# Patient Record
Sex: Female | Born: 1940 | Race: Black or African American | Hispanic: No | Smoking: Never smoker
Health system: Southern US, Community
[De-identification: ages and names within clinical notes are randomized; demographics above are authoritative.]

## PROBLEM LIST (undated history)

## (undated) DIAGNOSIS — E039 Hypothyroidism, unspecified: Secondary | ICD-10-CM

## (undated) DIAGNOSIS — N189 Chronic kidney disease, unspecified: Secondary | ICD-10-CM

## (undated) DIAGNOSIS — F32A Depression, unspecified: Secondary | ICD-10-CM

## (undated) DIAGNOSIS — I10 Essential (primary) hypertension: Secondary | ICD-10-CM

## (undated) DIAGNOSIS — G8929 Other chronic pain: Secondary | ICD-10-CM

## (undated) DIAGNOSIS — K219 Gastro-esophageal reflux disease without esophagitis: Secondary | ICD-10-CM

## (undated) DIAGNOSIS — R011 Cardiac murmur, unspecified: Secondary | ICD-10-CM

## (undated) DIAGNOSIS — E119 Type 2 diabetes mellitus without complications: Secondary | ICD-10-CM

## (undated) DIAGNOSIS — E785 Hyperlipidemia, unspecified: Secondary | ICD-10-CM

## (undated) DIAGNOSIS — M549 Dorsalgia, unspecified: Secondary | ICD-10-CM

## (undated) DIAGNOSIS — H269 Unspecified cataract: Secondary | ICD-10-CM

## (undated) DIAGNOSIS — D649 Anemia, unspecified: Secondary | ICD-10-CM

## (undated) DIAGNOSIS — M1990 Unspecified osteoarthritis, unspecified site: Secondary | ICD-10-CM

## (undated) DIAGNOSIS — F329 Major depressive disorder, single episode, unspecified: Secondary | ICD-10-CM

## (undated) DIAGNOSIS — R7303 Prediabetes: Secondary | ICD-10-CM

## (undated) HISTORY — DX: Type 2 diabetes mellitus without complications: E11.9

## (undated) HISTORY — PX: TOTAL ABDOMINAL HYSTERECTOMY: SHX209

## (undated) HISTORY — DX: Unspecified cataract: H26.9

## (undated) HISTORY — DX: Essential (primary) hypertension: I10

## (undated) HISTORY — DX: Hyperlipidemia, unspecified: E78.5

## (undated) HISTORY — DX: Dorsalgia, unspecified: M54.9

## (undated) HISTORY — DX: Major depressive disorder, single episode, unspecified: F32.9

## (undated) HISTORY — DX: Depression, unspecified: F32.A

## (undated) HISTORY — DX: Anemia, unspecified: D64.9

## (undated) HISTORY — PX: SPINE SURGERY: SHX786

## (undated) HISTORY — DX: Hypothyroidism, unspecified: E03.9

## (undated) HISTORY — DX: Gastro-esophageal reflux disease without esophagitis: K21.9

## (undated) HISTORY — DX: Other chronic pain: G89.29

---

## 1979-02-27 HISTORY — PX: LAMINECTOMY: SHX219

## 1998-10-24 ENCOUNTER — Emergency Department (HOSPITAL_COMMUNITY): Admission: EM | Admit: 1998-10-24 | Discharge: 1998-10-24 | Payer: Self-pay | Admitting: Emergency Medicine

## 1998-10-24 ENCOUNTER — Encounter: Payer: Self-pay | Admitting: Emergency Medicine

## 1998-11-29 ENCOUNTER — Ambulatory Visit (HOSPITAL_COMMUNITY): Admission: RE | Admit: 1998-11-29 | Discharge: 1998-11-29 | Payer: Self-pay

## 1998-11-29 ENCOUNTER — Encounter: Payer: Self-pay | Admitting: Neurosurgery

## 2000-12-23 ENCOUNTER — Ambulatory Visit (HOSPITAL_COMMUNITY): Admission: RE | Admit: 2000-12-23 | Discharge: 2000-12-23 | Payer: Self-pay | Admitting: Family Medicine

## 2000-12-23 ENCOUNTER — Encounter: Payer: Self-pay | Admitting: Family Medicine

## 2001-05-03 ENCOUNTER — Other Ambulatory Visit: Admission: RE | Admit: 2001-05-03 | Discharge: 2001-05-03 | Payer: Self-pay | Admitting: Family Medicine

## 2001-12-25 ENCOUNTER — Encounter: Payer: Self-pay | Admitting: Family Medicine

## 2001-12-25 ENCOUNTER — Ambulatory Visit (HOSPITAL_COMMUNITY): Admission: RE | Admit: 2001-12-25 | Discharge: 2001-12-25 | Payer: Self-pay | Admitting: Family Medicine

## 2002-02-13 ENCOUNTER — Ambulatory Visit (HOSPITAL_COMMUNITY): Admission: RE | Admit: 2002-02-13 | Discharge: 2002-02-13 | Payer: Self-pay | Admitting: Endocrinology

## 2002-02-21 ENCOUNTER — Ambulatory Visit (HOSPITAL_COMMUNITY): Admission: RE | Admit: 2002-02-21 | Discharge: 2002-02-21 | Payer: Self-pay | Admitting: General Surgery

## 2002-03-12 ENCOUNTER — Encounter (HOSPITAL_COMMUNITY): Admission: RE | Admit: 2002-03-12 | Discharge: 2002-04-11 | Payer: Self-pay | Admitting: Endocrinology

## 2002-05-28 ENCOUNTER — Ambulatory Visit (HOSPITAL_COMMUNITY): Admission: RE | Admit: 2002-05-28 | Discharge: 2002-05-28 | Payer: Self-pay | Admitting: Cardiology

## 2002-05-28 ENCOUNTER — Encounter (HOSPITAL_COMMUNITY): Admission: RE | Admit: 2002-05-28 | Discharge: 2002-06-27 | Payer: Self-pay | Admitting: Cardiology

## 2002-10-03 ENCOUNTER — Ambulatory Visit (HOSPITAL_COMMUNITY): Admission: RE | Admit: 2002-10-03 | Discharge: 2002-10-03 | Payer: Self-pay | Admitting: Orthopaedic Surgery

## 2002-10-03 ENCOUNTER — Encounter: Payer: Self-pay | Admitting: Orthopaedic Surgery

## 2002-12-26 ENCOUNTER — Ambulatory Visit (HOSPITAL_COMMUNITY): Admission: RE | Admit: 2002-12-26 | Discharge: 2002-12-26 | Payer: Self-pay | Admitting: Family Medicine

## 2002-12-26 ENCOUNTER — Encounter: Payer: Self-pay | Admitting: Family Medicine

## 2003-12-27 ENCOUNTER — Ambulatory Visit (HOSPITAL_COMMUNITY): Admission: RE | Admit: 2003-12-27 | Discharge: 2003-12-27 | Payer: Self-pay | Admitting: Family Medicine

## 2004-06-10 ENCOUNTER — Ambulatory Visit: Payer: Self-pay | Admitting: Family Medicine

## 2004-10-08 ENCOUNTER — Ambulatory Visit: Payer: Self-pay | Admitting: Family Medicine

## 2005-01-18 ENCOUNTER — Ambulatory Visit: Payer: Self-pay | Admitting: Family Medicine

## 2005-01-21 ENCOUNTER — Ambulatory Visit (HOSPITAL_COMMUNITY): Admission: RE | Admit: 2005-01-21 | Discharge: 2005-01-21 | Payer: Self-pay | Admitting: Family Medicine

## 2005-05-25 ENCOUNTER — Ambulatory Visit: Payer: Self-pay | Admitting: Family Medicine

## 2005-10-12 ENCOUNTER — Ambulatory Visit: Payer: Self-pay | Admitting: Family Medicine

## 2005-10-12 ENCOUNTER — Other Ambulatory Visit: Admission: RE | Admit: 2005-10-12 | Discharge: 2005-10-12 | Payer: Self-pay | Admitting: Family Medicine

## 2005-12-20 ENCOUNTER — Ambulatory Visit: Payer: Self-pay | Admitting: Family Medicine

## 2006-01-18 ENCOUNTER — Ambulatory Visit: Payer: Self-pay | Admitting: Internal Medicine

## 2006-01-24 ENCOUNTER — Ambulatory Visit (HOSPITAL_COMMUNITY): Admission: RE | Admit: 2006-01-24 | Discharge: 2006-01-24 | Payer: Self-pay | Admitting: Family Medicine

## 2006-02-04 ENCOUNTER — Ambulatory Visit: Payer: Self-pay | Admitting: Internal Medicine

## 2006-02-04 ENCOUNTER — Ambulatory Visit (HOSPITAL_COMMUNITY): Admission: RE | Admit: 2006-02-04 | Discharge: 2006-02-04 | Payer: Self-pay | Admitting: Internal Medicine

## 2006-02-07 ENCOUNTER — Ambulatory Visit (HOSPITAL_COMMUNITY): Admission: RE | Admit: 2006-02-07 | Discharge: 2006-02-07 | Payer: Self-pay | Admitting: Internal Medicine

## 2006-04-25 ENCOUNTER — Ambulatory Visit: Payer: Self-pay | Admitting: Family Medicine

## 2006-09-19 ENCOUNTER — Ambulatory Visit: Payer: Self-pay | Admitting: Family Medicine

## 2006-09-28 ENCOUNTER — Ambulatory Visit (HOSPITAL_COMMUNITY): Admission: RE | Admit: 2006-09-28 | Discharge: 2006-09-28 | Payer: Self-pay | Admitting: Family Medicine

## 2007-01-30 ENCOUNTER — Ambulatory Visit (HOSPITAL_COMMUNITY): Admission: RE | Admit: 2007-01-30 | Discharge: 2007-01-30 | Payer: Self-pay | Admitting: Family Medicine

## 2007-03-16 ENCOUNTER — Ambulatory Visit: Payer: Self-pay | Admitting: Family Medicine

## 2007-03-16 ENCOUNTER — Encounter: Payer: Self-pay | Admitting: Family Medicine

## 2007-03-16 ENCOUNTER — Other Ambulatory Visit: Admission: RE | Admit: 2007-03-16 | Discharge: 2007-03-16 | Payer: Self-pay | Admitting: Family Medicine

## 2007-06-28 ENCOUNTER — Ambulatory Visit: Payer: Self-pay | Admitting: Family Medicine

## 2007-06-29 ENCOUNTER — Encounter: Payer: Self-pay | Admitting: Family Medicine

## 2007-11-16 ENCOUNTER — Encounter: Payer: Self-pay | Admitting: Family Medicine

## 2007-11-16 LAB — CONVERTED CEMR LAB
CO2: 21 meq/L (ref 19–32)
Calcium: 9.6 mg/dL (ref 8.4–10.5)
Creatinine, Ser: 1.4 mg/dL — ABNORMAL HIGH (ref 0.40–1.20)
Eosinophils Relative: 5 % (ref 0–5)
Glucose, Bld: 85 mg/dL (ref 70–99)
HCT: 36 % (ref 36.0–46.0)
Hemoglobin: 11.4 g/dL — ABNORMAL LOW (ref 12.0–15.0)
Lymphocytes Relative: 42 % (ref 12–46)
Lymphs Abs: 1.8 10*3/uL (ref 0.7–4.0)
MCV: 76.9 fL — ABNORMAL LOW (ref 78.0–100.0)
Monocytes Absolute: 0.3 10*3/uL (ref 0.1–1.0)
RDW: 16 % — ABNORMAL HIGH (ref 11.5–15.5)
TSH: 5.374 microintl units/mL (ref 0.350–5.50)

## 2007-11-28 ENCOUNTER — Ambulatory Visit: Payer: Self-pay | Admitting: Family Medicine

## 2008-01-31 ENCOUNTER — Encounter: Payer: Self-pay | Admitting: Family Medicine

## 2008-02-20 ENCOUNTER — Ambulatory Visit (HOSPITAL_COMMUNITY): Admission: RE | Admit: 2008-02-20 | Discharge: 2008-02-20 | Payer: Self-pay | Admitting: Family Medicine

## 2008-03-25 ENCOUNTER — Encounter: Payer: Self-pay | Admitting: Family Medicine

## 2008-06-06 ENCOUNTER — Encounter: Payer: Self-pay | Admitting: Family Medicine

## 2008-06-06 ENCOUNTER — Other Ambulatory Visit: Admission: RE | Admit: 2008-06-06 | Discharge: 2008-06-06 | Payer: Self-pay | Admitting: Family Medicine

## 2008-06-06 ENCOUNTER — Ambulatory Visit: Payer: Self-pay | Admitting: Family Medicine

## 2008-06-06 DIAGNOSIS — F32A Depression, unspecified: Secondary | ICD-10-CM | POA: Insufficient documentation

## 2008-06-06 DIAGNOSIS — F329 Major depressive disorder, single episode, unspecified: Secondary | ICD-10-CM

## 2008-06-06 LAB — CONVERTED CEMR LAB: OCCULT 1: NEGATIVE

## 2008-06-14 DIAGNOSIS — I10 Essential (primary) hypertension: Secondary | ICD-10-CM | POA: Insufficient documentation

## 2008-06-14 DIAGNOSIS — E669 Obesity, unspecified: Secondary | ICD-10-CM | POA: Insufficient documentation

## 2008-06-24 ENCOUNTER — Encounter: Payer: Self-pay | Admitting: Family Medicine

## 2008-09-18 ENCOUNTER — Encounter: Payer: Self-pay | Admitting: Family Medicine

## 2008-11-05 ENCOUNTER — Encounter: Payer: Self-pay | Admitting: Family Medicine

## 2008-11-05 DIAGNOSIS — R5381 Other malaise: Secondary | ICD-10-CM | POA: Insufficient documentation

## 2008-11-05 DIAGNOSIS — R5383 Other fatigue: Secondary | ICD-10-CM

## 2008-11-06 ENCOUNTER — Encounter: Payer: Self-pay | Admitting: Family Medicine

## 2008-11-06 LAB — CONVERTED CEMR LAB
Basophils Absolute: 0 10*3/uL (ref 0.0–0.1)
CO2: 20 meq/L (ref 19–32)
Cholesterol: 194 mg/dL (ref 0–200)
Glucose, Bld: 82 mg/dL (ref 70–99)
Hemoglobin: 10.9 g/dL — ABNORMAL LOW (ref 12.0–15.0)
Lymphocytes Relative: 37 % (ref 12–46)
Monocytes Absolute: 0.4 10*3/uL (ref 0.1–1.0)
Monocytes Relative: 8 % (ref 3–12)
Neutro Abs: 2.5 10*3/uL (ref 1.7–7.7)
Potassium: 3.9 meq/L (ref 3.5–5.3)
RBC: 4.5 M/uL (ref 3.87–5.11)
Sodium: 137 meq/L (ref 135–145)
Total CHOL/HDL Ratio: 2.9
VLDL: 10 mg/dL (ref 0–40)
WBC: 5 10*3/uL (ref 4.0–10.5)

## 2008-11-08 LAB — CONVERTED CEMR LAB: Retic Ct Pct: 0.6 % (ref 0.4–3.1)

## 2008-11-11 ENCOUNTER — Ambulatory Visit: Payer: Self-pay | Admitting: Family Medicine

## 2008-11-11 DIAGNOSIS — N189 Chronic kidney disease, unspecified: Secondary | ICD-10-CM

## 2008-11-11 DIAGNOSIS — E041 Nontoxic single thyroid nodule: Secondary | ICD-10-CM | POA: Insufficient documentation

## 2008-11-11 DIAGNOSIS — E039 Hypothyroidism, unspecified: Secondary | ICD-10-CM | POA: Insufficient documentation

## 2008-11-11 DIAGNOSIS — E785 Hyperlipidemia, unspecified: Secondary | ICD-10-CM | POA: Insufficient documentation

## 2008-11-11 DIAGNOSIS — D631 Anemia in chronic kidney disease: Secondary | ICD-10-CM | POA: Insufficient documentation

## 2008-11-11 DIAGNOSIS — R198 Other specified symptoms and signs involving the digestive system and abdomen: Secondary | ICD-10-CM | POA: Insufficient documentation

## 2008-11-13 ENCOUNTER — Ambulatory Visit (HOSPITAL_COMMUNITY): Admission: RE | Admit: 2008-11-13 | Discharge: 2008-11-13 | Payer: Self-pay | Admitting: Family Medicine

## 2008-12-10 ENCOUNTER — Encounter: Payer: Self-pay | Admitting: Family Medicine

## 2008-12-12 ENCOUNTER — Encounter (HOSPITAL_COMMUNITY): Admission: RE | Admit: 2008-12-12 | Discharge: 2009-01-11 | Payer: Self-pay | Admitting: Endocrinology

## 2008-12-26 ENCOUNTER — Encounter: Payer: Self-pay | Admitting: Family Medicine

## 2009-02-06 ENCOUNTER — Encounter: Payer: Self-pay | Admitting: Family Medicine

## 2009-03-17 LAB — CONVERTED CEMR LAB
LDL Cholesterol: 93 mg/dL (ref 0–99)
Total CHOL/HDL Ratio: 2.6

## 2009-03-19 ENCOUNTER — Ambulatory Visit: Payer: Self-pay | Admitting: Family Medicine

## 2009-03-24 ENCOUNTER — Ambulatory Visit (HOSPITAL_COMMUNITY): Admission: RE | Admit: 2009-03-24 | Discharge: 2009-03-24 | Payer: Self-pay | Admitting: Internal Medicine

## 2009-04-09 ENCOUNTER — Encounter: Payer: Self-pay | Admitting: Orthopedic Surgery

## 2009-04-09 ENCOUNTER — Ambulatory Visit (HOSPITAL_COMMUNITY): Admission: RE | Admit: 2009-04-09 | Discharge: 2009-04-09 | Payer: Self-pay | Admitting: Family Medicine

## 2009-04-09 ENCOUNTER — Ambulatory Visit: Payer: Self-pay | Admitting: Family Medicine

## 2009-04-09 DIAGNOSIS — M7989 Other specified soft tissue disorders: Secondary | ICD-10-CM | POA: Insufficient documentation

## 2009-05-01 ENCOUNTER — Ambulatory Visit: Payer: Self-pay | Admitting: Orthopedic Surgery

## 2009-05-01 DIAGNOSIS — M1710 Unilateral primary osteoarthritis, unspecified knee: Secondary | ICD-10-CM

## 2009-05-01 DIAGNOSIS — IMO0002 Reserved for concepts with insufficient information to code with codable children: Secondary | ICD-10-CM | POA: Insufficient documentation

## 2009-05-01 DIAGNOSIS — M7120 Synovial cyst of popliteal space [Baker], unspecified knee: Secondary | ICD-10-CM | POA: Insufficient documentation

## 2009-05-27 ENCOUNTER — Encounter: Payer: Self-pay | Admitting: Family Medicine

## 2009-08-14 ENCOUNTER — Telehealth: Payer: Self-pay | Admitting: Family Medicine

## 2009-08-18 ENCOUNTER — Telehealth: Payer: Self-pay | Admitting: Family Medicine

## 2009-08-18 ENCOUNTER — Ambulatory Visit: Payer: Self-pay | Admitting: Family Medicine

## 2009-08-20 LAB — CONVERTED CEMR LAB
CO2: 22 meq/L (ref 19–32)
Chloride: 103 meq/L (ref 96–112)
HDL: 77 mg/dL (ref >39)
LDL Cholesterol: 94 mg/dL (ref 0–99)
Lymphocytes Relative: 38 % (ref 12–46)
Lymphs Abs: 1.7 10*3/uL (ref 0.7–4.0)
MCV: 83.4 fL (ref 78.0–100.0)
Monocytes Relative: 8 % (ref 3–12)
Neutro Abs: 2.1 10*3/uL (ref 1.7–7.7)
Neutrophils Relative %: 49 % (ref 43–77)
Potassium: 3.8 meq/L (ref 3.5–5.3)
RBC: 4.64 M/uL (ref 3.87–5.11)
Sodium: 137 meq/L (ref 135–145)
TSH: 3.012 microintl units/mL (ref 0.350–4.500)
Total CHOL/HDL Ratio: 2.4
WBC: 4.4 10*3/uL (ref 4.0–10.5)

## 2009-09-04 LAB — CONVERTED CEMR LAB
BUN: 26 mg/dL — ABNORMAL HIGH (ref 6–23)
Calcium: 9.2 mg/dL (ref 8.4–10.5)
Creatinine, Ser: 1.34 mg/dL — ABNORMAL HIGH (ref 0.40–1.20)

## 2009-10-14 ENCOUNTER — Ambulatory Visit: Payer: Self-pay | Admitting: Family Medicine

## 2009-10-14 DIAGNOSIS — J209 Acute bronchitis, unspecified: Secondary | ICD-10-CM | POA: Insufficient documentation

## 2009-10-14 DIAGNOSIS — J019 Acute sinusitis, unspecified: Secondary | ICD-10-CM | POA: Insufficient documentation

## 2010-01-05 ENCOUNTER — Other Ambulatory Visit: Admission: RE | Admit: 2010-01-05 | Discharge: 2010-01-05 | Payer: Self-pay | Admitting: Family Medicine

## 2010-01-05 ENCOUNTER — Ambulatory Visit: Payer: Self-pay | Admitting: Family Medicine

## 2010-01-05 DIAGNOSIS — M949 Disorder of cartilage, unspecified: Secondary | ICD-10-CM

## 2010-01-05 DIAGNOSIS — M899 Disorder of bone, unspecified: Secondary | ICD-10-CM | POA: Insufficient documentation

## 2010-01-05 DIAGNOSIS — E559 Vitamin D deficiency, unspecified: Secondary | ICD-10-CM | POA: Insufficient documentation

## 2010-01-05 DIAGNOSIS — E041 Nontoxic single thyroid nodule: Secondary | ICD-10-CM | POA: Insufficient documentation

## 2010-01-07 LAB — CONVERTED CEMR LAB
Free T4: 1.19 ng/dL (ref 0.80–1.80)
TSH: 1.755 microintl units/mL (ref 0.350–4.500)

## 2010-01-08 ENCOUNTER — Ambulatory Visit (HOSPITAL_COMMUNITY): Admission: RE | Admit: 2010-01-08 | Discharge: 2010-01-08 | Payer: Self-pay | Admitting: Family Medicine

## 2010-01-13 ENCOUNTER — Encounter: Payer: Self-pay | Admitting: Family Medicine

## 2010-03-31 ENCOUNTER — Ambulatory Visit (HOSPITAL_COMMUNITY): Admission: RE | Admit: 2010-03-31 | Discharge: 2010-03-31 | Payer: Self-pay | Admitting: Family Medicine

## 2010-05-12 ENCOUNTER — Ambulatory Visit: Payer: Self-pay | Admitting: Family Medicine

## 2010-05-14 LAB — CONVERTED CEMR LAB
CO2: 25 meq/L (ref 19–32)
Calcium: 9.8 mg/dL (ref 8.4–10.5)
Chloride: 103 meq/L (ref 96–112)
Creatinine, Ser: 1.37 mg/dL — ABNORMAL HIGH (ref 0.40–1.20)
Glucose, Bld: 96 mg/dL (ref 70–99)
Sodium: 136 meq/L (ref 135–145)

## 2010-05-28 ENCOUNTER — Ambulatory Visit: Payer: Self-pay | Admitting: Otolaryngology

## 2010-06-02 ENCOUNTER — Encounter: Payer: Self-pay | Admitting: Family Medicine

## 2010-07-30 NOTE — Assessment & Plan Note (Signed)
Summary: PHY   Vital Signs:  Patient profile:   70 year old female Menstrual status:  hysterectomy Height:      65 inches Weight:      194.75 pounds BMI:     32.53 O2 Sat:      96 % Pulse rate:   87 / minute Pulse rhythm:   regular Resp:     16 per minute BP sitting:   140 / 80  (left arm) Cuff size:   large  Vitals Entered By: Kate Sable LPN (July 11, 624THL 624THL AM)  Nutrition Counseling: Patient's BMI is greater than 25 and therefore counseled on weight management options. CC: CPE  Vision Screening:Left eye with correction: 41 / 25 Right eye with correction: 46 / 25 Both eyes with correction: 20 / 20  Color vision testing: normal      Vision Entered By: Kate Sable LPN (July 11, 624THL [id number] AM)   CC:  CPE.  History of Present Illness: Pt reports that the last 2 to 3 months in particular have been extremely rough on her. One of her sons was arrested for DUI and is battling alcoholism. She does have family support in dealing with this, and overall things are improving for her. It clearly has token a toll on her emotionally, mentally and physically however. She still tears up talking about it, and states she has been unable to focus on her health , eating and exercise due to stress, she has gained weight . Reports  that otherwise she has no new concerns . Denies recent fever or chills. Denies sinus pressure, nasal congestion , ear pain or sore throat. Denies chest congestion, or cough productive of sputum. Denies chest pain, palpitations, PND, orthopnea or leg swelling. Denies abdominal pain, nausea, vomitting, diarrhea or constipation. Denies change in bowel movements or bloody stool. Denies dysuria , frequency, incontinence or hesitancy. Denies  joint pain, swelling, or reduced mobility. Denies headaches, vertigo, seizures.  Denies  rash, lesions, or itch.      Current Medications (verified): 1)  Amlodipine Besy-Benazepril Hcl 10-20 Mg  Caps (Amlodipine  Besy-Benazepril Hcl) .Marland Kitchen.. 1 By Mouth Once Daily 2)  Budeprion Sr 150 Mg Xr12h-Tab (Bupropion Hcl) .... Take One Tablet By Mouth Twice A Day 3)  Tramadol Hcl 50 Mg Tabs (Tramadol Hcl) .... One To Two Tabs By Mouth Every 4 Hours Prn 4)  Calcium Carbonate 600 Mg Tabs (Calcium Carbonate) .... Take 1 Tablet By Mouth Two Times A Day 5)  Adult Aspirin Ec Low Strength 81 Mg Tbec (Aspirin) .... Take 1 Tablet By Mouth Once A Day 6)  Hydrochlorothiazide 25 Mg Tabs (Hydrochlorothiazide) .... Take 1 Tablet By Mouth Once A Day 7)  Omeprazole 20 Mg Cpdr (Omeprazole) .... Take 1 Tablet By Mouth Two Times A Day 8)  Synthroid 50 Mcg Tabs (Levothyroxine Sodium) .... One Tab By Mouth Qd 9)  One-A-Day Extras Antioxidant  Caps (Multiple Vitamins-Minerals) .... Take 1 Tablet By Mouth Once A Day 10)  Ferrous Sulfate 325 (65 Fe) Mg Tabs (Ferrous Sulfate) .... Take 1 Tablet By Mouth Once A Day 11)  Vitamin D (Ergocalciferol) 50000 Unit Caps (Ergocalciferol) .... One Cap By Mouth Q Week  Allergies (verified): No Known Drug Allergies  Review of Systems      See HPI General:  Complains of fatigue, malaise, and sleep disorder. Eyes:  Denies discharge, eye pain, and red eye. Psych:  Complains of anxiety, depression, and easily tearful; denies sense of great danger, suicidal thoughts/plans, and  thoughts of violence. Endo:  Denies cold intolerance, excessive hunger, excessive thirst, excessive urination, heat intolerance, polyuria, and weight change. Heme:  Denies abnormal bruising and bleeding. Allergy:  Complains of seasonal allergies.  Physical Exam  General:  Well-developed,well-nourished,in no acute distress; alert,appropriate and cooperative throughout examination Head:  Normocephalic and atraumatic without obvious abnormalities. No apparent alopecia or balding. Eyes:  No corneal or conjunctival inflammation noted. EOMI. Perrla. Funduscopic exam benign, . Vision grossly normal. Ears:  External ear exam shows no  significant lesions or deformities.  Otoscopic examination reveals clear canals, tympanic membranes are intact bilaterally without bulging, retraction, inflammation or discharge. Hearing is grossly normal bilaterally. Nose:  External nasal examination shows no deformity or inflammation. Nasal mucosa are pink and moist without lesions or exudates. Mouth:  pharynx pink and moist and fair dentition.   Neck:  No deformities, masses, or tenderness noted. Chest Wall:  No deformities, masses, or tenderness noted. Breasts:  No mass, nodules, thickening, tenderness, bulging, retraction, inflamation, nipple discharge or skin changes noted.   Lungs:  Normal respiratory effort, chest expands symmetrically. Lungs are clear to auscultation, no crackles or wheezes. Heart:  Normal rate and regular rhythm. S1 and S2 normal without gallop, murmur, click, rub or other extra sounds. Abdomen:  Bowel sounds positive,abdomen soft and non-tender without masses, organomegaly or hernias noted. Rectal:  No external abnormalities noted. Normal sphincter tone. hemmorhoid external, Guaic neg stool Genitalia:  normal introitus and no adnexal masses or tenderness.  Vaginal atrophy Msk:  No deformity or scoliosis noted of thoracic or lumbar spine.   Pulses:  R and L carotid,radial,femoral,dorsalis pedis and posterior tibial pulses are full and equal bilaterally Extremities:  No clubbing, cyanosis, edema, or deformity noted with normal full range of motion of all joints.   Neurologic:  No cranial nerve deficits noted. Station and gait are normal. Plantar reflexes are down-going bilaterally. DTRs are symmetrical throughout. Sensory, motor and coordinative functions appear intact. Skin:  Intact without suspicious lesions or rashes Cervical Nodes:  No lymphadenopathy noted Axillary Nodes:  No palpable lymphadenopathy Inguinal Nodes:  No significant adenopathy Psych:  Cognition and judgment appear intact. Alert and cooperative with  normal attention span and concentration. 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 One tab by mouth qd  Orders: T-TSH [id number]) T-Thyroid Binding Globulin [phone]) T-T3, Free [id number]) T-T4, Free [id number])  Labs Reviewed: TSH: 3.012 (08/19/2009)    Chol: 183 (08/19/2009)   HDL: 77 (08/19/2009)   LDL: 94 (08/19/2009)   TG: 58 (08/19/2009)  Problem # 5:  DYSLIPIDEMIA (ICD-272.4) Assessment: Comment Only    HDL:77 (08/19/2009), 65 (03/17/2009)  LDL:94 (08/19/2009), 93 (03/17/2009)  Chol:183 (08/19/2009), 166 (03/17/2009)  Trig:58 (08/19/2009), 42 (03/17/2009)  Problem # 6:  HYPERTENSION (ICD-401.9) Assessment: Unchanged  Her updated medication list for this problem includes:    Amlodipine Besy-benazepril Hcl 10-20 Mg Caps (Amlodipine besy-benazepril hcl) .Marland Kitchen... 1 by mouth once daily    Hydrochlorothiazide 25 Mg Tabs (Hydrochlorothiazide) .Marland Kitchen... Take 1 tablet by mouth  once a day  BP today: 140/80 Prior BP: 140/70 (10/14/2009), low sodium diet , regular exercise and weight loss encouraged, no med change at this time  Labs Reviewed: K+: 3.8 (09/03/2009) Creat: : 1.34 (09/03/2009)   Chol: 183 (08/19/2009)   HDL: 77 (08/19/2009)   LDL: 94 (08/19/2009)   TG: 58 (08/19/2009)  Complete Medication List: 1)  Amlodipine Besy-benazepril Hcl 10-20 Mg Caps (Amlodipine  besy-benazepril hcl) .Marland Kitchen.. 1 by mouth once daily 2)  Budeprion Sr 150 Mg Xr12h-tab (Bupropion hcl) .... Take one tablet by mouth twice a day 3)  Tramadol Hcl 50 Mg Tabs (Tramadol hcl) .... One to two tabs by mouth every 4 hours prn 4)  Calcium Carbonate 600 Mg Tabs (Calcium carbonate) .... Take 1 tablet by mouth two times a day 5)  Adult Aspirin Ec Low Strength 81 Mg Tbec (Aspirin) .... Take 1 tablet by mouth once a day 6)  Hydrochlorothiazide 25 Mg Tabs (Hydrochlorothiazide) .... Take 1 tablet by mouth once a day 7)  Omeprazole 20 Mg Cpdr (Omeprazole) .... Take 1 tablet by mouth two times a day 8)  Synthroid 50 Mcg Tabs (Levothyroxine sodium) .... One tab by mouth qd 9)  One-a-day Extras Antioxidant Caps (Multiple vitamins-minerals) .... Take 1 tablet by mouth once a day 10)  Ferrous Sulfate 325 (65 Fe) Mg Tabs (Ferrous sulfate) .... Take 1 tablet by mouth once a day 11)  Vitamin D (ergocalciferol) 50000 Unit Caps (Ergocalciferol) .... One cap by mouth q week  Other Orders: T-Vitamin D (25-Hydroxy) [id number])  Patient Instructions: 1)  Please schedule a follow-up appointment in 4 months. 2)  It is important that you exercise regularly at least 20 minutes 5 times a week. If you develop chest pain, have severe difficulty breathing, or feel very tired , stop exercising immediately and seek medical attention. 3)  You need to lose weight. Consider a lower calorie diet and regular exercise.  4)  Labs today as discussed. 5)  Pls sched your mamo , it is due. 6)  pls sched your eye exam 7)  You will be referrd fro an [place] of your thyroid gland   Laboratory Results    Stool - Occult Blood Hemmoccult #1: negative Date: 01/05/2010 Comments: [id number] 8/11 118 [id number]

## 2010-07-30 NOTE — Progress Notes (Signed)
Summary: [facility]  Phone Note Call from Patient   Summary of Call: LAB WORK CAN NOT BE FOUND WILL GO IN THE MORNING TO [facility] JUST WANTED TO LET [place] Initial call taken by: Dierdre Harness,  August 18, 2009 2:33 PM  Follow-up for Phone Call        noted  Follow-up by: Baldomero Lamy LPN,  February 21, 624THL 2:40 PM

## 2010-07-30 NOTE — Medication Information (Signed)
Summary: RX Folder  RX Folder   Imported By: Ihor Austin 10/18/2009 12:49:10  _____________________________________________________________________  External Attachment:    Type:   Image     Comment:   External Document

## 2010-07-30 NOTE — Letter (Signed)
Summary: external others  external others   Imported By: Eliezer Mccoy 02/17/2010 08:30:35  _____________________________________________________________________  External Attachment:    Type:   Image     Comment:   External Document

## 2010-07-30 NOTE — Assessment & Plan Note (Signed)
Summary: sick- room 1   Vital Signs:  Patient profile:   70 year old female Menstrual status:  hysterectomy Height:      65 inches Weight:      190 pounds BMI:     31.73 O2 Sat:      98 % on Room air Pulse rate:   93 / minute Resp:     16 per minute BP sitting:   140 / 70  (left arm)  Vitals Entered By: Baldomero Lamy LPN (April 19, 624THL [id number] AM) CC: chills, head congestion, x 5 days Is Patient Diabetic? No Pain Assessment Patient in pain? no        Referring Provider:  Dr Moshe Cipro   CC:  chills, head congestion, and x 5 days.  History of Present Illness: This 71 Years Old Black Female comes in today with complaints of cough, and runny nose x 6 days.  She states that her congestion seemed to improve some but is now worsening. She has tried an otc cold medicine which does help.  She is sleeping well, the cough is not disrupting her sleep.  She has yellow nasal mucus.  No sore throat or ear pain.  She did miss 3 days of work last wk due to this , but feels that she is able to work now & can't afford to miss anymore time.  Hx of htn. Taking medications as prescribed. No headache, chest pain or palpitations.     Allergies (verified): No Known Drug Allergies  Past History:  Past medical history reviewed for relevance to current acute and chronic problems.  Past Medical History: Reviewed history from 05/01/2009 and no changes required. HTn  x 13 years Depression Chronic back pain Anemia Hypothyroidism  Review of Systems General:  Denies chills and fever. ENT:  Complains of nasal congestion, postnasal drainage, and sinus pressure; denies earache and sore throat. CV:  Denies chest pain or discomfort and palpitations. Resp:  Complains of cough; denies shortness of breath and sputum productive.  Physical Exam  General:  Well-developed,well-nourished,in no acute distress; alert,appropriate and cooperative throughout examination Head:  Normocephalic and atraumatic  without obvious abnormalities. No apparent alopecia or balding. Ears:  External ear exam shows no significant lesions or deformities.  Otoscopic examination reveals clear canals, tympanic membranes are intact bilaterally without bulging, retraction, inflammation or discharge. Hearing is grossly normal bilaterally. Nose:  External nasal examination shows no deformity or inflammation. Nasal mucosa are pink and moist without lesions or exudates.no sinus percussion tenderness.   Mouth:  Oral mucosa and oropharynx without lesions or exudates.   Neck:  No deformities, masses, or tenderness noted. Lungs:  Normal respiratory effort, chest expands symmetrically. Lungs are clear to auscultation, no crackles or wheezes.  Harsh deep cough noted Heart:  Normal rate and regular rhythm. S1 and S2 normal without gallop, murmur, click, rub or other extra sounds. Cervical Nodes:  No lymphadenopathy noted Psych:  Cognition and judgment appear intact. Alert and cooperative with normal attention span and concentration. No apparent delusions, illusions, hallucinations   Impression & Recommendations:  Problem # 1:  SINUSITIS, ACUTE (ICD-461.9) Assessment New  Her updated medication list for this problem includes:    Zithromax Z-pak 250 Mg Tabs (Azithromycin) .Marland Kitchen... Take once daily as directed  Problem # 2:  ACUTE BRONCHITIS (ICD-466.0) Assessment: New Pt declined prescription cough med today.  Will continue using her otc cold med as needed.  Her updated medication list for this problem includes:    Zithromax Z-pak  250 Mg Tabs (Azithromycin) .Marland Kitchen... Take once daily as directed  Problem # 3:  HYPERTENSION (ICD-401.9) Assessment: Comment Only  Her updated medication list for this problem includes:    Amlodipine Besy-benazepril Hcl 10-20 Mg Caps (Amlodipine besy-benazepril hcl) .Marland Kitchen... 1 by mouth once daily    Hydrochlorothiazide 25 Mg Tabs (Hydrochlorothiazide) .Marland Kitchen... Take 1 tablet by mouth once a day  BP today:  140/70 Prior BP: 130/82 (08/18/2009)  Labs Reviewed: K+: 3.8 (09/03/2009) Creat: : 1.34 (09/03/2009)   Chol: 183 (08/19/2009)   HDL: 77 (08/19/2009)   LDL: 94 (08/19/2009)   TG: 58 (08/19/2009)  Complete Medication List: 1)  Amlodipine Besy-benazepril Hcl 10-20 Mg Caps (Amlodipine besy-benazepril hcl) .Marland Kitchen.. 1 by mouth once daily 2)  Budeprion Sr 150 Mg Xr12h-tab (Bupropion hcl) .... Take one tablet by mouth twice a day 3)  Tramadol Hcl 50 Mg Tabs (Tramadol hcl) .... One to two tabs by mouth every 4 hours prn 4)  Calcium Carbonate 600 Mg Tabs (Calcium carbonate) .... Take 1 tablet by mouth two times a day 5)  Adult Aspirin Ec Low Strength 81 Mg Tbec (Aspirin) .... Take 1 tablet by mouth once a day 6)  Hydrochlorothiazide 25 Mg Tabs (Hydrochlorothiazide) .... Take 1 tablet by mouth once a day 7)  Omeprazole 20 Mg Cpdr (Omeprazole) .... Take 1 tablet by mouth two times a day 8)  Synthroid 50 Mcg Tabs (Levothyroxine sodium) .... One tab by mouth qd 9)  One-a-day Extras Antioxidant Caps (Multiple vitamins-minerals) .... Take 1 tablet by mouth once a day 10)  Ferrous Sulfate 325 (65 Fe) Mg Tabs (Ferrous sulfate) .... Take 1 tablet by mouth once a day 11)  Vitamin D (ergocalciferol) 50000 Unit Caps (Ergocalciferol) .... One cap by mouth q week 12)  Zithromax Z-pak 250 Mg Tabs (Azithromycin) .... Take once daily as directed  Patient Instructions: 1)  Please schedule a follow-up appointment as needed. 2)  Get plenty of rest, drink lots of clear liquids, and use Tylenol or Ibuprofen for fever and comfort. Return in 7-10 days if you're not better:sooner if you're feeling worse. 3)  Continue the current cold medication that you are taking. 4)  I have prescribed an antibiotic for you. Prescriptions: ZITHROMAX Z-PAK 250 MG TABS (AZITHROMYCIN) take once daily as directed  #1 pack x 0   Entered and Authorized by:   Kennith Gain PA   Signed by:   Kennith Gain PA on 10/14/2009   Method used:    Electronically to        [facility] (retail)       [facility]       [place], [place]  [address]       Ph: [phone]       Fax: [phone]   RxID:   [id number]

## 2010-07-30 NOTE — Letter (Signed)
Summary: ent  ent   Imported By: Dierdre Harness 06/03/2010 11:09:22  _____________________________________________________________________  External Attachment:    Type:   Image     Comment:   External Document

## 2010-07-30 NOTE — Progress Notes (Signed)
Summary: LAB ORDER  Phone Note Call from Patient   Summary of Call: WANTS  YOU TO FAX OVER LABS SO SHE CAN GO DO THEM Initial call taken by: Dierdre Harness,  August 14, 2009 11:13 AM  Follow-up for Phone Call        what labs would you like this patient to have? Follow-up by: Baldomero Lamy LPN,  February 17, 624THL 1:25 PM  Additional Follow-up for Phone Call Additional follow up Details #1::        Please order: CBC, BMP, TSH, Lipids, Vit D Additional Follow-up by: Kennith Gain PA,  August 14, 2009 4:00 PM    Additional Follow-up for Phone Call Additional follow up Details #2::    order sent to lab Follow-up by: Baldomero Lamy LPN,  February 18, 624THL 12:03 PM

## 2010-07-30 NOTE — Assessment & Plan Note (Signed)
Summary: office visit   Vital Signs:  Patient profile:   70 year old female Menstrual status:  hysterectomy Height:      65 inches Weight:      183.50 pounds BMI:     30.65 O2 Sat:      98 % Pulse rate:   96 / minute Pulse rhythm:   regular Resp:     16 per minute BP sitting:   130 / 82  (left arm) Cuff size:   large  Vitals Entered By: Kate Sable LPN (February 21, 624THL 10:44 AM)  Nutrition Counseling: Patient's BMI is greater than 25 and therefore counseled on weight management options. CC: Follow up chronic problems Is Patient Diabetic? No   CC:  Follow up chronic problems.  History of Present Illness: Reports  that tshe is  doing well. Denies recent fever or chills. Denies sinus pressure, nasal congestion , ear pain or sore throat. Denies chest congestion, or cough productive of sputum. Denies chest pain, palpitations, PND, orthopnea or leg swelling. Denies abdominal pain, nausea, vomitting, diarrhea or constipation. Denies change in bowel movements or bloody stool. Denies dysuria , frequency, incontinence or hesitancy. Denies  joint pain, swelling, or reduced mobility. Denies headaches, vertigo, seizures. Denies depression, anxiety or insomnia. Denies  rash, lesions, or itch. Renee Collins is committed to regular exercise and healthy diet, and is losing weight very nicely.     Current Medications (verified): 1)  Amlodipine Besy-Benazepril Hcl 10-20 Mg  Caps (Amlodipine Besy-Benazepril Hcl) .Marland Kitchen.. 1 By Mouth Once Daily 2)  Budeprion Sr 150 Mg Xr12h-Tab (Bupropion Hcl) .... Take One Tablet By Mouth Twice A Day 3)  Tramadol Hcl 50 Mg Tabs (Tramadol Hcl) .... One To Two Tabs By Mouth Every 4 Hours Prn 4)  Calcium Carbonate 600 Mg Tabs (Calcium Carbonate) .... Take 1 Tablet By Mouth Two Times A Day 5)  Adult Aspirin Ec Low Strength 81 Mg Tbec (Aspirin) .... Take 1 Tablet By Mouth Once A Day 6)  Hydrochlorothiazide 25 Mg Tabs (Hydrochlorothiazide) .... Take 1 Tablet By Mouth  Once A Day 7)  Omeprazole 20 Mg Cpdr (Omeprazole) .... Take 1 Tablet By Mouth Two Times A Day 8)  Synthroid 50 Mcg Tabs (Levothyroxine Sodium) .... One Tab By Mouth Qd 9)  One-A-Day Extras Antioxidant  Caps (Multiple Vitamins-Minerals) .... Take 1 Tablet By Mouth Once A Day 10)  Ferrous Sulfate 325 (65 Fe) Mg Tabs (Ferrous Sulfate) .... Take 1 Tablet By Mouth Once A Day 11)  Vitamin D (Ergocalciferol) 50000 Unit Caps (Ergocalciferol) .... One Cap By Mouth Q Week  Allergies (verified): No Known Drug Allergies  Review of Systems      See HPI Eyes:  Denies blurring, discharge, and red eye. Endo:  Denies cold intolerance, excessive hunger, excessive thirst, excessive urination, heat intolerance, polyuria, and weight change. Heme:  Denies abnormal bruising. Allergy:  Complains of seasonal allergies; denies hives or rash and itching eyes.  Physical Exam  General:  alert, well-hydrated, and overweight-appearing.  HEENT: No facial asymmetry,  EOMI, No sinus tenderness, TM's Clear, oropharynx  pink and moist.   Chest: Clear to auscultation bilaterally.  CVS: S1, S2, No murmurs, No S3.   Abd: Soft, Nontender.  MS: Adequate ROM spine, hips, shoulders and knees. left calf tender and swollen,15 inches on the right and 15.5 inches left Ext: No edema.   CNS: CN 2-12 intact, power tone and sensation normal throughout.   Skin: Intact, no visible lesions or rashes.  Psych: Good eye  contact, normal affect.  Memory intact, not anxious or depressed appearing.      Impression & Recommendations:  Problem # 1:  UNSPECIFIED HYPOTHYROIDISM (ICD-244.9) Assessment Comment Only  Her updated medication list for this problem includes:    Synthroid 50 Mcg Tabs (Levothyroxine sodium) ..... One tab by mouth qd followed by endo  Problem # 2:  OBESITY, UNSPECIFIED (ICD-278.00) Assessment: Improved  Ht: 65 (08/18/2009)   Wt: 183.50 (08/18/2009)   BMI: 30.65 (08/18/2009)  Problem # 3:  DEPRESSION, CHRONIC  (ICD-311) Assessment: Improved  Her updated medication list for this problem includes:    Budeprion Sr 150 Mg Xr12h-tab (Bupropion hcl) .Marland Kitchen... Take one tablet by mouth twice a day  Problem # 4:  HYPERTENSION (ICD-401.9) Assessment: Unchanged  Her updated medication list for this problem includes:    Amlodipine Besy-benazepril Hcl 10-20 Mg Caps (Amlodipine besy-benazepril hcl) .Marland Kitchen... 1 by mouth once daily    Hydrochlorothiazide 25 Mg Tabs (Hydrochlorothiazide) .Marland Kitchen... Take 1 tablet by mouth once a day  BP today: 130/82 Prior BP: 120/80 (04/09/2009)  Labs Reviewed: K+: 3.9 (11/05/2008) Creat: : 1.21 (11/05/2008)   Chol: 166 (03/17/2009)   HDL: 65 (03/17/2009)   LDL: 93 (03/17/2009)   TG: 42 (03/17/2009)  Complete Medication List: 1)  Amlodipine Besy-benazepril Hcl 10-20 Mg Caps (Amlodipine besy-benazepril hcl) .Marland Kitchen.. 1 by mouth once daily 2)  Budeprion Sr 150 Mg Xr12h-tab (Bupropion hcl) .... Take one tablet by mouth twice a day 3)  Tramadol Hcl 50 Mg Tabs (Tramadol hcl) .... One to two tabs by mouth every 4 hours prn 4)  Calcium Carbonate 600 Mg Tabs (Calcium carbonate) .... Take 1 tablet by mouth two times a day 5)  Adult Aspirin Ec Low Strength 81 Mg Tbec (Aspirin) .... Take 1 tablet by mouth once a day 6)  Hydrochlorothiazide 25 Mg Tabs (Hydrochlorothiazide) .... Take 1 tablet by mouth once a day 7)  Omeprazole 20 Mg Cpdr (Omeprazole) .... Take 1 tablet by mouth two times a day 8)  Synthroid 50 Mcg Tabs (Levothyroxine sodium) .... One tab by mouth qd 9)  One-a-day Extras Antioxidant Caps (Multiple vitamins-minerals) .... Take 1 tablet by mouth once a day 10)  Ferrous Sulfate 325 (65 Fe) Mg Tabs (Ferrous sulfate) .... Take 1 tablet by mouth once a day 11)  Vitamin D (ergocalciferol) 50000 Unit Caps (Ergocalciferol) .... One cap by mouth q week  Patient Instructions: 1)  Please schedule a follow-up appointment in 4.5 months. 2)  It is important that you exercise regularly at least 20  minutes 5 times a week. If you develop chest pain, have severe difficulty breathing, or feel very tired , stop exercising immediately and seek medical attention. 3)  You need to lose weight. Consider a lower calorie diet and regular exercise. Congrats , keep it up. 4)  BP is excellent, no med changes Prescriptions: SYNTHROID 50 MCG TABS (LEVOTHYROXINE SODIUM) one tab by mouth qd  #90 x 3   Entered by:   Kate Sable LPN   Authorized by:   [name] MD   Signed by:   Kate Sable LPN on D34-534   Method used:   Electronically to        [facility] (retail)       [place] [address] [place], [place]  [address]       Ph: [phone]       Fax: [phone]   RxID:  [id number] OMEPRAZOLE 20 MG CPDR (OMEPRAZOLE) Take 1 tablet by mouth two times a day  #180 x 3   Entered by:   Kate Sable LPN   Authorized by:   [name] MD   Signed by:   Kate Sable LPN on D34-534   Method used:   Electronically to        [facility] (retail)       [place] [address] [place], [place]  [address]       Ph: [phone]       Fax: [phone]   [id number] HYDROCHLOROTHIAZIDE 25 MG TABS (HYDROCHLOROTHIAZIDE) Take 1 tablet by mouth once a day  #90 Each x 3   Entered by:   Kate Sable LPN   Authorized by:   [name] MD   Signed by:   Kate Sable LPN on D34-534   Method used:   Electronically to        [facility] (retail)       [place] [address]       [place], [place]  [address]       Ph: [phone]       Fax: [phone]   [id number] BUDEPRION SR 150 MG XR12H-TAB (BUPROPION HCL) Take one tablet by mouth twice a day  #180 x 3   Entered by:   Kate Sable LPN   Authorized by:   [name] MD   Signed by:   Kate Sable LPN on D34-534   Method used:   Electronically to        [facility] (retail)       [place]  [address]       [place], [place]  [address]       Ph: [phone]       Fax: [phone]   [id number] AMLODIPINE BESY-BENAZEPRIL HCL 10-20 MG  CAPS (AMLODIPINE BESY-BENAZEPRIL HCL) 1 by mouth once daily  #90 x 3   Entered by:   Kate Sable LPN   Authorized by:   [name] MD   Signed by:   Kate Sable LPN on D34-534   Method used:   Electronically to        [facility] (retail)       [place] [address] [place], [place]  [address]       Ph: [phone]       Fax: [phone]   RxID:   [id number]

## 2010-07-30 NOTE — Assessment & Plan Note (Signed)
Summary: office visit   Vital Signs:  Patient profile:   70 year old female Menstrual status:  hysterectomy Height:      65 inches Weight:      189.75 pounds BMI:     31.69 O2 Sat:      98 % on Room air Pulse rate:   98 / minute Pulse rhythm:   regular Resp:     16 per minute BP sitting:   130 / 80  (left arm)  Vitals Entered By: Louie Casa CMA (May 12, 2010 8:53 AM)  Nutrition Counseling: Patient's BMI is greater than 25 and therefore counseled on weight management options.  O2 Flow:  Room air CC: Follow up   Primary Care Provider:  [name] MD  CC:  Follow up.  History of Present Illness: Reports  that she ahs been  doing well. Denies recent fever or chills. Denies sinus pressure, nasal congestion , ear pain or sore throat. Denies chest congestion, or cough productive of sputum. Denies chest pain, palpitations, PND, orthopnea or leg swelling. Denies abdominal pain, nausea, vomitting, diarrhea or constipation. Denies change in bowel movements or bloody stool. Denies dysuria , frequency, incontinence or hesitancy. Denies  joint pain, swelling, or reduced mobility. Denies headaches, vertigo, seizures. Denies depression, anxiety or insomnia. Denies  rash, lesions, or itch.     Current Medications (verified): 1)  Amlodipine Besy-Benazepril Hcl 10-20 Mg  Caps (Amlodipine Besy-Benazepril Hcl) .Marland Kitchen.. 1 By Mouth Once Daily 2)  Budeprion Sr 150 Mg Xr12h-Tab (Bupropion Hcl) .... Take One Tablet By Mouth Once A Day 3)  Tramadol Hcl 50 Mg Tabs (Tramadol Hcl) .... One To Two Tabs By Mouth Every 4 Hours Prn 4)  Adult Aspirin Ec Low Strength 81 Mg Tbec (Aspirin) .... Take 1 Tablet By Mouth Once A Day 5)  Hydrochlorothiazide 25 Mg Tabs (Hydrochlorothiazide) .... Take 1 Tablet By Mouth Once A Day 6)  Omeprazole 20 Mg Cpdr (Omeprazole) .... Take 1 Tablet By Mouth Two Times A Day 7)  Synthroid 50 Mcg Tabs (Levothyroxine Sodium) .... One Tab By Mouth Once Daily. 8)   One-A-Day Extras Antioxidant  Caps (Multiple Vitamins-Minerals) .... Take 1 Tablet By Mouth Once A Day 9)  Ferrous Sulfate 325 (65 Fe) Mg Tabs (Ferrous Sulfate) .... Take 1 Tablet By Mouth Once A Day 10)  Vitamin D (Ergocalciferol) 50000 Unit Caps (Ergocalciferol) .... One Cap By Mouth Q Week 11)  Calcium 1200 Mg + Vitamin D 1000 Iu .Marland Kitchen.. 1 Cap Daily  Allergies (verified): No Known Drug Allergies  Review of Systems      See HPI Eyes:  Complains of vision loss-both eyes; denies discharge, eye irritation, eye pain, and red eye; wears corrective lenses. Endo:  Denies cold intolerance, excessive hunger, excessive thirst, and excessive urination. Heme:  Denies abnormal bruising and bleeding. Allergy:  Denies hives or rash and itching eyes.  Physical Exam  General:  Well-developed,well-nourished,in no acute distress; alert,appropriate and cooperative throughout examination HEENT: No facial asymmetry,  EOMI, No sinus tenderness, TM's Clear, oropharynx  pink and moist.   Chest: Clear to auscultation bilaterally.  CVS: S1, S2, No murmurs, No S3.   Abd: Soft, Nontender.  MS: Adequate ROM spine, hips, shoulders and knees.  Ext: No edema.   CNS: CN 2-12 intact, power tone and sensation normal throughout.   Skin: Intact, no visible lesions or rashes.  Psych: Good eye contact, normal affect.  Memory intact, not anxious or depressed appearing.    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 One tab by mouth once daily.  Labs Reviewed: TSH: 1.755 (01/05/2010)    Chol: 183 (08/19/2009)   HDL: 77 (08/19/2009)   LDL: 94 (08/19/2009)   TG: 58 (08/19/2009)  Problem # 3:  HYPERTENSION  (ICD-401.9) Assessment: Improved  Her updated medication list for this problem includes:    Amlodipine Besy-benazepril Hcl 10-20 Mg Caps (Amlodipine besy-benazepril hcl) .Marland Kitchen... 1 by mouth once daily    Hydrochlorothiazide 25 Mg Tabs (Hydrochlorothiazide) .Marland Kitchen... Take 1 tablet by mouth once a day  Orders: T-Basic Metabolic Panel ([id number]) T-Basic Metabolic Panel (99991111)  BP today: 130/80 Prior BP: 140/80 (01/05/2010)  Labs Reviewed: K+: 3.8 (09/03/2009) Creat: : 1.34 (09/03/2009)   Chol: 183 (08/19/2009)   HDL: 77 (08/19/2009)   LDL: 94 (08/19/2009)   TG: 58 (08/19/2009)  Problem # 4:  DEPRESSION, CHRONIC (ICD-311) Assessment: Improved  Her updated medication list for this problem includes:    Budeprion Sr 150 Mg Xr12h-tab (Bupropion hcl) .Marland Kitchen... Take one tablet by mouth once a day  Complete Medication List: 1)  Amlodipine Besy-benazepril Hcl 10-20 Mg Caps (Amlodipine besy-benazepril hcl) .Marland Kitchen.. 1 by mouth once daily 2)  Budeprion Sr 150 Mg Xr12h-tab (Bupropion hcl) .... Take one tablet by mouth once a day 3)  Tramadol Hcl 50 Mg Tabs (Tramadol hcl) .... One to two tabs by mouth every 4 hours prn 4)  Adult Aspirin Ec Low Strength 81 Mg Tbec (Aspirin) .... Take 1 tablet by mouth once a day 5)  Hydrochlorothiazide 25 Mg Tabs (Hydrochlorothiazide) .... Take 1 tablet by mouth once a day 6)  Omeprazole 20 Mg Cpdr (Omeprazole) .... Take 1 tablet by mouth two times a day 7)  Synthroid 50 Mcg Tabs (Levothyroxine sodium) .... One tab by mouth once daily. 8)  One-a-day Extras Antioxidant Caps (Multiple vitamins-minerals) .... Take 1 tablet by mouth once a day 9)  Ferrous Sulfate 325 (65 Fe) Mg Tabs (Ferrous sulfate) .... Take 1 tablet by mouth once a day 10)  Vitamin D (ergocalciferol) 50000 Unit Caps (Ergocalciferol) .... One cap by mouth q week 11)  Calcium 1200 Mg + Vitamin D 1000 Iu  .Marland Kitchen.. 1 cap daily 12)  Flonase 50 Mcg/act Susp (Fluticasone propionate) .... One puff per nostril once  daily  Other Orders: T-Vitamin D (25-Hydroxy) [phone]) T-Lipid Profile [phone]) T-CBC w/Diff [id number])  Patient Instructions: 1)  Please schedule a follow-up appointment in 4.21months. 2)  It is important that you exercise regularly at least 20 minutes 5 times a week. If you develop chest pain, have severe difficulty breathing, or feel very tired , stop exercising immediately and seek medical attention. 3)  You need to lose weight. Consider a lower calorie diet and regular exercise.  4)  BMP prior to visit, ICD-9:, Vitamin D 5)  TSH prior to visit, ICD-9:  labs today 6)  pls get flu vac a t work 7)  You will be referred to eNT 8)  BMP prior to visit, ICD-9: 9)  Lipid Panel prior to visit, ICD-9:  fasting in 4.8months 10)  TSH prior to visit, ICD-9: 11)  CBC w/ Diff prior to visit, ICD-9: Prescriptions: HYDROCHLOROTHIAZIDE 25 MG TABS (HYDROCHLOROTHIAZIDE) Take 1 tablet by mouth once a day  #90 Each x 0  Entered by:   Baldomero Lamy LPN   Authorized by:   [name] MD   Signed by:   Baldomero Lamy LPN on [id number]   Method used:   Electronically to        [facility] (retail)       [place] [address] [place], [place]  [address]       Ph: [phone]       Fax: [phone]   RxID:   [id number] FLONASE 50 MCG/ACT SUSP (FLUTICASONE PROPIONATE) one puff per nostril once daily  #1 x 3   Entered and Authorized by:   [name] MD   Signed by:   [name] MD on 05/12/2010   Method used:   Electronically to        [facility] (retail)       [place] [address]       [place], [place]  [address]       Ph: [phone]       Fax: [phone]   RxID:   [id number]    Orders Added: 1)  Est. Patient Level IV [id number] 2)  T-Basic Metabolic Panel 0000000 3)  T-TSH [id number] 4)  T-Vitamin D (25-Hydroxy) [phone] 5)  T-Basic Metabolic Panel 0000000 6)  T-Lipid  Profile [[id number]] 7)  T-CBC w/Diff [id number] 8)  T-TSH [id number])  ENT Referral [ENT]

## 2010-07-30 NOTE — Letter (Signed)
Summary: Letter  Letter   Imported By: Dierdre Harness 01/14/2010 08:50:24  _____________________________________________________________________  External Attachment:    Type:   Image     Comment:   External Document

## 2010-09-24 ENCOUNTER — Encounter: Payer: Self-pay | Admitting: Family Medicine

## 2010-09-24 ENCOUNTER — Other Ambulatory Visit: Payer: Self-pay | Admitting: Family Medicine

## 2010-09-28 ENCOUNTER — Other Ambulatory Visit: Payer: Self-pay | Admitting: Family Medicine

## 2010-09-28 ENCOUNTER — Encounter: Payer: Self-pay | Admitting: Family Medicine

## 2010-09-28 LAB — LIPID PANEL
HDL: 52 mg/dL (ref >39)
LDL Cholesterol: 101 mg/dL — ABNORMAL HIGH (ref 0–99)
Total CHOL/HDL Ratio: 3.1 Ratio

## 2010-09-28 LAB — CBC WITH DIFFERENTIAL/PLATELET
Eosinophils Absolute: 0.2 10*3/uL (ref 0.0–0.7)
Eosinophils Relative: 6 % — ABNORMAL HIGH (ref 0–5)
HCT: 36.9 % (ref 36.0–46.0)
Hemoglobin: 12 g/dL (ref 12.0–15.0)
Lymphocytes Relative: 39 % (ref 12–46)
Lymphs Abs: 1.7 10*3/uL (ref 0.7–4.0)
MCH: 27 pg (ref 26.0–34.0)
MCV: 82.9 fL (ref 78.0–100.0)
Monocytes Relative: 9 % (ref 3–12)
RBC: 4.45 MIL/uL (ref 3.87–5.11)
WBC: 4.2 10*3/uL (ref 4.0–10.5)

## 2010-09-28 LAB — BASIC METABOLIC PANEL
CO2: 22 mEq/L (ref 19–32)
Calcium: 9 mg/dL (ref 8.4–10.5)
Chloride: 106 mEq/L (ref 96–112)
Creat: 1.35 mg/dL — ABNORMAL HIGH (ref 0.40–1.20)
Glucose, Bld: 94 mg/dL (ref 70–99)

## 2010-09-28 LAB — TSH: TSH: 1.714 u[IU]/mL (ref 0.350–4.500)

## 2010-09-29 ENCOUNTER — Ambulatory Visit (INDEPENDENT_AMBULATORY_CARE_PROVIDER_SITE_OTHER): Payer: Managed Care, Other (non HMO) | Admitting: Family Medicine

## 2010-09-29 ENCOUNTER — Encounter: Payer: Self-pay | Admitting: Family Medicine

## 2010-09-29 VITALS — BP 126/80 | HR 90 | Resp 16 | Ht 65.0 in | Wt 193.0 lb

## 2010-09-29 DIAGNOSIS — F329 Major depressive disorder, single episode, unspecified: Secondary | ICD-10-CM

## 2010-09-29 DIAGNOSIS — E559 Vitamin D deficiency, unspecified: Secondary | ICD-10-CM

## 2010-09-29 DIAGNOSIS — R7301 Impaired fasting glucose: Secondary | ICD-10-CM

## 2010-09-29 DIAGNOSIS — E669 Obesity, unspecified: Secondary | ICD-10-CM

## 2010-09-29 DIAGNOSIS — E039 Hypothyroidism, unspecified: Secondary | ICD-10-CM

## 2010-09-29 DIAGNOSIS — I10 Essential (primary) hypertension: Secondary | ICD-10-CM

## 2010-09-29 DIAGNOSIS — F3289 Other specified depressive episodes: Secondary | ICD-10-CM

## 2010-09-29 MED ORDER — AMLODIPINE BESY-BENAZEPRIL HCL 10-20 MG PO CAPS: 1.0000 | ORAL_CAPSULE | Freq: Every day | ORAL | Status: DC

## 2010-09-29 MED ORDER — LEVOTHYROXINE SODIUM 50 MCG PO TABS: 50.0000 ug | ORAL_TABLET | Freq: Every day | ORAL | Status: DC

## 2010-09-29 MED ORDER — VITAMIN D (ERGOCALCIFEROL) 1.25 MG (50000 UNIT) PO CAPS: 50000.0000 [IU] | ORAL_CAPSULE | ORAL | Status: DC

## 2010-09-29 MED ORDER — HYDROCHLOROTHIAZIDE 25 MG PO TABS: 25.0000 mg | ORAL_TABLET | Freq: Every day | ORAL | Status: DC

## 2010-09-29 NOTE — Patient Instructions (Signed)
F/u in 4 months.  It is important that you exercise regularly at least 30 minutes 5 times a week. If you develop chest pain, have severe difficulty breathing, or feel very tired, stop exercising immediately and seek medical attention    You need to lose weight. Please consider a lower calorie diet and regular exercise    Labs are excellent, pls continue healthy diet , your cholesterol is nearly perfect off of meds.   No med changes, continue the vit D weekly   Vitamin D and TSH and HBA1C  Non fasting, chem7 Pls try and get sufficient rest and keep your stress level down

## 2010-09-30 ENCOUNTER — Encounter: Payer: Self-pay | Admitting: Family Medicine

## 2010-09-30 NOTE — Progress Notes (Signed)
  Subjective:    Patient ID: Renee Collins, female    DOB: [birth year], 70 y.o.   MRN: [id number]  HPI  The PT is here for follow up and re-evaluation of chronic medical conditions, medication management and review of recent lab and radiology data.  Preventive health is updated, specifically  Cancer screening, Osteoporosis screening and Immunization.   Questions or concerns regarding consultations or procedures which the PT has had in the interim are  addressed. The PT denies any adverse reactions to current medications since the last visit.  There are no new concerns.  There are no specific complaints      Review of Systems  Constitutional: Negative for fever, chills, activity change, appetite change, fatigue and unexpected weight change.  HENT: Negative for hearing loss, ear pain, congestion, sore throat, rhinorrhea, sneezing, trouble swallowing, neck pain, neck stiffness, postnasal drip and sinus pressure.   Eyes: Negative for photophobia, pain, discharge, redness, itching and visual disturbance.  Respiratory: Negative for cough, chest tightness, shortness of breath and wheezing.   Cardiovascular: Negative for chest pain, palpitations and leg swelling.  Gastrointestinal: Negative for nausea, vomiting, abdominal pain, diarrhea, constipation and blood in stool.  Genitourinary: Negative for dysuria, frequency, hematuria and flank pain.  Musculoskeletal: Negative for myalgias, back pain, joint swelling, arthralgias and gait problem.  Skin: Negative for rash and wound.  Neurological: Negative for dizziness, tremors, seizures, speech difficulty, weakness, numbness and headaches.  Hematological: Negative for adenopathy. Does not bruise/bleed easily.  Psychiatric/Behavioral: Positive for suicidal ideas ( increased stress on the job, which she is coping with at this time). Negative for hallucinations, behavioral problems, confusion, sleep disturbance and decreased concentration. The patient is not  nervous/anxious and is not hyperactive.        Objective:   Physical Exam  Constitutional: She is oriented to person, place, and time. She appears well-developed and well-nourished.  HENT:  Head: Normocephalic.  Right Ear: External ear normal.  Left Ear: External ear normal.  Mouth/Throat: No oropharyngeal exudate.  Eyes: Conjunctivae and EOM are normal. Right eye exhibits no discharge. Left eye exhibits no discharge. No scleral icterus.  Neck: Normal range of motion. Neck supple. No JVD present. No tracheal deviation present. No thyromegaly present.  Cardiovascular: Normal rate, regular rhythm, normal heart sounds and intact distal pulses.   No murmur heard. Pulmonary/Chest: Effort normal and breath sounds normal. No stridor. No respiratory distress. She has no wheezes. She has no rales. She exhibits no tenderness.  Abdominal: Soft. Bowel sounds are normal. There is no tenderness. There is no rebound and no guarding.  Musculoskeletal: Normal range of motion. She exhibits no edema.  Lymphadenopathy:    She has no cervical adenopathy.  Neurological: She is alert and oriented to person, place, and time. No cranial nerve deficit. Coordination normal.  Skin: Skin is warm and dry. No rash noted. No erythema.  Psychiatric: She has a normal mood and affect. Her behavior is normal. Judgment and thought content normal.          Assessment & Plan:  Hypothyroid; adequately corrected, continue current med dose. 2, Hypertension:Controlled, no changes in medication.  3. Obesity;unchanged, commitment to regular exercise and reduction in caloric intake encoraged and discussed 4. Depression: controlled, stress relief strategies discussed to handle inc work stress.

## 2010-11-13 NOTE — Procedures (Signed)
   NAMEDerrill Collins                           ACCOUNT NO.:  [id number]   MEDICAL RECORD NO.:  [id number]                   PATIENT TYPE:  OUT   LOCATION:  RAD                                  FACILITY:  [facility]   PHYSICIAN:  Jacqulyn Ducking, M.D. [facility]           DATE OF BIRTH:  10-Mar-1941   DATE OF PROCEDURE:  05/28/2002  DATE OF DISCHARGE:                                  ECHOCARDIOGRAM   REFERRING PHYSICIANS:  Dr. Moshe Cipro and Marijo Conception. Wall, M.D.   CLINICAL DATA:  A 70 year old woman with dyspnea and hypertension.   FINDINGS/IMPRESSION:  1. A technically adequate echocardiographic study.  2. Mild left atrial enlargement; normal right atrium and right ventricle.  3. Normal mitral valve with mild-to-moderate annular calcification.  4. Normal tricuspid valve.  5. Minimal aortic valvular sclerosis.  6. Normal left ventricular size with borderline LVH, no areas of akinesis or     dyskinesis noted.  Overall LV systolic function is normal to     hyperdynamic.  Left ventricular function was evaluated with the     assistance of injection of intravenous echocardiographic contrast.                                               Jacqulyn Ducking, M.D. [facility]    RR/MEDQ  D:  05/29/2002  T:  05/29/2002  Job:  [id number]

## 2010-11-13 NOTE — H&P (Signed)
   NAMEDerrill Collins                           ACCOUNT NO.:  [id number]   MEDICAL RECORD NO.:  [id number]                   PATIENT TYPE:  AMB   LOCATION:  DAY                                  FACILITY:  [facility]   PHYSICIAN:  Leane Para C. Tamala Julian, M.D.                DATE OF BIRTH:  Dec 17, 1940   DATE OF ADMISSION:  02/21/2002  DATE OF DISCHARGE:                                HISTORY & PHYSICAL   HISTORY OF PRESENT ILLNESS:  A 70 year old female with a history of anemia,  proctitis, no rectal bleeding, no abdominal pain.  The patient has had iron-  deficiency anemia since early July.  She was referred for upper and lower  endoscopy for evaluation of her iron-deficiency anemia.   PAST MEDICAL HISTORY:  She has hypertension and anemia.   MEDICATIONS:  Hydrochlorothiazide, Accupril, __________.   PAST SURGICAL HISTORY:  Total abdominal hysterectomy, bilateral salpingo-  oophorectomy, and appendectomy.   PHYSICAL EXAMINATION:  VITAL SIGNS:  Blood pressure 158/90, pulse 80,  respirations 18.  Weight 198 pounds.  HEENT:  Unremarkable.  NECK:  Supple without JVD or bruit.  CHEST:  Clear to auscultation.  CARDIAC:  Regular rate and rhythm without murmur, gallop, or rub.  ABDOMEN:  Soft, nontender, no masses.  EXTREMITIES:  No cyanosis, clubbing, or edema.  RECTAL:  Normal, stool guaiac-negative.  NEUROLOGIC:  No focal motor, sensory, or cerebellar deficit.   IMPRESSION:  1. Chronic anemia.  2. History of proctitis.  3. Metabolic syndrome.  4. Hypertension.   PLAN:  Upper and lower endoscopy.                                               Vernon Prey. Tamala Julian, M.D.    LCS/MEDQ  D:  02/21/2002  T:  02/21/2002  Job:  [id number]

## 2010-11-13 NOTE — H&P (Signed)
NAMEWallie Collins                 ACCOUNT NO.:  [id number]   MEDICAL RECORD NO.:  [id number]            PATIENT TYPE:   LOCATION:                                 FACILITY:   PHYSICIAN:  R. Garfield Cornea, M.D. DATE OF BIRTH:  04/20/1941   DATE OF ADMISSION:  DATE OF DISCHARGE:  LH                                HISTORY & PHYSICAL   REASON FOR CONSULTATION:  Reflux.   HISTORY OF PRESENT ILLNESS:  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 [name] to further evaluate a 5 year history of  reflux symptoms. Ms. Jerl Santos describes typical heartburn and regurgitation  almost daily which has seriously worsened over the past 5 years. She  recently started on omeprazole 20 mg OTC with significant improvement in her  symptoms. She has not really had any alarm features such as odynophagia or  dysphagia, no melena or rectal bleeding, change in bowel habits, no early  satiety. She has lost from 205 to 187 pounds over the past 5 years trying to  obtain a more healthful weight. She has never had her upper GI tract  evaluated. There is no history of tobacco or alcohol use. Dr. Tamala Julian  performed a colonoscopy on her 2 years ago for screening purposes. She was  said to have had petechia. It was recommended she come back in 10 years  for another colonoscopy.   Ms. Jerl Santos tells me she works shift work and has a habit of eating a good meal  often times within an hour of lying down and she also sometimes drinks a  carbonated beverage or coffee just before lying down and in fact keeps a  carbonated beverage at her bedside while she sleeps.   PAST MEDICAL HISTORY:  Significant for osteoarthritis, hypercholesterolemia,  hypertension, GERD, anxiety, neurosis, depression.   PAST SURGICAL HISTORY:  Hysterectomy, laminectomy, status post colonoscopy  as outlined above.   CURRENT MEDICATIONS:  1.  Omeprazole 20 mg  daily b.i.d.  2.  ASA 81 mg daily.  3.  Calcium 1500 mg daily.  4.  Naproxen 220 mg p.r.n.  5.  HCTZ 25 mg daily.  6.  Bupropion SR 150 mg b.i.d.  7.  CholestOff b.i.d.  8.  Lotrel 10/20 daily.  9.  Move Free once daily.  10. Multivitamin daily.   ALLERGIES:  No known drug allergies.   FAMILY HISTORY:  Father died with some kidney related complication problems.  Mother died age 72 with a MI. No history of chronic GI or liver illness.   SOCIAL HISTORY:  The patient is divorced, she has had 2 children. 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 No tobacco, no alcohol.   REVIEW OF SYSTEMS:  No chest pain or dyspnea on exertion. No fever or  chills. Weight loss as outlined above.   PHYSICAL EXAMINATION:  GENERAL:  Reveals a pleasant, 70 year old lady  resting comfortably.  VITAL SIGNS:  Weight 185, height 5 foot 6, temperature 97.7, BP  122/70,  pulse 74.  SKIN:  Warm and dry.  HEENT:  No scleral icterus. Conjunctiva pink. Oral cavity no lesions. JVD is  not prominent.  CHEST:  Lungs are clear to auscultation.  CARDIAC:  Regular rate and rhythm without murmur, gallop or rub.  ABDOMEN:  Nondistended, positive bowel sounds, soft, nontender without  appreciable mass or organomegaly.  EXTREMITIES:  No edema.   IMPRESSION:  [name] is a pleasant 70 year old lady with a 5 year  history of consistently worsening gastroesophageal reflux disease symptoms,  really does not have any alarm features. She has had a good response to  recent course of omeprazole. She does have some suboptimal eating habits  including taking in a large amount and then lying down and drinking  carbonated beverages around the time of being in the supine position.   We talked about the __________ approach to gastroesophageal reflux disease  and the importance of going to bed with an empty stomach. I provided her  literature on antireflux lifestyle/diet. Since she has had symptoms ongoing  now for a good 5  years and given her age, I feel it would be a good idea to  go ahead and survey her upper GI tract. To this end, I have offered Ms. Obie  an esophagogastroduodenoscopy.  The potential risks, benefits, and  alternatives have been reviewed, questions answered. She is agreeable. I  will plan to perform an EGD in the very near future.   Further recommendations to follow.   I would like to thank Dr. [name] for allowing me to see this  delightful lady today.      Bridgette Habermann, M.D.  Electronically Signed     RMR/MEDQ  D:  01/18/2006  T:  01/18/2006  Job:  [id number]   cc:   Norwood Levo. Moshe Cipro, M.D.  Fax: [phone]

## 2010-11-13 NOTE — Op Note (Signed)
NAMESullivan Collins                 ACCOUNT NO.:  [id number]   MEDICAL RECORD NO.:  [id number]          PATIENT TYPE:  AMB   LOCATION:  DAY                           FACILITY:  [facility]   PHYSICIAN:  R. Garfield Cornea, M.D. DATE OF BIRTH:  Sep 14, 1940   DATE OF PROCEDURE:  02/04/2006  DATE OF DISCHARGE:                                 OPERATIVE REPORT   PROCEDURE:  Diagnostic esophagogastroduodenoscopy.   ENDOSCOPIST:  Bridgette Habermann, M.D.   INDICATIONS FOR PROCEDURE:  The patient is a 70 year old lady with  refractory reflux symptoms, some amelioration with Omeprazole but she  continues to have almost daily symptoms.  No odynophagia, dysphagia or GI  bleeding.  The EGD is now being done.  I discussed with the patient at  length the potential risks, benefits and alternatives.  These have been  reviewed and any questions answered.  She is agreeable.  Placement of  documentation in the medical record.   DESCRIPTION OF PROCEDURE:  O2 saturation, blood pressure, pulse and  respirations were monitored throughout the entire procedure.  Conscious  sedation with Versed 2 mg IV and Demerol 50 mg IV with topical pharyngeal  anesthesia.   FINDINGS:  The findings of the tubular esophageal revealed no mucosal  abnormalities.  The esophagus was somewhat cork-screwed.  The EG junction  was patulous and easily traversed.   STOMACH:  The gastric cavity was insufflated with air and a thorough  examination of the gastric mucosa including retroflexion in the proximal  stomach and esophagogastric junction was undertaken.  The mucosa appeared  normal.  The patient had a moderate-sized hiatal hernia but also had a  segment of the stomach going up the side of the EG junction, concerning for  a paraesophageal component.  The pylorus was patent and easily traversed.  Examination of the bulb and second portion revealed no abnormalities.   THERAPEUTIC/DIAGNOSTIC MANEUVERS PERFORMED:  None.   The patient  tolerated the procedure well and was reactive in endoscopy.   IMPRESSION:  Somewhat cork-screwed esophagus, otherwise normal mucosa.  Patulous EG junction.  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  Patent pylorus.  Normal D-I and D-II.   RECOMMENDATIONS:  1. Stop Omeprazole.  2. Begin Aciphex 20 mg orally daily.  3. Proceed with an upper GI series to confirm endoscopic findings.  4. Gastroesophageal reflux disease literature provided to [name].  5. Further recommendations to follow.      Bridgette Habermann, M.D.  Electronically Signed     RMR/MEDQ  D:  02/04/2006  T:  02/04/2006  Job:  [id number]   cc:   Norwood Levo. Moshe Cipro, M.D.  Fax: [phone]

## 2011-01-16 LAB — BASIC METABOLIC PANEL
CO2: 23 mEq/L (ref 19–32)
Calcium: 9.8 mg/dL (ref 8.4–10.5)
Chloride: 104 mEq/L (ref 96–112)
Creat: 1.28 mg/dL — ABNORMAL HIGH (ref 0.50–1.10)
Sodium: 140 mEq/L (ref 135–145)

## 2011-01-16 LAB — HEMOGLOBIN A1C: Mean Plasma Glucose: 146 mg/dL — ABNORMAL HIGH (ref <117)

## 2011-01-16 LAB — TSH: TSH: 3.172 u[IU]/mL (ref 0.350–4.500)

## 2011-01-16 LAB — VITAMIN D 25 HYDROXY (VIT D DEFICIENCY, FRACTURES): Vit D, 25-Hydroxy: 49 ng/mL (ref 30–89)

## 2011-01-18 ENCOUNTER — Telehealth: Payer: Self-pay | Admitting: Family Medicine

## 2011-01-18 NOTE — Telephone Encounter (Signed)
Spoke with patient regarding lab results. 

## 2011-01-27 ENCOUNTER — Encounter: Payer: Self-pay | Admitting: Family Medicine

## 2011-01-28 ENCOUNTER — Encounter: Payer: Self-pay | Admitting: Family Medicine

## 2011-01-28 ENCOUNTER — Ambulatory Visit (INDEPENDENT_AMBULATORY_CARE_PROVIDER_SITE_OTHER): Payer: Medicare Other | Admitting: Family Medicine

## 2011-01-28 VITALS — BP 120/72 | HR 82 | Resp 16 | Ht 66.0 in | Wt 194.8 lb

## 2011-01-28 DIAGNOSIS — I10 Essential (primary) hypertension: Secondary | ICD-10-CM

## 2011-01-28 DIAGNOSIS — J302 Other seasonal allergic rhinitis: Secondary | ICD-10-CM

## 2011-01-28 DIAGNOSIS — F3289 Other specified depressive episodes: Secondary | ICD-10-CM

## 2011-01-28 DIAGNOSIS — E039 Hypothyroidism, unspecified: Secondary | ICD-10-CM

## 2011-01-28 DIAGNOSIS — E1169 Type 2 diabetes mellitus with other specified complication: Secondary | ICD-10-CM | POA: Insufficient documentation

## 2011-01-28 DIAGNOSIS — E785 Hyperlipidemia, unspecified: Secondary | ICD-10-CM

## 2011-01-28 DIAGNOSIS — E119 Type 2 diabetes mellitus without complications: Secondary | ICD-10-CM

## 2011-01-28 DIAGNOSIS — J309 Allergic rhinitis, unspecified: Secondary | ICD-10-CM

## 2011-01-28 DIAGNOSIS — E559 Vitamin D deficiency, unspecified: Secondary | ICD-10-CM

## 2011-01-28 DIAGNOSIS — R5381 Other malaise: Secondary | ICD-10-CM

## 2011-01-28 DIAGNOSIS — F329 Major depressive disorder, single episode, unspecified: Secondary | ICD-10-CM

## 2011-01-28 DIAGNOSIS — R5383 Other fatigue: Secondary | ICD-10-CM

## 2011-01-28 MED ORDER — OMEPRAZOLE 20 MG PO CPDR: DELAYED_RELEASE_CAPSULE | ORAL | Status: DC

## 2011-01-28 MED ORDER — ACCU-CHEK MULTICLIX LANCETS MISC: Status: DC

## 2011-01-28 MED ORDER — GLUCOSE BLOOD VI STRP: ORAL_STRIP | Status: DC

## 2011-01-28 MED ORDER — AMLODIPINE BESY-BENAZEPRIL HCL 10-20 MG PO CAPS: 1.0000 | ORAL_CAPSULE | Freq: Every day | ORAL | Status: DC

## 2011-01-28 MED ORDER — METFORMIN HCL 500 MG PO TABS: 500.0000 mg | ORAL_TABLET | Freq: Every day | ORAL | Status: DC

## 2011-01-28 NOTE — Patient Instructions (Signed)
Welcome to medicare f/u in 12 weeks.  You are now diabetic, pls plan to attend a class at the hospital next week.  You need to test once daily and take metformin one daily.  It is important that you exercise regularly at least 30 minutes 5 times a week. If you develop chest pain, have severe difficulty breathing, or feel very tired, stop exercising immediately and seek medical attention    A healthy diet is rich in fruit, vegetables and whole grains. Poultry fish, nuts and beans are a healthy choice for protein rather then red meat. A low sodium diet and drinking 64 ounces of water daily is generally recommended. Oils and sweet should be limited. Carbohydrates especially for those who are diabetic or overweight, should be limited to 30-45 gram per meal. It is important to eat on a regular schedule, at least 3 times daily. Snacks should be primarily fruits, vegetables or nuts.   Fasting labs in 3 months LABWORK  NEEDS TO BE DONE BETWEEN 3 TO 7 DAYS BEFORE YOUR NEXT SCEDULED  VISIT.  THIS WILL IMPROVE THE QUALITY OF YOUR CARE. Pls stop sweet tea and reduce fatty foods

## 2011-01-29 LAB — MICROALBUMIN / CREATININE URINE RATIO
Creatinine, Urine: 222.4 mg/dL
Microalb, Ur: 3 mg/dL — ABNORMAL HIGH (ref 0.00–1.89)

## 2011-02-03 ENCOUNTER — Other Ambulatory Visit: Payer: Self-pay | Admitting: Family Medicine

## 2011-02-11 ENCOUNTER — Other Ambulatory Visit: Payer: Self-pay | Admitting: Family Medicine

## 2011-02-16 ENCOUNTER — Encounter: Payer: Self-pay | Admitting: Family Medicine

## 2011-02-16 DIAGNOSIS — J302 Other seasonal allergic rhinitis: Secondary | ICD-10-CM | POA: Insufficient documentation

## 2011-02-16 NOTE — Assessment & Plan Note (Signed)
Controlled, no change in medication  

## 2011-02-16 NOTE — Assessment & Plan Note (Signed)
Pt to use flonase as needed

## 2011-02-16 NOTE — Assessment & Plan Note (Signed)
Pt to start metformin and attend diabetic ed class

## 2011-02-16 NOTE — Progress Notes (Signed)
  Subjective:    Patient ID: Renee Collins, female    DOB: [birth year], 70 y.o.   MRN: [id number]  HPI The PT is here for follow up and re-evaluation of chronic medical conditions, medication management and review of any available recent lab and radiology data.  Preventive health is updated, specifically  Cancer screening and Immunization.   Questions or concerns regarding consultations or procedures which the PT has had in the interim are  addressed. The PT denies any adverse reactions to current medications since the last visit.  There are no new concerns.  There are no specific complaints       Review of Systems    Denies recent fever or chills. Denies sinus pressure, nasal congestion, ear pain or sore throat. Denies chest congestion, productive cough or wheezing. Denies chest pains, palpitations and leg swelling Denies abdominal pain, nausea, vomiting,diarrhea or constipation.   Denies dysuria, frequency, hesitancy or incontinence. Denies joint pain, swelling and limitation in mobility. Denies headaches, seizures, numbness, or tingling. Denies depression, anxiety or insomnia. Denies skin break down or rash.     Objective:   Physical Exam Patient alert and oriented and in no cardiopulmonary distress.  HEENT: No facial asymmetry, EOMI, no sinus tenderness,  oropharynx pink and moist.  Neck supple no adenopathy.  Chest: Clear to auscultation bilaterally.  CVS: S1, S2 no murmurs, no S3.  ABD: Soft non tender. Bowel sounds normal.  Ext: No edema  MS: Adequate ROM spine, shoulders, hips and knees.  Skin: Intact, no ulcerations or rash noted.  Psych: Good eye contact, normal affect. Memory intact not anxious or depressed appearing.  CNS: CN 2-12 intact, power, tone and sensation normal throughout.        Assessment & Plan:

## 2011-03-09 ENCOUNTER — Other Ambulatory Visit: Payer: Self-pay | Admitting: Family Medicine

## 2011-04-13 ENCOUNTER — Other Ambulatory Visit: Payer: Self-pay | Admitting: Family Medicine

## 2011-04-13 DIAGNOSIS — Z139 Encounter for screening, unspecified: Secondary | ICD-10-CM

## 2011-04-20 ENCOUNTER — Ambulatory Visit (HOSPITAL_COMMUNITY)
Admission: RE | Admit: 2011-04-20 | Discharge: 2011-04-20 | Disposition: A | Discharge status: Home / Self Care | Payer: Medicare Other | Source: Ambulatory Visit | Attending: Family Medicine | Admitting: Family Medicine

## 2011-04-20 ENCOUNTER — Ambulatory Visit (HOSPITAL_COMMUNITY): Payer: Medicare Other

## 2011-04-20 DIAGNOSIS — Z139 Encounter for screening, unspecified: Secondary | ICD-10-CM

## 2011-04-20 DIAGNOSIS — Z1231 Encounter for screening mammogram for malignant neoplasm of breast: Secondary | ICD-10-CM | POA: Insufficient documentation

## 2011-04-21 LAB — COMPLETE METABOLIC PANEL WITH GFR
ALT: 20 U/L (ref 0–35)
AST: 22 U/L (ref 0–37)
Albumin: 4.1 g/dL (ref 3.5–5.2)
Alkaline Phosphatase: 70 U/L (ref 39–117)
BUN: 23 mg/dL (ref 6–23)
CO2: 22 mEq/L (ref 19–32)
Calcium: 9.4 mg/dL (ref 8.4–10.5)
Chloride: 107 mEq/L (ref 96–112)
Creat: 1.21 mg/dL — ABNORMAL HIGH (ref 0.50–1.10)
GFR, Est African American: 52 mL/min — ABNORMAL LOW (ref >90)
GFR, Est Non African American: 45 mL/min — ABNORMAL LOW (ref >90)
Glucose, Bld: 95 mg/dL (ref 70–99)
Potassium: 4.2 mEq/L (ref 3.5–5.3)
Sodium: 139 mEq/L (ref 135–145)
Total Bilirubin: 0.3 mg/dL (ref 0.3–1.2)
Total Protein: 8.7 g/dL — ABNORMAL HIGH (ref 6.0–8.3)

## 2011-04-21 LAB — LIPID PANEL
LDL Cholesterol: 117 mg/dL — ABNORMAL HIGH (ref 0–99)
Total CHOL/HDL Ratio: 3.2 Ratio
VLDL: 13 mg/dL (ref 0–40)

## 2011-04-23 ENCOUNTER — Encounter: Payer: Self-pay | Admitting: Family Medicine

## 2011-04-28 ENCOUNTER — Encounter: Payer: Self-pay | Admitting: Family Medicine

## 2011-04-28 ENCOUNTER — Ambulatory Visit (INDEPENDENT_AMBULATORY_CARE_PROVIDER_SITE_OTHER): Payer: Medicare Other | Admitting: Family Medicine

## 2011-04-28 VITALS — BP 130/70 | HR 84 | Resp 16 | Ht 65.0 in | Wt 191.8 lb

## 2011-04-28 DIAGNOSIS — E119 Type 2 diabetes mellitus without complications: Secondary | ICD-10-CM

## 2011-04-28 DIAGNOSIS — Z13820 Encounter for screening for osteoporosis: Secondary | ICD-10-CM

## 2011-04-28 DIAGNOSIS — F3289 Other specified depressive episodes: Secondary | ICD-10-CM

## 2011-04-28 DIAGNOSIS — R5383 Other fatigue: Secondary | ICD-10-CM

## 2011-04-28 DIAGNOSIS — E785 Hyperlipidemia, unspecified: Secondary | ICD-10-CM

## 2011-04-28 DIAGNOSIS — Z23 Encounter for immunization: Secondary | ICD-10-CM

## 2011-04-28 DIAGNOSIS — R5381 Other malaise: Secondary | ICD-10-CM

## 2011-04-28 DIAGNOSIS — I10 Essential (primary) hypertension: Secondary | ICD-10-CM

## 2011-04-28 DIAGNOSIS — F329 Major depressive disorder, single episode, unspecified: Secondary | ICD-10-CM

## 2011-04-28 DIAGNOSIS — E039 Hypothyroidism, unspecified: Secondary | ICD-10-CM

## 2011-04-28 MED ORDER — PRAVASTATIN SODIUM 20 MG PO TABS: 20.0000 mg | ORAL_TABLET | Freq: Every evening | ORAL | Status: DC

## 2011-04-28 NOTE — Patient Instructions (Addendum)
F/u in 4 months.  TdaP , pneumonia and flu vaccines today.  Call your insurance to see if shingles vaccine is covered we will give it to you   You will be referred for an eye exam and a bone density test  Colonoscopy due 01/2012     HBA1c, fasting cmp, lipids, egfr, hba1c and TSH in 4.5 month   It is important that you exercise regularly at least 30 minutes 5 times a week. If you develop chest pain, have severe difficulty breathing, or feel very tired, stop exercising immediately and seek medical attention  A healthy diet is rich in fruit, vegetables and whole grains. Poultry fish, nuts and beans are a healthy choice for protein rather then red meat. A low sodium diet and drinking 64 ounces of water daily is generally recommended. Oils and sweet should be limited. Carbohydrates especially for those who are diabetic or overweight, should be limited to 30-45 gram per meal. It is important to eat on a regular schedule, at least 3 times daily. Snacks should be primarily fruits, vegetables or nuts.

## 2011-05-02 NOTE — Assessment & Plan Note (Signed)
Controlled, no change in medication  

## 2011-05-02 NOTE — Assessment & Plan Note (Signed)
Deteriorated, low fat diet discussed and encouraged

## 2011-05-02 NOTE — Progress Notes (Signed)
  Subjective:    Patient ID: Renee Collins, female    DOB: 14-Jul-1940, 70 y.o.   MRN: [id number]  HPI Pt in for her welcome to medicare visit. She has completed a history, which shows no recent hospitalizations or Ed visit. Routine cancer screening is up to date, and her bone density and eye exams are due. She needs immunization updated and these , with the exception of the shingles vaccine are adminisetred. She has no specifc health concerns The completed questionaire will be scanned into her permanent  Record. Pt does execise on a fairly regular basis but plans to improve, des on avg 3 days per week    Review of Systems Denies recent fever or chills. Denies sinus pressure, nasal congestion, ear pain or sore throat. Denies chest congestion, productive cough or wheezing. Denies chest pains, palpitations and leg swelling Denies abdominal pain, nausea, vomiting,diarrhea or constipation.   Denies dysuria, frequency, hesitancy or incontinence. Denies joint pain, swelling and limitation in mobility. Denies headaches, seizures, numbness, or tingling. Denies depression, anxiety or insomnia. Denies skin break down or rash.        Objective:   Physical Exam Patient alert and oriented and in no cardiopulmonary distress.  HEENT: No facial asymmetry, EOMI, no sinus tenderness,  oropharynx pink and moist.  Neck supple no adenopathy.  Chest: Clear to auscultation bilaterally.  CVS: S1, S2 no murmurs, no S3.  ABD: Soft non tender. Bowel sounds normal.  Ext: No edema  MS: Adequate ROM spine, shoulders, hips and knees.  Skin: Intact, no ulcerations or rash noted.  Psych: Good eye contact, normal affect. Memory intact not anxious or depressed appearing.  CNS: CN 2-12 intact, power, tone and sensation normal throughout. Questionaire re safety in her home environment, ability to carry out ADL's and to be responsible for her finances  Is done and demonstarte competence in all areas. Her  depression screen is negative     Assessment & Plan:

## 2011-05-02 NOTE — Assessment & Plan Note (Signed)
Controlled and stable on medication , no change

## 2011-05-05 ENCOUNTER — Encounter: Payer: Self-pay | Admitting: Family Medicine

## 2011-05-12 ENCOUNTER — Other Ambulatory Visit: Payer: Self-pay | Admitting: Family Medicine

## 2011-06-04 ENCOUNTER — Other Ambulatory Visit: Payer: Self-pay | Admitting: Family Medicine

## 2011-06-30 ENCOUNTER — Other Ambulatory Visit: Payer: Self-pay | Admitting: Family Medicine

## 2011-08-11 ENCOUNTER — Other Ambulatory Visit: Payer: Self-pay | Admitting: Family Medicine

## 2011-08-23 ENCOUNTER — Other Ambulatory Visit: Payer: Self-pay | Admitting: Family Medicine

## 2011-08-24 LAB — COMPLETE METABOLIC PANEL WITH GFR
ALT: 17 U/L (ref 0–35)
Alkaline Phosphatase: 64 U/L (ref 39–117)
CO2: 23 mEq/L (ref 19–32)
Creat: 1.2 mg/dL — ABNORMAL HIGH (ref 0.50–1.10)
GFR, Est African American: 53 mL/min — ABNORMAL LOW (ref >90)
Sodium: 137 mEq/L (ref 135–145)
Total Bilirubin: 0.4 mg/dL (ref 0.3–1.2)

## 2011-08-24 LAB — LIPID PANEL
HDL: 54 mg/dL (ref >39)
LDL Cholesterol: 82 mg/dL (ref 0–99)
Total CHOL/HDL Ratio: 2.7 Ratio
Triglycerides: 60 mg/dL (ref <150)

## 2011-08-24 LAB — HEMOGLOBIN A1C
Hgb A1c MFr Bld: 6.5 % — ABNORMAL HIGH (ref <5.7)
Mean Plasma Glucose: 140 mg/dL — ABNORMAL HIGH (ref <117)

## 2011-08-26 ENCOUNTER — Other Ambulatory Visit: Payer: Self-pay

## 2011-08-26 ENCOUNTER — Ambulatory Visit (INDEPENDENT_AMBULATORY_CARE_PROVIDER_SITE_OTHER): Payer: Medicare Other | Admitting: Family Medicine

## 2011-08-26 ENCOUNTER — Encounter: Payer: Self-pay | Admitting: Family Medicine

## 2011-08-26 VITALS — BP 130/78 | HR 100 | Resp 15 | Ht 65.0 in | Wt 195.0 lb

## 2011-08-26 DIAGNOSIS — Z23 Encounter for immunization: Secondary | ICD-10-CM

## 2011-08-26 DIAGNOSIS — F329 Major depressive disorder, single episode, unspecified: Secondary | ICD-10-CM

## 2011-08-26 DIAGNOSIS — R5383 Other fatigue: Secondary | ICD-10-CM

## 2011-08-26 DIAGNOSIS — E119 Type 2 diabetes mellitus without complications: Secondary | ICD-10-CM

## 2011-08-26 DIAGNOSIS — I10 Essential (primary) hypertension: Secondary | ICD-10-CM

## 2011-08-26 DIAGNOSIS — Z2911 Encounter for prophylactic immunotherapy for respiratory syncytial virus (RSV): Secondary | ICD-10-CM

## 2011-08-26 DIAGNOSIS — Z13820 Encounter for screening for osteoporosis: Secondary | ICD-10-CM

## 2011-08-26 DIAGNOSIS — E039 Hypothyroidism, unspecified: Secondary | ICD-10-CM

## 2011-08-26 DIAGNOSIS — R5381 Other malaise: Secondary | ICD-10-CM

## 2011-08-26 DIAGNOSIS — E785 Hyperlipidemia, unspecified: Secondary | ICD-10-CM

## 2011-08-26 DIAGNOSIS — F3289 Other specified depressive episodes: Secondary | ICD-10-CM

## 2011-08-26 MED ORDER — BUPROPION HCL ER (SR) 150 MG PO TB12: 150.0000 mg | ORAL_TABLET | Freq: Every day | ORAL | Status: DC

## 2011-08-26 MED ORDER — AMLODIPINE BESYLATE 10 MG PO TABS: 10.0000 mg | ORAL_TABLET | Freq: Every day | ORAL | Status: DC

## 2011-08-26 MED ORDER — LEVOTHYROXINE SODIUM 50 MCG PO TABS: ORAL_TABLET | ORAL | Status: DC

## 2011-08-26 MED ORDER — BENAZEPRIL HCL 20 MG PO TABS: 20.0000 mg | ORAL_TABLET | Freq: Every day | ORAL | Status: DC

## 2011-08-26 MED ORDER — OMEPRAZOLE 20 MG PO CPDR: DELAYED_RELEASE_CAPSULE | ORAL | Status: DC

## 2011-08-26 NOTE — Progress Notes (Signed)
  Subjective:    Patient ID: Renee Collins, female    DOB: 29-Oct-1940, 71 y.o.   MRN: [id number]  HPI The PT is here for follow up and re-evaluation of chronic medical conditions, medication management and review of any available recent lab and radiology data.  Preventive health is updated, specifically  Cancer screening and Immunization.   Questions or concerns regarding consultations or procedures which the PT has had in the interim are  addressed. The PT denies any adverse reactions to current medications since the last visit.  There are no new concerns.  There are no specific complaints  Tests fasting sugars every day, generally gets 105, has been up to 118. Denies polyuria polydipsia or blurred vision. Last eye exam 04/2012 , no diabetic retinopathy Exercises up to 1 hr 5 days per week, has overindulged in sweets and has gained weight Sleeps on average 10 hrs per day     Review of Systems     Objective:   Physical Exam  Patient alert and oriented and in no cardiopulmonary distress.  HEENT: No facial asymmetry, EOMI, no sinus tenderness,  oropharynx pink and moist.  Neck supple no adenopathy.  Chest: Clear to auscultation bilaterally.  CVS: S1, S2 no murmurs, no S3.  ABD: Soft non tender. Bowel sounds normal.  Ext: No edema  MS: Adequate ROM spine, shoulders, hips and knees.  Skin: Intact, no ulcerations or rash noted.  Psych: Good eye contact, normal affect. Memory intact not anxious or depressed appearing.  CNS: CN 2-12 intact, power, tone and sensation normal throughout. Diabetic Foot Check:  Appearance -  calluses Skin - no unusual pallor or redness Sensation - grossly intact to light touch Monofilament testing -  Right - Great toe, medial, central, lateral ball and posterior foot intact Left - Great toe, medial, central, lateral ball and posterior foot intact Pulses Left - Dorsalis Pedis and Posterior Tibia normal Right - Dorsalis Pedis and Posterior Tibia  normal       Assessment & Plan:

## 2011-08-26 NOTE — Patient Instructions (Signed)
F/u in 5 month  It is important that you exercise regularly at least 30 minutes 5 times a week. If you develop chest pain, have severe difficulty breathing, or feel very tired, stop exercising immediately and seek medical attention    A healthy diet is rich in fruit, vegetables and whole grains. Poultry fish, nuts and beans are a healthy choice for protein rather then red meat. A low sodium diet and drinking 64 ounces of water daily is generally recommended. Oils and sweet should be limited. Carbohydrates especially for those who are diabetic or overweight, should be limited to 34-45 gram per meal. It is important to eat on a regular schedule, at least 3 times daily. Snacks should be primarily fruits, vegetables or nuts.   Weight loss goal of 7 pounds  HBA1C, CBC , chem 7 and TSH in 5 month  Stop lotrel when you complete this script, new medication is the 2 generic drugs, amlodipine and benazepril which are in lotrel  Zostavax today  You will be referred for a bone density scan

## 2011-08-27 ENCOUNTER — Other Ambulatory Visit: Payer: Self-pay | Admitting: Family Medicine

## 2011-08-27 ENCOUNTER — Other Ambulatory Visit: Payer: Self-pay

## 2011-08-27 MED ORDER — OMEPRAZOLE 20 MG PO CPDR: DELAYED_RELEASE_CAPSULE | ORAL | Status: DC

## 2011-08-27 NOTE — Assessment & Plan Note (Signed)
Hyperlipidemia:Low fat diet discussed and encouraged.  Will update labs at next visit

## 2011-08-27 NOTE — Assessment & Plan Note (Signed)
Controlled, no change in medication  

## 2011-08-27 NOTE — Assessment & Plan Note (Signed)
Controlled, no change in medication Will break up lotrel to generic constituents for cost saving

## 2011-09-01 ENCOUNTER — Ambulatory Visit (HOSPITAL_COMMUNITY)
Admission: RE | Admit: 2011-09-01 | Discharge: 2011-09-01 | Disposition: A | Discharge status: Home / Self Care | Payer: Medicare Other | Source: Ambulatory Visit | Attending: Family Medicine | Admitting: Family Medicine

## 2011-09-01 DIAGNOSIS — M810 Age-related osteoporosis without current pathological fracture: Secondary | ICD-10-CM | POA: Insufficient documentation

## 2011-09-01 DIAGNOSIS — Z13820 Encounter for screening for osteoporosis: Secondary | ICD-10-CM

## 2011-11-09 ENCOUNTER — Other Ambulatory Visit: Payer: Self-pay | Admitting: Family Medicine

## 2012-01-14 LAB — CBC WITH DIFFERENTIAL/PLATELET
Eosinophils Relative: 5 % (ref 0–5)
HCT: 35.3 % — ABNORMAL LOW (ref 36.0–46.0)
Lymphocytes Relative: 39 % (ref 12–46)
Lymphs Abs: 1.6 10*3/uL (ref 0.7–4.0)
MCH: 27.9 pg (ref 26.0–34.0)
MCV: 82.9 fL (ref 78.0–100.0)
Monocytes Absolute: 0.4 10*3/uL (ref 0.1–1.0)
RBC: 4.26 MIL/uL (ref 3.87–5.11)
RDW: 13.7 % (ref 11.5–15.5)
WBC: 4 10*3/uL (ref 4.0–10.5)

## 2012-01-14 LAB — BASIC METABOLIC PANEL
BUN: 21 mg/dL (ref 6–23)
CO2: 24 mEq/L (ref 19–32)
Chloride: 106 mEq/L (ref 96–112)
Creat: 1.29 mg/dL — ABNORMAL HIGH (ref 0.50–1.10)

## 2012-01-17 ENCOUNTER — Ambulatory Visit (INDEPENDENT_AMBULATORY_CARE_PROVIDER_SITE_OTHER): Payer: Medicare Other | Admitting: Family Medicine

## 2012-01-17 ENCOUNTER — Encounter: Payer: Self-pay | Admitting: Family Medicine

## 2012-01-17 VITALS — BP 128/72 | HR 88 | Resp 18 | Ht 65.0 in | Wt 186.0 lb

## 2012-01-17 DIAGNOSIS — I10 Essential (primary) hypertension: Secondary | ICD-10-CM

## 2012-01-17 DIAGNOSIS — E039 Hypothyroidism, unspecified: Secondary | ICD-10-CM

## 2012-01-17 DIAGNOSIS — E785 Hyperlipidemia, unspecified: Secondary | ICD-10-CM

## 2012-01-17 DIAGNOSIS — Z1211 Encounter for screening for malignant neoplasm of colon: Secondary | ICD-10-CM

## 2012-01-17 DIAGNOSIS — Z0000 Encounter for general adult medical examination without abnormal findings: Secondary | ICD-10-CM

## 2012-01-17 DIAGNOSIS — E119 Type 2 diabetes mellitus without complications: Secondary | ICD-10-CM

## 2012-01-17 NOTE — Assessment & Plan Note (Signed)
Controlled on current medication pt to continue same

## 2012-01-17 NOTE — Progress Notes (Signed)
Subjective:    Patient ID: Renee Collins, female    DOB: 08-20-1940, 71 y.o.   MRN: [id number]  HPI Preventive Screening-Counseling & Management   Patient present here today for a Medicare annual wellness visit.   Current Problems (verified)   Medications Prior to Visit Allergies (verified)   PAST HISTORY  Family History  Social History    Risk Factors  Current exercise habits:  6 days per week, 30 minutes each time Dietary issues discussed:low carb , low fat diet   Cardiac risk factors:   Depression Screen  (Note: if answer to either of the following is "Yes", a more complete depression screening is indicated)   Over the past two weeks, have you felt down, depressed or hopeless? No  Over the past two weeks, have you felt little interest or pleasure in doing things? No  Have you lost interest or pleasure in daily life? No  Do you often feel hopeless? No  Do you cry easily over simple problems? No   Activities of Daily Living  In your present state of health, do you have any difficulty performing the following activities?  Driving?: No Managing money?: No Feeding yourself?:No Getting from bed to chair?:No Climbing a flight of stairs?:No Preparing food and eating?:No Bathing or showering?:No Getting dressed?:No Getting to the toilet?:No Using the toilet?:No Moving around from place to place?: No  Fall Risk Assessment In the past year have you fallen or had a near fall?:No Are you currently taking any medications that make you dizziness?:No   Hearing Difficulties: No Do you often ask people to speak up or repeat themselves?:No Do you experience ringing or noises in your ears?:No Do you have difficulty understanding soft or whispered voices?:No  Cognitive Testing  Alert? Yes Normal Appearance?Yes  Oriented to person? Yes Place? Yes  Time? Yes  Displays appropriate judgment?Yes  Can read the correct time from a watch face? yes Are you having problems  remembering things?No  Advanced Directives have been discussed with the patient?Yes    List the Names of Other Physician/Practitioners you currently use: dr Radford Pax, optometrist   Indicate any recent Medical Services you may have received from other than [facility] providers in the past year (date may be approximate). Fall 2012  Assessment:    Annual Wellness Exam   Plan:    During the course of the visit the patient was educated and counseled about appropriate screening and preventive services including:  A healthy diet is rich in fruit, vegetables and whole grains. Poultry fish, nuts and beans are a healthy choice for protein rather then red meat. A low sodium diet and drinking 64 ounces of water daily is generally recommended. Oils and sweet should be limited. Carbohydrates especially for those who are diabetic or overweight, should be limited to 30-45 gram per meal. It is important to eat on a regular schedule, at least 3 times daily. Snacks should be primarily fruits, vegetables or nuts. It is important that you exercise regularly at least 30 minutes 5 times a week. If you develop chest pain, have severe difficulty breathing, or feel very tired, stop exercising immediately and seek medical attention  Immunization reviewed and updated. Cancer screening reviewed and updated    Patient Instructions (the written plan) was given to the patient.  Medicare Attestation  I have personally reviewed:  The patient's medical and social history  Their use of alcohol, tobacco or illicit drugs  Their current medications and supplements  The patient's functional ability including  ADLs,fall risks, home safety risks, cognitive, and hearing and visual impairment  Diet and physical activities  Evidence for depression or mood disorders  The patient's weight, height, BMI, and visual acuity have been recorded in the chart. I have made referrals, counseling, and provided education to the patient based on  review of the above and I have provided the patient with a written personalized care plan for preventive services.   Chronic health conditions and recent labs were also reviewed. Pt is referred for colonoscopy , which is due     Review of Systems     Objective:   Physical Exam        Assessment & Plan:

## 2012-01-17 NOTE — Assessment & Plan Note (Signed)
Pt counseled re need for daily exercise, she is already committed to 6 days per week, and is changing food choices with improved health and excellent weight loss Immunization is up to date, and she is referred for colonoscopy

## 2012-01-17 NOTE — Assessment & Plan Note (Signed)
Improved no change in medication at this time, pt applauded on weight loss also

## 2012-01-17 NOTE — Assessment & Plan Note (Signed)
Hyperlipidemia:Low fat diet discussed and encouraged.  Fasting lipid pane and hepatic will be added to recent labs

## 2012-01-17 NOTE — Patient Instructions (Addendum)
F/u in December Fasting lipid, cmp and EGFr and TSH and HBa1C in December before visit  Call for flu vaccine in October .  No med changes at this time.  You are referred for average risk screening colonoscopy   Call if you need me before.  Congrats on improved labs and weight loss.  Please think about advance directives, and make some decisions , as we discussed.

## 2012-01-17 NOTE — Assessment & Plan Note (Signed)
Controlled, no change in medication  

## 2012-01-18 ENCOUNTER — Encounter (INDEPENDENT_AMBULATORY_CARE_PROVIDER_SITE_OTHER): Payer: Self-pay | Admitting: *Deleted

## 2012-02-03 ENCOUNTER — Other Ambulatory Visit (INDEPENDENT_AMBULATORY_CARE_PROVIDER_SITE_OTHER): Payer: Self-pay | Admitting: *Deleted

## 2012-02-03 ENCOUNTER — Encounter (INDEPENDENT_AMBULATORY_CARE_PROVIDER_SITE_OTHER): Payer: Self-pay | Admitting: *Deleted

## 2012-02-03 ENCOUNTER — Telehealth (INDEPENDENT_AMBULATORY_CARE_PROVIDER_SITE_OTHER): Payer: Self-pay | Admitting: *Deleted

## 2012-02-03 DIAGNOSIS — Z1211 Encounter for screening for malignant neoplasm of colon: Secondary | ICD-10-CM

## 2012-02-03 NOTE — Telephone Encounter (Signed)
Patient needs movi prep 

## 2012-02-04 MED ORDER — PEG-KCL-NACL-NASULF-NA ASC-C 100 G PO SOLR: 1.0000 | Freq: Once | ORAL | Status: DC

## 2012-02-15 ENCOUNTER — Telehealth (INDEPENDENT_AMBULATORY_CARE_PROVIDER_SITE_OTHER): Payer: Self-pay | Admitting: *Deleted

## 2012-02-15 DIAGNOSIS — Z1211 Encounter for screening for malignant neoplasm of colon: Secondary | ICD-10-CM

## 2012-02-15 NOTE — Telephone Encounter (Signed)
Patient needs trilyte, movi prep too expensive

## 2012-02-18 MED ORDER — PEG 3350-KCL-NA BICARB-NACL 420 G PO SOLR: 4000.0000 mL | Freq: Once | ORAL | Status: AC

## 2012-03-04 ENCOUNTER — Other Ambulatory Visit: Payer: Self-pay | Admitting: Family Medicine

## 2012-03-15 ENCOUNTER — Telehealth (INDEPENDENT_AMBULATORY_CARE_PROVIDER_SITE_OTHER): Payer: Self-pay | Admitting: *Deleted

## 2012-03-15 NOTE — Telephone Encounter (Signed)
PCP/Requesting MD: simpson  Name & DOB: Renee Collins 10/18/1940     Procedure: tcs  Reason/Indication:  screening  Has patient had this procedure before?  yes  If so, when, by whom and where?  10 yrs  Is there a family history of colon cancer?  no  Who?  What age when diagnosed?    Is patient diabetic?   yes      Does patient have prosthetic heart valve?  no  Do you have a pacemaker?  no  Has patient had joint replacement within last 12 months?  no  Is patient on Coumadin, Plavix and/or Aspirin? yes  Medications: asa 81 mg daily, synthroid 50 mg daily, omeprazole 20 mg bid, Wellbutrin 150 mg bid ron, hctz 25 mg daily, benazepril 20 mg daily, metformin 500 mg daily, amlodipine 10 mg daily, calcium daily, pravastatin 20 mg daily  Allergies: nkda  Medication Adjustment: asa 2 days, 1/2 metformin day before  Procedure date & time: 03/30/12 at 730

## 2012-03-16 NOTE — Telephone Encounter (Signed)
agree

## 2012-03-22 ENCOUNTER — Encounter (HOSPITAL_COMMUNITY): Payer: Self-pay | Admitting: Pharmacy Technician

## 2012-03-30 ENCOUNTER — Ambulatory Visit (HOSPITAL_COMMUNITY)
Admission: RE | Admit: 2012-03-30 | Discharge: 2012-03-30 | Disposition: A | Discharge status: Home / Self Care | Payer: Medicare Other | Source: Ambulatory Visit | Attending: Internal Medicine | Admitting: Internal Medicine

## 2012-03-30 ENCOUNTER — Encounter (HOSPITAL_COMMUNITY)
Admission: RE | Disposition: A | Discharge status: Home / Self Care | Payer: Self-pay | Source: Ambulatory Visit | Attending: Internal Medicine

## 2012-03-30 DIAGNOSIS — K644 Residual hemorrhoidal skin tags: Secondary | ICD-10-CM | POA: Insufficient documentation

## 2012-03-30 DIAGNOSIS — E785 Hyperlipidemia, unspecified: Secondary | ICD-10-CM | POA: Insufficient documentation

## 2012-03-30 DIAGNOSIS — D126 Benign neoplasm of colon, unspecified: Secondary | ICD-10-CM | POA: Insufficient documentation

## 2012-03-30 DIAGNOSIS — K648 Other hemorrhoids: Secondary | ICD-10-CM

## 2012-03-30 DIAGNOSIS — E119 Type 2 diabetes mellitus without complications: Secondary | ICD-10-CM | POA: Insufficient documentation

## 2012-03-30 DIAGNOSIS — K5730 Diverticulosis of large intestine without perforation or abscess without bleeding: Secondary | ICD-10-CM | POA: Insufficient documentation

## 2012-03-30 DIAGNOSIS — I10 Essential (primary) hypertension: Secondary | ICD-10-CM | POA: Insufficient documentation

## 2012-03-30 DIAGNOSIS — Z1211 Encounter for screening for malignant neoplasm of colon: Secondary | ICD-10-CM

## 2012-03-30 HISTORY — PX: COLONOSCOPY: SHX5424

## 2012-03-30 SURGERY — COLONOSCOPY
Anesthesia: Moderate Sedation

## 2012-03-30 MED ORDER — MIDAZOLAM HCL 5 MG/5ML IJ SOLN
INTRAMUSCULAR | Status: AC
  Filled 2012-03-30: qty 10

## 2012-03-30 MED ORDER — SODIUM CHLORIDE 0.45 % IV SOLN
INTRAVENOUS | Status: DC
  Administered 2012-03-30: 20 mL/h via INTRAVENOUS

## 2012-03-30 MED ORDER — MEPERIDINE HCL 50 MG/ML IJ SOLN
INTRAMUSCULAR | Status: AC
  Filled 2012-03-30: qty 1

## 2012-03-30 MED ORDER — STERILE WATER FOR IRRIGATION IR SOLN
Status: DC | PRN
  Administered 2012-03-30: 12:00:00

## 2012-03-30 MED ORDER — MIDAZOLAM HCL 5 MG/5ML IJ SOLN
INTRAMUSCULAR | Status: DC | PRN
  Administered 2012-03-30: 2 mg via INTRAVENOUS
  Administered 2012-03-30: 1 mg via INTRAVENOUS
  Administered 2012-03-30: 2 mg via INTRAVENOUS

## 2012-03-30 MED ORDER — HYDROCORTISONE ACETATE 25 MG RE SUPP: 25.0000 mg | Freq: Every day | RECTAL | Status: DC

## 2012-03-30 MED ORDER — MEPERIDINE HCL 50 MG/ML IJ SOLN
INTRAMUSCULAR | Status: DC | PRN
  Administered 2012-03-30 (×2): 25 mg via INTRAVENOUS

## 2012-03-30 NOTE — H&P (Signed)
Renee Collins is an 71 y.o. female.   Chief Complaint: Patient is here for colonoscopy. HPI: Patient is 71 year old African female who is here for screening colonoscopy. She denies abdominal pain change in her bowel habits or rectal bleeding. Family history is negative for colorectal carcinoma. Her last exam was 10 years ago.  Past Medical History  Diagnosis Date  . Hypertension   . Depression   . Chronic back pain   . Anemia   . Hypothyroidism   . Diabetes mellitus   . Hyperlipidemia   . GERD (gastroesophageal reflux disease)     Past Surgical History  Procedure Date  . Laminectomy 1980's  . Total abdominal hysterectomy approx 1993    fibroids   . Spine surgery approx 1983    dr Joya Salm    Family History  Problem Relation Age of Onset  . Heart attack Mother   . Heart disease Mother   . Cancer Mother     type unknown  . Diabetes Mother   . Kidney failure Father   . Kidney disease Father   . Myasthenia gravis Brother   . Cancer Sister     breast  . Diabetes Sister   . Heart disease Brother    Social History:  reports that she has never smoked. She does not have any smokeless tobacco history on file. She reports that she does not drink alcohol or use illicit drugs.  Allergies: No Known Allergies  Medications Prior to Admission  Medication Sig Dispense Refill  . amLODipine (NORVASC) 10 MG tablet Take 1 tablet (10 mg total) by mouth daily.  30 tablet  11  . aspirin (ASPIRIN ADULT LOW STRENGTH) 81 MG EC tablet Take 81 mg by mouth daily.        . benazepril (LOTENSIN) 20 MG tablet Take 1 tablet (20 mg total) by mouth daily.  30 tablet  11  . buPROPion (WELLBUTRIN SR) 150 MG 12 hr tablet Take 150-300 mg by mouth daily.      . Calcium Carbonate-Vit D-Min (CALCIUM 1200) 1200-1000 MG-UNIT CHEW Chew 1 tablet by mouth 2 (two) times daily.        . Ferrous Sulfate (IRON) 325 (65 FE) MG TABS Take 1 tablet by mouth daily.       . fluticasone (FLONASE) 50 MCG/ACT nasal spray Place  2 sprays into the nose daily.       Marland Kitchen GLUCOPHAGE 500 MG tablet TAKE (1) TABLET BY MOUTH EACH MORNING.  30 each  5  . hydrochlorothiazide (HYDRODIURIL) 25 MG tablet TAKE ONE TABLET BY MOUTH ONCE DAILY.  90 tablet  3  . ibuprofen (ADVIL,MOTRIN) 800 MG tablet Take 800 mg by mouth every 8 (eight) hours as needed. Pain.      Marland Kitchen levothyroxine (SYNTHROID) 50 MCG tablet       . Multiple Vitamins-Minerals (ONE-A-DAY EXTRAS ANTIOXIDANT) CAPS Take 1 capsule by mouth daily.       . Nutritional Supplements (ESTROVEN PO) Take 1 tablet by mouth 2 (two) times daily.      Marland Kitchen omeprazole (PRILOSEC) 20 MG capsule TAKE 1 CAPSULE BY MOUTH  TWICE DAILY FOR ACIDREFLUX.  60 capsule  5  . pravastatin (PRAVACHOL) 20 MG tablet Take 1 tablet (20 mg total) by mouth every evening.  30 tablet  11  . traMADol (ULTRAM) 50 MG tablet Take 50 mg by mouth daily as needed. Hand pain.      Marland Kitchen ACCU-CHEK AVIVA PLUS test strip USE ONCE DAILY AS DIRECTED.  50 each  10  . Lancets (ACCU-CHEK MULTICLIX) lancets USE ONCE DAILY AS DIRECTED.  102 each  2  . polyethylene glycol-electrolytes (NULYTELY/GOLYTELY) 420 G solution Take 4,000 mLs by mouth once.        No results found for this or any previous visit (from the past 48 hour(s)). No results found.  ROS  Blood pressure 150/80, pulse 96, temperature 97.7 F (36.5 C), temperature source Oral, resp. rate 17, SpO2 100.00%. Physical Exam  Constitutional: She appears well-developed and well-nourished.  HENT:  Mouth/Throat: Oropharynx is clear and moist.  Eyes: Conjunctivae normal are normal. No scleral icterus.  Neck: No thyromegaly present.  Cardiovascular: Normal rate, regular rhythm and normal heart sounds.   No murmur heard. Respiratory: Effort normal and breath sounds normal.  GI: Soft. She exhibits no distension and no mass. There is no tenderness.  Musculoskeletal: She exhibits no edema.  Lymphadenopathy:    She has no cervical adenopathy.  Neurological: She is alert.  Skin:  Skin is warm and dry.     Assessment/Plan Average risk screening colonoscopy.  REHMAN,NAJEEB U 03/30/2012, 12:04 PM

## 2012-03-30 NOTE — Op Note (Signed)
COLONOSCOPY PROCEDURE REPORT  PATIENT:  Renee Collins  MR#:  [id number] Birthdate:  1941-03-27, 71 y.o., female Endoscopist:  Dr. Rogene Houston, MD Referred By:  Dr. [name], MD Procedure Date: 03/30/2012  Procedure:   Colonoscopy  Indications: Patient is 71 year old African female was undergoing average risk screening colonoscopy. Patient's last exam was 10 years ago.  Informed Consent:  The procedure and risks were reviewed with the patient and informed consent was obtained.  Medications:  Demerol 50 mg IV Versed 5 mg IV  Description of procedure:  After a digital rectal exam was performed, that colonoscope was advanced from the anus through the rectum and colon to the area of the cecum, ileocecal valve and appendiceal orifice. The cecum was deeply intubated. These structures were well-seen and photographed for the record. From the level of the cecum and ileocecal valve, the scope was slowly and cautiously withdrawn. The mucosal surfaces were carefully surveyed utilizing scope tip to flexion to facilitate fold flattening as needed. The scope was pulled down into the rectum where a thorough exam including retroflexion was performed.  Findings:   Prep satisfactory. Few scattered diverticula at sigmoid colon. Diffuse mucosal pigmentation more prominent in the proximal colon. Small polyp ablated via cold biopsy from sigmoid colon. Normal rectal mucosa. Dominant hemorrhoids above and below the dentate line.  Therapeutic/Diagnostic Maneuvers Performed:  See above  Complications:  None  Cecal Withdrawal Time:  12 minutes  Impression:  Examination performed to cecum. Mild sigmoid colon diverticulosis. Small polyp ablated via cold biopsy from sigmoid colon. Melanosis coli. External and internal hemorrhoids.  Recommendations:  Standard instructions given. High fiber diet. Anusol-HC suppository 1 per rectum daily at bedtime for 2 weeks. I will contact patient with results  of biopsy. Given today's findings she could wait 10 years before her next exam.  REHMAN,NAJEEB U  03/30/2012 12:36 PM  CC: Dr. [name], MD & Dr. Rayne Du ref. provider found

## 2012-04-05 ENCOUNTER — Encounter (HOSPITAL_COMMUNITY): Payer: Self-pay | Admitting: Internal Medicine

## 2012-04-12 ENCOUNTER — Ambulatory Visit (INDEPENDENT_AMBULATORY_CARE_PROVIDER_SITE_OTHER): Payer: Medicare Other

## 2012-04-12 DIAGNOSIS — Z23 Encounter for immunization: Secondary | ICD-10-CM

## 2012-04-20 ENCOUNTER — Other Ambulatory Visit: Payer: Self-pay | Admitting: Family Medicine

## 2012-04-20 DIAGNOSIS — IMO0001 Reserved for inherently not codable concepts without codable children: Secondary | ICD-10-CM

## 2012-04-25 ENCOUNTER — Ambulatory Visit (HOSPITAL_COMMUNITY)
Admission: RE | Admit: 2012-04-25 | Discharge: 2012-04-25 | Disposition: A | Discharge status: Home / Self Care | Payer: Medicare Other | Source: Ambulatory Visit | Attending: Family Medicine | Admitting: Family Medicine

## 2012-04-25 DIAGNOSIS — IMO0001 Reserved for inherently not codable concepts without codable children: Secondary | ICD-10-CM

## 2012-04-25 DIAGNOSIS — Z1231 Encounter for screening mammogram for malignant neoplasm of breast: Secondary | ICD-10-CM | POA: Insufficient documentation

## 2012-05-10 ENCOUNTER — Ambulatory Visit: Payer: Medicare Other

## 2012-05-31 ENCOUNTER — Other Ambulatory Visit: Payer: Self-pay

## 2012-05-31 DIAGNOSIS — E785 Hyperlipidemia, unspecified: Secondary | ICD-10-CM

## 2012-05-31 MED ORDER — PRAVASTATIN SODIUM 20 MG PO TABS: 20.0000 mg | ORAL_TABLET | Freq: Every evening | ORAL | Status: DC

## 2012-05-31 MED ORDER — METFORMIN HCL 500 MG PO TABS: 500.0000 mg | ORAL_TABLET | Freq: Every day | ORAL | Status: DC

## 2012-06-12 ENCOUNTER — Other Ambulatory Visit: Payer: Self-pay | Admitting: Family Medicine

## 2012-06-13 LAB — LIPID PANEL
Cholesterol: 149 mg/dL (ref 0–200)
HDL: 53 mg/dL (ref >39)
Total CHOL/HDL Ratio: 2.8 Ratio
VLDL: 13 mg/dL (ref 0–40)

## 2012-06-13 LAB — HEMOGLOBIN A1C: Hgb A1c MFr Bld: 6.4 % — ABNORMAL HIGH (ref <5.7)

## 2012-06-13 LAB — COMPLETE METABOLIC PANEL WITH GFR
AST: 19 U/L (ref 0–37)
Albumin: 4.1 g/dL (ref 3.5–5.2)
Alkaline Phosphatase: 76 U/L (ref 39–117)
GFR, Est Non African American: 48 mL/min — ABNORMAL LOW
Potassium: 3.8 mEq/L (ref 3.5–5.3)
Sodium: 138 mEq/L (ref 135–145)
Total Bilirubin: 0.3 mg/dL (ref 0.3–1.2)
Total Protein: 8.1 g/dL (ref 6.0–8.3)

## 2012-06-13 LAB — TSH: TSH: 5.096 u[IU]/mL — ABNORMAL HIGH (ref 0.350–4.500)

## 2012-06-15 ENCOUNTER — Ambulatory Visit (INDEPENDENT_AMBULATORY_CARE_PROVIDER_SITE_OTHER): Payer: Medicare Other | Admitting: Family Medicine

## 2012-06-15 ENCOUNTER — Encounter: Payer: Self-pay | Admitting: Family Medicine

## 2012-06-15 VITALS — BP 130/80 | HR 83 | Resp 16 | Ht 65.0 in | Wt 189.0 lb

## 2012-06-15 DIAGNOSIS — E039 Hypothyroidism, unspecified: Secondary | ICD-10-CM

## 2012-06-15 DIAGNOSIS — F3289 Other specified depressive episodes: Secondary | ICD-10-CM

## 2012-06-15 DIAGNOSIS — I10 Essential (primary) hypertension: Secondary | ICD-10-CM

## 2012-06-15 DIAGNOSIS — E119 Type 2 diabetes mellitus without complications: Secondary | ICD-10-CM

## 2012-06-15 DIAGNOSIS — E669 Obesity, unspecified: Secondary | ICD-10-CM

## 2012-06-15 DIAGNOSIS — E785 Hyperlipidemia, unspecified: Secondary | ICD-10-CM

## 2012-06-15 DIAGNOSIS — F329 Major depressive disorder, single episode, unspecified: Secondary | ICD-10-CM

## 2012-06-15 NOTE — Progress Notes (Signed)
  Subjective:    Patient ID: Renee Collins, female    DOB: 12-Feb-1941, 71 y.o.   MRN: [id number]  HPI The PT is here for follow up and re-evaluation of chronic medical conditions, medication management and review of any available recent lab and radiology data.  Preventive health is updated, specifically  Cancer screening and Immunization.   Questions or concerns regarding consultations or procedures which the PT has had in the interim are  addressed. The PT denies any adverse reactions to current medications since the last visit.  There are no new concerns.  There are no specific complaints       Review of Systems See HPI Denies recent fever or chills. Denies sinus pressure, nasal congestion, ear pain or sore throat. Denies chest congestion, productive cough or wheezing. Denies chest pains, palpitations and leg swelling Denies abdominal pain, nausea, vomiting,diarrhea or constipation.   Denies dysuria, frequency, hesitancy or incontinence. Denies joint pain, swelling and limitation in mobility. Denies headaches, seizures, numbness, or tingling. Denies depression, anxiety or insomnia. Denies skin break down or rash.        Objective:   Physical Exam  Patient alert and oriented and in no cardiopulmonary distress.  HEENT: No facial asymmetry, EOMI, no sinus tenderness,  oropharynx pink and moist.  Neck supple no adenopathy.  Chest: Clear to auscultation bilaterally.  CVS: S1, S2 no murmurs, no S3.  ABD: Soft non tender. Bowel sounds normal.  Ext: No edema  MS: Adequate ROM spine, shoulders, hips and knees.  Skin: Intact, no ulcerations or rash noted.  Psych: Good eye contact, normal affect. Memory intact not anxious or depressed appearing.  CNS: CN 2-12 intact, power, tone and sensation normal throughout.       Assessment & Plan:

## 2012-06-15 NOTE — Patient Instructions (Addendum)
Annual wellness in 4 month, pls call if you need me before  It is important that you exercise regularly at least 30 minutes 5 times a week. If you develop chest pain, have severe difficulty breathing, or feel very tired, stop exercising immediately and seek medical attention   A healthy diet is rich in fruit, vegetables and whole grains. Poultry fish, nuts and beans are a healthy choice for protein rather then red meat. A low sodium diet and drinking 64 ounces of water daily is generally recommended. Oils and sweet should be limited. Carbohydrates especially for those who are diabetic or overweight, should be limited to 3-45 gram per meal. It is important to eat on a regular schedule, at least 3 times daily. Snacks should be primarily fruits, vegetables or nuts.  Weight loss goal of 6 pounds   Bloodwork and blood pressure are excellent, no med changes   HBA1C, chem 7 and TSH in 4. Month  Dose increase in levothyroxine 36mcg one and a half tablets on Tuesday, Thursday, and Saturday, continue one tablet Monday, Wednesday, Friday and Sunday

## 2012-06-30 ENCOUNTER — Telehealth: Payer: Self-pay | Admitting: Family Medicine

## 2012-06-30 ENCOUNTER — Other Ambulatory Visit: Payer: Self-pay

## 2012-06-30 DIAGNOSIS — E039 Hypothyroidism, unspecified: Secondary | ICD-10-CM

## 2012-06-30 DIAGNOSIS — I10 Essential (primary) hypertension: Secondary | ICD-10-CM

## 2012-06-30 DIAGNOSIS — E785 Hyperlipidemia, unspecified: Secondary | ICD-10-CM

## 2012-06-30 MED ORDER — AMLODIPINE BESYLATE 10 MG PO TABS: 10.0000 mg | ORAL_TABLET | Freq: Every day | ORAL | Status: DC

## 2012-06-30 MED ORDER — BUPROPION HCL ER (SR) 150 MG PO TB12: 150.0000 mg | ORAL_TABLET | Freq: Every day | ORAL | Status: DC

## 2012-06-30 MED ORDER — LEVOTHYROXINE SODIUM 50 MCG PO TABS: ORAL_TABLET | ORAL | Status: DC

## 2012-06-30 MED ORDER — OMEPRAZOLE 20 MG PO CPDR: DELAYED_RELEASE_CAPSULE | ORAL | Status: DC

## 2012-06-30 MED ORDER — HYDROCHLOROTHIAZIDE 25 MG PO TABS: ORAL_TABLET | ORAL | Status: DC

## 2012-06-30 MED ORDER — BENAZEPRIL HCL 20 MG PO TABS: 20.0000 mg | ORAL_TABLET | Freq: Every day | ORAL | Status: DC

## 2012-06-30 MED ORDER — METFORMIN HCL 500 MG PO TABS: 500.0000 mg | ORAL_TABLET | Freq: Every day | ORAL | Status: DC

## 2012-06-30 MED ORDER — PRAVASTATIN SODIUM 20 MG PO TABS: 20.0000 mg | ORAL_TABLET | Freq: Every evening | ORAL | Status: DC

## 2012-06-30 NOTE — Telephone Encounter (Signed)
Faxed

## 2012-07-03 ENCOUNTER — Other Ambulatory Visit: Payer: Self-pay

## 2012-07-03 ENCOUNTER — Telehealth: Payer: Self-pay | Admitting: Family Medicine

## 2012-07-04 ENCOUNTER — Encounter: Payer: Self-pay | Admitting: Family Medicine

## 2012-07-04 ENCOUNTER — Other Ambulatory Visit: Payer: Self-pay

## 2012-07-04 DIAGNOSIS — E785 Hyperlipidemia, unspecified: Secondary | ICD-10-CM

## 2012-07-04 DIAGNOSIS — I10 Essential (primary) hypertension: Secondary | ICD-10-CM

## 2012-07-04 DIAGNOSIS — E039 Hypothyroidism, unspecified: Secondary | ICD-10-CM

## 2012-07-04 MED ORDER — BENAZEPRIL HCL 20 MG PO TABS: 20.0000 mg | ORAL_TABLET | Freq: Every day | ORAL | Status: DC

## 2012-07-04 MED ORDER — BUPROPION HCL ER (SR) 150 MG PO TB12: 150.0000 mg | ORAL_TABLET | Freq: Every day | ORAL | Status: DC

## 2012-07-04 MED ORDER — PRAVASTATIN SODIUM 20 MG PO TABS: 20.0000 mg | ORAL_TABLET | Freq: Every evening | ORAL | Status: DC

## 2012-07-04 MED ORDER — HYDROCHLOROTHIAZIDE 25 MG PO TABS: ORAL_TABLET | ORAL | Status: DC

## 2012-07-04 MED ORDER — METFORMIN HCL 500 MG PO TABS: 500.0000 mg | ORAL_TABLET | Freq: Every day | ORAL | Status: DC

## 2012-07-04 MED ORDER — LEVOTHYROXINE SODIUM 50 MCG PO TABS: ORAL_TABLET | ORAL | Status: DC

## 2012-07-04 MED ORDER — OMEPRAZOLE 20 MG PO CPDR: DELAYED_RELEASE_CAPSULE | ORAL | Status: DC

## 2012-07-04 MED ORDER — AMLODIPINE BESYLATE 10 MG PO TABS: 10.0000 mg | ORAL_TABLET | Freq: Every day | ORAL | Status: DC

## 2012-07-04 NOTE — Telephone Encounter (Signed)
i received a previous message stating to send "all" her meds to pharmacy and this was done

## 2012-07-05 NOTE — Telephone Encounter (Signed)
Rx sent to appropriate pharmacy 

## 2012-07-08 ENCOUNTER — Encounter: Payer: Self-pay | Admitting: Family Medicine

## 2012-07-09 NOTE — Assessment & Plan Note (Signed)
Hyperlipidemia:Low fat diet discussed and encouraged.  Controlled, no change in medication   

## 2012-07-09 NOTE — Assessment & Plan Note (Signed)
Deteriorated. Patient re-educated about  the importance of commitment to a  minimum of 150 minutes of exercise per week. The importance of healthy food choices with portion control discussed. Encouraged to start a food diary, count calories and to consider  joining a support group. Sample diet sheets offered. Goals set by the patient for the next several months.    

## 2012-07-09 NOTE — Assessment & Plan Note (Signed)
Under corrected, dose increase in supplement

## 2012-07-09 NOTE — Assessment & Plan Note (Signed)
Controlled, no change in medication  

## 2012-07-09 NOTE — Assessment & Plan Note (Signed)
Controlled, no change in medication DASH diet and commitment to daily physical activity for a minimum of 30 minutes discussed and encouraged, as a part of hypertension management. The importance of attaining a healthy weight is also discussed.  

## 2012-07-10 MED ORDER — BUPROPION HCL ER (SR) 150 MG PO TB12: 150.0000 mg | ORAL_TABLET | Freq: Every day | ORAL | Status: DC

## 2012-07-10 MED ORDER — OMEPRAZOLE 20 MG PO CPDR: DELAYED_RELEASE_CAPSULE | ORAL | Status: DC

## 2012-08-02 ENCOUNTER — Other Ambulatory Visit: Payer: Self-pay | Admitting: Family Medicine

## 2012-09-26 ENCOUNTER — Other Ambulatory Visit: Payer: Self-pay

## 2012-09-28 LAB — BASIC METABOLIC PANEL
CO2: 27 mEq/L (ref 19–32)
Chloride: 106 mEq/L (ref 96–112)
Creat: 1.25 mg/dL — ABNORMAL HIGH (ref 0.50–1.10)
Potassium: 3.8 mEq/L (ref 3.5–5.3)

## 2012-09-28 LAB — TSH: TSH: 3.665 u[IU]/mL (ref 0.350–4.500)

## 2012-09-28 LAB — HEMOGLOBIN A1C: Mean Plasma Glucose: 137 mg/dL — ABNORMAL HIGH (ref <117)

## 2012-10-02 ENCOUNTER — Encounter: Payer: Self-pay | Admitting: Family Medicine

## 2012-10-02 ENCOUNTER — Ambulatory Visit (INDEPENDENT_AMBULATORY_CARE_PROVIDER_SITE_OTHER): Payer: Medicare Other | Admitting: Family Medicine

## 2012-10-02 VITALS — BP 130/80 | HR 72 | Resp 16 | Ht 65.0 in | Wt 188.0 lb

## 2012-10-02 DIAGNOSIS — F329 Major depressive disorder, single episode, unspecified: Secondary | ICD-10-CM

## 2012-10-02 DIAGNOSIS — R5381 Other malaise: Secondary | ICD-10-CM

## 2012-10-02 DIAGNOSIS — E785 Hyperlipidemia, unspecified: Secondary | ICD-10-CM

## 2012-10-02 DIAGNOSIS — D649 Anemia, unspecified: Secondary | ICD-10-CM

## 2012-10-02 DIAGNOSIS — E559 Vitamin D deficiency, unspecified: Secondary | ICD-10-CM

## 2012-10-02 DIAGNOSIS — I10 Essential (primary) hypertension: Secondary | ICD-10-CM

## 2012-10-02 DIAGNOSIS — E119 Type 2 diabetes mellitus without complications: Secondary | ICD-10-CM

## 2012-10-02 DIAGNOSIS — E039 Hypothyroidism, unspecified: Secondary | ICD-10-CM

## 2012-10-02 DIAGNOSIS — E669 Obesity, unspecified: Secondary | ICD-10-CM

## 2012-10-02 DIAGNOSIS — F3289 Other specified depressive episodes: Secondary | ICD-10-CM

## 2012-10-02 NOTE — Patient Instructions (Addendum)
Annual wellness in mid October, with rectal, pls call if you need me before  It is important that you exercise regularly at least 30 minutes 6 times a week. If you develop chest pain, have severe difficulty breathing, or feel very tired, stop exercising immediately and seek medical attention   A healthy diet is rich in fruit, vegetables and whole grains. Poultry fish, nuts and beans are a healthy choice for protein rather then red meat. A low sodium diet and drinking 64 ounces of water daily is generally recommended. Oils and sweet should be limited. Carbohydrates especially for those who are diabetic or overweight, should be limited to 30-45 gram per meal. It is important to eat on a regular schedule, at least 3 times daily. Snacks should be primarily fruits, vegetables or nuts.  Weight loss goal of 6 to 10 pounds in the next 6 month  No med changes  Fasting lipid, cmp and eGFR, HBA1C tsh and CBc mid October    Microalb today.  Please schedule your eye exam , this is past due

## 2012-10-02 NOTE — Progress Notes (Signed)
  Subjective:    Patient ID: Renee Collins, female    DOB: [birth year], 72 y.o.   MRN: [id number]  HPI The PT is here for follow up and re-evaluation of chronic medical conditions, medication management and review of any available recent lab and radiology data.  Preventive health is updated, specifically  Cancer screening and Immunization.   Questions or concerns regarding consultations or procedures which the PT has had in the interim are  addressed. The PT denies any adverse reactions to current medications since the last visit.  There are no new concerns.  There are no specific complaints       Review of Systems    See HPI Denies recent fever or chills. Denies sinus pressure, nasal congestion, ear pain or sore throat. Denies chest congestion, productive cough or wheezing. Denies chest pains, palpitations and leg swelling Denies abdominal pain, nausea, vomiting,diarrhea or constipation.   Denies dysuria, frequency, hesitancy or incontinence. Denies joint pain, swelling and limitation in mobility. Denies headaches, seizures, numbness, or tingling. Denies depression, anxiety or insomnia. Denies skin break down or rash.     Objective:   Physical Exam  Patient alert and oriented and in no cardiopulmonary distress.  HEENT: No facial asymmetry, EOMI, no sinus tenderness,  oropharynx pink and moist.  Neck supple no adenopathy.  Chest: Clear to auscultation bilaterally.  CVS: S1, S2 no murmurs, no S3.  ABD: Soft non tender. Bowel sounds normal.  Ext: No edema  MS: Adequate ROM spine, shoulders, hips and knees.  Skin: Intact, no ulcerations or rash noted.  Psych: Good eye contact, normal affect. Memory intact not anxious or depressed appearing.  CNS: CN 2-12 intact, power, tone and sensation normal throughout.       Assessment & Plan:

## 2012-10-03 LAB — MICROALBUMIN / CREATININE URINE RATIO
Creatinine, Urine: 201.6 mg/dL
Microalb Creat Ratio: 88.6 mg/g — ABNORMAL HIGH (ref 0.0–30.0)
Microalb, Ur: 17.86 mg/dL — ABNORMAL HIGH (ref 0.00–1.89)

## 2012-10-08 NOTE — Assessment & Plan Note (Signed)
Controlled, no change in medication DASH diet and commitment to daily physical activity for a minimum of 30 minutes discussed and encouraged, as a part of hypertension management. The importance of attaining a healthy weight is also discussed.  

## 2012-10-08 NOTE — Assessment & Plan Note (Signed)
Controlled, no change in medication  

## 2012-10-08 NOTE — Assessment & Plan Note (Signed)
Hyperlipidemia:Low fat diet discussed and encouraged.  Controlled, no change in medication   

## 2012-10-08 NOTE — Assessment & Plan Note (Addendum)
Unchanged. Patient re-educated about  the importance of commitment to a  minimum of 150 minutes of exercise per week. The importance of healthy food choices with portion control discussed. Encouraged to start a food diary, count calories and to consider  joining a support group. Sample diet sheets offered. Goals set by the patient for the next several months.    

## 2012-10-08 NOTE — Assessment & Plan Note (Signed)
Controlled, no change in medication Patient advised to reduce carb and sweets, commit to regular physical activity, take meds as prescribed, test blood as directed, and attempt to lose weight, to improve blood sugar control.  

## 2012-11-21 ENCOUNTER — Encounter: Payer: Self-pay | Admitting: Family Medicine

## 2013-01-03 ENCOUNTER — Encounter: Payer: Self-pay | Admitting: Family Medicine

## 2013-01-03 DIAGNOSIS — I10 Essential (primary) hypertension: Secondary | ICD-10-CM

## 2013-01-03 MED ORDER — BENAZEPRIL HCL 20 MG PO TABS: 20.0000 mg | ORAL_TABLET | Freq: Every day | ORAL | Status: DC

## 2013-01-03 MED ORDER — OMEPRAZOLE 20 MG PO CPDR: DELAYED_RELEASE_CAPSULE | ORAL | Status: DC

## 2013-01-03 MED ORDER — HYDROCHLOROTHIAZIDE 25 MG PO TABS: ORAL_TABLET | ORAL | Status: DC

## 2013-01-03 MED ORDER — AMLODIPINE BESYLATE 10 MG PO TABS: 10.0000 mg | ORAL_TABLET | Freq: Every day | ORAL | Status: DC

## 2013-01-03 MED ORDER — METFORMIN HCL 500 MG PO TABS: 500.0000 mg | ORAL_TABLET | Freq: Every day | ORAL | Status: DC

## 2013-01-03 NOTE — Telephone Encounter (Signed)
meds sent

## 2013-01-04 ENCOUNTER — Other Ambulatory Visit: Payer: Self-pay

## 2013-02-22 ENCOUNTER — Other Ambulatory Visit: Payer: Self-pay

## 2013-02-22 ENCOUNTER — Encounter: Payer: Self-pay | Admitting: Family Medicine

## 2013-02-22 DIAGNOSIS — E039 Hypothyroidism, unspecified: Secondary | ICD-10-CM

## 2013-02-22 MED ORDER — LEVOTHYROXINE SODIUM 50 MCG PO TABS: ORAL_TABLET | ORAL | Status: DC

## 2013-03-26 ENCOUNTER — Other Ambulatory Visit: Payer: Self-pay | Admitting: Family Medicine

## 2013-03-26 LAB — CBC WITH DIFFERENTIAL/PLATELET
Basophils Absolute: 0 10*3/uL (ref 0.0–0.1)
Basophils Relative: 1 % (ref 0–1)
Eosinophils Absolute: 0.2 10*3/uL (ref 0.0–0.7)
Eosinophils Relative: 6 % — ABNORMAL HIGH (ref 0–5)
Lymphocytes Relative: 35 % (ref 12–46)
MCV: 81.5 fL (ref 78.0–100.0)
Platelets: 221 10*3/uL (ref 150–400)
RDW: 14.1 % (ref 11.5–15.5)
WBC: 3.4 10*3/uL — ABNORMAL LOW (ref 4.0–10.5)

## 2013-03-26 LAB — COMPLETE METABOLIC PANEL WITH GFR
AST: 23 U/L (ref 0–37)
Albumin: 4.1 g/dL (ref 3.5–5.2)
BUN: 23 mg/dL (ref 6–23)
Calcium: 9.6 mg/dL (ref 8.4–10.5)
Chloride: 103 mEq/L (ref 96–112)
Glucose, Bld: 100 mg/dL — ABNORMAL HIGH (ref 70–99)
Potassium: 4 mEq/L (ref 3.5–5.3)

## 2013-03-26 LAB — HEMOGLOBIN A1C
Hgb A1c MFr Bld: 6.4 % — ABNORMAL HIGH (ref <5.7)
Mean Plasma Glucose: 137 mg/dL — ABNORMAL HIGH (ref <117)

## 2013-03-26 LAB — LIPID PANEL
HDL: 62 mg/dL (ref >39)
LDL Cholesterol: 76 mg/dL (ref 0–99)
Triglycerides: 45 mg/dL (ref <150)
VLDL: 9 mg/dL (ref 0–40)

## 2013-03-26 LAB — TSH: TSH: 2.179 u[IU]/mL (ref 0.350–4.500)

## 2013-03-28 ENCOUNTER — Other Ambulatory Visit: Payer: Self-pay | Admitting: Family Medicine

## 2013-03-28 DIAGNOSIS — Z139 Encounter for screening, unspecified: Secondary | ICD-10-CM

## 2013-04-09 ENCOUNTER — Ambulatory Visit (INDEPENDENT_AMBULATORY_CARE_PROVIDER_SITE_OTHER): Payer: Medicare Other | Admitting: Family Medicine

## 2013-04-09 ENCOUNTER — Encounter: Payer: Self-pay | Admitting: Family Medicine

## 2013-04-09 VITALS — BP 130/82 | HR 78 | Resp 16 | Ht 65.0 in | Wt 177.0 lb

## 2013-04-09 DIAGNOSIS — Z23 Encounter for immunization: Secondary | ICD-10-CM

## 2013-04-09 DIAGNOSIS — E039 Hypothyroidism, unspecified: Secondary | ICD-10-CM

## 2013-04-09 DIAGNOSIS — H269 Unspecified cataract: Secondary | ICD-10-CM | POA: Insufficient documentation

## 2013-04-09 DIAGNOSIS — F329 Major depressive disorder, single episode, unspecified: Secondary | ICD-10-CM

## 2013-04-09 DIAGNOSIS — I10 Essential (primary) hypertension: Secondary | ICD-10-CM

## 2013-04-09 DIAGNOSIS — F3289 Other specified depressive episodes: Secondary | ICD-10-CM

## 2013-04-09 DIAGNOSIS — E119 Type 2 diabetes mellitus without complications: Secondary | ICD-10-CM

## 2013-04-09 DIAGNOSIS — N183 Chronic kidney disease, stage 3 unspecified: Secondary | ICD-10-CM

## 2013-04-09 DIAGNOSIS — E785 Hyperlipidemia, unspecified: Secondary | ICD-10-CM

## 2013-04-09 MED ORDER — BENAZEPRIL HCL 20 MG PO TABS: 20.0000 mg | ORAL_TABLET | Freq: Every day | ORAL | Status: DC

## 2013-04-09 MED ORDER — HYDROCHLOROTHIAZIDE 25 MG PO TABS: ORAL_TABLET | ORAL | Status: DC

## 2013-04-09 MED ORDER — PRAVASTATIN SODIUM 20 MG PO TABS: 20.0000 mg | ORAL_TABLET | Freq: Every evening | ORAL | Status: DC

## 2013-04-09 MED ORDER — BUPROPION HCL ER (SR) 150 MG PO TB12: ORAL_TABLET | ORAL | Status: DC

## 2013-04-09 MED ORDER — AMLODIPINE BESYLATE 10 MG PO TABS: 10.0000 mg | ORAL_TABLET | Freq: Every day | ORAL | Status: DC

## 2013-04-09 NOTE — Patient Instructions (Addendum)
Pelvic and breast exam in 4 month, call if you need me before  Flu vaccine today  STOP metformin, manage diabetes with regular exercise and healthy food choices  Wellbutrin is reduced to one daily  Test supplies will no longer be ordered    You are referred to nephrologist for further evaluation of your kidneys    Recent labs show excellent blood sugar, lipid, liver and thyroid function.Blood pressure is excellent  hBA1C and chem 7 in 4 month, before visit  All the best with cataract surgery

## 2013-04-15 NOTE — Assessment & Plan Note (Signed)
Refer for nephrology eval. Pt cautioned against use of NSAIDS

## 2013-04-15 NOTE — Progress Notes (Signed)
  Subjective:    Patient ID: Renee Collins, female    DOB: [birth year], 72 y.o.   MRN: [id number]  HPI The PT is here for follow up and re-evaluation of chronic medical conditions, medication management and review of any available recent lab and radiology data.  Preventive health is updated, specifically  Cancer screening and Immunization.   Questions or concerns regarding consultations or procedures which the PT has had in the interim are  Addressed.Has upcoming cataract surgery The PT denies any adverse reactions to current medications since the last visit.  There are no new concerns.  There are no specific complaints       Review of Systems See HPI Denies recent fever or chills. Denies sinus pressure, nasal congestion, ear pain or sore throat. Denies chest congestion, productive cough or wheezing. Denies chest pains, palpitations and leg swelling Denies abdominal pain, nausea, vomiting,diarrhea or constipation.   Denies dysuria, frequency, hesitancy or incontinence. Denies joint pain, swelling and limitation in mobility. Denies headaches, seizures, numbness, or tingling. Denies depression, anxiety or insomnia. Denies skin break down or rash.        Objective:   Physical Exam  Patient alert and oriented and in no cardiopulmonary distress.  HEENT: No facial asymmetry, EOMI, no sinus tenderness,  oropharynx pink and moist.  Neck supple no adenopathy.  Chest: Clear to auscultation bilaterally.  CVS: S1, S2 no murmurs, no S3.  ABD: Soft non tender. Bowel sounds normal.  Ext: No edema  MS: Adequate ROM spine, shoulders, hips and knees.  Skin: Intact, no ulcerations or rash noted.  Psych: Good eye contact, normal affect. Memory intact not anxious or depressed appearing.  CNS: CN 2-12 intact, power, tone and sensation normal throughout.       Assessment & Plan:

## 2013-04-15 NOTE — Assessment & Plan Note (Signed)
Marked improvement , reduce welbutrin dose will likely be able to d/c over next 6 month period.

## 2013-04-15 NOTE — Assessment & Plan Note (Signed)
Controlled, will d/cetformn due to CKD, lifestyle management only at this time

## 2013-04-15 NOTE — Assessment & Plan Note (Signed)
Controlled, no change in medication DASH diet and commitment to daily physical activity for a minimum of 30 minutes discussed and encouraged, as a part of hypertension management. The importance of attaining a healthy weight is also discussed.  

## 2013-04-15 NOTE — Assessment & Plan Note (Signed)
Controlled, no change in medication Hyperlipidemia:Low fat diet discussed and encouraged.  \ 

## 2013-04-15 NOTE — Assessment & Plan Note (Signed)
Controlled, no change in medication  

## 2013-04-26 ENCOUNTER — Ambulatory Visit (HOSPITAL_COMMUNITY)
Admission: RE | Admit: 2013-04-26 | Discharge: 2013-04-26 | Disposition: A | Discharge status: Home / Self Care | Payer: Medicare Other | Source: Ambulatory Visit | Attending: Family Medicine | Admitting: Family Medicine

## 2013-04-26 DIAGNOSIS — Z1231 Encounter for screening mammogram for malignant neoplasm of breast: Secondary | ICD-10-CM | POA: Insufficient documentation

## 2013-04-26 DIAGNOSIS — Z139 Encounter for screening, unspecified: Secondary | ICD-10-CM

## 2013-05-30 ENCOUNTER — Other Ambulatory Visit (HOSPITAL_COMMUNITY): Payer: Self-pay | Admitting: Nephrology

## 2013-05-30 DIAGNOSIS — N289 Disorder of kidney and ureter, unspecified: Secondary | ICD-10-CM

## 2013-06-14 ENCOUNTER — Ambulatory Visit (HOSPITAL_COMMUNITY)
Admission: RE | Admit: 2013-06-14 | Discharge: 2013-06-14 | Disposition: A | Discharge status: Home / Self Care | Payer: Medicare Other | Source: Ambulatory Visit | Attending: Nephrology | Admitting: Nephrology

## 2013-06-14 DIAGNOSIS — N289 Disorder of kidney and ureter, unspecified: Secondary | ICD-10-CM | POA: Insufficient documentation

## 2013-08-08 LAB — BASIC METABOLIC PANEL
BUN: 23 mg/dL (ref 6–23)
CO2: 26 mEq/L (ref 19–32)
Calcium: 9.3 mg/dL (ref 8.4–10.5)
Chloride: 107 mEq/L (ref 96–112)
Creat: 1.22 mg/dL — ABNORMAL HIGH (ref 0.50–1.10)
Glucose, Bld: 91 mg/dL (ref 70–99)
Potassium: 3.9 mEq/L (ref 3.5–5.3)
Sodium: 138 mEq/L (ref 135–145)

## 2013-08-08 LAB — HEMOGLOBIN A1C
Hgb A1c MFr Bld: 6.2 % — ABNORMAL HIGH (ref <5.7)
Mean Plasma Glucose: 131 mg/dL — ABNORMAL HIGH (ref <117)

## 2013-08-13 ENCOUNTER — Other Ambulatory Visit (HOSPITAL_COMMUNITY)
Admission: RE | Admit: 2013-08-13 | Discharge: 2013-08-13 | Disposition: A | Discharge status: Home / Self Care | Payer: Medicare HMO | Source: Ambulatory Visit | Attending: Family Medicine | Admitting: Family Medicine

## 2013-08-13 ENCOUNTER — Encounter: Payer: Self-pay | Admitting: Family Medicine

## 2013-08-13 ENCOUNTER — Encounter (HOSPITAL_COMMUNITY): Payer: Medicare HMO | Attending: Hematology and Oncology

## 2013-08-13 ENCOUNTER — Encounter (HOSPITAL_COMMUNITY): Payer: Medicare HMO

## 2013-08-13 ENCOUNTER — Ambulatory Visit (HOSPITAL_COMMUNITY)
Admission: RE | Admit: 2013-08-13 | Discharge: 2013-08-13 | Disposition: A | Discharge status: Home / Self Care | Payer: Medicare HMO | Source: Ambulatory Visit | Attending: Hematology and Oncology | Admitting: Hematology and Oncology

## 2013-08-13 ENCOUNTER — Encounter (HOSPITAL_COMMUNITY): Payer: Self-pay

## 2013-08-13 ENCOUNTER — Ambulatory Visit (INDEPENDENT_AMBULATORY_CARE_PROVIDER_SITE_OTHER): Payer: Medicare HMO | Admitting: Family Medicine

## 2013-08-13 ENCOUNTER — Ambulatory Visit (HOSPITAL_COMMUNITY): Payer: Self-pay

## 2013-08-13 VITALS — BP 120/74 | HR 96 | Resp 16 | Ht 65.0 in | Wt 182.8 lb

## 2013-08-13 VITALS — BP 138/98 | HR 82 | Temp 97.3°F | Resp 20 | Ht 66.0 in | Wt 182.0 lb

## 2013-08-13 DIAGNOSIS — D509 Iron deficiency anemia, unspecified: Secondary | ICD-10-CM

## 2013-08-13 DIAGNOSIS — N2581 Secondary hyperparathyroidism of renal origin: Secondary | ICD-10-CM

## 2013-08-13 DIAGNOSIS — D500 Iron deficiency anemia secondary to blood loss (chronic): Secondary | ICD-10-CM | POA: Insufficient documentation

## 2013-08-13 DIAGNOSIS — N183 Chronic kidney disease, stage 3 unspecified: Secondary | ICD-10-CM

## 2013-08-13 DIAGNOSIS — N189 Chronic kidney disease, unspecified: Secondary | ICD-10-CM | POA: Insufficient documentation

## 2013-08-13 DIAGNOSIS — Z13820 Encounter for screening for osteoporosis: Secondary | ICD-10-CM

## 2013-08-13 DIAGNOSIS — F329 Major depressive disorder, single episode, unspecified: Secondary | ICD-10-CM

## 2013-08-13 DIAGNOSIS — E119 Type 2 diabetes mellitus without complications: Secondary | ICD-10-CM | POA: Insufficient documentation

## 2013-08-13 DIAGNOSIS — N039 Chronic nephritic syndrome with unspecified morphologic changes: Secondary | ICD-10-CM

## 2013-08-13 DIAGNOSIS — Z1211 Encounter for screening for malignant neoplasm of colon: Secondary | ICD-10-CM

## 2013-08-13 DIAGNOSIS — C8800 Waldenstrom macroglobulinemia not having achieved remission: Secondary | ICD-10-CM | POA: Insufficient documentation

## 2013-08-13 DIAGNOSIS — F3289 Other specified depressive episodes: Secondary | ICD-10-CM | POA: Insufficient documentation

## 2013-08-13 DIAGNOSIS — M7120 Synovial cyst of popliteal space [Baker], unspecified knee: Secondary | ICD-10-CM

## 2013-08-13 DIAGNOSIS — H269 Unspecified cataract: Secondary | ICD-10-CM

## 2013-08-13 DIAGNOSIS — E041 Nontoxic single thyroid nodule: Secondary | ICD-10-CM | POA: Insufficient documentation

## 2013-08-13 DIAGNOSIS — I129 Hypertensive chronic kidney disease with stage 1 through stage 4 chronic kidney disease, or unspecified chronic kidney disease: Secondary | ICD-10-CM | POA: Insufficient documentation

## 2013-08-13 DIAGNOSIS — D472 Monoclonal gammopathy: Secondary | ICD-10-CM | POA: Insufficient documentation

## 2013-08-13 DIAGNOSIS — K219 Gastro-esophageal reflux disease without esophagitis: Secondary | ICD-10-CM | POA: Insufficient documentation

## 2013-08-13 DIAGNOSIS — E785 Hyperlipidemia, unspecified: Secondary | ICD-10-CM

## 2013-08-13 DIAGNOSIS — G8929 Other chronic pain: Secondary | ICD-10-CM | POA: Insufficient documentation

## 2013-08-13 DIAGNOSIS — I10 Essential (primary) hypertension: Secondary | ICD-10-CM

## 2013-08-13 DIAGNOSIS — E039 Hypothyroidism, unspecified: Secondary | ICD-10-CM | POA: Insufficient documentation

## 2013-08-13 DIAGNOSIS — Z124 Encounter for screening for malignant neoplasm of cervix: Secondary | ICD-10-CM | POA: Insufficient documentation

## 2013-08-13 DIAGNOSIS — E611 Iron deficiency: Secondary | ICD-10-CM

## 2013-08-13 DIAGNOSIS — D631 Anemia in chronic kidney disease: Secondary | ICD-10-CM | POA: Insufficient documentation

## 2013-08-13 DIAGNOSIS — B351 Tinea unguium: Secondary | ICD-10-CM | POA: Insufficient documentation

## 2013-08-13 DIAGNOSIS — Z1272 Encounter for screening for malignant neoplasm of vagina: Secondary | ICD-10-CM

## 2013-08-13 DIAGNOSIS — Z0000 Encounter for general adult medical examination without abnormal findings: Secondary | ICD-10-CM

## 2013-08-13 LAB — COMPREHENSIVE METABOLIC PANEL
ALBUMIN: 4 g/dL (ref 3.5–5.2)
ALK PHOS: 81 U/L (ref 39–117)
ALT: 20 U/L (ref 0–35)
AST: 29 U/L (ref 0–37)
BUN: 25 mg/dL — ABNORMAL HIGH (ref 6–23)
CHLORIDE: 102 meq/L (ref 96–112)
CO2: 26 mEq/L (ref 19–32)
Calcium: 10.2 mg/dL (ref 8.4–10.5)
Creatinine, Ser: 1.31 mg/dL — ABNORMAL HIGH (ref 0.50–1.10)
GFR calc Af Amer: 45 mL/min — ABNORMAL LOW (ref >90)
GFR, EST NON AFRICAN AMERICAN: 39 mL/min — AB (ref >90)
Glucose, Bld: 58 mg/dL — ABNORMAL LOW (ref 70–99)
POTASSIUM: 3.6 meq/L — AB (ref 3.7–5.3)
Sodium: 141 mEq/L (ref 137–147)
Total Bilirubin: 0.4 mg/dL (ref 0.3–1.2)
Total Protein: 9.9 g/dL — ABNORMAL HIGH (ref 6.0–8.3)

## 2013-08-13 LAB — CBC WITH DIFFERENTIAL/PLATELET
Basophils Absolute: 0 10*3/uL (ref 0.0–0.1)
Basophils Relative: 0 % (ref 0–1)
EOS ABS: 0.1 10*3/uL (ref 0.0–0.7)
EOS PCT: 3 % (ref 0–5)
HEMATOCRIT: 37 % (ref 36.0–46.0)
HEMOGLOBIN: 12 g/dL (ref 12.0–15.0)
LYMPHS ABS: 1.5 10*3/uL (ref 0.7–4.0)
Lymphocytes Relative: 37 % (ref 12–46)
MCH: 27.3 pg (ref 26.0–34.0)
MCHC: 32.4 g/dL (ref 30.0–36.0)
MCV: 84.1 fL (ref 78.0–100.0)
MONO ABS: 0.3 10*3/uL (ref 0.1–1.0)
MONOS PCT: 7 % (ref 3–12)
NEUTROS PCT: 53 % (ref 43–77)
Neutro Abs: 2.2 10*3/uL (ref 1.7–7.7)
Platelets: 207 10*3/uL (ref 150–400)
RBC: 4.4 MIL/uL (ref 3.87–5.11)
RDW: 13.3 % (ref 11.5–15.5)
WBC: 4.2 10*3/uL (ref 4.0–10.5)

## 2013-08-13 LAB — RETICULOCYTES
RBC.: 4.4 MIL/uL (ref 3.87–5.11)
Retic Count, Absolute: 39.6 10*3/uL (ref 19.0–186.0)
Retic Ct Pct: 0.9 % (ref 0.4–3.1)

## 2013-08-13 LAB — POC HEMOCCULT BLD/STL (OFFICE/1-CARD/DIAGNOSTIC): FECAL OCCULT BLD: NEGATIVE

## 2013-08-13 LAB — LACTATE DEHYDROGENASE: LDH: 245 U/L (ref 94–250)

## 2013-08-13 MED ORDER — TERBINAFINE HCL 250 MG PO TABS: 250.0000 mg | ORAL_TABLET | Freq: Every day | ORAL | Status: DC

## 2013-08-13 NOTE — Patient Instructions (Addendum)
F/U in 3.5 months, call if you need me before  It is important that you exercise regularly at least 30 minutes 5 times a week. If you develop chest pain, have severe difficulty breathing, or feel very tired, stop exercising immediately and seek medical attention   Weight loss goal of 5 pounds A healthy diet is rich in fruit, vegetables and whole grains. Poultry fish, nuts and beans are a healthy choice for protein rather then red meat. A low sodium diet and drinking 64 ounces of water daily is generally recommended. Oils and sweet should be limited. Carbohydrates especially for those who are diabetic or overweight, should be limited to 45 to 60 gram per meal. It is important to eat on a regular schedule, at least 3 times daily. Snacks should be primarily fruits, vegetables or nuts.  Adequate rest, generally 6 to 8 hours per night is important for good health.Good sleep hygiene involves setting a regular bedtime, and turning off all sound and light in your sleep environment.Limiting caffeine intake will also help with the ability to rest well  Lab work needs to be done 3 to 5 days before your follow up visit please.Fasting lipid, cmp and EGFR, hBA1C, cBC, TSH  All medications need to be brought to every visit  Taper off wellbutrin,  Take one every other day fopor 2 weeks, then one twice weekly then stop Stop prilosec and change to decaf drinks, avoid chocolate, no eating 3 hours before lying down. Medication sent in for fungal toenail infection  Diet for Gastroesophageal Reflux Disease, Adult Reflux (acid reflux) is when acid from your stomach flows up into the esophagus. When acid comes in contact with the esophagus, the acid causes irritation and soreness (inflammation) in the esophagus. When reflux happens often or so severely that it causes damage to the esophagus, it is called gastroesophageal reflux disease (GERD). Nutrition therapy can help ease the discomfort of GERD. FOODS OR DRINKS TO  AVOID OR LIMIT  Smoking or chewing tobacco. Nicotine is one of the most potent stimulants to acid production in the gastrointestinal tract.  Caffeinated and decaffeinated coffee and black tea.  Regular or low-calorie carbonated beverages or energy drinks (caffeine-free carbonated beverages are allowed).   Strong spices, such as black pepper, white pepper, red pepper, cayenne, curry powder, and chili powder.  Peppermint or spearmint.  Chocolate.  High-fat foods, including meats and fried foods. Extra added fats including oils, butter, salad dressings, and nuts. Limit these to less than 8 tsp per day.  Fruits and vegetables if they are not tolerated, such as citrus fruits or tomatoes.  Alcohol.  Any food that seems to aggravate your condition. If you have questions regarding your diet, call your caregiver or a registered dietitian. OTHER THINGS THAT MAY HELP GERD INCLUDE:   Eating your meals slowly, in a relaxed setting.  Eating 5 to 6 small meals per day instead of 3 large meals.  Eliminating food for a period of time if it causes distress.  Not lying down until 3 hours after eating a meal.  Keeping the head of your bed raised 6 to 9 inches (15 to 23 cm) by using a foam wedge or blocks under the legs of the bed. Lying flat may make symptoms worse.  Being physically active. Weight loss may be helpful in reducing reflux in overweight or obese adults.  Wear loose fitting clothing EXAMPLE MEAL PLAN This meal plan is approximately 2,000 calories based on [email or web address] meal planning guidelines. Breakfast  cup cooked oatmeal.  1 cup strawberries.  1 cup low-fat milk.  1 oz almonds. Snack  1 cup cucumber slices.  6 oz yogurt (made from low-fat or fat-free milk). Lunch  2 slice whole-wheat bread.  2 oz sliced turkey.  2 tsp mayonnaise.  1 cup blueberries.  1 cup snap peas. Snack  6 whole-wheat crackers.  1 oz string cheese. Dinner   cup brown  rice.  1 cup mixed veggies.  1 tsp olive oil.  3 oz grilled fish. Document Released: 06/14/2005 Document Revised: 09/06/2011 Document Reviewed: 04/30/2011 [employer] Patient Information 2014 [employer].     

## 2013-08-13 NOTE — Progress Notes (Signed)
[place] A. Barnet Glasgow, M.D.  NEW PATIENT EVALUATION   Name: Renee Collins Date: 08/13/2013 MRN: [id number] DOB: 19-Oct-1940  PCP: [name], MD   REFERRING PHYSICIAN: [name], MD  REASON FOR REFERRAL: [facility]     HISTORY OF PRESENT ILLNESS:Renee GIRDIE FISCH is a 73 y.o. female who is referred by her family physician and nephrologist because of abnormal SPEP with a IgG kappa M spike. 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 Appetite is good with normal bowel movements and no fever, night sweats, easy satiety, bone pain, cough, shortness of breath, PND, orthopnea, palpitations, skin rash, lower extremity swelling or redness, headache, or seizures.   PAST MEDICAL HISTORY:  has a past medical history of Hypertension; Depression; Chronic back pain; Anemia; Hypothyroidism; Diabetes mellitus; Hyperlipidemia; and GERD (gastroesophageal reflux disease).     PAST SURGICAL HISTORY: Past Surgical History  Procedure Laterality Date  . Laminectomy  1980's  . Total abdominal hysterectomy  approx 1993    fibroids   . Spine surgery  approx 1983    dr Joya Salm  . Colonoscopy  03/30/2012    Procedure: COLONOSCOPY;  Surgeon: Rogene Houston, MD;  Location: [facility];  Service: Endoscopy;  Laterality: N/A;  730     CURRENT MEDICATIONS: has a current medication list which includes the following prescription(s): amlodipine, aspirin, benazepril, calcium 1200, vitamin d-3, hydrochlorothiazide, levothyroxine, one-a-day extras antioxidant, pravastatin, terbinafine, and acetaminophen.   ALLERGIES: Review of patient's allergies indicates no known allergies.   SOCIAL HISTORY:  reports that she has never smoked. She does not have any smokeless tobacco history on file. She reports that she does not drink alcohol or use illicit drugs.   FAMILY HISTORY: family history includes Cancer in her mother and sister; Diabetes in her  mother and sister; Heart attack in her mother; Heart disease in her brother and mother; Kidney disease in her father; Kidney failure in her father; Myasthenia gravis in her brother.    REVIEW OF SYSTEMS:  Other than that discussed above is noncontributory.    PHYSICAL EXAM:  height is 5\' 6"  (1.676 m) and weight is 182 lb (82.555 kg). Her oral temperature is 97.3 F (36.3 C). Her blood pressure is 138/98 and her pulse is 82. Her respiration is 20.    GENERAL:alert, no distress and comfortable SKIN: skin color, texture, turgor are normal, no rashes or significant lesions EYES: normal, Conjunctiva are pink and non-injected, sclera clear OROPHARYNX:no exudate, no erythema and lips, buccal mucosa, and tongue normal  NECK: supple, thyroid normal size, non-tender, without nodularity CHEST: Normal AP diameter with no breast masses. LYMPH:  no palpable lymphadenopathy in the cervical, axillary or inguinal LUNGS: clear to auscultation and percussion with normal breathing effort HEART: regular rate & rhythm and no murmurs ABDOMEN:abdomen soft, non-tender and normal bowel sounds MUSCULOSKELETALl:no cyanosis of digits, no clubbing or edema  NEURO: alert & oriented x 3 with fluent speech, no focal motor/sensory deficits    LABORATORY DATA:  Office Visit on 08/13/2013  Component Date Value Ref Range Status  . Card #1 Date 08/13/2013 08/13/2013   Final  . Fecal Occult Blood, POC 08/13/2013 Negative   Final   50521 1R 7/15    Urinalysis No results found for this basename: colorurine,  appearanceur,  labspec,  phurine,  glucoseu,  hgbur,  bilirubinur,  ketonesur,  proteinur,  urobilinogen,  nitrite,  leukocytesur      @  RADIOGRAPHY: No results found.  PATHOLOGY: Peripheral smear failed to reveal evidence of rouleaux formation.   IMPRESSION:  #1. 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 #2. Iron deficiency with ferritin of 24. #3. Chronic kidney disease. #4. 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 #5. Thyroid nodule, stable. #6. Hypertension, controlled.   PLAN:  #1. In the presence of her son who accompanied her today, monoclonal gammopathy was discussed as was its significance. #2. Skeletal survey will be ordered along with additional lab tests. #3. Arrangements will be made for iron infusion at the time of her next visit in 2 weeks and if lytic lesions are found on skeletal survey, bone marrow aspiration biopsy will be performed. #4. Followup in 2 weeks.  I appreciate the opportunity of sharing in her care.   Doroteo Bradford, MD 08/13/2013 3:08 PM

## 2013-08-13 NOTE — Patient Instructions (Addendum)
[facility] Discharge Instructions  RECOMMENDATIONS MADE BY THE CONSULTANT AND ANY TEST RESULTS WILL BE SENT TO YOUR REFERRING PHYSICIAN.  EXAM FINDINGS BY THE PHYSICIAN TODAY AND SIGNS OR SYMPTOMS TO REPORT TO CLINIC OR PRIMARY PHYSICIAN:   We are doing a lot of lab work on you today.   We also want you to have a bone survey. You can do this today before you leave the hospital. Go to the Radiology Department.   We need you to collect a 24 hour urine.   Return to see Dr. Barnet Glasgow in 2 weeks   March 3 @ 9:30     Thank you for choosing [facility] to provide your oncology and hematology care.  To afford each patient quality time with our providers, please arrive at least 15 minutes before your scheduled appointment time.  With your help, our goal is to use those 15 minutes to complete the necessary work-up to ensure our physicians have the information they need to help with your evaluation and healthcare recommendations.    Effective January 1st, 2014, we ask that you re-schedule your appointment with our physicians should you arrive 10 or more minutes late for your appointment.  We strive to give you quality time with our providers, and arriving late affects you and other patients whose appointments are after yours.    Again, thank you for choosing [facility].  Our hope is that these requests will decrease the amount of time that you wait before being seen by our physicians.       _____________________________________________________________  Should you have questions after your visit to [facility], please contact our office at [phone] between the hours of 8:30 a.m. and 5:00 p.m.  Voicemails left after 4:30 p.m. will not be returned until the following business day.  For prescription refill requests, have your pharmacy contact our office with your prescription refill request.    24-Hour Urine Collection HOME CARE  When  you get up in the morning on the day you do this test, pee (urinate) in the toilet and flush. Make a note of the time. This will be your start time on the day of collection and the end time on the next morning.  From then on, save all your pee (urine) in the plastic jug that was given to you.  You should stop collecting your pee 24 hours after you started.  If the plastic jug that is given to you already has liquid in it, that is okay. Do not throw out the liquid or rinse out the jug. Some tests need the liquid to be added to your pee.  Keep your plastic jug cool (in an ice chest or the refrigerator) during the test.  When the 24 hours is over, bring your plastic jug to the clinic lab. Keep the jug cool (in an ice chest) while you are bringing it to the lab. Document Released: 09/10/2008 Document Revised: 09/06/2011 Document Reviewed: 09/10/2008 [facility] Patient Information 2014 [place], [place].

## 2013-08-13 NOTE — Assessment & Plan Note (Signed)
Annual exam as documented. Counseling done  re healthy lifestyle involving commitment to 150 minutes exercise per week, heart healthy diet, and attaining healthy weight.The importance of adequate sleep also discussed. Regular seat belt use and safe storage  of firearms if patient has them, is also discussed. Changes in health habits are decided on by the patient with goals and time frames  set for achieving them. Immunization and cancer screening needs are specifically addressed at this visit.  

## 2013-08-13 NOTE — Progress Notes (Signed)
Renee Collins presented for labwork. Labs per MD order drawn via Peripheral Line 23 gauge needle inserted in LT AC  Good blood return present. Procedure without incident.  Needle removed intact. Patient tolerated procedure well.  24h urine collection bottle given to patient. Instructions attached. Patient very familiar with how to do this test.

## 2013-08-14 LAB — IRON AND TIBC
Iron: 101 ug/dL (ref 42–135)
Saturation Ratios: 30 % (ref 20–55)
TIBC: 341 ug/dL (ref 250–470)
UIBC: 240 ug/dL (ref 125–400)

## 2013-08-14 LAB — KAPPA/LAMBDA LIGHT CHAINS
KAPPA FREE LGHT CHN: 13.7 mg/dL — AB (ref 0.33–1.94)
Kappa, lambda light chain ratio: 7.78 — ABNORMAL HIGH (ref 0.26–1.65)
Lambda free light chains: 1.76 mg/dL (ref 0.57–2.63)

## 2013-08-14 LAB — FERRITIN: Ferritin: 32 ng/mL (ref 10–291)

## 2013-08-14 LAB — ERYTHROPOIETIN: Erythropoietin: 8.8 m[IU]/mL (ref 2.6–18.5)

## 2013-08-14 NOTE — Assessment & Plan Note (Addendum)
Onychomycosis affecting all toenails, 3 month of oral med prescribed

## 2013-08-14 NOTE — Assessment & Plan Note (Signed)
Resolved, pt to taper off of medication in next 2 weeks

## 2013-08-14 NOTE — Assessment & Plan Note (Signed)
Has had re evl by opthalmology, no need for intervention at this time

## 2013-08-14 NOTE — Progress Notes (Signed)
This encounter was created in error - please disregard.

## 2013-08-14 NOTE — Progress Notes (Signed)
   Subjective:    Patient ID: Renee Collins, female    DOB: 1940-07-23, 73 y.o.   MRN: [id number]  HPI The patient is for an annual exam. Health maintenance is reviewed and updated, specifically  recommended,  screening tests and immunizations. Recent lab and radiologic data is reviewed also.    Review of Systems See HPI Denies recent fever or chills. Denies sinus pressure, nasal congestion, ear pain or sore throat. Denies chest congestion, productive cough or wheezing. Denies chest pains, palpitations and leg swelling Denies abdominal pain, nausea, vomiting,diarrhea or constipation.   Denies dysuria, frequency, hesitancy or incontinence. Denies joint pain, swelling and limitation in mobility. Denies headaches, seizures, numbness, or tingling. Denies depression, anxiety or insomnia. Denies skin break down or rash.        Objective:   Physical Exam  Pleasant well nourished female, alert and oriented x 3, in no cardio-pulmonary distress. Afebrile. HEENT No facial trauma or asymetry. Sinuses non tender.  EOMI, PERTL, fundoscopic  no hemorhage or exudate.  External ears normal, tympanic membranes clear. Oropharynx moist, no exudate,fairly  good dentition. Neck: supple, no adenopathy,JVD or thyromegaly.No bruits.  Chest: Clear to ascultation bilaterally.No crackles or wheezes. Non tender to palpation  Breast: No asymetry,no masses. No nipple discharge or inversion. No axillary or supraclavicular adenopathy  Cardiovascular system; Heart sounds normal,  S1 and  S2 ,no S3.  No murmur, or thrill. Apical beat not displaced Peripheral pulses normal.  Abdomen: Soft, non tender, no organomegaly or masses. No bruits. Bowel sounds normal. No guarding, tenderness or rebound.  Rectal:  No mass. Guaiac negative stool.  GU: External genitalia normal. No lesions. Vaginal canal normal.Physiologic  discharge. Uterus absent, no adnexal masses, no adnexal  tenderness.  Musculoskeletal exam: Full ROM of spine, hips , shoulders and knees. No deformity ,swelling or crepitus noted. No muscle wasting or atrophy.   Neurologic: Cranial nerves 2 to 12 intact. Power, tone ,sensation and reflexes normal throughout. No disturbance in gait. No tremor.  Skin: Intact, no ulceration, erythema , scaling or rash noted. Pigmentation normal throughout Bilateral toe onychomycosis , severe  Psych; Normal mood and affect. Judgement and concentration normal       Assessment & Plan:  Routine general medical examination at a health care facility Annual exam as documented. Counseling done  re healthy lifestyle involving commitment to 150 minutes exercise per week, heart healthy diet, and attaining healthy weight.The importance of adequate sleep also discussed. Regular seat belt use and safe storage  of firearms if patient has them, is also discussed. Changes in health habits are decided on by the patient with goals and time frames  set for achieving them. Immunization and cancer screening needs are specifically addressed at this visit.   Onychomycosis Onychomycosis affecting all toenails, 3 month of oral med prescribed  Cataracts, bilateral Has had re evl by opthalmology, no need for intervention at this time  DEPRESSION, CHRONIC Resolved, pt to taper off of medication in next 2 weeks

## 2013-08-15 ENCOUNTER — Encounter: Payer: Self-pay | Admitting: Family Medicine

## 2013-08-15 LAB — MULTIPLE MYELOMA PANEL, SERUM
ALBUMIN ELP: 48.1 % — AB (ref 55.8–66.1)
ALPHA-2-GLOBULIN: 11.7 % (ref 7.1–11.8)
Alpha-1-Globulin: 5.8 % — ABNORMAL HIGH (ref 2.9–4.9)
BETA 2: 3 % — AB (ref 3.2–6.5)
BETA GLOBULIN: 5.4 % (ref 4.7–7.2)
Gamma Globulin: 26 % — ABNORMAL HIGH (ref 11.1–18.8)
IGA: 84 mg/dL (ref 69–380)
IGM, SERUM: 68 mg/dL (ref 52–322)
IgG (Immunoglobin G), Serum: 2580 mg/dL — ABNORMAL HIGH (ref 690–1700)
M-Spike, %: 2.15 g/dL
TOTAL PROTEIN: 9.6 g/dL — AB (ref 6.0–8.3)

## 2013-08-17 ENCOUNTER — Encounter: Payer: Self-pay | Admitting: Family Medicine

## 2013-08-17 DIAGNOSIS — E039 Hypothyroidism, unspecified: Secondary | ICD-10-CM

## 2013-08-17 DIAGNOSIS — I10 Essential (primary) hypertension: Secondary | ICD-10-CM

## 2013-08-17 DIAGNOSIS — E785 Hyperlipidemia, unspecified: Secondary | ICD-10-CM

## 2013-08-17 MED ORDER — BENAZEPRIL HCL 20 MG PO TABS: 20.0000 mg | ORAL_TABLET | Freq: Every day | ORAL | Status: DC

## 2013-08-17 MED ORDER — AMLODIPINE BESYLATE 10 MG PO TABS: 10.0000 mg | ORAL_TABLET | Freq: Every day | ORAL | Status: DC

## 2013-08-17 MED ORDER — PRAVASTATIN SODIUM 20 MG PO TABS: 20.0000 mg | ORAL_TABLET | Freq: Every evening | ORAL | Status: DC

## 2013-08-17 MED ORDER — HYDROCHLOROTHIAZIDE 25 MG PO TABS: ORAL_TABLET | ORAL | Status: DC

## 2013-08-17 MED ORDER — LEVOTHYROXINE SODIUM 50 MCG PO TABS: ORAL_TABLET | ORAL | Status: DC

## 2013-08-21 ENCOUNTER — Other Ambulatory Visit (HOSPITAL_COMMUNITY): Payer: Self-pay | Admitting: Oncology

## 2013-08-21 DIAGNOSIS — D472 Monoclonal gammopathy: Secondary | ICD-10-CM

## 2013-08-23 LAB — IMMUNOFIXATION, URINE: Immunofixation, Urine: 0

## 2013-08-28 ENCOUNTER — Encounter (HOSPITAL_COMMUNITY): Payer: Self-pay

## 2013-08-28 ENCOUNTER — Encounter (HOSPITAL_COMMUNITY): Payer: Medicare HMO | Attending: Hematology and Oncology

## 2013-08-28 ENCOUNTER — Encounter: Payer: Self-pay | Admitting: Family Medicine

## 2013-08-28 VITALS — BP 131/78 | HR 76 | Temp 97.7°F | Resp 16 | Wt 185.4 lb

## 2013-08-28 DIAGNOSIS — N189 Chronic kidney disease, unspecified: Secondary | ICD-10-CM

## 2013-08-28 DIAGNOSIS — D472 Monoclonal gammopathy: Secondary | ICD-10-CM

## 2013-08-28 DIAGNOSIS — D649 Anemia, unspecified: Secondary | ICD-10-CM | POA: Insufficient documentation

## 2013-08-28 DIAGNOSIS — E611 Iron deficiency: Secondary | ICD-10-CM

## 2013-08-28 DIAGNOSIS — D509 Iron deficiency anemia, unspecified: Secondary | ICD-10-CM | POA: Insufficient documentation

## 2013-08-28 DIAGNOSIS — I10 Essential (primary) hypertension: Secondary | ICD-10-CM

## 2013-08-28 DIAGNOSIS — E041 Nontoxic single thyroid nodule: Secondary | ICD-10-CM

## 2013-08-28 DIAGNOSIS — E568 Deficiency of other vitamins: Secondary | ICD-10-CM

## 2013-08-28 DIAGNOSIS — D631 Anemia in chronic kidney disease: Secondary | ICD-10-CM

## 2013-08-28 NOTE — Patient Instructions (Signed)
[facility] Discharge Instructions  RECOMMENDATIONS MADE BY THE CONSULTANT AND ANY TEST RESULTS WILL BE SENT TO YOUR REFERRING PHYSICIAN.  EXAM FINDINGS BY THE PHYSICIAN TODAY AND SIGNS OR SYMPTOMS TO REPORT TO CLINIC OR PRIMARY PHYSICIAN: Exam and findings as discussed by Dr. Barnet Glasgow.  Your iron stores are a little low and we will give you an iron infusion on Thursday.  Report bone pain, night sweats, etc.  MEDICATIONS PRESCRIBED:  None   INSTRUCTIONS/FOLLOW-UP: Follow-up in 6 months with blood work and MD visit.  Thank you for choosing [facility] to provide your oncology and hematology care.  To afford each patient quality time with our providers, please arrive at least 15 minutes before your scheduled appointment time.  With your help, our goal is to use those 15 minutes to complete the necessary work-up to ensure our physicians have the information they need to help with your evaluation and healthcare recommendations.    Effective January 1st, 2014, we ask that you re-schedule your appointment with our physicians should you arrive 10 or more minutes late for your appointment.  We strive to give you quality time with our providers, and arriving late affects you and other patients whose appointments are after yours.    Again, thank you for choosing [facility].  Our hope is that these requests will decrease the amount of time that you wait before being seen by our physicians.       _____________________________________________________________  Should you have questions after your visit to [facility], please contact our office at [phone] between the hours of 8:30 a.m. and 5:00 p.m.  Voicemails left after 4:30 p.m. will not be returned until the following business day.  For prescription refill requests, have your pharmacy contact our office with your prescription refill request.

## 2013-08-28 NOTE — Progress Notes (Signed)
[facility]  OFFICE PROGRESS NOTE  [name], MD [address], [address] [place] [address]  DIAGNOSIS: Iron deficiency - Plan: Ferritin  Anemia in chronic kidney disease (CKD)  MGUS (monoclonal gammopathy of unknown significance) - Plan: CBC with Differential, Reticulocytes, Lactate dehydrogenase, Beta 2 microglobuline, serum, Multiple myeloma panel, serum, Kappa/lambda light chains  Chief Complaint  Patient presents with  . Iron deficiency    CURRENT THERAPY: Workup completed for MGUS, also found to be iron deficient.  INTERVAL HISTORY: Renee Collins 73 y.o. female returns for followup of MGUS after additional workup which also revealed iron deficiency.  She was not aware that Feraheme infusion have been ordered for her for today. She offers no new complaints. Exercise tolerance is excellent with no fever, night sweats, easy satiety, lower extremity swelling or redness, PND, orthopnea, palpitations, skin rash, headache, or seizures. She also denies any peripheral paresthesias.  MEDICAL HISTORY: Past Medical History  Diagnosis Date  . Hypertension   . Depression   . Chronic back pain   . Anemia   . Hypothyroidism   . Diabetes mellitus   . Hyperlipidemia   . GERD (gastroesophageal reflux disease)     INTERIM HISTORY: has THYROID NODULE; UNSPECIFIED HYPOTHYROIDISM; DYSLIPIDEMIA; Unspecified anemia; DEPRESSION, CHRONIC; HYPERTENSION; KNEE, ARTHRITIS, DEGEN./OSTEO; BAKER'S CYST, LEFT KNEE; Type 2 diabetes mellitus, controlled; Seasonal allergies; Routine general medical examination at a health care facility; CKD (chronic kidney disease) stage 3, GFR 30-59 ml/min; Cataracts, bilateral; MGUS (monoclonal gammopathy of unknown significance); Anemia in chronic kidney disease (CKD); and Onychomycosis on her problem list.    ALLERGIES:  has No Known Allergies.  MEDICATIONS: has a current medication list which includes the  following prescription(s): acetaminophen, amlodipine, aspirin, benazepril, calcium 1200, vitamin d-3, diphenhydramine, hydrochlorothiazide, levothyroxine, one-a-day extras antioxidant, pravastatin, and terbinafine.  SURGICAL HISTORY:  Past Surgical History  Procedure Laterality Date  . Laminectomy  1980's  . Total abdominal hysterectomy  approx 1993    fibroids   . Spine surgery  approx 1983    dr Joya Salm  . Colonoscopy  03/30/2012    Procedure: COLONOSCOPY;  Surgeon: Rogene Houston, MD;  Location: [facility];  Service: Endoscopy;  Laterality: N/A;  730    FAMILY HISTORY: family history includes Cancer in her mother and sister; Diabetes in her mother and sister; Heart attack in her mother; Heart disease in her brother and mother; Kidney disease in her father; Kidney failure in her father; Myasthenia gravis in her brother.  SOCIAL HISTORY:  reports that she has never smoked. She has never used smokeless tobacco. She reports that she does not drink alcohol or use illicit drugs.  REVIEW OF SYSTEMS:  Other than that discussed above is noncontributory.  PHYSICAL EXAMINATION: ECOG PERFORMANCE STATUS: 0 - Asymptomatic  Blood pressure 131/78, pulse 76, temperature 97.7 F (36.5 C), temperature source Oral, resp. rate 16, weight 185 lb 6.4 oz (84.097 kg).  GENERAL:alert, no distress and comfortable SKIN: skin color, texture, turgor are normal, no rashes or significant lesions EYES: PERLA; Conjunctiva are pink and non-injected, sclera clear OROPHARYNX:no exudate, no erythema on lips, buccal mucosa, or tongue. NECK: supple, thyroid normal size, non-tender, without nodularity. No masses CHEST: Normal AP diameter with no breast masses. LYMPH:  no palpable lymphadenopathy in the cervical, axillary or inguinal LUNGS: clear to auscultation and percussion with normal breathing effort HEART: regular rate & rhythm and no murmurs. ABDOMEN:abdomen soft, non-tender and normal  bowel  sounds MUSCULOSKELETAL:no cyanosis of digits and no clubbing. Range of motion normal.  NEURO: alert & oriented x 3 with fluent speech, no focal motor/sensory deficits   LABORATORY DATA: Orders Only on 08/21/2013  Component Date Value Ref Range Status  . Immunofixation, Urine 08/21/2013 .   Final   Comment: (NOTE)                          Urine IFE shows a monoclonal IgG heavy chain with associated Kappa                          light chain.                          Reviewed by Odis Hollingshead, MD, PhD, FCAP (Electronic Signature on                          File)                          Performed at [facility] Visit on 08/13/2013  Component Date Value Ref Range Status  . LDH 08/13/2013 245  94 - 250 U/L Final  . Sodium 08/13/2013 141  137 - 147 mEq/L Final  . Potassium 08/13/2013 3.6* 3.7 - 5.3 mEq/L Final  . Chloride 08/13/2013 102  96 - 112 mEq/L Final  . CO2 08/13/2013 26  19 - 32 mEq/L Final  . Glucose, Bld 08/13/2013 58* 70 - 99 mg/dL Final  . BUN 08/13/2013 25* 6 - 23 mg/dL Final  . Creatinine, Ser 08/13/2013 1.31* 0.50 - 1.10 mg/dL Final  . Calcium 08/13/2013 10.2  8.4 - 10.5 mg/dL Final  . Total Protein 08/13/2013 9.9* 6.0 - 8.3 g/dL Final  . Albumin 08/13/2013 4.0  3.5 - 5.2 g/dL Final  . AST 08/13/2013 29  0 - 37 U/L Final  . ALT 08/13/2013 20  0 - 35 U/L Final  . Alkaline Phosphatase 08/13/2013 81  39 - 117 U/L Final  . Total Bilirubin 08/13/2013 0.4  0.3 - 1.2 mg/dL Final  . GFR calc non Af Amer 08/13/2013 39* >90 mL/min Final  . GFR calc Af Amer 08/13/2013 45* >90 mL/min Final   Comment: (NOTE)                          The eGFR has been calculated using the CKD EPI equation.                          This calculation has not been validated in all clinical situations.                          eGFR's persistently <90 mL/min signify possible Chronic Kidney                          Disease.  Marland Kitchen Retic Ct Pct 08/13/2013 0.9  0.4 - 3.1 % Final  . RBC.  08/13/2013 4.40  3.87 - 5.11 MIL/uL Final  . Retic Count, Manual 08/13/2013 39.6  19.0 - 186.0 K/uL Final  . WBC 08/13/2013 4.2  4.0 - 10.5 K/uL Final  . RBC 08/13/2013 4.40  3.87 -  5.11 MIL/uL Final  . Hemoglobin 08/13/2013 12.0  12.0 - 15.0 g/dL Final  . HCT 08/13/2013 37.0  36.0 - 46.0 % Final  . MCV 08/13/2013 84.1  78.0 - 100.0 fL Final  . MCH 08/13/2013 27.3  26.0 - 34.0 pg Final  . MCHC 08/13/2013 32.4  30.0 - 36.0 g/dL Final  . RDW 08/13/2013 13.3  11.5 - 15.5 % Final  . Platelets 08/13/2013 207  150 - 400 K/uL Final  . Neutrophils Relative % 08/13/2013 53  43 - 77 % Final  . Neutro Abs 08/13/2013 2.2  1.7 - 7.7 K/uL Final  . Lymphocytes Relative 08/13/2013 37  12 - 46 % Final  . Lymphs Abs 08/13/2013 1.5  0.7 - 4.0 K/uL Final  . Monocytes Relative 08/13/2013 7  3 - 12 % Final  . Monocytes Absolute 08/13/2013 0.3  0.1 - 1.0 K/uL Final  . Eosinophils Relative 08/13/2013 3  0 - 5 % Final  . Eosinophils Absolute 08/13/2013 0.1  0.0 - 0.7 K/uL Final  . Basophils Relative 08/13/2013 0  0 - 1 % Final  . Basophils Absolute 08/13/2013 0.0  0.0 - 0.1 K/uL Final  . Ferritin 08/13/2013 32  10 - 291 ng/mL Final   Performed at [facility]  . Total Protein 08/13/2013 9.6* 6.0 - 8.3 g/dL Final  . Albumin ELP 08/13/2013 48.1* 55.8 - 66.1 % Final  . Alpha-1-Globulin 08/13/2013 5.8* 2.9 - 4.9 % Final  . Alpha-2-Globulin 08/13/2013 11.7  7.1 - 11.8 % Final  . Beta Globulin 08/13/2013 5.4  4.7 - 7.2 % Final  . Beta 2 08/13/2013 3.0* 3.2 - 6.5 % Final  . Gamma Globulin 08/13/2013 26.0* 11.1 - 18.8 % Final  . M-Spike, % 08/13/2013 2.15   Final  . SPE Interp. 08/13/2013 (NOTE)   Final   Comment: A restricted band consistent with monoclonal protein is present.                          The monoclonal protein peak accounts for 2.15 g/dL of the total                          2.50 g/dL of protein in the gamma region.                          Results are consistent with SPE performed on  06/11/2013  Reviewed by                          Odis Hollingshead, MD, PhD, FCAP (Electronic Signature on File)  . Comment 08/13/2013 (NOTE)   Final   Comment: ---------------                          Serum protein electrophoresis is a useful screening procedure in the                          detection of various pathophysiologic states such as inflammation,                          gammopathies, protein loss and other dysproteinemias.  Immunofixation  electrophoresis (IFE) is a more sensitive technique for the                          identification of M-proteins found in patients with monoclonal                          gammopathy of unknown significance (MGUS), amyloidosis, early or                          treated myeloma or macroglobulinemia, solitary plasmacytoma or                          extramedullary plasmacytoma.  . IgG (Immunoglobin G), Serum 08/13/2013 2580* 690 - 1700 mg/dL Final  . IgA 08/13/2013 84  69 - 380 mg/dL Final  . IgM, Serum 08/13/2013 68  52 - 322 mg/dL Final  . Immunofix Electr Int 08/13/2013 (NOTE)   Final   Comment: Monoclonal IgG kappa protein is present.                          Reviewed by Odis Hollingshead, MD, PhD, FCAP (Electronic Signature on                          File)                          Performed at [facility]  . Kappa free light chain 08/13/2013 13.70* 0.33 - 1.94 mg/dL Final  . Lamda free light chains 08/13/2013 1.76  0.57 - 2.63 mg/dL Final  . Kappa, lamda light chain ratio 08/13/2013 7.78* 0.26 - 1.65 Final   Performed at [facility]  . Erythropoietin 08/13/2013 8.8  2.6 - 18.5 mIU/mL Final   Comment: (NOTE)                          Because of diurnal variations in Erythropoietin levels, it is                          important to collect samples at a consistent time of day.  Morning                          samples collected between 7:30 AM and 12:00 noon are recommended.                           Performed at [facility]  . Iron 08/13/2013 101  42 - 135 ug/dL Final  . TIBC 08/13/2013 341  250 - 470 ug/dL Final  . Saturation Ratios 08/13/2013 30  20 - 55 % Final  . UIBC 08/13/2013 240  125 - 400 ug/dL Final   Performed at [facility] Visit on 08/13/2013  Component Date Value Ref Range Status  . Card #1 Date 08/13/2013 08/13/2013   Final  . Fecal Occult Blood, POC 08/13/2013 Negative   Final   [id number] 1R 7/15    PATHOLOGY: Peripheral smear without rouleaux formation.  Urinalysis No results found for this basename: colorurine,  appearanceur,  labspec,  phurine,  glucoseu,  hgbur,  bilirubinur,  ketonesur,  proteinur,  urobilinogen,  nitrite,  leukocytesur    RADIOGRAPHIC STUDIES: Dg Bone Survey Met  08/13/2013   CLINICAL DATA:  Monoclonal gammopathy of unknown significance.  EXAM: METASTATIC BONE SURVEY  COMPARISON:  None.  FINDINGS: There are no lytic or blastic lesions in the visualized portion of the skeleton. There is a thoracolumbar scoliosis and there diffuse degenerative changes throughout the spine. Heart size is normal. There is a large hiatal hernia.  IMPRESSION: No findings suggestive of myeloma or other are acute abnormality of the visualized portion of the skeleton.   Electronically Signed   By: Rozetta Nunnery M.D.   On: 08/13/2013 15:53    ASSESSMENT:  #1. Benign monoclonal gammopathy. #2. Iron deficiency without anemia. #3. Chronic kidney disease. #4. Mixed anemia with contributions from chronic kidney disease as well as iron deficiency. #5. Thyroid nodule, stable. #6. Hypertension, controlled.   PLAN:  #1. Intravenous Feraheme on 08/30/2013. #2. Office visit in 6 months with CBC, chem profile, myeloma workup.   All questions were answered. The patient knows to call the clinic with any problems, questions or concerns. We can certainly see the patient much sooner if necessary.   I spent 25 minutes counseling the patient face to  face. The total time spent in the appointment was 30 minutes.    Doroteo Bradford, MD 08/28/2013 11:20 AM

## 2013-08-29 ENCOUNTER — Other Ambulatory Visit: Payer: Self-pay

## 2013-08-29 DIAGNOSIS — B351 Tinea unguium: Secondary | ICD-10-CM

## 2013-08-29 MED ORDER — TERBINAFINE HCL 250 MG PO TABS: 250.0000 mg | ORAL_TABLET | Freq: Every day | ORAL | Status: DC

## 2013-08-30 ENCOUNTER — Encounter (HOSPITAL_BASED_OUTPATIENT_CLINIC_OR_DEPARTMENT_OTHER): Payer: Medicare HMO

## 2013-08-30 VITALS — BP 129/72 | HR 69 | Temp 98.1°F | Resp 18

## 2013-08-30 DIAGNOSIS — N189 Chronic kidney disease, unspecified: Secondary | ICD-10-CM

## 2013-08-30 DIAGNOSIS — D509 Iron deficiency anemia, unspecified: Secondary | ICD-10-CM

## 2013-08-30 DIAGNOSIS — D649 Anemia, unspecified: Secondary | ICD-10-CM

## 2013-08-30 MED ORDER — SODIUM CHLORIDE 0.9 % IV SOLN
1020.0000 mg | Freq: Once | INTRAVENOUS | Status: AC
  Administered 2013-08-30: 1020 mg via INTRAVENOUS
  Filled 2013-08-30: qty 34

## 2013-08-30 MED ORDER — SODIUM CHLORIDE 0.9 % IV SOLN
Freq: Once | INTRAVENOUS | Status: AC
  Administered 2013-08-30: 10:00:00 via INTRAVENOUS

## 2013-08-30 NOTE — Progress Notes (Signed)
.  Renee Collins arrives today for iron infusion. Tolerated well.

## 2013-09-04 ENCOUNTER — Ambulatory Visit (HOSPITAL_COMMUNITY)
Admission: RE | Admit: 2013-09-04 | Discharge: 2013-09-04 | Disposition: A | Discharge status: Home / Self Care | Payer: Medicare HMO | Source: Ambulatory Visit | Attending: Family Medicine | Admitting: Family Medicine

## 2013-09-04 DIAGNOSIS — M949 Disorder of cartilage, unspecified: Principal | ICD-10-CM

## 2013-09-04 DIAGNOSIS — Z13820 Encounter for screening for osteoporosis: Secondary | ICD-10-CM

## 2013-09-04 DIAGNOSIS — M899 Disorder of bone, unspecified: Secondary | ICD-10-CM | POA: Insufficient documentation

## 2013-09-04 DIAGNOSIS — Z780 Asymptomatic menopausal state: Secondary | ICD-10-CM | POA: Insufficient documentation

## 2013-09-04 DIAGNOSIS — R29890 Loss of height: Secondary | ICD-10-CM | POA: Insufficient documentation

## 2013-09-05 ENCOUNTER — Encounter: Payer: Self-pay | Admitting: Family Medicine

## 2013-09-05 DIAGNOSIS — M8580 Other specified disorders of bone density and structure, unspecified site: Secondary | ICD-10-CM | POA: Insufficient documentation

## 2013-11-12 ENCOUNTER — Encounter: Payer: Self-pay | Admitting: Family Medicine

## 2013-11-12 ENCOUNTER — Other Ambulatory Visit: Payer: Self-pay | Admitting: Family Medicine

## 2013-11-12 DIAGNOSIS — M25562 Pain in left knee: Secondary | ICD-10-CM

## 2013-11-12 LAB — HM DIABETES EYE EXAM

## 2013-11-13 ENCOUNTER — Ambulatory Visit (HOSPITAL_COMMUNITY)
Admission: RE | Admit: 2013-11-13 | Discharge: 2013-11-13 | Disposition: A | Discharge status: Home / Self Care | Payer: Medicare HMO | Source: Ambulatory Visit | Attending: Family Medicine | Admitting: Family Medicine

## 2013-11-13 ENCOUNTER — Ambulatory Visit (INDEPENDENT_AMBULATORY_CARE_PROVIDER_SITE_OTHER): Payer: Medicare HMO

## 2013-11-13 DIAGNOSIS — M1710 Unilateral primary osteoarthritis, unspecified knee: Secondary | ICD-10-CM

## 2013-11-13 DIAGNOSIS — M25562 Pain in left knee: Secondary | ICD-10-CM

## 2013-11-13 DIAGNOSIS — M25569 Pain in unspecified knee: Secondary | ICD-10-CM | POA: Insufficient documentation

## 2013-11-13 DIAGNOSIS — M25469 Effusion, unspecified knee: Secondary | ICD-10-CM | POA: Insufficient documentation

## 2013-11-13 DIAGNOSIS — IMO0002 Reserved for concepts with insufficient information to code with codable children: Secondary | ICD-10-CM

## 2013-11-13 MED ORDER — PREDNISONE 5 MG PO KIT: PACK | ORAL | Status: DC

## 2013-11-14 DIAGNOSIS — M1710 Unilateral primary osteoarthritis, unspecified knee: Secondary | ICD-10-CM

## 2013-11-14 DIAGNOSIS — IMO0002 Reserved for concepts with insufficient information to code with codable children: Secondary | ICD-10-CM

## 2013-11-14 MED ORDER — KETOROLAC TROMETHAMINE 60 MG/2ML IM SOLN
60.0000 mg | Freq: Once | INTRAMUSCULAR | Status: AC
Start: 2013-11-14 — End: 2013-11-14
  Administered 2013-11-14: 60 mg via INTRAMUSCULAR

## 2013-11-14 MED ORDER — METHYLPREDNISOLONE ACETATE 80 MG/ML IJ SUSP
80.0000 mg | Freq: Once | INTRAMUSCULAR | Status: AC
  Administered 2013-11-14: 80 mg via INTRAMUSCULAR

## 2013-11-20 ENCOUNTER — Ambulatory Visit (INDEPENDENT_AMBULATORY_CARE_PROVIDER_SITE_OTHER): Payer: Medicare HMO | Admitting: Orthopedic Surgery

## 2013-11-20 ENCOUNTER — Encounter: Payer: Self-pay | Admitting: Orthopedic Surgery

## 2013-11-20 VITALS — BP 140/78 | Ht 66.0 in | Wt 193.0 lb

## 2013-11-20 DIAGNOSIS — M25462 Effusion, left knee: Secondary | ICD-10-CM | POA: Insufficient documentation

## 2013-11-20 DIAGNOSIS — M25469 Effusion, unspecified knee: Secondary | ICD-10-CM

## 2013-11-20 DIAGNOSIS — M179 Osteoarthritis of knee, unspecified: Secondary | ICD-10-CM

## 2013-11-20 DIAGNOSIS — IMO0002 Reserved for concepts with insufficient information to code with codable children: Secondary | ICD-10-CM

## 2013-11-20 DIAGNOSIS — M1710 Unilateral primary osteoarthritis, unspecified knee: Secondary | ICD-10-CM

## 2013-11-20 MED ORDER — DICLOFENAC POTASSIUM 50 MG PO TABS: 50.0000 mg | ORAL_TABLET | Freq: Two times a day (BID) | ORAL | Status: DC

## 2013-11-20 NOTE — Progress Notes (Signed)
Patient ID: Renee Collins, female   DOB: [birth year], 73 y.o.   MRN: [id number]  Chief Complaint  Patient presents with  . Knee Pain    Left knee pain, no injury.   HISTORY: The patient presents with left knee pain no injury with complaints of left knee locking popping giving out. She has pain and swelling pain as 5/10 constant. Treated with injection and prednisone 5 mg pack with some improvement pain is worse with walking.  Medical history of reflux hypertension thyroid disease  History of laminectomy and total abdominal hysterectomy  Medications recorded in the medical record. No medical allergies  Family history diabetes hypertension kidney disease and cancer  Mother and father and 2 siblings are deceased  Review of systems is negative except for dentures heartburn joint pain muscle weakness swelling of the knee cataracts glasses thyroid disease and disorder with no current symptoms  General appearance is normal, the patient is alert and oriented x3 with normal mood and affect. BP 140/78  Ht 5\' 6"  (1.676 m)  Wt 193 lb (87.544 kg)  BMI 31.17 kg/m2  Gait pattern is normal  Upper extremity exam  The right and left upper extremity:   Inspection and palpation revealed no abnormalities in the upper extremities.   Range of motion is full without contracture.  Motor exam is normal with grade 5 strength.  The joints are fully reduced without subluxation.  There is no atrophy or tremor and muscle tone is normal.  All joints are stable.   Right knee alignment normal, no contracture. Muscle tone normal. Knee is stable. No atrophy.  Left knee joint effusion diffuse tenderness, she retains her range of motion her motor exam is normal her knee is stable without atrophy McMurray sign is negative  X-ray shows medial joint line space narrowing mild to moderate  Impression Encounter Diagnoses  Name Primary?  . OA (osteoarthritis) of knee Yes  . Effusion of knee joint, left      Recommend knee exercises on the following medication  Meds ordered this encounter  Medications  . diclofenac (CATAFLAM) 50 MG tablet    Sig: Take 1 tablet (50 mg total) by mouth 2 (two) times daily.    Dispense:  60 tablet    Refill:  5

## 2013-11-20 NOTE — Patient Instructions (Addendum)
Encounter Diagnoses  Name Primary?  . OA (osteoarthritis) of knee Yes  . Effusion of knee joint, left    we recommend arthritis medications and knee exercises   Please read the following and do the exercises as directed  Osteoarthritis Osteoarthritis is a disease that causes soreness and swelling (inflammation) of a joint. It occurs when the cartilage at the affected joint wears down. Cartilage acts as a cushion, covering the ends of bones where they meet to form a joint. Osteoarthritis is the most common form of arthritis. It often occurs in older people. The joints affected most often by this condition include those in the:  Ends of the fingers.  Thumbs.  Neck.  Lower back.  Knees.  Hips. CAUSES  Over time, the cartilage that covers the ends of bones begins to wear away. This causes bone to rub on bone, producing pain and stiffness in the affected joints.  RISK FACTORS Certain factors can increase your chances of having osteoarthritis, including:  Older age.  Excessive body weight.  Overuse of joints. SIGNS AND SYMPTOMS   Pain, swelling, and stiffness in the joint.  Over time, the joint may lose its normal shape.  Small deposits of bone (osteophytes) may grow on the edges of the joint.  Bits of bone or cartilage can break off and float inside the joint space. This may cause more pain and damage. DIAGNOSIS  Your health care provider will do a physical exam and ask about your symptoms. Various tests may be ordered, such as:  X-rays of the affected joint.  An MRI scan.  Blood tests to rule out other types of arthritis.  Joint fluid tests. This involves using a needle to draw fluid from the joint and examining the fluid under a microscope. TREATMENT  Goals of treatment are to control pain and improve joint function. Treatment plans may include:  A prescribed exercise program that allows for rest and joint relief.  A weight control plan.  Pain relief techniques,  such as:  Properly applied heat and cold.  Electric pulses delivered to nerve endings under the skin (transcutaneous electrical nerve stimulation, TENS).  Massage.  Certain nutritional supplements.  Medicines to control pain, such as:  Acetaminophen.  Nonsteroidal anti-inflammatory drugs (NSAIDs), such as naproxen.  Narcotic or central-acting agents, such as tramadol.  Corticosteroids. These can be given orally or as an injection.  Surgery to reposition the bones and relieve pain (osteotomy) or to remove loose pieces of bone and cartilage. Joint replacement may be needed in advanced states of osteoarthritis. HOME CARE INSTRUCTIONS   Only take over-the-counter or prescription medicines as directed by your health care provider. Take all medicines exactly as instructed.  Maintain a healthy weight. Follow your health care provider's instructions for weight control. This may include dietary instructions.  Exercise as directed. Your health care provider can recommend specific types of exercise. These may include:  Strengthening exercises These are done to strengthen the muscles that support joints affected by arthritis. They can be performed with weights or with exercise bands to add resistance.  Aerobic activities These are exercises, such as brisk walking or low-impact aerobics, that get your heart pumping.  Range-of-motion activities These keep your joints limber.  Balance and agility exercises These help you maintain daily living skills.  Rest your affected joints as directed by your health care provider.  Follow up with your health care provider as directed. SEEK MEDICAL CARE IF:   Your skin turns red.  You develop a  rash in addition to your joint pain.  You have worsening joint pain. SEEK IMMEDIATE MEDICAL CARE IF:  You have a significant loss of weight or appetite.  You have a fever along with joint or muscle aches.  You have night sweats. [place] of Arthritis and Musculoskeletal and Skin Diseases: www.niams.SouthExposed.es [employer] on Aging: http://kim-miller.com/ American College of Rheumatology: www.rheumatology.org Document Released: 06/14/2005 Document Revised: 04/04/2013 Document Reviewed: 02/19/2013 [facility] Patient Information 2014 [place], [place].  Knee Exercises EXERCISES RANGE OF MOTION(ROM) AND STRETCHING EXERCISES These exercises may help you when beginning to rehabilitate your injury. Your symptoms may resolve with or without further involvement from your physician, physical therapist or athletic trainer. While completing these exercises, remember:    Restoring tissue flexibility helps normal motion to return to the joints. This allows healthier, less painful movement and activity.   An effective stretch should be held for at least 30 seconds.   A stretch should never be painful. You should only feel a gentle lengthening or release in the stretched tissue.  STRETCH - Knee Extension, Prone  Lie on your stomach on a firm surface, such as a bed or countertop. Place your right / left knee and leg just beyond the edge of the surface. You may wish to place a towel under the far end of your right / left thigh for comfort.   Relax your leg muscles and allow gravity to straighten your knee. Your clinician may advise you to add an ankle weight if more resistance is helpful for you.  You should feel a stretch in the back of your right / left knee.  RANGE OF MOTION - Knee Flexion, Active  Lie on your back with both knees straight. (If this causes back discomfort, bend your opposite knee, placing your foot flat on the floor.)   Slowly slide your heel back toward your buttocks until you feel a gentle stretch in the front of your knee or thigh.     STRETCH - Quadriceps, Prone   Lie on your stomach on a firm surface, such as a bed or padded floor.   Bend your right / left knee and grasp your ankle. If you are  unable to reach, your ankle or pant leg, use a belt around your foot to lengthen your reach.   Gently pull your heel toward your buttocks. Your knee should not slide out to the side. You should feel a stretch in the front of your thigh and/or knee.   STRETCH  Hamstrings, Supine   Lie on your back. Loop a belt or towel over the ball of your right / left foot.   Straighten your right / left knee and slowly pull on the belt to raise your leg. Do not allow the right / left knee to bend. Keep your opposite leg flat on the floor.  Raise the leg until you feel a gentle stretch behind your right / left knee or thigh.  STRENGTHENING EXERCISES These exercises may help you when beginning to rehabilitate your injury. They may resolve your symptoms with or without further involvement from your physician, physical therapist or athletic trainer. While completing these exercises, remember:    Muscles can gain both the endurance and the strength needed for everyday activities through controlled exercises.   Complete these exercises as instructed by your physician, physical therapist or athletic trainer. Progress the resistance and repetitions only as guided.   You may experience muscle soreness or fatigue, but  the pain or discomfort you are trying to eliminate should never worsen during these exercises. If this pain does worsen, stop and make certain you are following the directions exactly. If the pain is still present after adjustments, discontinue the exercise until you can discuss the trouble with your clinician.  STRENGTH - Quadriceps, Isometrics  Lie on your back with your right / left leg extended and your opposite knee bent.   Gradually tense the muscles in the front of your right / left thigh. You should see either your knee cap slide up toward your hip or increased dimpling just above the knee. This motion will push the back of the knee down toward the floor/mat/bed on which you are lying.    STRENGTH - Quadriceps, Short Arcs   Lie on your back. Place a rolled  towel roll under your knee so that the knee slightly bends.   Raise only your lower leg by tightening the muscles in the front of your thigh. Do not allow your thigh to rise.     STRENGTH - Quadriceps, Straight Leg Raises  Quality counts! Watch for signs that the quadriceps muscle is working to insure you are strengthening the correct muscles and not "cheating" by substituting with healthier muscles.  Lay on your back with your right / left leg extended and your opposite knee bent.   Tense the muscles in the front of your right / left thigh. You should see either your knee cap slide up or increased dimpling just above the knee. Your thigh may even quiver.  Tighten these muscles even more and raise your leg 4 to 6 inches off the floor.   STRENGTH - Hamstring, Curls  Lay on your stomach with your legs extended. (If you lay on a bed, your feet may hang over the edge.)   Tighten the muscles in the back of your thigh to bend your right / left knee up to 90 degrees. Keep your hips flat on the bed/floor.    STRENGTH  Quadriceps, Squats  Stand in a door frame so that your feet and knees are in line with the frame.   Use your hands for balance, not support, on the frame.   Slowly lower your weight, bending at the hips and knees. Keep your lower legs upright so that they are parallel with the door frame. Squat only within the range that does not increase your knee pain. Never let your hips drop below your knees.   Slowly return upright, pushing with your legs, not pulling with your hands.   STRENGTH - Quadriceps, Wall Slides  Follow guidelines for form closely. Increased knee pain often results from poorly placed feet or knees.  Lean against a smooth wall or door and walk your feet out 18-24 inches. Place your feet hip-width apart.   Slowly slide down the wall or door until your knees bend _30________ degrees.* Keep  your knees over your heels, not your toes, and in line with your hips, not falling to either side.   * Your physician, physical therapist or athletic trainer will alter this angle based on your symptoms and progress. Document Released: 04/28/2005 Document Revised: 09/06/2011 Document Reviewed: 09/26/2008 [facility] Patient Information 2013 [place].

## 2013-11-21 NOTE — Progress Notes (Signed)
Patient in to receive injections for left knee pain.  Injections given as ordered.  No sign or symptom of adverse reaction.  No voiced complaints.  Patient has completed xrays and will see Dr. Aline Brochure.

## 2013-12-08 LAB — COMPLETE METABOLIC PANEL WITH GFR
ALK PHOS: 60 U/L (ref 39–117)
ALT: 23 U/L (ref 0–35)
AST: 21 U/L (ref 0–37)
Albumin: 4 g/dL (ref 3.5–5.2)
BILIRUBIN TOTAL: 0.5 mg/dL (ref 0.2–1.2)
BUN: 30 mg/dL — AB (ref 6–23)
CO2: 25 meq/L (ref 19–32)
Calcium: 9.3 mg/dL (ref 8.4–10.5)
Chloride: 106 mEq/L (ref 96–112)
Creat: 1.32 mg/dL — ABNORMAL HIGH (ref 0.50–1.10)
GFR, EST AFRICAN AMERICAN: 46 mL/min — AB
GFR, EST NON AFRICAN AMERICAN: 40 mL/min — AB
GLUCOSE: 85 mg/dL (ref 70–99)
Potassium: 4 mEq/L (ref 3.5–5.3)
Sodium: 139 mEq/L (ref 135–145)
TOTAL PROTEIN: 7.9 g/dL (ref 6.0–8.3)

## 2013-12-08 LAB — LIPID PANEL
Cholesterol: 175 mg/dL (ref 0–200)
HDL: 84 mg/dL (ref >39)
LDL Cholesterol: 81 mg/dL (ref 0–99)
Total CHOL/HDL Ratio: 2.1 Ratio
Triglycerides: 51 mg/dL (ref <150)
VLDL: 10 mg/dL (ref 0–40)

## 2013-12-08 LAB — HEMOGLOBIN A1C
HEMOGLOBIN A1C: 6.5 % — AB (ref <5.7)
Mean Plasma Glucose: 140 mg/dL — ABNORMAL HIGH (ref <117)

## 2013-12-08 LAB — CBC
HEMATOCRIT: 36.6 % (ref 36.0–46.0)
Hemoglobin: 12.1 g/dL (ref 12.0–15.0)
MCH: 28.1 pg (ref 26.0–34.0)
MCHC: 33.1 g/dL (ref 30.0–36.0)
MCV: 85.1 fL (ref 78.0–100.0)
Platelets: 190 10*3/uL (ref 150–400)
RBC: 4.3 MIL/uL (ref 3.87–5.11)
RDW: 14 % (ref 11.5–15.5)
WBC: 4 10*3/uL (ref 4.0–10.5)

## 2013-12-08 LAB — TSH: TSH: 11.326 u[IU]/mL — ABNORMAL HIGH (ref 0.350–4.500)

## 2013-12-09 ENCOUNTER — Other Ambulatory Visit: Payer: Self-pay | Admitting: Family Medicine

## 2013-12-09 DIAGNOSIS — R7989 Other specified abnormal findings of blood chemistry: Secondary | ICD-10-CM

## 2013-12-09 DIAGNOSIS — E041 Nontoxic single thyroid nodule: Secondary | ICD-10-CM

## 2013-12-11 ENCOUNTER — Ambulatory Visit (INDEPENDENT_AMBULATORY_CARE_PROVIDER_SITE_OTHER): Payer: Medicare HMO | Admitting: Family Medicine

## 2013-12-11 ENCOUNTER — Ambulatory Visit (HOSPITAL_COMMUNITY)
Admission: RE | Admit: 2013-12-11 | Discharge: 2013-12-11 | Disposition: A | Discharge status: Home / Self Care | Payer: Medicare HMO | Source: Ambulatory Visit | Attending: Family Medicine | Admitting: Family Medicine

## 2013-12-11 ENCOUNTER — Encounter: Payer: Self-pay | Admitting: Family Medicine

## 2013-12-11 VITALS — BP 130/80 | HR 66 | Resp 18 | Ht 65.0 in | Wt 195.0 lb

## 2013-12-11 DIAGNOSIS — M1712 Unilateral primary osteoarthritis, left knee: Secondary | ICD-10-CM

## 2013-12-11 DIAGNOSIS — E041 Nontoxic single thyroid nodule: Secondary | ICD-10-CM | POA: Insufficient documentation

## 2013-12-11 DIAGNOSIS — F3289 Other specified depressive episodes: Secondary | ICD-10-CM

## 2013-12-11 DIAGNOSIS — E039 Hypothyroidism, unspecified: Secondary | ICD-10-CM

## 2013-12-11 DIAGNOSIS — IMO0002 Reserved for concepts with insufficient information to code with codable children: Secondary | ICD-10-CM

## 2013-12-11 DIAGNOSIS — E785 Hyperlipidemia, unspecified: Secondary | ICD-10-CM

## 2013-12-11 DIAGNOSIS — F329 Major depressive disorder, single episode, unspecified: Secondary | ICD-10-CM

## 2013-12-11 DIAGNOSIS — E119 Type 2 diabetes mellitus without complications: Secondary | ICD-10-CM

## 2013-12-11 DIAGNOSIS — I10 Essential (primary) hypertension: Secondary | ICD-10-CM

## 2013-12-11 DIAGNOSIS — M1710 Unilateral primary osteoarthritis, unspecified knee: Secondary | ICD-10-CM

## 2013-12-11 DIAGNOSIS — R7989 Other specified abnormal findings of blood chemistry: Secondary | ICD-10-CM

## 2013-12-11 NOTE — Patient Instructions (Addendum)
Annual wellness in 2.5 month, call if you need me befoere  Foot exam today is normal, microalb is being sent.  INCREASE synthroid to ONE and a HALF  TABLETS EVERY DAY, repeat TSH on July 31    Resume wellbutrin as before  I believe that you will soon start feeling better

## 2013-12-11 NOTE — Assessment & Plan Note (Addendum)
Deteriorated, resume wellbutrin  As before

## 2013-12-12 LAB — MICROALBUMIN / CREATININE URINE RATIO
Creatinine, Urine: 273.3 mg/dL
Microalb Creat Ratio: 52 mg/g — ABNORMAL HIGH (ref 0.0–30.0)
Microalb, Ur: 14.2 mg/dL — ABNORMAL HIGH (ref 0.00–1.89)

## 2013-12-20 ENCOUNTER — Encounter: Payer: Self-pay | Admitting: Family Medicine

## 2013-12-20 ENCOUNTER — Other Ambulatory Visit: Payer: Self-pay

## 2013-12-20 DIAGNOSIS — E039 Hypothyroidism, unspecified: Secondary | ICD-10-CM

## 2013-12-20 MED ORDER — BUPROPION HCL ER (SMOKING DET) 150 MG PO TB12: 150.0000 mg | ORAL_TABLET | Freq: Two times a day (BID) | ORAL | Status: DC

## 2013-12-20 MED ORDER — LEVOTHYROXINE SODIUM 50 MCG PO TABS: ORAL_TABLET | ORAL | Status: DC

## 2014-01-14 NOTE — Assessment & Plan Note (Signed)
Controlled, no change in medication DASH diet and commitment to daily physical activity for a minimum of 30 minutes discussed and encouraged, as a part of hypertension management. The importance of attaining a healthy weight is also discussed.  

## 2014-01-14 NOTE — Assessment & Plan Note (Signed)
Controlled, no change in management Patient  Re educated about the importance of limiting  Carbohydrate intake , the need to commit to daily physical activity for a minimum of 30 minutes , and to commit weight loss.She continues to do very well with this.

## 2014-01-14 NOTE — Assessment & Plan Note (Signed)
Needs f/u ultrasound, same ordered

## 2014-01-14 NOTE — Assessment & Plan Note (Signed)
Increased pain and debility with instability, refer to ortho for re eval

## 2014-01-14 NOTE — Assessment & Plan Note (Signed)
Controlled, no change in medication Hyperlipidemia:Low fat diet discussed and encouraged.  \ 

## 2014-01-14 NOTE — Progress Notes (Signed)
   Subjective:    Patient ID: Renee Collins, female    DOB: [birth year], 73 y.o.   MRN: [id number]  HPI The PT is here for follow up and re-evaluation of chronic medical conditions, medication management and review of any available recent lab and radiology data.  Preventive health is updated, specifically  Cancer screening and Immunization.   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 The PT denies any adverse reactions to current medications since the last visit.  Has beeen doing poorly in last several weeks, increased depression, had tapered off of wellbutrin, but will need to resume, not suicidal or homicidal Has had increased left knee pain, with locking, no falls    Review of Systems See HPI Denies recent fever or chills. Denies sinus pressure, nasal congestion, ear pain or sore throat. Denies chest congestion, productive cough or wheezing. Denies chest pains, palpitations and leg swelling Denies abdominal pain, nausea, vomiting,diarrhea or constipation.   Denies dysuria, frequency, hesitancy or incontinence. Denies headaches, seizures, numbness, or tingling.  Denies skin break down or rash.        Objective:   Physical Exam BP 130/80  Pulse 66  Resp 18  Ht 5\' 5"  (1.651 m)  Wt 195 lb (88.451 kg)  BMI 32.45 kg/m2  SpO2 98% Patient alert and oriented and in no cardiopulmonary distress.  HEENT: No facial asymmetry, EOMI,   oropharynx pink and moist.  Neck supple no JVD, no mass.  Chest: Clear to auscultation bilaterally.  CVS: S1, S2 no murmurs, no S3.Regular rate.  ABD: Soft non tender.   Ext: No edema  MS: Adequate ROM spine, shoulders, hips and reduced in left  Knee which is swollen and tender in posterior asoect.  Skin: Intact, no ulcerations or rash noted.  Psych: Good eye contact, flat affect. Memory intact mildly anxious and  depressed appearing.  CNS: CN 2-12 intact, power,  normal throughout.no focal deficits  noted.        Assessment & Plan:  DEPRESSION, CHRONIC Deteriorated, resume wellbutrin  As before  HYPERTENSION Controlled, no change in medication DASH diet and commitment to daily physical activity for a minimum of 30 minutes discussed and encouraged, as a part of hypertension management. The importance of attaining a healthy weight is also discussed.   Type 2 diabetes mellitus, controlled Controlled, no change in management Patient  Re educated about the importance of limiting  Carbohydrate intake , the need to commit to daily physical activity for a minimum of 30 minutes , and to commit weight loss.She continues to do very well with this.   DYSLIPIDEMIA Controlled, no change in medication Hyperlipidemia:Low fat diet discussed and encouraged.    UNSPECIFIED HYPOTHYROIDISM Under corrected, change in dose of synthroid and rept test in 8 weeks  KNEE, ARTHRITIS, DEGEN./OSTEO Increased pain and debility with instability, refer to ortho for re eval  THYROID NODULE Needs f/u ultrasound, same ordered

## 2014-01-14 NOTE — Assessment & Plan Note (Signed)
Under corrected, change in dose of synthroid and rept test in 8 weeks

## 2014-01-21 ENCOUNTER — Ambulatory Visit: Payer: Medicare HMO | Admitting: Orthopedic Surgery

## 2014-01-26 LAB — TSH: TSH: 2.634 u[IU]/mL (ref 0.350–4.500)

## 2014-02-21 ENCOUNTER — Other Ambulatory Visit (HOSPITAL_COMMUNITY): Payer: Medicare HMO

## 2014-02-28 ENCOUNTER — Other Ambulatory Visit (HOSPITAL_COMMUNITY): Payer: Medicare HMO

## 2014-02-28 ENCOUNTER — Ambulatory Visit (HOSPITAL_COMMUNITY): Payer: Medicare HMO

## 2014-03-06 ENCOUNTER — Other Ambulatory Visit: Payer: Self-pay | Admitting: Family Medicine

## 2014-03-11 ENCOUNTER — Encounter: Payer: Self-pay | Admitting: Family Medicine

## 2014-03-22 ENCOUNTER — Other Ambulatory Visit (HOSPITAL_COMMUNITY): Payer: Self-pay | Admitting: Hematology and Oncology

## 2014-03-22 ENCOUNTER — Encounter (HOSPITAL_COMMUNITY): Payer: Self-pay | Admitting: Hematology and Oncology

## 2014-04-11 ENCOUNTER — Encounter: Payer: Self-pay | Admitting: Family Medicine

## 2014-04-12 ENCOUNTER — Other Ambulatory Visit: Payer: Self-pay

## 2014-04-12 MED ORDER — HYDROCHLOROTHIAZIDE 25 MG PO TABS: ORAL_TABLET | ORAL | Status: DC

## 2014-04-26 ENCOUNTER — Other Ambulatory Visit: Payer: Self-pay | Admitting: Family Medicine

## 2014-04-26 DIAGNOSIS — Z1231 Encounter for screening mammogram for malignant neoplasm of breast: Secondary | ICD-10-CM

## 2014-05-02 ENCOUNTER — Ambulatory Visit (INDEPENDENT_AMBULATORY_CARE_PROVIDER_SITE_OTHER): Payer: Medicare HMO

## 2014-05-02 ENCOUNTER — Ambulatory Visit (HOSPITAL_COMMUNITY)
Admission: RE | Admit: 2014-05-02 | Discharge: 2014-05-02 | Disposition: A | Discharge status: Home / Self Care | Payer: Medicare HMO | Source: Ambulatory Visit | Attending: Family Medicine | Admitting: Family Medicine

## 2014-05-02 ENCOUNTER — Encounter: Payer: Self-pay | Admitting: Family Medicine

## 2014-05-02 ENCOUNTER — Ambulatory Visit (INDEPENDENT_AMBULATORY_CARE_PROVIDER_SITE_OTHER): Payer: Medicare HMO | Admitting: Family Medicine

## 2014-05-02 VITALS — BP 130/80 | HR 64 | Resp 16 | Ht 65.0 in | Wt 204.4 lb

## 2014-05-02 DIAGNOSIS — Z1231 Encounter for screening mammogram for malignant neoplasm of breast: Secondary | ICD-10-CM | POA: Diagnosis not present

## 2014-05-02 DIAGNOSIS — E785 Hyperlipidemia, unspecified: Secondary | ICD-10-CM

## 2014-05-02 DIAGNOSIS — I10 Essential (primary) hypertension: Secondary | ICD-10-CM

## 2014-05-02 DIAGNOSIS — E041 Nontoxic single thyroid nodule: Secondary | ICD-10-CM

## 2014-05-02 DIAGNOSIS — N183 Chronic kidney disease, stage 3 unspecified: Secondary | ICD-10-CM

## 2014-05-02 DIAGNOSIS — Z23 Encounter for immunization: Secondary | ICD-10-CM | POA: Insufficient documentation

## 2014-05-02 DIAGNOSIS — E119 Type 2 diabetes mellitus without complications: Secondary | ICD-10-CM

## 2014-05-02 DIAGNOSIS — E038 Other specified hypothyroidism: Secondary | ICD-10-CM

## 2014-05-02 DIAGNOSIS — Z0000 Encounter for general adult medical examination without abnormal findings: Secondary | ICD-10-CM

## 2014-05-02 NOTE — Assessment & Plan Note (Signed)
Vaccine administered at visit.  

## 2014-05-02 NOTE — Patient Instructions (Signed)
F/u in 4 month, call if you need me before  PLEASE stop  Diclofenac, due to chronic kidney disease  Flu vaccine today  You need to reschedule with oncology  CMP and eGFr, hBA1C , TSH today, fungal med will be sent in if liver is good  Fasting lipid , cmp and eGFR, HBA1c and TSH in 4 month  It is important that you exercise regularly at least 30 minutes 5 times a week. If you develop chest pain, have severe difficulty breathing, or feel very tired, stop exercising immediately and seek medical attention   A healthy diet is rich in fruit, vegetables and whole grains. Poultry fish, nuts and beans are a healthy choice for protein rather then red meat. A low sodium diet and drinking 64 ounces of water daily is generally recommended. Oils and sweet should be limited. Carbohydrates especially for those who are diabetic or overweight, should be limited to 34-45 gram per meal. It is important to eat on a regular schedule, at least 3 times daily. Snacks should be primarily fruits, vegetables or nuts. Weight loss goal of 6 pounds  Fall Prevention and Home Safety Falls cause injuries and can affect all age groups. It is possible to prevent falls.  HOW TO PREVENT FALLS  Wear shoes with rubber soles that do not have an opening for your toes.  Keep the inside and outside of your house well lit.  Use night lights throughout your home.  Remove clutter from floors.  Clean up floor spills.  Remove throw rugs or fasten them to the floor with carpet tape.  Do not place electrical cords across pathways.  Put grab bars by your tub, shower, and toilet. Do not use towel bars as grab bars.  Put handrails on both sides of the stairway. Fix loose handrails.  Do not climb on stools or stepladders, if possible.  Do not wax your floors.  Repair uneven or unsafe sidewalks, walkways, or stairs.  Keep items you use a lot within reach.  Be aware of pets.  Keep emergency numbers next to the  telephone.  Put smoke detectors in your home and near bedrooms. Ask your doctor what other things you can do to prevent falls. Document Released: 04/10/2009 Document Revised: 12/14/2011 Document Reviewed: 09/14/2011 [facility] Patient Information 2015 [place], [place]. This information is not intended to replace advice given to you by your health care provider. Make sure you discuss any questions you have with your health care provider.

## 2014-05-02 NOTE — Progress Notes (Signed)
Preventive Screening-Counseling & Management   Patient present here today for a Medicare annual wellness visit. Flu vaccine which is due is also administered   Current Problems (verified)   Medications Prior to Visit Allergies (verified)   PAST HISTORY  Family History (verified)   Social History Divorced with 2 children, retired. Son lives with her    Risk Factors  Current exercise habits:  Tries to walk a couple days a week   Dietary issues discussed: Heart healthy diet discussed- fruits and vegetables, encouraged to eat more baked foods and less fried foods    Cardiac risk factors: Mother had heart attack at age 32 , pt is diabetic  Depression Screen  (Note: if answer to either of the following is "Yes", a more complete depression screening is indicated)   Over the past two weeks, have you felt down, depressed or hopeless? No  Over the past two weeks, have you felt little interest or pleasure in doing things? No  Have you lost interest or pleasure in daily life? No  Do you often feel hopeless? No  Do you cry easily over simple problems? No   Activities of Daily Living  In your present state of health, do you have any difficulty performing the following activities?  Driving?: No Managing money?: No Feeding yourself?:No Getting from bed to chair?:No Climbing a flight of stairs?:No Preparing food and eating?:No Bathing or showering?:No Getting dressed?:No Getting to the toilet?:No Using the toilet?:No Moving around from place to place?: No  Fall Risk Assessment In the past year have you fallen or had a near fall?:No Are you currently taking any medications that make you dizzy?:No   Hearing Difficulties: No Do you often ask people to speak up or repeat themselves?:No Do you experience ringing or noises in your ears?:No Do you have difficulty understanding soft or whispered voices?:No  Cognitive Testing  Alert? Yes Normal Appearance?Yes  Oriented to person?  Yes Place? Yes  Time? Yes  Displays appropriate judgment?Yes  Can read the correct time from a watch face? yes Are you having problems remembering things?No  Advanced Directives have been discussed with the patient?Yes, patient has a living will    List the Names of Other Physician/Practitioners you currently use:  Dr Aline Brochure (ortho)   Indicate any recent Medical Services you may have received from other than [facility] providers in the past year (date may be approximate).   Assessment:    Annual Wellness Exam   Plan:      Patient Instructions (the written plan) was given to the patient.  Medicare Attestation  I have personally reviewed:  The patient's medical and social history  Their use of alcohol, tobacco or illicit drugs  Their current medications and supplements  The patient's functional ability including ADLs,fall risks, home safety risks, cognitive, and hearing and visual impairment  Diet and physical activities  Evidence for depression or mood disorders  The patient's weight, height, BMI, and visual acuity have been recorded in the chart. I have made referrals, counseling, and provided education to the patient based on review of the above and I have provided the patient with a written personalized care plan for preventive services.

## 2014-05-02 NOTE — Assessment & Plan Note (Signed)

## 2014-05-03 LAB — COMPLETE METABOLIC PANEL WITH GFR
ALT: 25 U/L (ref 0–35)
AST: 24 U/L (ref 0–37)
Albumin: 4.3 g/dL (ref 3.5–5.2)
Alkaline Phosphatase: 66 U/L (ref 39–117)
BILIRUBIN TOTAL: 0.5 mg/dL (ref 0.2–1.2)
BUN: 26 mg/dL — ABNORMAL HIGH (ref 6–23)
CO2: 24 mEq/L (ref 19–32)
Calcium: 9.5 mg/dL (ref 8.4–10.5)
Chloride: 103 mEq/L (ref 96–112)
Creat: 1.32 mg/dL — ABNORMAL HIGH (ref 0.50–1.10)
GFR, Est African American: 46 mL/min — ABNORMAL LOW
GFR, Est Non African American: 40 mL/min — ABNORMAL LOW
Glucose, Bld: 94 mg/dL (ref 70–99)
Potassium: 3.9 mEq/L (ref 3.5–5.3)
Sodium: 137 mEq/L (ref 135–145)
Total Protein: 8.2 g/dL (ref 6.0–8.3)

## 2014-05-03 LAB — HEMOGLOBIN A1C
HEMOGLOBIN A1C: 6.5 % — AB (ref <5.7)
MEAN PLASMA GLUCOSE: 140 mg/dL — AB (ref <117)

## 2014-05-03 LAB — TSH: TSH: 1.977 u[IU]/mL (ref 0.350–4.500)

## 2014-07-01 ENCOUNTER — Telehealth: Payer: Self-pay | Admitting: Family Medicine

## 2014-07-01 DIAGNOSIS — E785 Hyperlipidemia, unspecified: Secondary | ICD-10-CM

## 2014-07-01 DIAGNOSIS — I10 Essential (primary) hypertension: Secondary | ICD-10-CM

## 2014-07-01 MED ORDER — AMLODIPINE BESYLATE 10 MG PO TABS: 10.0000 mg | ORAL_TABLET | Freq: Every day | ORAL | Status: DC

## 2014-07-01 MED ORDER — BENAZEPRIL HCL 20 MG PO TABS: 20.0000 mg | ORAL_TABLET | Freq: Every day | ORAL | Status: DC

## 2014-07-01 MED ORDER — HYDROCHLOROTHIAZIDE 25 MG PO TABS: ORAL_TABLET | ORAL | Status: DC

## 2014-07-01 MED ORDER — PRAVASTATIN SODIUM 20 MG PO TABS: 20.0000 mg | ORAL_TABLET | Freq: Every evening | ORAL | Status: DC

## 2014-07-01 MED ORDER — LEVOTHYROXINE SODIUM 50 MCG PO TABS: ORAL_TABLET | ORAL | Status: DC

## 2014-07-01 NOTE — Telephone Encounter (Signed)
Med sent to [facility]

## 2014-07-04 ENCOUNTER — Other Ambulatory Visit: Payer: Self-pay

## 2014-07-04 MED ORDER — HYDROCHLOROTHIAZIDE 25 MG PO TABS: ORAL_TABLET | ORAL | Status: DC

## 2014-07-04 MED ORDER — AMLODIPINE BESYLATE 10 MG PO TABS: 10.0000 mg | ORAL_TABLET | Freq: Every day | ORAL | Status: DC

## 2014-07-04 MED ORDER — BENAZEPRIL HCL 20 MG PO TABS: 20.0000 mg | ORAL_TABLET | Freq: Every day | ORAL | Status: DC

## 2014-07-18 ENCOUNTER — Other Ambulatory Visit: Payer: Self-pay

## 2014-07-18 DIAGNOSIS — I10 Essential (primary) hypertension: Secondary | ICD-10-CM

## 2014-07-18 DIAGNOSIS — E785 Hyperlipidemia, unspecified: Secondary | ICD-10-CM

## 2014-07-18 MED ORDER — PRAVASTATIN SODIUM 20 MG PO TABS: 20.0000 mg | ORAL_TABLET | Freq: Every evening | ORAL | Status: DC

## 2014-07-18 MED ORDER — LEVOTHYROXINE SODIUM 50 MCG PO TABS: ORAL_TABLET | ORAL | Status: DC

## 2014-07-18 MED ORDER — HYDROCHLOROTHIAZIDE 25 MG PO TABS: ORAL_TABLET | ORAL | Status: DC

## 2014-07-18 MED ORDER — AMLODIPINE BESYLATE 10 MG PO TABS
10.0000 mg | ORAL_TABLET | Freq: Every day | ORAL | Status: DC
Start: 2014-07-18 — End: 2015-03-28

## 2014-07-18 MED ORDER — BENAZEPRIL HCL 20 MG PO TABS: 20.0000 mg | ORAL_TABLET | Freq: Every day | ORAL | Status: DC

## 2014-09-03 ENCOUNTER — Ambulatory Visit: Payer: Medicare HMO | Admitting: Family Medicine

## 2014-09-19 ENCOUNTER — Encounter: Payer: Self-pay | Admitting: Family Medicine

## 2014-09-19 ENCOUNTER — Ambulatory Visit (INDEPENDENT_AMBULATORY_CARE_PROVIDER_SITE_OTHER): Payer: Commercial Managed Care - HMO | Admitting: Family Medicine

## 2014-09-19 VITALS — BP 120/80 | HR 98 | Resp 16 | Ht 65.0 in | Wt 197.0 lb

## 2014-09-19 DIAGNOSIS — E119 Type 2 diabetes mellitus without complications: Secondary | ICD-10-CM

## 2014-09-19 DIAGNOSIS — Z23 Encounter for immunization: Secondary | ICD-10-CM

## 2014-09-19 DIAGNOSIS — I10 Essential (primary) hypertension: Secondary | ICD-10-CM | POA: Diagnosis not present

## 2014-09-19 DIAGNOSIS — E785 Hyperlipidemia, unspecified: Secondary | ICD-10-CM

## 2014-09-19 DIAGNOSIS — F329 Major depressive disorder, single episode, unspecified: Secondary | ICD-10-CM

## 2014-09-19 DIAGNOSIS — E038 Other specified hypothyroidism: Secondary | ICD-10-CM

## 2014-09-19 DIAGNOSIS — F32A Depression, unspecified: Secondary | ICD-10-CM

## 2014-09-19 LAB — COMPLETE METABOLIC PANEL WITH GFR
ALT: 24 U/L (ref 0–35)
AST: 32 U/L (ref 0–37)
Albumin: 4 g/dL (ref 3.5–5.2)
Alkaline Phosphatase: 59 U/L (ref 39–117)
BILIRUBIN TOTAL: 0.3 mg/dL (ref 0.2–1.2)
BUN: 22 mg/dL (ref 6–23)
CALCIUM: 9.2 mg/dL (ref 8.4–10.5)
CHLORIDE: 105 meq/L (ref 96–112)
CO2: 24 mEq/L (ref 19–32)
CREATININE: 1.44 mg/dL — AB (ref 0.50–1.10)
GFR, Est African American: 41 mL/min — ABNORMAL LOW
GFR, Est Non African American: 36 mL/min — ABNORMAL LOW
Glucose, Bld: 96 mg/dL (ref 70–99)
Potassium: 4 mEq/L (ref 3.5–5.3)
Sodium: 138 mEq/L (ref 135–145)
Total Protein: 8.6 g/dL — ABNORMAL HIGH (ref 6.0–8.3)

## 2014-09-19 LAB — LIPID PANEL
CHOLESTEROL: 147 mg/dL (ref 0–200)
HDL: 44 mg/dL — ABNORMAL LOW (ref >=46)
LDL Cholesterol: 86 mg/dL (ref 0–99)
Total CHOL/HDL Ratio: 3.3 Ratio
Triglycerides: 85 mg/dL (ref <150)
VLDL: 17 mg/dL (ref 0–40)

## 2014-09-19 MED ORDER — FLUOXETINE HCL 10 MG PO TABS: 10.0000 mg | ORAL_TABLET | Freq: Every day | ORAL | Status: DC

## 2014-09-19 NOTE — Progress Notes (Signed)
Subjective:    Patient ID: Renee Collins, female    DOB: [birth year], 74 y.o.   MRN: [id number]  HPI   Renee Collins     MRN: [id number]      DOB: [birth year]   HPI [name] is here for follow up and re-evaluation of chronic medical conditions, medication management and review of any available recent lab and radiology data.  Preventive health is updated, specifically  Cancer screening and Immunization.   Questions or concerns regarding consultations or procedures which the PT has had in the interim are  addressed. The PT denies any adverse reactions to current medications since the last visit.  There are no new concerns.  There are no specific complaints   ROS Denies recent fever or chills. Denies sinus pressure, nasal congestion, ear pain or sore throat. Denies chest congestion, productive cough or wheezing. Denies chest pains, palpitations and leg swelling Denies abdominal pain, nausea, vomiting,diarrhea or constipation.   Denies dysuria, frequency, hesitancy or incontinence. Denies joint pain, swelling and limitation in mobility. Denies headaches, seizures, numbness, or tingling. Denies depression, anxiety or insomnia. Denies skin break down or rash.   PE  BP 120/80 mmHg  Pulse 98  Resp 16  Ht 5\' 5"  (1.651 m)  Wt 197 lb (89.359 kg)  BMI 32.78 kg/m2  SpO2 96%  Patient alert and oriented and in no cardiopulmonary distress.  HEENT: No facial asymmetry, EOMI,   oropharynx pink and moist.  Neck supple no JVD, no mass.  Chest: Clear to auscultation bilaterally.  CVS: S1, S2 no murmurs, no S3.Regular rate.  ABD: Soft non tender.   Ext: No edema  MS: Adequate ROM spine, shoulders, hips and knees.  Skin: Intact, no ulcerations or rash noted.  Psych: Good eye contact, normal affect. Memory intact not anxious or depressed appearing.  CNS: CN 2-12 intact, power,  normal throughout.no focal deficits noted.   Assessment & Plan   Essential hypertension Controlled,  no change in medication DASH diet and commitment to daily physical activity for a minimum of 30 minutes discussed and encouraged, as a part of hypertension management. The importance of attaining a healthy weight is also discussed.  BP/Weight 09/24/2014 09/19/2014 05/02/2014 12/11/2013 11/20/2013 [id number] [id number]  Systolic BP A999333 [id number] [id number] A999333  Diastolic BP 76 80 80 80 78 72 78  Wt. (Lbs) 196.9 197 204.4 195 193 - 185.4  BMI 32.77 32.78 34.01 32.45 31.17 - 29.94    CMP Latest Ref Rng 09/24/2014 09/19/2014 05/02/2014  Glucose 70 - 99 mg/dL 89 96 94  BUN 6 - 23 mg/dL 28(H) 22 26(H)  Creatinine 0.50 - 1.10 mg/dL 1.47(H) 1.44(H) 1.32(H)  Sodium 135 - 145 mmol/L 135 138 137  Potassium 3.5 - 5.1 mmol/L 3.8 4.0 3.9  Chloride 96 - 112 mmol/L 108 105 103  CO2 19 - 32 mmol/L 20 24 24   Calcium 8.4 - 10.5 mg/dL 9.1 9.2 9.5  Total Protein 6.0 - 8.3 g/dL 9.1(H) 8.6(H) 8.2  Total Bilirubin 0.3 - 1.2 mg/dL 0.4 0.3 0.5  Alkaline Phos 39 - 117 U/L 58 59 66  AST 0 - 37 U/L 29 32 24  ALT 0 - 35 U/L 27 24 25         Type 2 diabetes mellitus, controlled deteriorated Patient educated about the importance of limiting  Carbohydrate intake , the need to commit to daily physical activity for a minimum of 30 minutes , and to commit weight loss. The fact that  changes in all these areas will reduce or eliminate all together the development of diabetes is stressed.   Diabetic Labs Latest Ref Rng 09/24/2014 09/19/2014 05/02/2014 12/11/2013 12/07/2013  HbA1c <5.7 % - 6.9(H) 6.5(H) - 6.5(H)  Microalbumin 0.00 - 1.89 mg/dL - - - 14.20(H) -  Micro/Creat Ratio 0.0 - 30.0 mg/g - - - 52.0(H) -  Chol 0 - 200 mg/dL - 147 - - 175  HDL >=46 mg/dL - 44(L) - - 84  Calc LDL 0 - 99 mg/dL - 86 - - 81  Triglycerides <150 mg/dL - 85 - - 51  Creatinine 0.50 - 1.10 mg/dL 1.47(H) 1.44(H) 1.32(H) - 1.32(H)   BP/Weight 09/24/2014 09/19/2014 05/02/2014 12/11/2013 11/20/2013 123456 0000000  Systolic BP A999333 123456 [id number] [id number] 140 Q000111Q A999333    Diastolic BP 76 80 80 80 78 72 78  Wt. (Lbs) 196.9 197 204.4 195 193 - 185.4  BMI 32.77 32.78 34.01 32.45 31.17 - 29.94   Foot/eye exam completion dates Latest Ref Rng 12/11/2013 11/12/2013  Eye Exam No Retinopathy - No Retinopathy  Foot Form Completion - Done -        Dyslipidemia Controlled, no change in medication Hyperlipidemia:Low fat diet discussed and encouraged.  CMP Latest Ref Rng 09/24/2014 09/19/2014 05/02/2014  Glucose 70 - 99 mg/dL 89 96 94  BUN 6 - 23 mg/dL 28(H) 22 26(H)  Creatinine 0.50 - 1.10 mg/dL 1.47(H) 1.44(H) 1.32(H)  Sodium 135 - 145 mmol/L 135 138 137  Potassium 3.5 - 5.1 mmol/L 3.8 4.0 3.9  Chloride 96 - 112 mmol/L 108 105 103  CO2 19 - 32 mmol/L 20 24 24   Calcium 8.4 - 10.5 mg/dL 9.1 9.2 9.5  Total Protein 6.0 - 8.3 g/dL 9.1(H) 8.6(H) 8.2  Total Bilirubin 0.3 - 1.2 mg/dL 0.4 0.3 0.5  Alkaline Phos 39 - 117 U/L 58 59 66  AST 0 - 37 U/L 29 32 24  ALT 0 - 35 U/L 27 24 25     Lipid Panel     Component Value Date/Time   CHOL 147 09/19/2014 1053   TRIG 85 09/19/2014 1053   HDL 44* 09/19/2014 1053   CHOLHDL 3.3 09/19/2014 1053   VLDL 17 09/19/2014 1053   LDLCALC 86 09/19/2014 1053         Depression Has been out of medication due to formulary change with increase in cost Start fluoxetine, for GAD and mild depression Not suicidal or homicidal   Hypothyroidism Controlled, no change in medication         Review of Systems     Objective:   Physical Exam        Assessment & Plan:

## 2014-09-19 NOTE — Patient Instructions (Addendum)
Annual physical  in 4 month, call if you need me before  Prevnar today  You are referred for eye exam end May as discussed  It is important that you exercise regularly at least 30 minutes 5 times a week. If you develop chest pain, have severe difficulty breathing, or feel very tired, stop exercising immediately and seek medical attention    A healthy diet is rich in fruit, vegetables and whole grains. Poultry fish, nuts and beans are a healthy choice for protein rather then red meat. A low sodium diet and drinking 64 ounces of water daily is generally recommended. Oils and sweet should be limited. Carbohydrates especially for those who are diabetic or overweight, should be limited to 60-45 gram per meal. It is important to eat on a regular schedule, at least 3 times daily. Snacks should be primarily fruits, vegetables or nuts.   Labs today, lipid, cmp and EGFr, hBA1C , TSH   Labs in 4 months, micralb, HBA1C, cBC, TSH, chem 7 and EGFR  New for depression is fluoxetine, call if a problem

## 2014-09-20 LAB — HEMOGLOBIN A1C
HEMOGLOBIN A1C: 6.9 % — AB (ref <5.7)
Mean Plasma Glucose: 151 mg/dL — ABNORMAL HIGH (ref <117)

## 2014-09-20 LAB — TSH: TSH: 1.519 u[IU]/mL (ref 0.350–4.500)

## 2014-09-23 ENCOUNTER — Encounter: Payer: Self-pay | Admitting: Family Medicine

## 2014-09-24 ENCOUNTER — Encounter (HOSPITAL_COMMUNITY): Payer: Self-pay | Admitting: Hematology & Oncology

## 2014-09-24 ENCOUNTER — Encounter (HOSPITAL_BASED_OUTPATIENT_CLINIC_OR_DEPARTMENT_OTHER): Payer: Commercial Managed Care - HMO

## 2014-09-24 ENCOUNTER — Encounter (HOSPITAL_COMMUNITY): Payer: Commercial Managed Care - HMO | Attending: Hematology & Oncology | Admitting: Hematology & Oncology

## 2014-09-24 VITALS — BP 124/76 | HR 84 | Temp 97.8°F | Resp 16 | Wt 196.9 lb

## 2014-09-24 DIAGNOSIS — D500 Iron deficiency anemia secondary to blood loss (chronic): Secondary | ICD-10-CM

## 2014-09-24 DIAGNOSIS — N183 Chronic kidney disease, stage 3 (moderate): Secondary | ICD-10-CM | POA: Insufficient documentation

## 2014-09-24 DIAGNOSIS — E611 Iron deficiency: Secondary | ICD-10-CM

## 2014-09-24 DIAGNOSIS — D472 Monoclonal gammopathy: Secondary | ICD-10-CM

## 2014-09-24 DIAGNOSIS — D649 Anemia, unspecified: Secondary | ICD-10-CM | POA: Diagnosis not present

## 2014-09-24 LAB — COMPREHENSIVE METABOLIC PANEL
ALT: 27 U/L (ref 0–35)
AST: 29 U/L (ref 0–37)
Albumin: 3.8 g/dL (ref 3.5–5.2)
Alkaline Phosphatase: 58 U/L (ref 39–117)
Anion gap: 7 (ref 5–15)
BUN: 28 mg/dL — ABNORMAL HIGH (ref 6–23)
CHLORIDE: 108 mmol/L (ref 96–112)
CO2: 20 mmol/L (ref 19–32)
Calcium: 9.1 mg/dL (ref 8.4–10.5)
Creatinine, Ser: 1.47 mg/dL — ABNORMAL HIGH (ref 0.50–1.10)
GFR calc Af Amer: 39 mL/min — ABNORMAL LOW (ref >90)
GFR, EST NON AFRICAN AMERICAN: 34 mL/min — AB (ref >90)
GLUCOSE: 89 mg/dL (ref 70–99)
POTASSIUM: 3.8 mmol/L (ref 3.5–5.1)
Sodium: 135 mmol/L (ref 135–145)
Total Bilirubin: 0.4 mg/dL (ref 0.3–1.2)
Total Protein: 9.1 g/dL — ABNORMAL HIGH (ref 6.0–8.3)

## 2014-09-24 LAB — CBC WITH DIFFERENTIAL/PLATELET
BASOS ABS: 0 10*3/uL (ref 0.0–0.1)
Basophils Relative: 1 % (ref 0–1)
EOS ABS: 0.3 10*3/uL (ref 0.0–0.7)
Eosinophils Relative: 6 % — ABNORMAL HIGH (ref 0–5)
HEMATOCRIT: 33.8 % — AB (ref 36.0–46.0)
Hemoglobin: 11.1 g/dL — ABNORMAL LOW (ref 12.0–15.0)
Lymphocytes Relative: 34 % (ref 12–46)
Lymphs Abs: 1.9 10*3/uL (ref 0.7–4.0)
MCH: 27.1 pg (ref 26.0–34.0)
MCHC: 32.8 g/dL (ref 30.0–36.0)
MCV: 82.6 fL (ref 78.0–100.0)
Monocytes Absolute: 0.4 10*3/uL (ref 0.1–1.0)
Monocytes Relative: 7 % (ref 3–12)
Neutro Abs: 2.9 10*3/uL (ref 1.7–7.7)
Neutrophils Relative %: 53 % (ref 43–77)
Platelets: 262 10*3/uL (ref 150–400)
RBC: 4.09 MIL/uL (ref 3.87–5.11)
RDW: 13.6 % (ref 11.5–15.5)
WBC: 5.5 10*3/uL (ref 4.0–10.5)

## 2014-09-24 NOTE — Progress Notes (Signed)
Renee Nakayama, MD [address], [address] [place] [address]    DIAGNOSIS:  Anemia   CKD   Iron deficiency   MGUS, monoclonal IgG kappa   Colonoscopy 03/30/2012 with external/internal hemorrhoids, small polyps and scattered   Diverticula   Bone survey 08/13/2013 with no findings suggestive of myeloma   CURRENT THERAPY: Observation  INTERVAL HISTORY: Renee Collins 73 y.o. female returns for follow-up of anemia and MGUS. She has no major complaints today. She reports she is doing very well. She was having some problems with anxiety that was affecting her sleep but Dr. Moshe Cipro put her on Prozac. She notes she is doing much better.  MEDICAL HISTORY: Past Medical History  Diagnosis Date  . Hypertension   . Depression   . Chronic back pain   . Anemia   . Hypothyroidism   . Diabetes mellitus   . Hyperlipidemia   . GERD (gastroesophageal reflux disease)     has THYROID NODULE; Hypothyroidism; Dyslipidemia; Anemia in chronic kidney disease(285.21); Depression; Essential hypertension; KNEE, ARTHRITIS, DEGEN./OSTEO; BAKER'S CYST, LEFT KNEE; Type 2 diabetes mellitus, controlled; Seasonal allergies; CKD (chronic kidney disease) stage 3, GFR 30-59 ml/min; Cataracts, bilateral; MGUS (monoclonal gammopathy of unknown significance); Iron deficiency anemia due to chronic blood loss; Onychomycosis; Osteopenia; OA (osteoarthritis) of knee; Effusion of knee joint, left; Medicare annual wellness visit, subsequent; and Need for prophylactic vaccination and inoculation against influenza on her problem list.     has No Known Allergies.  Renee Collins does not currently have medications on file.  SURGICAL HISTORY: Past Surgical History  Procedure Laterality Date  . Laminectomy  1980's  . Total abdominal hysterectomy  approx 1993    fibroids   . Spine surgery  approx 1983    dr Joya Salm  . Colonoscopy  03/30/2012    Procedure: COLONOSCOPY;  Surgeon: Rogene Houston, MD;  Location: [facility];  Service: Endoscopy;  Laterality: N/A;  730    SOCIAL HISTORY: History   Social History  . Marital Status: Divorced    Spouse Name: N/A  . Number of Children: 3  . Years of Education: N/A   Occupational History  . LPN    Social History Main Topics  . Smoking status: Never Smoker   . Smokeless tobacco: Never Used  . Alcohol Use: No  . Drug Use: No  . Sexual Activity: Not Currently   Other Topics Concern  . Not on file   Social History Narrative   Pt has 2 living adult children, 1 desease at age 76 secondary congential heart disease     FAMILY HISTORY: Family History  Problem Relation Age of Onset  . Heart attack Mother 70  . Heart disease Mother   . Cancer Mother     type unknown  . Diabetes Mother   . Kidney failure Father   . Kidney disease Father   . Myasthenia gravis Brother   . Cancer Sister 41    breast  . Diabetes Sister   . Heart disease Brother     Review of Systems  Constitutional: Negative for fever, chills, weight loss and malaise/fatigue.  HENT: Negative for congestion, hearing loss, nosebleeds, sore throat and tinnitus.   Eyes: Negative for blurred vision, double vision, pain and discharge.  Respiratory: Negative for cough, hemoptysis, sputum production, shortness of breath and wheezing.   Cardiovascular: Negative for chest pain, palpitations, claudication, leg swelling and PND.  Gastrointestinal: Negative for heartburn, nausea, vomiting, abdominal pain,  diarrhea, constipation, blood in stool and melena.  Genitourinary: Negative for dysuria, urgency, frequency and hematuria.  Musculoskeletal: Negative for myalgias, joint pain and falls.  Skin: Negative for itching and rash.  Neurological: Negative for dizziness, tingling, tremors, sensory change, speech change, focal weakness, seizures, loss of consciousness, weakness and headaches.  Endo/Heme/Allergies: Does not bruise/bleed easily.  Psychiatric/Behavioral: Negative for depression,  suicidal ideas, memory loss and substance abuse. The patient is not nervous/anxious and does not have insomnia.     PHYSICAL EXAMINATION  ECOG PERFORMANCE STATUS: 0 - Asymptomatic  There were no vitals filed for this visit.  Physical Exam  Constitutional: She is oriented to person, place, and time and well-developed, well-nourished, and in no distress.  HENT:  Head: Normocephalic and atraumatic.  Nose: Nose normal.  Mouth/Throat: Oropharynx is clear and moist. No oropharyngeal exudate.  Eyes: Conjunctivae and EOM are normal. Pupils are equal, round, and reactive to light. Right eye exhibits no discharge. Left eye exhibits no discharge. No scleral icterus.  Neck: Normal range of motion. Neck supple. No tracheal deviation present. No thyromegaly present.  Cardiovascular: Normal rate, regular rhythm and normal heart sounds.  Exam reveals no gallop and no friction rub.   No murmur heard. Pulmonary/Chest: Effort normal and breath sounds normal. She has no wheezes. She has no rales.  Abdominal: Soft. Bowel sounds are normal. She exhibits no distension and no mass. There is no tenderness. There is no rebound and no guarding.  Musculoskeletal: Normal range of motion. She exhibits no edema.  Lymphadenopathy:    She has no cervical adenopathy.  Neurological: She is alert and oriented to person, place, and time. She has normal reflexes. No cranial nerve deficit. Gait normal. Coordination normal.  Skin: Skin is warm and dry. No rash noted.  Psychiatric: Mood, memory, affect and judgment normal.  Nursing note and vitals reviewed.   LABORATORY DATA:  CBC    Component Value Date/Time   WBC 4.0 12/07/2013 0922   RBC 4.30 12/07/2013 0922   RBC 4.40 08/13/2013 1410   HGB 12.1 12/07/2013 0922   HCT 36.6 12/07/2013 0922   PLT 190 12/07/2013 0922   MCV 85.1 12/07/2013 0922   MCH 28.1 12/07/2013 0922   MCHC 33.1 12/07/2013 0922   RDW 14.0 12/07/2013 0922   LYMPHSABS 1.5 08/13/2013 1410    MONOABS 0.3 08/13/2013 1410   EOSABS 0.1 08/13/2013 1410   BASOSABS 0.0 08/13/2013 1410   CMP     Component Value Date/Time   NA 138 09/19/2014 1053   K 4.0 09/19/2014 1053   CL 105 09/19/2014 1053   CO2 24 09/19/2014 1053   GLUCOSE 96 09/19/2014 1053   BUN 22 09/19/2014 1053   CREATININE 1.44* 09/19/2014 1053   CREATININE 1.31* 08/13/2013 1410   CALCIUM 9.2 09/19/2014 1053   PROT 8.6* 09/19/2014 1053   ALBUMIN 4.0 09/19/2014 1053   AST 32 09/19/2014 1053   ALT 24 09/19/2014 1053   ALKPHOS 59 09/19/2014 1053   BILITOT 0.3 09/19/2014 1053   GFRNONAA 36* 09/19/2014 1053   GFRNONAA 39* 08/13/2013 1410   GFRAA 41* 09/19/2014 1053   GFRAA 45* 08/13/2013 1410     ASSESSMENT and THERAPY PLAN:   Anemia and iron deficiency  She has a mild anemia today and ferritin is currently pending.  If she needs additional iron replacement we will notify her.  Her last IV iron was in March 2015. Her last colonoscopy was in 2013. She is currently on yearly follow-ups and we  will schedule her at this point to return in a year.  MGUS  Labs are currently pending. I advised her we would notify her if there is any significant change. Otherwise if they remain stable we will continue with yearly observation. Last bone survey was in February [id number] and we should certainly consider repeating it next year.  All questions were answered. The patient knows to call the clinic with any problems, questions or concerns. We can certainly see the patient much sooner if necessary. This note was electronically signed. Molli Hazard 09/24/2014

## 2014-09-24 NOTE — Patient Instructions (Addendum)
..  [place] at [facility] Discharge Instructions  RECOMMENDATIONS MADE BY THE CONSULTANT AND ANY TEST RESULTS WILL BE SENT TO YOUR REFERRING PHYSICIAN. Exam per Dr. Whitney Muse Labs today Return in 6 months  Thank you for choosing [place] at [facility] to provide your oncology and hematology care.  To afford each patient quality time with our provider, please arrive at least 15 minutes before your scheduled appointment time.    You need to re-schedule your appointment should you arrive 10 or more minutes late.  We strive to give you quality time with our providers, and arriving late affects you and other patients whose appointments are after yours.  Also, if you no show three or more times for appointments you may be dismissed from the clinic at the providers discretion.     Again, thank you for choosing [facility].  Our hope is that these requests will decrease the amount of time that you wait before being seen by our physicians.       _____________________________________________________________  Should you have questions after your visit to [facility], please contact our office at [phone] between the hours of 8:30 a.m. and 4:30 p.m.  Voicemails left after 4:30 p.m. will not be returned until the following business day.  For prescription refill requests, have your pharmacy contact our office.

## 2014-09-24 NOTE — Progress Notes (Signed)
LABS DRAWN

## 2014-09-25 LAB — PROTEIN ELECTROPHORESIS, SERUM
A/G Ratio: 0.8 (ref 0.7–2.0)
ALBUMIN ELP: 3.7 g/dL (ref 3.2–5.6)
Alpha-1-Globulin: 0.3 g/dL (ref 0.1–0.4)
Alpha-2-Globulin: 1 g/dL (ref 0.4–1.2)
BETA GLOBULIN: 1 g/dL (ref 0.6–1.3)
GAMMA GLOBULIN: 2.6 g/dL — AB (ref 0.5–1.6)
Globulin, Total: 4.9 g/dL — ABNORMAL HIGH (ref 2.0–4.5)
M-Spike, %: 2.2 g/dL — ABNORMAL HIGH
Total Protein ELP: 8.6 g/dL — ABNORMAL HIGH (ref 6.0–8.5)

## 2014-09-25 LAB — IMMUNOFIXATION ELECTROPHORESIS
IGA: 74 mg/dL (ref 64–422)
IGG (IMMUNOGLOBIN G), SERUM: 3042 mg/dL — AB (ref 700–1600)
IGM, SERUM: 65 mg/dL (ref 26–217)
TOTAL PROTEIN ELP: 8.6 g/dL — AB (ref 6.0–8.5)

## 2014-09-25 LAB — IRON AND TIBC
Iron: 102 ug/dL (ref 42–145)
Saturation Ratios: 37 % (ref 20–55)
TIBC: 275 ug/dL (ref 250–470)
UIBC: 173 ug/dL (ref 125–400)

## 2014-09-25 LAB — IGG, IGA, IGM
IgA: 78 mg/dL (ref 64–422)
IgG (Immunoglobin G), Serum: 3148 mg/dL — ABNORMAL HIGH (ref 700–1600)
IgM, Serum: 69 mg/dL (ref 26–217)

## 2014-09-25 LAB — KAPPA/LAMBDA LIGHT CHAINS
Kappa free light chain: 191.56 mg/L — ABNORMAL HIGH (ref 3.30–19.40)
Kappa, lambda light chain ratio: 9.5 — ABNORMAL HIGH (ref 0.26–1.65)
Lambda free light chains: 20.17 mg/L (ref 5.71–26.30)

## 2014-09-25 LAB — FERRITIN: FERRITIN: 234 ng/mL (ref 10–291)

## 2014-09-29 NOTE — Assessment & Plan Note (Signed)
Controlled, no change in medication  

## 2014-09-29 NOTE — Assessment & Plan Note (Signed)
Controlled, no change in medication DASH diet and commitment to daily physical activity for a minimum of 30 minutes discussed and encouraged, as a part of hypertension management. The importance of attaining a healthy weight is also discussed.  BP/Weight 09/24/2014 09/19/2014 05/02/2014 12/11/2013 11/20/2013 [id number]  Systolic BP A999333 [id number] A999333  Diastolic BP 76 80 80 80 78 72 78  Wt. (Lbs) 196.9 197 204.4 195 193 - 185.4  BMI 32.77 32.78 34.01 32.45 31.17 - 29.94    CMP Latest Ref Rng 09/24/2014 09/19/2014 05/02/2014  Glucose 70 - 99 mg/dL 89 96 94  BUN 6 - 23 mg/dL 28(H) 22 26(H)  Creatinine 0.50 - 1.10 mg/dL 1.47(H) 1.44(H) 1.32(H)  Sodium 135 - 145 mmol/L 135 138 137  Potassium 3.5 - 5.1 mmol/L 3.8 4.0 3.9  Chloride 96 - 112 mmol/L 108 105 103  CO2 19 - 32 mmol/L 20 24 24   Calcium 8.4 - 10.5 mg/dL 9.1 9.2 9.5  Total Protein 6.0 - 8.3 g/dL 9.1(H) 8.6(H) 8.2  Total Bilirubin 0.3 - 1.2 mg/dL 0.4 0.3 0.5  Alkaline Phos 39 - 117 U/L 58 59 66  AST 0 - 37 U/L 29 32 24  ALT 0 - 35 U/L 27 24 25

## 2014-09-29 NOTE — Assessment & Plan Note (Signed)
deteriorated Patient educated about the importance of limiting  Carbohydrate intake , the need to commit to daily physical activity for a minimum of 30 minutes , and to commit weight loss. The fact that changes in all these areas will reduce or eliminate all together the development of diabetes is stressed.   Diabetic Labs Latest Ref Rng 09/24/2014 09/19/2014 05/02/2014 12/11/2013 12/07/2013  HbA1c <5.7 % - 6.9(H) 6.5(H) - 6.5(H)  Microalbumin 0.00 - 1.89 mg/dL - - - 14.20(H) -  Micro/Creat Ratio 0.0 - 30.0 mg/g - - - 52.0(H) -  Chol 0 - 200 mg/dL - 147 - - 175  HDL >=46 mg/dL - 44(L) - - 84  Calc LDL 0 - 99 mg/dL - 86 - - 81  Triglycerides <150 mg/dL - 85 - - 51  Creatinine 0.50 - 1.10 mg/dL 1.47(H) 1.44(H) 1.32(H) - 1.32(H)   BP/Weight 09/24/2014 09/19/2014 05/02/2014 12/11/2013 11/20/2013 123456 0000000  Systolic BP A999333 123456 [id number] [id number] A999333  Diastolic BP 76 80 80 80 78 72 78  Wt. (Lbs) 196.9 197 204.4 195 193 - 185.4  BMI 32.77 32.78 34.01 32.45 31.17 - 29.94   Foot/eye exam completion dates Latest Ref Rng 12/11/2013 11/12/2013  Eye Exam No Retinopathy - No Retinopathy  Foot Form Completion - Done -

## 2014-09-29 NOTE — Assessment & Plan Note (Signed)
Controlled, no change in medication Hyperlipidemia:Low fat diet discussed and encouraged.  CMP Latest Ref Rng 09/24/2014 09/19/2014 05/02/2014  Glucose 70 - 99 mg/dL 89 96 94  BUN 6 - 23 mg/dL 28(H) 22 26(H)  Creatinine 0.50 - 1.10 mg/dL 1.47(H) 1.44(H) 1.32(H)  Sodium 135 - 145 mmol/L 135 138 137  Potassium 3.5 - 5.1 mmol/L 3.8 4.0 3.9  Chloride 96 - 112 mmol/L 108 105 103  CO2 19 - 32 mmol/L 20 24 24   Calcium 8.4 - 10.5 mg/dL 9.1 9.2 9.5  Total Protein 6.0 - 8.3 g/dL 9.1(H) 8.6(H) 8.2  Total Bilirubin 0.3 - 1.2 mg/dL 0.4 0.3 0.5  Alkaline Phos 39 - 117 U/L 58 59 66  AST 0 - 37 U/L 29 32 24  ALT 0 - 35 U/L 27 24 25     Lipid Panel     Component Value Date/Time   CHOL 147 09/19/2014 1053   TRIG 85 09/19/2014 1053   HDL 44* 09/19/2014 1053   CHOLHDL 3.3 09/19/2014 1053   VLDL 17 09/19/2014 1053   LDLCALC 86 09/19/2014 1053

## 2014-09-29 NOTE — Assessment & Plan Note (Signed)
Has been out of medication due to formulary change with increase in cost Start fluoxetine, for GAD and mild depression Not suicidal or homicidal

## 2014-09-30 DIAGNOSIS — E038 Other specified hypothyroidism: Secondary | ICD-10-CM | POA: Diagnosis not present

## 2014-09-30 DIAGNOSIS — F329 Major depressive disorder, single episode, unspecified: Secondary | ICD-10-CM | POA: Diagnosis not present

## 2014-09-30 DIAGNOSIS — I10 Essential (primary) hypertension: Secondary | ICD-10-CM | POA: Diagnosis not present

## 2014-09-30 DIAGNOSIS — E785 Hyperlipidemia, unspecified: Secondary | ICD-10-CM | POA: Diagnosis not present

## 2014-09-30 DIAGNOSIS — Z23 Encounter for immunization: Secondary | ICD-10-CM | POA: Diagnosis not present

## 2014-10-02 ENCOUNTER — Encounter: Payer: Self-pay | Admitting: Family Medicine

## 2014-11-13 ENCOUNTER — Telehealth: Payer: Self-pay | Admitting: Family Medicine

## 2014-11-13 DIAGNOSIS — E119 Type 2 diabetes mellitus without complications: Secondary | ICD-10-CM

## 2014-11-13 NOTE — Telephone Encounter (Signed)
Referral entered  

## 2014-12-04 DIAGNOSIS — H5210 Myopia, unspecified eye: Secondary | ICD-10-CM | POA: Diagnosis not present

## 2014-12-04 DIAGNOSIS — H524 Presbyopia: Secondary | ICD-10-CM | POA: Diagnosis not present

## 2014-12-04 LAB — HM DIABETES EYE EXAM

## 2014-12-16 ENCOUNTER — Other Ambulatory Visit (HOSPITAL_COMMUNITY): Payer: Self-pay

## 2014-12-16 DIAGNOSIS — D472 Monoclonal gammopathy: Secondary | ICD-10-CM

## 2014-12-31 ENCOUNTER — Encounter (HOSPITAL_COMMUNITY): Payer: Commercial Managed Care - HMO | Attending: Hematology & Oncology

## 2014-12-31 DIAGNOSIS — D472 Monoclonal gammopathy: Secondary | ICD-10-CM | POA: Diagnosis not present

## 2014-12-31 LAB — CBC WITH DIFFERENTIAL/PLATELET
BASOS ABS: 0 10*3/uL (ref 0.0–0.1)
BASOS PCT: 1 % (ref 0–1)
Eosinophils Absolute: 0.3 10*3/uL (ref 0.0–0.7)
Eosinophils Relative: 7 % — ABNORMAL HIGH (ref 0–5)
HCT: 34.4 % — ABNORMAL LOW (ref 36.0–46.0)
HEMOGLOBIN: 11.2 g/dL — AB (ref 12.0–15.0)
Lymphocytes Relative: 38 % (ref 12–46)
Lymphs Abs: 1.5 10*3/uL (ref 0.7–4.0)
MCH: 27.4 pg (ref 26.0–34.0)
MCHC: 32.6 g/dL (ref 30.0–36.0)
MCV: 84.1 fL (ref 78.0–100.0)
Monocytes Absolute: 0.2 10*3/uL (ref 0.1–1.0)
Monocytes Relative: 6 % (ref 3–12)
NEUTROS ABS: 1.9 10*3/uL (ref 1.7–7.7)
NEUTROS PCT: 48 % (ref 43–77)
Platelets: 195 10*3/uL (ref 150–400)
RBC: 4.09 MIL/uL (ref 3.87–5.11)
RDW: 13.2 % (ref 11.5–15.5)
WBC: 4 10*3/uL (ref 4.0–10.5)

## 2014-12-31 LAB — COMPREHENSIVE METABOLIC PANEL
ALK PHOS: 63 U/L (ref 38–126)
ALT: 21 U/L (ref 14–54)
ANION GAP: 7 (ref 5–15)
AST: 26 U/L (ref 15–41)
Albumin: 4.1 g/dL (ref 3.5–5.0)
BUN: 30 mg/dL — AB (ref 6–20)
CO2: 22 mmol/L (ref 22–32)
CREATININE: 1.38 mg/dL — AB (ref 0.44–1.00)
Calcium: 9.2 mg/dL (ref 8.9–10.3)
Chloride: 109 mmol/L (ref 101–111)
GFR calc non Af Amer: 36 mL/min — ABNORMAL LOW (ref >60)
GFR, EST AFRICAN AMERICAN: 42 mL/min — AB (ref >60)
GLUCOSE: 115 mg/dL — AB (ref 65–99)
Potassium: 3.6 mmol/L (ref 3.5–5.1)
Sodium: 138 mmol/L (ref 135–145)
TOTAL PROTEIN: 9.2 g/dL — AB (ref 6.5–8.1)
Total Bilirubin: 0.7 mg/dL (ref 0.3–1.2)

## 2014-12-31 NOTE — Progress Notes (Signed)
Labs drawn

## 2015-01-01 LAB — MULTIPLE MYELOMA PANEL, SERUM
ALBUMIN SERPL ELPH-MCNC: 3.6 g/dL (ref 2.9–4.4)
ALPHA 1: 0.3 g/dL (ref 0.0–0.4)
Albumin/Glob SerPl: 0.8 (ref 0.7–1.7)
Alpha2 Glob SerPl Elph-Mcnc: 0.9 g/dL (ref 0.4–1.0)
B-Globulin SerPl Elph-Mcnc: 1.1 g/dL (ref 0.7–1.3)
Gamma Glob SerPl Elph-Mcnc: 2.5 g/dL — ABNORMAL HIGH (ref 0.4–1.8)
Globulin, Total: 4.8 g/dL — ABNORMAL HIGH (ref 2.2–3.9)
IGM, SERUM: 68 mg/dL (ref 26–217)
IgA: 60 mg/dL — ABNORMAL LOW (ref 64–422)
IgG (Immunoglobin G), Serum: 3099 mg/dL — ABNORMAL HIGH (ref 700–1600)
M Protein SerPl Elph-Mcnc: 2.3 g/dL — ABNORMAL HIGH
TOTAL PROTEIN ELP: 8.4 g/dL (ref 6.0–8.5)

## 2015-01-01 LAB — KAPPA/LAMBDA LIGHT CHAINS
KAPPA FREE LGHT CHN: 197.23 mg/L — AB (ref 3.30–19.40)
Kappa, lambda light chain ratio: 12.89 — ABNORMAL HIGH (ref 0.26–1.65)
Lambda free light chains: 15.3 mg/L (ref 5.71–26.30)

## 2015-01-10 ENCOUNTER — Other Ambulatory Visit (HOSPITAL_COMMUNITY): Payer: Self-pay

## 2015-01-10 DIAGNOSIS — D472 Monoclonal gammopathy: Secondary | ICD-10-CM

## 2015-02-07 DIAGNOSIS — E038 Other specified hypothyroidism: Secondary | ICD-10-CM | POA: Diagnosis not present

## 2015-02-07 DIAGNOSIS — E119 Type 2 diabetes mellitus without complications: Secondary | ICD-10-CM | POA: Diagnosis not present

## 2015-02-07 DIAGNOSIS — I10 Essential (primary) hypertension: Secondary | ICD-10-CM | POA: Diagnosis not present

## 2015-02-08 LAB — COMPLETE METABOLIC PANEL WITH GFR
ALT: 17 U/L (ref 6–29)
AST: 22 U/L (ref 10–35)
Albumin: 3.9 g/dL (ref 3.6–5.1)
Alkaline Phosphatase: 59 U/L (ref 33–130)
BILIRUBIN TOTAL: 0.4 mg/dL (ref 0.2–1.2)
BUN: 19 mg/dL (ref 7–25)
CALCIUM: 9.3 mg/dL (ref 8.6–10.4)
CO2: 22 mmol/L (ref 20–31)
Chloride: 105 mmol/L (ref 98–110)
Creat: 1.14 mg/dL — ABNORMAL HIGH (ref 0.60–0.93)
GFR, Est African American: 54 mL/min — ABNORMAL LOW (ref >=60)
GFR, Est Non African American: 47 mL/min — ABNORMAL LOW (ref >=60)
GLUCOSE: 101 mg/dL — AB (ref 65–99)
POTASSIUM: 3.8 mmol/L (ref 3.5–5.3)
Sodium: 140 mmol/L (ref 135–146)
Total Protein: 8.4 g/dL — ABNORMAL HIGH (ref 6.1–8.1)

## 2015-02-08 LAB — MICROALBUMIN / CREATININE URINE RATIO
Creatinine, Urine: 269.8 mg/dL
Microalb Creat Ratio: 54.1 mg/g — ABNORMAL HIGH (ref 0.0–30.0)
Microalb, Ur: 14.6 mg/dL — ABNORMAL HIGH (ref <2.0)

## 2015-02-08 LAB — TSH: TSH: 1.164 u[IU]/mL (ref 0.350–4.500)

## 2015-02-08 LAB — HEMOGLOBIN A1C
HEMOGLOBIN A1C: 6.6 % — AB (ref <5.7)
Mean Plasma Glucose: 143 mg/dL — ABNORMAL HIGH (ref <117)

## 2015-02-08 LAB — CBC
HCT: 35.3 % — ABNORMAL LOW (ref 36.0–46.0)
Hemoglobin: 11.6 g/dL — ABNORMAL LOW (ref 12.0–15.0)
MCH: 27.1 pg (ref 26.0–34.0)
MCHC: 32.9 g/dL (ref 30.0–36.0)
MCV: 82.5 fL (ref 78.0–100.0)
MPV: 9.8 fL (ref 8.6–12.4)
Platelets: 209 10*3/uL (ref 150–400)
RBC: 4.28 MIL/uL (ref 3.87–5.11)
RDW: 14.2 % (ref 11.5–15.5)
WBC: 4.8 10*3/uL (ref 4.0–10.5)

## 2015-02-12 ENCOUNTER — Ambulatory Visit (INDEPENDENT_AMBULATORY_CARE_PROVIDER_SITE_OTHER): Payer: Commercial Managed Care - HMO | Admitting: Family Medicine

## 2015-02-12 ENCOUNTER — Encounter: Payer: Self-pay | Admitting: Family Medicine

## 2015-02-12 VITALS — BP 126/74 | HR 88 | Resp 18 | Ht 65.0 in | Wt 192.0 lb

## 2015-02-12 DIAGNOSIS — Z0000 Encounter for general adult medical examination without abnormal findings: Secondary | ICD-10-CM | POA: Diagnosis not present

## 2015-02-12 DIAGNOSIS — E785 Hyperlipidemia, unspecified: Secondary | ICD-10-CM

## 2015-02-12 DIAGNOSIS — E119 Type 2 diabetes mellitus without complications: Secondary | ICD-10-CM | POA: Diagnosis not present

## 2015-02-12 DIAGNOSIS — I10 Essential (primary) hypertension: Secondary | ICD-10-CM | POA: Diagnosis not present

## 2015-02-12 NOTE — Assessment & Plan Note (Signed)

## 2015-02-12 NOTE — Patient Instructions (Signed)
F/u in  6 months, call if you need me before  Flu vaccine September    Congrats o n improved health  Fasting lipid, cmp and EGFr, HBa1C 2nd week in December  You need to return 3 stool cards in next 1 week, no stool in rectum  Thanks for choosing [facility], we consider it a privelige to serve you.  

## 2015-03-03 ENCOUNTER — Telehealth: Payer: Self-pay | Admitting: Family Medicine

## 2015-03-03 NOTE — Progress Notes (Signed)
   Subjective:    Patient ID: Renee Collins, female    DOB: 02-15-1941, 74 y.o.   MRN: [id number]  HPI Patient is in for annual physical exam. No other health concerns are expressed or addressed at the visit. Recent labs, if available are reviewed. Immunization is reviewed , and  updated if needed.    Review of Systems    See HPI  Objective:   Physical Exam  BP 126/74 mmHg  Pulse 88  Resp 18  Ht 5\' 5"  (1.651 m)  Wt 192 lb 0.6 oz (87.109 kg)  BMI 31.96 kg/m2  SpO2 97%  Pleasant well nourished female, alert and oriented x 3, in no cardio-pulmonary distress. Afebrile. HEENT No facial trauma or asymetry. Sinuses non tender.  Extra occullar muscles intact, pupils equally reactive to light. External ears normal, tympanic membranes clear. Oropharynx moist, no exudate, fair dentition.dentures Neck: supple, no adenopathy,JVD or thyromegaly.No bruits.  Chest: Clear to ascultation bilaterally.No crackles or wheezes. Non tender to palpation  Breast: No asymetry,no masses or lumps. No tenderness. No nipple discharge or inversion. No axillary or supraclavicular adenopathy  Cardiovascular system; Heart sounds normal,  S1 and  S2 ,no S3.  No murmur, or thrill. Apical beat not displaced Peripheral pulses normal.  Abdomen: Soft, non tender, no organomegaly or masses. No bruits. Bowel sounds normal. No guarding, tenderness or rebound.  Rectal:  Normal sphincter tone. No rectal masses. No stool in rectum  GU: External genitalia normal female genitalia , female distribution of hair. No lesions. Urethral meatus normal in size, no  Prolapse, no lesions visibly  Present. Bladder non tender. Vagina pink and moist , with no visible lesions , discharge present . Adequate pelvic support no  cystocele or rectocele noted Uterus absent no adnexal masses, no  adnexal tenderness.   Musculoskeletal exam: Full ROM of spine, hips , shoulders and knees. No deformity ,swelling or  crepitus noted. No muscle wasting or atrophy.   Neurologic: Cranial nerves 2 to 12 intact. Power, tone ,sensation and reflexes normal throughout. No disturbance in gait. No tremor.  Skin: Intact, no ulceration, erythema , scaling or rash noted. Pigmentation normal throughout  Psych; Normal mood and affect. Judgement and concentration normal       Assessment & Plan:  Annual physical exam Annual exam as documented. Counseling done  re healthy lifestyle involving commitment to 150 minutes exercise per week, heart healthy diet, and attaining healthy weight.The importance of adequate sleep also discussed. Regular seat belt use and home safety, is also discussed. Changes in health habits are decided on by the patient with goals and time frames  set for achieving them. Immunization and cancer screening needs are specifically addressed at this visit.

## 2015-03-03 NOTE — Telephone Encounter (Signed)
pls call pt  Still needs to bring in 3 stool cards, rectum empty at physical in August, also offer flu vaccine

## 2015-03-04 NOTE — Telephone Encounter (Signed)
Reminder sent

## 2015-03-07 DIAGNOSIS — Z1211 Encounter for screening for malignant neoplasm of colon: Secondary | ICD-10-CM

## 2015-03-07 LAB — HEMOCCULT GUIAC POC 1CARD (OFFICE)
FECAL OCCULT BLD: NEGATIVE
FECAL OCCULT BLD: NEGATIVE
Fecal Occult Blood, POC: NEGATIVE

## 2015-03-11 ENCOUNTER — Ambulatory Visit (INDEPENDENT_AMBULATORY_CARE_PROVIDER_SITE_OTHER): Payer: Commercial Managed Care - HMO

## 2015-03-11 DIAGNOSIS — Z23 Encounter for immunization: Secondary | ICD-10-CM

## 2015-03-28 ENCOUNTER — Other Ambulatory Visit: Payer: Self-pay

## 2015-03-28 ENCOUNTER — Encounter: Payer: Self-pay | Admitting: Family Medicine

## 2015-03-28 DIAGNOSIS — E785 Hyperlipidemia, unspecified: Secondary | ICD-10-CM

## 2015-03-28 DIAGNOSIS — F329 Major depressive disorder, single episode, unspecified: Secondary | ICD-10-CM

## 2015-03-28 DIAGNOSIS — F32A Depression, unspecified: Secondary | ICD-10-CM

## 2015-03-28 DIAGNOSIS — I10 Essential (primary) hypertension: Secondary | ICD-10-CM

## 2015-03-28 MED ORDER — HYDROCHLOROTHIAZIDE 25 MG PO TABS: ORAL_TABLET | ORAL | Status: DC

## 2015-03-28 MED ORDER — LEVOTHYROXINE SODIUM 50 MCG PO TABS: ORAL_TABLET | ORAL | Status: DC

## 2015-03-28 MED ORDER — PRAVASTATIN SODIUM 20 MG PO TABS: 20.0000 mg | ORAL_TABLET | Freq: Every evening | ORAL | Status: DC

## 2015-03-28 MED ORDER — AMLODIPINE BESYLATE 10 MG PO TABS: 10.0000 mg | ORAL_TABLET | Freq: Every day | ORAL | Status: DC

## 2015-03-28 MED ORDER — BENAZEPRIL HCL 20 MG PO TABS: 20.0000 mg | ORAL_TABLET | Freq: Every day | ORAL | Status: DC

## 2015-03-28 MED ORDER — FLUOXETINE HCL 10 MG PO TABS: 10.0000 mg | ORAL_TABLET | Freq: Every day | ORAL | Status: DC

## 2015-04-11 ENCOUNTER — Other Ambulatory Visit: Payer: Self-pay | Admitting: Family Medicine

## 2015-04-11 ENCOUNTER — Encounter (HOSPITAL_COMMUNITY): Payer: Commercial Managed Care - HMO | Attending: Hematology & Oncology

## 2015-04-11 DIAGNOSIS — D472 Monoclonal gammopathy: Secondary | ICD-10-CM

## 2015-04-11 DIAGNOSIS — Z1231 Encounter for screening mammogram for malignant neoplasm of breast: Secondary | ICD-10-CM

## 2015-04-11 LAB — CBC WITH DIFFERENTIAL/PLATELET
Basophils Absolute: 0 10*3/uL (ref 0.0–0.1)
Basophils Relative: 0 %
EOS ABS: 0.2 10*3/uL (ref 0.0–0.7)
Eosinophils Relative: 4 %
HCT: 35.6 % — ABNORMAL LOW (ref 36.0–46.0)
HEMOGLOBIN: 11.7 g/dL — AB (ref 12.0–15.0)
LYMPHS ABS: 1.9 10*3/uL (ref 0.7–4.0)
LYMPHS PCT: 40 %
MCH: 27.5 pg (ref 26.0–34.0)
MCHC: 32.9 g/dL (ref 30.0–36.0)
MCV: 83.8 fL (ref 78.0–100.0)
Monocytes Absolute: 0.3 10*3/uL (ref 0.1–1.0)
Monocytes Relative: 7 %
NEUTROS ABS: 2.3 10*3/uL (ref 1.7–7.7)
NEUTROS PCT: 49 %
Platelets: 207 10*3/uL (ref 150–400)
RBC: 4.25 MIL/uL (ref 3.87–5.11)
RDW: 13.2 % (ref 11.5–15.5)
WBC: 4.7 10*3/uL (ref 4.0–10.5)

## 2015-04-11 LAB — COMPREHENSIVE METABOLIC PANEL
ALBUMIN: 4.1 g/dL (ref 3.5–5.0)
ALK PHOS: 63 U/L (ref 38–126)
ALT: 22 U/L (ref 14–54)
ANION GAP: 8 (ref 5–15)
AST: 27 U/L (ref 15–41)
BILIRUBIN TOTAL: 0.6 mg/dL (ref 0.3–1.2)
BUN: 35 mg/dL — ABNORMAL HIGH (ref 6–20)
CALCIUM: 9.5 mg/dL (ref 8.9–10.3)
CO2: 21 mmol/L — ABNORMAL LOW (ref 22–32)
Chloride: 108 mmol/L (ref 101–111)
Creatinine, Ser: 1.59 mg/dL — ABNORMAL HIGH (ref 0.44–1.00)
GFR, EST AFRICAN AMERICAN: 36 mL/min — AB (ref >60)
GFR, EST NON AFRICAN AMERICAN: 31 mL/min — AB (ref >60)
GLUCOSE: 107 mg/dL — AB (ref 65–99)
Potassium: 4.1 mmol/L (ref 3.5–5.1)
Sodium: 137 mmol/L (ref 135–145)
TOTAL PROTEIN: 9.5 g/dL — AB (ref 6.5–8.1)

## 2015-04-11 NOTE — Progress Notes (Signed)
Labs drawn

## 2015-04-13 LAB — KAPPA/LAMBDA LIGHT CHAINS
Kappa free light chain: 237.56 mg/L — ABNORMAL HIGH (ref 3.30–19.40)
Kappa, lambda light chain ratio: 14.15 — ABNORMAL HIGH (ref 0.26–1.65)
Lambda free light chains: 16.79 mg/L (ref 5.71–26.30)

## 2015-04-14 LAB — MULTIPLE MYELOMA PANEL, SERUM
ALBUMIN/GLOB SERPL: 0.8 (ref 0.7–1.7)
ALPHA 1: 0.3 g/dL (ref 0.0–0.4)
ALPHA2 GLOB SERPL ELPH-MCNC: 1.1 g/dL — AB (ref 0.4–1.0)
Albumin SerPl Elph-Mcnc: 3.8 g/dL (ref 2.9–4.4)
B-Globulin SerPl Elph-Mcnc: 1.2 g/dL (ref 0.7–1.3)
GAMMA GLOB SERPL ELPH-MCNC: 2.6 g/dL — AB (ref 0.4–1.8)
GLOBULIN, TOTAL: 5.2 g/dL — AB (ref 2.2–3.9)
IgA: 83 mg/dL (ref 64–422)
IgG (Immunoglobin G), Serum: 3194 mg/dL — ABNORMAL HIGH (ref 700–1600)
IgM, Serum: 61 mg/dL (ref 26–217)
M Protein SerPl Elph-Mcnc: 2.4 g/dL — ABNORMAL HIGH
Total Protein ELP: 9 g/dL — ABNORMAL HIGH (ref 6.0–8.5)

## 2015-05-05 ENCOUNTER — Ambulatory Visit (HOSPITAL_COMMUNITY)
Admission: RE | Admit: 2015-05-05 | Discharge: 2015-05-05 | Disposition: A | Discharge status: Home / Self Care | Payer: Commercial Managed Care - HMO | Source: Ambulatory Visit | Attending: Family Medicine | Admitting: Family Medicine

## 2015-05-05 DIAGNOSIS — Z1231 Encounter for screening mammogram for malignant neoplasm of breast: Secondary | ICD-10-CM | POA: Diagnosis not present

## 2015-05-08 ENCOUNTER — Other Ambulatory Visit (HOSPITAL_COMMUNITY): Payer: Self-pay

## 2015-05-08 DIAGNOSIS — N189 Chronic kidney disease, unspecified: Principal | ICD-10-CM

## 2015-05-08 DIAGNOSIS — D631 Anemia in chronic kidney disease: Secondary | ICD-10-CM

## 2015-05-16 ENCOUNTER — Encounter (HOSPITAL_COMMUNITY): Payer: Commercial Managed Care - HMO | Attending: Hematology & Oncology | Admitting: Hematology & Oncology

## 2015-05-16 ENCOUNTER — Encounter (HOSPITAL_BASED_OUTPATIENT_CLINIC_OR_DEPARTMENT_OTHER): Payer: Commercial Managed Care - HMO

## 2015-05-16 ENCOUNTER — Encounter (HOSPITAL_COMMUNITY): Payer: Self-pay | Admitting: Hematology & Oncology

## 2015-05-16 VITALS — BP 127/76 | HR 69 | Temp 98.0°F | Resp 16 | Wt 189.6 lb

## 2015-05-16 DIAGNOSIS — D500 Iron deficiency anemia secondary to blood loss (chronic): Secondary | ICD-10-CM | POA: Diagnosis not present

## 2015-05-16 DIAGNOSIS — N189 Chronic kidney disease, unspecified: Secondary | ICD-10-CM

## 2015-05-16 DIAGNOSIS — D472 Monoclonal gammopathy: Secondary | ICD-10-CM | POA: Diagnosis not present

## 2015-05-16 DIAGNOSIS — D631 Anemia in chronic kidney disease: Secondary | ICD-10-CM

## 2015-05-16 DIAGNOSIS — D649 Anemia, unspecified: Secondary | ICD-10-CM

## 2015-05-16 DIAGNOSIS — E611 Iron deficiency: Secondary | ICD-10-CM

## 2015-05-16 DIAGNOSIS — N183 Chronic kidney disease, stage 3 (moderate): Secondary | ICD-10-CM

## 2015-05-16 HISTORY — PX: BONE MARROW ASPIRATION: SHX1252

## 2015-05-16 HISTORY — PX: BONE MARROW BIOPSY: SHX199

## 2015-05-16 LAB — CBC WITH DIFFERENTIAL/PLATELET
BASOS ABS: 0 10*3/uL (ref 0.0–0.1)
BASOS PCT: 0 %
Eosinophils Absolute: 0.2 10*3/uL (ref 0.0–0.7)
Eosinophils Relative: 5 %
HEMATOCRIT: 35.2 % — AB (ref 36.0–46.0)
HEMOGLOBIN: 11.4 g/dL — AB (ref 12.0–15.0)
Lymphocytes Relative: 28 %
Lymphs Abs: 1.1 10*3/uL (ref 0.7–4.0)
MCH: 27.6 pg (ref 26.0–34.0)
MCHC: 32.4 g/dL (ref 30.0–36.0)
MCV: 85.2 fL (ref 78.0–100.0)
MONO ABS: 0.4 10*3/uL (ref 0.1–1.0)
Monocytes Relative: 9 %
NEUTROS ABS: 2.4 10*3/uL (ref 1.7–7.7)
NEUTROS PCT: 58 %
Platelets: 196 10*3/uL (ref 150–400)
RBC: 4.13 MIL/uL (ref 3.87–5.11)
RDW: 12.8 % (ref 11.5–15.5)
WBC: 4 10*3/uL (ref 4.0–10.5)

## 2015-05-16 LAB — BONE MARROW EXAM

## 2015-05-16 MED ORDER — LIDOCAINE HCL (PF) 1 % IJ SOLN
INTRAMUSCULAR | Status: AC
  Filled 2015-05-16: qty 5

## 2015-05-16 MED ORDER — HYDROCODONE-ACETAMINOPHEN 5-325 MG PO TABS: ORAL_TABLET | ORAL | Status: DC

## 2015-05-16 NOTE — Patient Instructions (Signed)
Bone Marrow Aspiration and Bone Marrow Biopsy, Care After Refer to this sheet in the next few weeks. These instructions provide you with information about caring for yourself after your procedure. Your health care provider may also give you more specific instructions. Your treatment has been planned according to current medical practices, but problems sometimes occur. Call your health care provider if you have any problems or questions after your procedure. WHAT TO EXPECT AFTER THE PROCEDURE After your procedure, it is common to have:  Soreness or tenderness around the puncture site.  Bruising. HOME CARE INSTRUCTIONS  Take medicines only as directed by your health care provider.  You can take Hydrocodone 5-325 mg 1/2 to 1 tablet every 4 hours as needed for pain.  Follow your health care provider's instructions about:  Puncture site care.  Bandage (dressing) changes and removal. - keep dressing clean and dry for 12 - 24 hours.  Bathe and shower as directed by your health care provider.  Check your puncture site every day for signs of infection. Watch for:  Redness, swelling, or pain.  Fluid, blood, or pus.  Return to your normal activities as directed by your health care provider.  Keep all follow-up visits as directed by your health care provider. This is important. SEEK MEDICAL CARE IF:  You have a fever.  You have uncontrollable bleeding.  You have redness, swelling, or pain at the site of your puncture.  You have fluid, blood, or pus coming from your puncture site.  Follow-up: Office visit in 2 weeks to discuss test results.   This information is not intended to replace advice given to you by your health care provider. Make sure you discuss any questions you have with your health care provider.   Document Released: 01/01/2005 Document Revised: 10/29/2014 Document Reviewed: 06/05/2014 Elsevier Interactive Patient Education [employer].

## 2015-05-16 NOTE — Progress Notes (Signed)
[place] PROGRESS NOTE  Renee Collins presents for Bone Marrow biopsy per MD orders. Renee Collins verbalized understanding of procedure. Consent reviewed and signed.  Renee Collins positioned supine for procedure. Time-out performed and Bone Marrow Checklist. Procedure began at 0900. Xylocaine 1% 15 cc used for local and administered to patient by Dr. Whitney Muse. Procedure completed at [phone]. Patient tolerated well. Pressure dressing applied to the left hip with instructions to leave in place for 24 hours. Patient instructed to report any bleeding that saturates dressing and to take pain medication Hydrocodone5-325 mg 1/2 to 1 tablet every 4 hours as needed for pain. Dressing dry and intact to the left hip on discharge.    Renee Collins presented for labwork. Labs per MD order drawn via Peripheral Line 23 gauge needle inserted in left AC  Good blood return present. Procedure without incident.  Needle removed intact. Patient tolerated procedure well.

## 2015-05-16 NOTE — Progress Notes (Signed)
Renee Nakayama, MD [address] [place] [address]    DIAGNOSIS:   Anemia CKD Iron deficiency MGUS, monoclonal IgG kappa Colonoscopy 03/30/2012 with external/internal hemorrhoids, small polyps and scattered Diverticula Bone survey 08/13/2013 with no findings suggestive of myeloma  CURRENT THERAPY: Observation  INTERVAL HISTORY: Renee Collins 74 y.o. female returns for follow-up of anemia and MGUS. She has no major complaints today. She reports she is doing very well. She was having some problems with anxiety that was affecting her sleep but Dr. Moshe Cipro put her on Prozac. She notes she is doing much better.   Labs have showed some progression in her M spike, slight worsening of her anemia and renal function.  She presents today for BMBX and aspirate for further evaluation.  MEDICAL HISTORY: Past Medical History  Diagnosis Date  . Hypertension   . Depression   . Chronic back pain   . Anemia   . Hypothyroidism   . Diabetes mellitus   . Hyperlipidemia   . GERD (gastroesophageal reflux disease)     has THYROID NODULE; Hypothyroidism; Dyslipidemia; Anemia in chronic renal disease; Depression; Essential hypertension; KNEE, ARTHRITIS, DEGEN./OSTEO; BAKER'S CYST, LEFT KNEE; Type 2 diabetes mellitus, controlled ([place]); Seasonal allergies; CKD (chronic kidney disease) stage 3, GFR 30-59 ml/min; Cataracts, bilateral; MGUS (monoclonal gammopathy of unknown significance); Iron deficiency anemia due to chronic blood loss; Onychomycosis; Osteopenia; OA (osteoarthritis) of knee; Effusion of knee joint, left; and Annual physical exam on her problem list.     has No Known Allergies.  Ms. Tamez does not currently have medications on file.  SURGICAL HISTORY: Past Surgical History  Procedure Laterality Date  . Laminectomy  1980's  . Total abdominal hysterectomy  approx 1993    fibroids   . Spine surgery  approx 1983    dr Joya Salm  . Colonoscopy  03/30/2012    Procedure:  COLONOSCOPY;  Surgeon: Rogene Houston, MD;  Location: [facility];  Service: Endoscopy;  Laterality: N/A;  730  . Bone marrow aspiration Left 05/16/15  . Bone marrow biopsy Left 05/16/15    SOCIAL HISTORY: Social History   Social History  . Marital Status: Divorced    Spouse Name: N/A  . Number of Children: 3  . Years of Education: N/A   Occupational History  . LPN    Social History Main Topics  . Smoking status: Never Smoker   . Smokeless tobacco: Never Used  . Alcohol Use: No  . Drug Use: No  . Sexual Activity: Not Currently   Other Topics Concern  . Not on file   Social History Narrative   Pt has 2 living adult children, 1 desease at age 10 secondary congential heart disease     FAMILY HISTORY: Family History  Problem Relation Age of Onset  . Heart attack Mother 39  . Heart disease Mother   . Cancer Mother     type unknown  . Diabetes Mother   . Kidney failure Father   . Kidney disease Father   . Myasthenia gravis Brother   . Cancer Sister 44    breast  . Diabetes Sister   . Heart disease Brother     Review of Systems  Constitutional: Negative for fever, chills, weight loss and malaise/fatigue.  HENT: Negative for congestion, hearing loss, nosebleeds, sore throat and tinnitus.   Eyes: Negative for blurred vision, double vision, pain and discharge.  Respiratory: Negative for cough, hemoptysis, sputum production, shortness of breath  and wheezing.   Cardiovascular: Negative for chest pain, palpitations, claudication, leg swelling and PND.  Gastrointestinal: Negative for heartburn, nausea, vomiting, abdominal pain, diarrhea, constipation, blood in stool and melena.  Genitourinary: Negative for dysuria, urgency, frequency and hematuria.  Musculoskeletal: Negative for myalgias, joint pain and falls.  Skin: Negative for itching and rash.  Neurological: Negative for dizziness, tingling, tremors, sensory change, speech change, focal weakness, seizures, loss of  consciousness, weakness and headaches.  Endo/Heme/Allergies: Does not bruise/bleed easily.  Psychiatric/Behavioral: Negative for depression, suicidal ideas, memory loss and substance abuse. The patient is not nervous/anxious and does not have insomnia.    14 point review of systems was performed and is negative except as detailed under history of present illness and above   PHYSICAL EXAMINATION  ECOG PERFORMANCE STATUS: 0 - Asymptomatic  Filed Vitals:   05/16/15 0928 05/16/15 0945  BP: 141/78 127/76  Pulse: 76 69  Temp:    Resp: 18 16    Physical Exam  Constitutional: She is oriented to person, place, and time and well-developed, well-nourished, and in no distress.  HENT:  Head: Normocephalic and atraumatic.  Nose: Nose normal.  Mouth/Throat: Oropharynx is clear and moist. No oropharyngeal exudate.  Eyes: Conjunctivae and EOM are normal. Pupils are equal, round, and reactive to light. Right eye exhibits no discharge. Left eye exhibits no discharge. No scleral icterus.  Neck: Normal range of motion. Neck supple. No tracheal deviation present. No thyromegaly present.  Cardiovascular: Normal rate, regular rhythm and normal heart sounds.  Exam reveals no gallop and no friction rub.   No murmur heard. Pulmonary/Chest: Effort normal and breath sounds normal. She has no wheezes. She has no rales.  Abdominal: Soft. Bowel sounds are normal. She exhibits no distension and no mass. There is no tenderness. There is no rebound and no guarding.  Musculoskeletal: Normal range of motion. She exhibits no edema.  Lymphadenopathy:    She has no cervical adenopathy.  Neurological: She is alert and oriented to person, place, and time. She has normal reflexes. No cranial nerve deficit. Gait normal. Coordination normal.  Skin: Skin is warm and dry. No rash noted.  Psychiatric: Mood, memory, affect and judgment normal.  Nursing note and vitals reviewed.   Bone Marrow Biopsy and Aspiration Procedure  Note   Informed consent was obtained and potential risks including bleeding, infection and pain were reviewed with the patient. I verified that the patient has been fasting since midnight.  The patient's name, date of birth, identification, consent and allergies were verified prior to the start of procedure and time out was performed.  The left posterior iliac crest was chosen as the site of biopsy.  The skin was prepped with Betadine solution.   8 cc of 1% lidocaine was used to provide local anaesthesia.   10 cc of bone marrow aspirate was obtained followed by 1/2 inch biopsy. Note a 5.5 inch jam sheedy needle was utilized and while obtaining core was at the needle handle therefore only a small core biopsy was obtained. Pressure was applied to the biopsy site and bandage was placed over the biopsy site. Patient was made to lie on back for 30 mins prior to discharge.  The procedure was tolerated well. COMPLICATIONS: None BLOOD LOSS: none The patient was discharged home in stable condition with a 1 week follow up to review results. She was provided with post bone marrow biopsy instructions and instructed to call if there was any bleeding or worsening pain.  Specimens sent  for flow cytometry, cytogenetics and additional studies.    LABORATORY DATA: I have reviewed the data as listed  CBC    Component Value Date/Time   WBC 4.0 05/16/2015 0815   RBC 4.13 05/16/2015 0815   RBC 4.40 08/13/2013 1410   HGB 11.4* 05/16/2015 0815   HCT 35.2* 05/16/2015 0815   PLT 196 05/16/2015 0815   MCV 85.2 05/16/2015 0815   MCH 27.6 05/16/2015 0815   MCHC 32.4 05/16/2015 0815   RDW 12.8 05/16/2015 0815   LYMPHSABS 1.1 05/16/2015 0815   MONOABS 0.4 05/16/2015 0815   EOSABS 0.2 05/16/2015 0815   BASOSABS 0.0 05/16/2015 0815   CMP     Component Value Date/Time   NA 137 04/11/2015 1107   K 4.1 04/11/2015 1107   CL 108 04/11/2015 1107   CO2 21* 04/11/2015 1107   GLUCOSE 107* 04/11/2015 1107     BUN 35* 04/11/2015 1107   CREATININE 1.59* 04/11/2015 1107   CREATININE 1.14* 02/07/2015 0911   CALCIUM 9.5 04/11/2015 1107   PROT 9.5* 04/11/2015 1107   ALBUMIN 4.1 04/11/2015 1107   AST 27 04/11/2015 1107   ALT 22 04/11/2015 1107   ALKPHOS 63 04/11/2015 1107   BILITOT 0.6 04/11/2015 1107   GFRNONAA 31* 04/11/2015 1107   GFRNONAA 47* 02/07/2015 0911   GFRAA 36* 04/11/2015 1107   GFRAA 54* 02/07/2015 0911    ASSESSMENT and THERAPY PLAN:   Anemia and iron deficiency MGUS CKD, Stage III  Her labs have slightly worsened and she presented today for BMBX for further evaluation.  I am concerned however about the quality of the core biopsy.  I addressed this with her and advised her that in the future she will have to go to [facility] for IR to perform the procedure.  We will regroup in 2 weeks to see if we obtained the necessary specimen for further evaluation of her marrow.  She will need a repeat myeloma survey in February.  She knows to notify us if she has any problems at the bone marrow site. She did well with the procedure.  A total of 40 minutes was spent in direct patient contact today.   All questions were answered. The patient knows to call the clinic with any problems, questions or concerns. We can certainly see the patient much sooner if necessary.  This document serves as a record of services personally performed by Ancil Linsey, MD. It was created on her behalf by Toni Amend, a trained medical scribe. The creation of this record is based on the scribe's personal observations and the provider's statements to them. This document has been checked and approved by the attending provider.  I have reviewed the above documentation for accuracy and completeness, and I agree with the above.  Taijon Vink [employer]

## 2015-05-19 NOTE — Progress Notes (Signed)
Labs drawn

## 2015-05-28 NOTE — Progress Notes (Signed)
Renee Nakayama, MD [address] [place] [address]    DIAGNOSIS:   Anemia CKD Iron deficiency MGUS, monoclonal IgG kappa Colonoscopy 03/30/2012 with external/internal hemorrhoids, small polyps and scattered Diverticula Bone survey 08/13/2013 with no findings suggestive of myeloma Bone Marrow 04/2015 poor sampling   CURRENT THERAPY: Observation  INTERVAL HISTORY: Renee Collins 74 y.o. female returns for follow-up of anemia and MGUS. She has no major complaints today. She reports she is doing very well.  She is here today to review BMBX results.  She states she had some mild leg stiffness after the procedure but it is gone. She has no complaints today. She had a good Thanksgiving with family. She denies fever, chills, nausea or vomiting.  MEDICAL HISTORY: Past Medical History  Diagnosis Date  . Hypertension   . Depression   . Chronic back pain   . Anemia   . Hypothyroidism   . Diabetes mellitus   . Hyperlipidemia   . GERD (gastroesophageal reflux disease)     has THYROID NODULE; Hypothyroidism; Dyslipidemia; Anemia in chronic renal disease; Depression; Essential hypertension; KNEE, ARTHRITIS, DEGEN./OSTEO; BAKER'S CYST, LEFT KNEE; Type 2 diabetes mellitus, controlled ([place]); Seasonal allergies; CKD (chronic kidney disease) stage 3, GFR 30-59 ml/min; Cataracts, bilateral; MGUS (monoclonal gammopathy of unknown significance); Iron deficiency anemia due to chronic blood loss; Onychomycosis; Osteopenia; OA (osteoarthritis) of knee; Effusion of knee joint, left; and Annual physical exam on her problem list.     has No Known Allergies.  Renee Collins had no medications administered during this visit.  SURGICAL HISTORY: Past Surgical History  Procedure Laterality Date  . Laminectomy  1980's  . Total abdominal hysterectomy  approx 1993    fibroids   . Spine surgery  approx 1983    dr Joya Salm  . Colonoscopy  03/30/2012    Procedure: COLONOSCOPY;  Surgeon:  Rogene Houston, MD;  Location: [facility];  Service: Endoscopy;  Laterality: N/A;  730  . Bone marrow aspiration Left 05/16/15  . Bone marrow biopsy Left 05/16/15    SOCIAL HISTORY: Social History   Social History  . Marital Status: Divorced    Spouse Name: N/A  . Number of Children: 3  . Years of Education: N/A   Occupational History  . LPN    Social History Main Topics  . Smoking status: Never Smoker   . Smokeless tobacco: Never Used  . Alcohol Use: No  . Drug Use: No  . Sexual Activity: Not Currently   Other Topics Concern  . Not on file   Social History Narrative   Pt has 2 living adult children, 1 desease at age 29 secondary congential heart disease     FAMILY HISTORY: Family History  Problem Relation Age of Onset  . Heart attack Mother 14  . Heart disease Mother   . Cancer Mother     type unknown  . Diabetes Mother   . Kidney failure Father   . Kidney disease Father   . Myasthenia gravis Brother   . Cancer Sister 16    breast  . Diabetes Sister   . Heart disease Brother     Review of Systems  Constitutional: Negative for fever, chills, weight loss and malaise/fatigue.  HENT: Negative for congestion, hearing loss, nosebleeds, sore throat and tinnitus.   Eyes: Negative for blurred vision, double vision, pain and discharge.  Respiratory: Negative for cough, hemoptysis, sputum production, shortness of breath and wheezing.   Cardiovascular: Negative  for chest pain, palpitations, claudication, leg swelling and PND.  Gastrointestinal: Negative for heartburn, nausea, vomiting, abdominal pain, diarrhea, constipation, blood in stool and melena.  Genitourinary: Negative for dysuria, urgency, frequency and hematuria.  Musculoskeletal: Negative for myalgias, joint pain and falls.  Skin: Negative for itching and rash.  Neurological: Negative for dizziness, tingling, tremors, sensory change, speech change, focal weakness, seizures, loss of consciousness, weakness  and headaches.  Endo/Heme/Allergies: Does not bruise/bleed easily.  Psychiatric/Behavioral: Negative for depression, suicidal ideas, memory loss and substance abuse. The patient is not nervous/anxious and does not have insomnia.   14 point review of systems was performed and is negative except as detailed under history of present illness and above   PHYSICAL EXAMINATION  ECOG PERFORMANCE STATUS: 0 - Asymptomatic  Filed Vitals:   05/30/15 0833  BP: 129/71  Pulse: 69  Temp: 97.6 F (36.4 C)  Resp: 15    Physical Exam  Constitutional: She is oriented to person, place, and time and well-developed, well-nourished, and in no distress.  HENT:  Head: Normocephalic and atraumatic.  Nose: Nose normal.  Mouth/Throat: Oropharynx is clear and moist. No oropharyngeal exudate.  Eyes: Conjunctivae and EOM are normal. Pupils are equal, round, and reactive to light. Right eye exhibits no discharge. Left eye exhibits no discharge. No scleral icterus.  Neck: Normal range of motion. Neck supple. No tracheal deviation present. No thyromegaly present.  Cardiovascular: Normal rate, regular rhythm and normal heart sounds.  Exam reveals no gallop and no friction rub.   No murmur heard. Pulmonary/Chest: Effort normal and breath sounds normal. She has no wheezes. She has no rales.  Abdominal: Soft. Bowel sounds are normal. She exhibits no distension and no mass. There is no tenderness. There is no rebound and no guarding.  Musculoskeletal: Normal range of motion. She exhibits no edema.  Lymphadenopathy:    She has no cervical adenopathy.  Neurological: She is alert and oriented to person, place, and time. She has normal reflexes. No cranial nerve deficit. Gait normal. Coordination normal.  Skin: Skin is warm and dry. No rash noted.  Psychiatric: Mood, memory, affect and judgment normal.  Nursing note and vitals reviewed.   LABORATORY DATA: I have reviewed the data as listed. CBC    Component Value  Date/Time   WBC 4.0 05/16/2015 0815   RBC 4.13 05/16/2015 0815   RBC 4.40 08/13/2013 1410   HGB 11.4* 05/16/2015 0815   HCT 35.2* 05/16/2015 0815   PLT 196 05/16/2015 0815   MCV 85.2 05/16/2015 0815   MCH 27.6 05/16/2015 0815   MCHC 32.4 05/16/2015 0815   RDW 12.8 05/16/2015 0815   LYMPHSABS 1.1 05/16/2015 0815   MONOABS 0.4 05/16/2015 0815   EOSABS 0.2 05/16/2015 0815   BASOSABS 0.0 05/16/2015 0815   CMP     Component Value Date/Time   NA 137 04/11/2015 1107   K 4.1 04/11/2015 1107   CL 108 04/11/2015 1107   CO2 21* 04/11/2015 1107   GLUCOSE 107* 04/11/2015 1107   BUN 35* 04/11/2015 1107   CREATININE 1.59* 04/11/2015 1107   CREATININE 1.14* 02/07/2015 0911   CALCIUM 9.5 04/11/2015 1107   PROT 9.5* 04/11/2015 1107   ALBUMIN 4.1 04/11/2015 1107   AST 27 04/11/2015 1107   ALT 22 04/11/2015 1107   ALKPHOS 63 04/11/2015 1107   BILITOT 0.6 04/11/2015 1107   GFRNONAA 31* 04/11/2015 1107   GFRNONAA 47* 02/07/2015 0911   GFRAA 36* 04/11/2015 1107   GFRAA 54* 02/07/2015 0911  ASSESSMENT and THERAPY PLAN:   Anemia and iron deficiency MGUS  She will be due for repeat myeloma survey and we will schedule this for February. I will see her back in the beginning of March with repeats labs and physical exam.  We discussed her BMBX, unfortunately the sampling was poor. I advised her the results are limited. We discussed ongoing observation and if repeat BMBX is needed referral to IR. She is agreeable.  I answered her questions today.  All questions were answered. The patient knows to call the clinic with any problems, questions or concerns. We can certainly see the patient much sooner if necessary.  This note was electronically signed. Molli Hazard, MD  05/30/2015

## 2015-05-29 ENCOUNTER — Ambulatory Visit (HOSPITAL_COMMUNITY): Payer: Commercial Managed Care - HMO | Admitting: Hematology & Oncology

## 2015-05-30 ENCOUNTER — Encounter (HOSPITAL_COMMUNITY): Payer: Commercial Managed Care - HMO | Attending: Hematology & Oncology | Admitting: Hematology & Oncology

## 2015-05-30 ENCOUNTER — Encounter (HOSPITAL_COMMUNITY): Payer: Self-pay | Admitting: Hematology & Oncology

## 2015-05-30 VITALS — BP 129/71 | HR 69 | Temp 97.6°F | Resp 15 | Wt 189.6 lb

## 2015-05-30 DIAGNOSIS — E611 Iron deficiency: Secondary | ICD-10-CM | POA: Diagnosis not present

## 2015-05-30 DIAGNOSIS — D472 Monoclonal gammopathy: Secondary | ICD-10-CM

## 2015-05-30 DIAGNOSIS — D649 Anemia, unspecified: Secondary | ICD-10-CM | POA: Diagnosis not present

## 2015-05-30 DIAGNOSIS — D500 Iron deficiency anemia secondary to blood loss (chronic): Secondary | ICD-10-CM

## 2015-05-30 LAB — TISSUE HYBRIDIZATION (BONE MARROW)-NCBH

## 2015-05-30 LAB — CHROMOSOME ANALYSIS, BONE MARROW

## 2015-05-30 NOTE — Patient Instructions (Signed)
[place] at [facility] Discharge Instructions  RECOMMENDATIONS MADE BY THE CONSULTANT AND ANY TEST RESULTS WILL BE SENT TO YOUR REFERRING PHYSICIAN.  Exam completed by Dr Whitney Muse today Bone scan scheduled Return to see the doctor in 3 months with lab work Please call the clinic if you have any questions or concerns  Thank you for choosing [place] at [facility] to provide your oncology and hematology care.  To afford each patient quality time with our provider, please arrive at least 15 minutes before your scheduled appointment time.    You need to re-schedule your appointment should you arrive 10 or more minutes late.  We strive to give you quality time with our providers, and arriving late affects you and other patients whose appointments are after yours.  Also, if you no show three or more times for appointments you may be dismissed from the clinic at the providers discretion.     Again, thank you for choosing [facility].  Our hope is that these requests will decrease the amount of time that you wait before being seen by our physicians.       _____________________________________________________________  Should you have questions after your visit to [facility], please contact our office at [phone] between the hours of 8:30 a.m. and 4:30 p.m.  Voicemails left after 4:30 p.m. will not be returned until the following business day.  For prescription refill requests, have your pharmacy contact our office.

## 2015-06-09 ENCOUNTER — Other Ambulatory Visit: Payer: Self-pay | Admitting: Family Medicine

## 2015-06-10 ENCOUNTER — Other Ambulatory Visit: Payer: Self-pay | Admitting: Family Medicine

## 2015-06-13 ENCOUNTER — Other Ambulatory Visit: Payer: Self-pay

## 2015-06-13 MED ORDER — AMLODIPINE BESYLATE 10 MG PO TABS: 10.0000 mg | ORAL_TABLET | Freq: Every day | ORAL | Status: DC

## 2015-06-13 MED ORDER — HYDROCHLOROTHIAZIDE 25 MG PO TABS
ORAL_TABLET | ORAL | Status: DC
Start: 2015-06-13 — End: 2016-01-14

## 2015-06-13 MED ORDER — BENAZEPRIL HCL 20 MG PO TABS: 20.0000 mg | ORAL_TABLET | Freq: Every day | ORAL | Status: DC

## 2015-07-08 ENCOUNTER — Encounter (HOSPITAL_COMMUNITY): Payer: Self-pay

## 2015-08-14 ENCOUNTER — Ambulatory Visit: Payer: Commercial Managed Care - HMO | Admitting: Family Medicine

## 2015-09-04 ENCOUNTER — Ambulatory Visit (INDEPENDENT_AMBULATORY_CARE_PROVIDER_SITE_OTHER): Payer: Commercial Managed Care - HMO | Admitting: Family Medicine

## 2015-09-04 ENCOUNTER — Ambulatory Visit (HOSPITAL_COMMUNITY)
Admission: RE | Admit: 2015-09-04 | Discharge: 2015-09-04 | Disposition: A | Discharge status: Home / Self Care | Payer: Commercial Managed Care - HMO | Source: Ambulatory Visit | Attending: Family Medicine | Admitting: Family Medicine

## 2015-09-04 ENCOUNTER — Encounter: Payer: Self-pay | Admitting: Family Medicine

## 2015-09-04 VITALS — BP 128/80 | HR 89 | Resp 16 | Ht 65.0 in | Wt 192.0 lb

## 2015-09-04 DIAGNOSIS — F329 Major depressive disorder, single episode, unspecified: Secondary | ICD-10-CM

## 2015-09-04 DIAGNOSIS — E118 Type 2 diabetes mellitus with unspecified complications: Secondary | ICD-10-CM | POA: Diagnosis not present

## 2015-09-04 DIAGNOSIS — M542 Cervicalgia: Secondary | ICD-10-CM | POA: Diagnosis not present

## 2015-09-04 DIAGNOSIS — E785 Hyperlipidemia, unspecified: Secondary | ICD-10-CM | POA: Diagnosis not present

## 2015-09-04 DIAGNOSIS — M47812 Spondylosis without myelopathy or radiculopathy, cervical region: Secondary | ICD-10-CM | POA: Diagnosis not present

## 2015-09-04 DIAGNOSIS — I10 Essential (primary) hypertension: Secondary | ICD-10-CM | POA: Diagnosis not present

## 2015-09-04 DIAGNOSIS — E038 Other specified hypothyroidism: Secondary | ICD-10-CM

## 2015-09-04 DIAGNOSIS — F32A Depression, unspecified: Secondary | ICD-10-CM

## 2015-09-04 LAB — LIPID PANEL
CHOLESTEROL: 147 mg/dL (ref 125–200)
HDL: 66 mg/dL (ref >=46)
LDL Cholesterol: 69 mg/dL (ref <130)
TRIGLYCERIDES: 61 mg/dL (ref <150)
Total CHOL/HDL Ratio: 2.2 Ratio (ref <=5.0)
VLDL: 12 mg/dL (ref <30)

## 2015-09-04 LAB — COMPLETE METABOLIC PANEL WITH GFR
ALK PHOS: 61 U/L (ref 33–130)
ALT: 20 U/L (ref 6–29)
AST: 21 U/L (ref 10–35)
Albumin: 4 g/dL (ref 3.6–5.1)
BUN: 22 mg/dL (ref 7–25)
CALCIUM: 9.6 mg/dL (ref 8.6–10.4)
CO2: 23 mmol/L (ref 20–31)
Chloride: 105 mmol/L (ref 98–110)
Creat: 1.27 mg/dL — ABNORMAL HIGH (ref 0.60–0.93)
GFR, EST AFRICAN AMERICAN: 47 mL/min — AB (ref >=60)
GFR, Est Non African American: 41 mL/min — ABNORMAL LOW (ref >=60)
GLUCOSE: 101 mg/dL — AB (ref 65–99)
Potassium: 4 mmol/L (ref 3.5–5.3)
Sodium: 138 mmol/L (ref 135–146)
Total Bilirubin: 0.4 mg/dL (ref 0.2–1.2)
Total Protein: 9 g/dL — ABNORMAL HIGH (ref 6.1–8.1)

## 2015-09-04 MED ORDER — PREDNISONE 5 MG (21) PO TBPK: 5.0000 mg | ORAL_TABLET | ORAL | Status: DC

## 2015-09-04 MED ORDER — METHYLPREDNISOLONE ACETATE 80 MG/ML IJ SUSP
80.0000 mg | Freq: Once | INTRAMUSCULAR | Status: AC
  Administered 2015-09-04: 80 mg via INTRAMUSCULAR

## 2015-09-04 MED ORDER — GABAPENTIN 100 MG PO CAPS: 100.0000 mg | ORAL_CAPSULE | Freq: Three times a day (TID) | ORAL | Status: DC

## 2015-09-04 MED ORDER — KETOROLAC TROMETHAMINE 60 MG/2ML IM SOLN
60.0000 mg | Freq: Once | INTRAMUSCULAR | Status: AC
  Administered 2015-09-04: 60 mg via INTRAMUSCULAR

## 2015-09-04 NOTE — Assessment & Plan Note (Signed)
Uncontrolled.Toradol and depo medrol administered IM in the office , to be followed by a short course of oral prednisone and NSAIDS.  

## 2015-09-04 NOTE — Patient Instructions (Signed)
Annual wellness in 4 month, call if you need me sooner  Labs today and also xray of neck  Injections in office today for neck pain, and short course of prednisone for 6 days sent to mail order New for management of pain due to nerve irritation is gabapentin 100mg .  Start one capsule at bedtime, after 3 to 5 days increase , if needed, to two capsules at bedtime, and after an additional 5 days to 3 capsules at bedtime if needed , for pain control.  Please note, the script is written as one three times daily, DO NOT take like this, instead take only at bedtime as above.   Please work on good  health habits so that your health will improve. 1. Commitment to daily physical activity for 30 to 60  minutes, if you are able to do this.  2. Commitment to wise food choices. Aim for half of your  food intake to be vegetable and fruit, one quarter starchy foods, and one quarter protein. Try to eat on a regular schedule  3 meals per day, snacking between meals should be limited to vegetables or fruits or small portions of nuts. 64 ounces of water per day is generally recommended, unless you have specific health conditions, like heart failure or kidney failure where you will need to limit fluid intake.  3. Commitment to sufficient and a  good quality of physical and mental rest daily, generally between 6 to 8 hours per day.  WITH PERSISTANCE AND PERSEVERANCE, THE IMPOSSIBLE , BECOMES THE NORM! Thanks for choosing [facility], we consider it a privelige to serve you.

## 2015-09-04 NOTE — Progress Notes (Signed)
Subjective:    Patient ID: Renee Collins, female    DOB: 10-13-1940, 75 y.o.   MRN: [id number]  HPI   Renee Collins     MRN: [id number]      DOB: March 07, 1941   HPI Renee Collins is here for follow up and re-evaluation of chronic medical conditions, medication management and review of any available recent lab and radiology data.  Preventive health is updated, specifically  Cancer screening and Immunization.   Questions or concerns regarding consultations or procedures which the PT has had in the interim are  addressed. The PT denies any adverse reactions to current medications since the last visit. 2 month h/o right arm pain worse at night, makes sleep uncomfortable and sometimes awakens her, denies weakness or numbness in the upper extremites Has had lumbar spine surgery approx 17 years  ago   ROS Denies recent fever or chills. Denies sinus pressure, nasal congestion, ear pain or sore throat. Denies chest congestion, productive cough or wheezing. Denies chest pains, palpitations and leg swelling Denies abdominal pain, nausea, vomiting,diarrhea or constipation.   Denies dysuria, frequency, hesitancy or incontinence. . Denies headaches, seizures, numbness, or tingling. Denies depression, anxiety or insomnia. Denies skin break down or rash.   PE  BP 128/80 mmHg  Pulse 89  Resp 16  Ht 5\' 5"  (1.651 m)  Wt 192 lb (87.091 kg)  BMI 31.95 kg/m2  SpO2 97%  Patient alert and oriented and in no cardiopulmonary distress.  HEENT: No facial asymmetry, EOMI,   oropharynx pink and moist.  Neck decreased ROM no JVD, no mass.  Chest: Clear to auscultation bilaterally.  CVS: S1, S2 no murmurs, no S3.Regular rate.  ABD: Soft non tender.   Ext: No edema  MS: Adequate ROM spine, shoulders, hips and knees.  Skin: Intact, no ulcerations or rash noted.  Psych: Good eye contact, normal affect. Memory intact not anxious or depressed appearing.  CNS: CN 2-12 intact, power,  normal  throughout.no focal deficits noted.   Assessment & Plan   Neck pain on right side Uncontrolled.Toradol and depo medrol administered IM in the office , to be followed by a short course of oral prednisone and NSAIDS.   Dyslipidemia Hyperlipidemia:Low fat diet discussed and encouraged.   Lipid Panel  Lab Results  Component Value Date   CHOL 147 09/04/2015   HDL 66 09/04/2015   LDLCALC 69 09/04/2015   TRIG 61 09/04/2015   CHOLHDL 2.2 09/04/2015   Controlled, no change in medication      Depression Controlled, no change in medication   Essential hypertension Controlled, no change in medication DASH diet and commitment to daily physical activity for a minimum of 30 minutes discussed and encouraged, as a part of hypertension management. The importance of attaining a healthy weight is also discussed.  BP/Weight 09/04/2015 05/30/2015 05/16/2015 02/12/2015 09/24/2014 09/19/2014 123456  Systolic BP 0000000 [id number] 123456 [id number]  Diastolic BP 80 71 76 74 76 80 80  Wt. (Lbs) 192 189.6 189.6 192.04 196.9 197 204.4  BMI 31.95 31.55 31.55 31.96 32.77 32.78 34.01        Hypothyroidism Controlled, no change in medication   Type 2 diabetes mellitus, controlled Controlled, no change in medication Renee Collins is reminded of the importance of commitment to daily physical activity for 30 minutes or more, as able and the need to limit carbohydrate intake to 30 to 60 grams per meal to help with blood sugar control.   The need  to take medication as prescribed, test blood sugar as directed, and to call between visits if there is a concern that blood sugar is uncontrolled is also discussed.   Renee Collins is reminded of the importance of daily foot exam, annual eye examination, and good blood sugar, blood pressure and cholesterol control.  Diabetic Labs Latest Ref Rng 09/04/2015 04/11/2015 02/07/2015 12/31/2014 09/24/2014  HbA1c <5.7 % 6.7(H) - 6.6(H) - -  Microalbumin <2.0 mg/dL - - 14.6(H) - -    Micro/Creat Ratio 0.0 - 30.0 mg/g - - 54.1(H) - -  Chol 125 - 200 mg/dL 147 - - - -  HDL >=46 mg/dL 66 - - - -  Calc LDL <130 mg/dL 69 - - - -  Triglycerides <150 mg/dL 61 - - - -  Creatinine 0.60 - 0.93 mg/dL 1.27(H) 1.59(H) 1.14(H) 1.38(H) 1.47(H)   BP/Weight 09/04/2015 05/30/2015 05/16/2015 02/12/2015 09/24/2014 09/19/2014 123456  Systolic BP 0000000 [id number] 123456 [id number]  Diastolic BP 80 71 76 74 76 80 80  Wt. (Lbs) 192 189.6 189.6 192.04 196.9 197 204.4  BMI 31.95 31.55 31.55 31.96 32.77 32.78 34.01   Foot/eye exam completion dates Latest Ref Rng 02/12/2015 12/04/2014  Eye Exam No Retinopathy - No Retinopathy  Foot Form Completion - Done -             Review of Systems     Objective:   Physical Exam        Assessment & Plan:

## 2015-09-05 ENCOUNTER — Encounter: Payer: Self-pay | Admitting: Family Medicine

## 2015-09-05 LAB — HEMOGLOBIN A1C
HEMOGLOBIN A1C: 6.7 % — AB (ref <5.7)
MEAN PLASMA GLUCOSE: 146 mg/dL — AB (ref <117)

## 2015-09-05 NOTE — Assessment & Plan Note (Signed)
Controlled, no change in medication DASH diet and commitment to daily physical activity for a minimum of 30 minutes discussed and encouraged, as a part of hypertension management. The importance of attaining a healthy weight is also discussed.  BP/Weight 09/04/2015 05/30/2015 05/16/2015 02/12/2015 09/24/2014 09/19/2014 123456  Systolic BP 0000000 [id number] 123456 [id number]  Diastolic BP 80 71 76 74 76 80 80  Wt. (Lbs) 192 189.6 189.6 192.04 196.9 197 204.4  BMI 31.95 31.55 31.55 31.96 32.77 32.78 34.01

## 2015-09-05 NOTE — Assessment & Plan Note (Signed)
Hyperlipidemia:Low fat diet discussed and encouraged.   Lipid Panel  Lab Results  Component Value Date   CHOL 147 09/04/2015   HDL 66 09/04/2015   LDLCALC 69 09/04/2015   TRIG 61 09/04/2015   CHOLHDL 2.2 09/04/2015   Controlled, no change in medication

## 2015-09-05 NOTE — Assessment & Plan Note (Signed)
Controlled, no change in medication Renee Collins is reminded of the importance of commitment to daily physical activity for 30 minutes or more, as able and the need to limit carbohydrate intake to 30 to 60 grams per meal to help with blood sugar control.   The need to take medication as prescribed, test blood sugar as directed, and to call between visits if there is a concern that blood sugar is uncontrolled is also discussed.   Renee Collins is reminded of the importance of daily foot exam, annual eye examination, and good blood sugar, blood pressure and cholesterol control.  Diabetic Labs Latest Ref Rng 09/04/2015 04/11/2015 02/07/2015 12/31/2014 09/24/2014  HbA1c <5.7 % 6.7(H) - 6.6(H) - -  Microalbumin <2.0 mg/dL - - 14.6(H) - -  Micro/Creat Ratio 0.0 - 30.0 mg/g - - 54.1(H) - -  Chol 125 - 200 mg/dL 147 - - - -  HDL >=46 mg/dL 66 - - - -  Calc LDL <130 mg/dL 69 - - - -  Triglycerides <150 mg/dL 61 - - - -  Creatinine 0.60 - 0.93 mg/dL 1.27(H) 1.59(H) 1.14(H) 1.38(H) 1.47(H)   BP/Weight 09/04/2015 05/30/2015 05/16/2015 02/12/2015 09/24/2014 09/19/2014 123456  Systolic BP 0000000 [id number] 123456 [id number]  Diastolic BP 80 71 76 74 76 80 80  Wt. (Lbs) 192 189.6 189.6 192.04 196.9 197 204.4  BMI 31.95 31.55 31.55 31.96 32.77 32.78 34.01   Foot/eye exam completion dates Latest Ref Rng 02/12/2015 12/04/2014  Eye Exam No Retinopathy - No Retinopathy  Foot Form Completion - Done -

## 2015-09-05 NOTE — Assessment & Plan Note (Signed)
Controlled, no change in medication  

## 2015-09-24 ENCOUNTER — Encounter (HOSPITAL_COMMUNITY): Payer: Self-pay | Admitting: Hematology & Oncology

## 2015-09-24 ENCOUNTER — Ambulatory Visit (HOSPITAL_COMMUNITY)
Admission: RE | Admit: 2015-09-24 | Discharge: 2015-09-24 | Disposition: A | Discharge status: Home / Self Care | Payer: Commercial Managed Care - HMO | Source: Ambulatory Visit | Attending: Hematology & Oncology | Admitting: Hematology & Oncology

## 2015-09-24 ENCOUNTER — Encounter (HOSPITAL_COMMUNITY): Payer: Commercial Managed Care - HMO | Attending: Hematology & Oncology | Admitting: Hematology & Oncology

## 2015-09-24 ENCOUNTER — Encounter (HOSPITAL_COMMUNITY): Payer: Commercial Managed Care - HMO

## 2015-09-24 VITALS — BP 141/66 | HR 86 | Temp 97.6°F | Resp 18 | Wt 191.2 lb

## 2015-09-24 DIAGNOSIS — D472 Monoclonal gammopathy: Secondary | ICD-10-CM

## 2015-09-24 DIAGNOSIS — E785 Hyperlipidemia, unspecified: Secondary | ICD-10-CM | POA: Diagnosis not present

## 2015-09-24 DIAGNOSIS — Z90710 Acquired absence of both cervix and uterus: Secondary | ICD-10-CM | POA: Diagnosis not present

## 2015-09-24 DIAGNOSIS — R630 Anorexia: Secondary | ICD-10-CM

## 2015-09-24 DIAGNOSIS — D631 Anemia in chronic kidney disease: Secondary | ICD-10-CM | POA: Diagnosis not present

## 2015-09-24 DIAGNOSIS — N189 Chronic kidney disease, unspecified: Secondary | ICD-10-CM

## 2015-09-24 DIAGNOSIS — G8929 Other chronic pain: Secondary | ICD-10-CM | POA: Insufficient documentation

## 2015-09-24 DIAGNOSIS — E119 Type 2 diabetes mellitus without complications: Secondary | ICD-10-CM | POA: Diagnosis not present

## 2015-09-24 DIAGNOSIS — Z9889 Other specified postprocedural states: Secondary | ICD-10-CM | POA: Insufficient documentation

## 2015-09-24 DIAGNOSIS — K644 Residual hemorrhoidal skin tags: Secondary | ICD-10-CM | POA: Insufficient documentation

## 2015-09-24 DIAGNOSIS — I129 Hypertensive chronic kidney disease with stage 1 through stage 4 chronic kidney disease, or unspecified chronic kidney disease: Secondary | ICD-10-CM | POA: Insufficient documentation

## 2015-09-24 DIAGNOSIS — D509 Iron deficiency anemia, unspecified: Secondary | ICD-10-CM | POA: Insufficient documentation

## 2015-09-24 DIAGNOSIS — M47892 Other spondylosis, cervical region: Secondary | ICD-10-CM | POA: Diagnosis not present

## 2015-09-24 DIAGNOSIS — D500 Iron deficiency anemia secondary to blood loss (chronic): Secondary | ICD-10-CM

## 2015-09-24 DIAGNOSIS — K219 Gastro-esophageal reflux disease without esophagitis: Secondary | ICD-10-CM | POA: Diagnosis not present

## 2015-09-24 DIAGNOSIS — D649 Anemia, unspecified: Secondary | ICD-10-CM | POA: Diagnosis not present

## 2015-09-24 DIAGNOSIS — Z809 Family history of malignant neoplasm, unspecified: Secondary | ICD-10-CM | POA: Insufficient documentation

## 2015-09-24 DIAGNOSIS — E039 Hypothyroidism, unspecified: Secondary | ICD-10-CM | POA: Insufficient documentation

## 2015-09-24 DIAGNOSIS — F329 Major depressive disorder, single episode, unspecified: Secondary | ICD-10-CM | POA: Insufficient documentation

## 2015-09-24 DIAGNOSIS — N183 Chronic kidney disease, stage 3 unspecified: Secondary | ICD-10-CM

## 2015-09-24 DIAGNOSIS — Z8249 Family history of ischemic heart disease and other diseases of the circulatory system: Secondary | ICD-10-CM | POA: Insufficient documentation

## 2015-09-24 LAB — CBC WITH DIFFERENTIAL/PLATELET
BASOS ABS: 0 10*3/uL (ref 0.0–0.1)
BASOS PCT: 0 %
EOS ABS: 0.2 10*3/uL (ref 0.0–0.7)
Eosinophils Relative: 3 %
HEMATOCRIT: 33.9 % — AB (ref 36.0–46.0)
HEMOGLOBIN: 11.1 g/dL — AB (ref 12.0–15.0)
Lymphocytes Relative: 28 %
Lymphs Abs: 1.6 10*3/uL (ref 0.7–4.0)
MCH: 27.8 pg (ref 26.0–34.0)
MCHC: 32.7 g/dL (ref 30.0–36.0)
MCV: 84.8 fL (ref 78.0–100.0)
MONOS PCT: 9 %
Monocytes Absolute: 0.5 10*3/uL (ref 0.1–1.0)
NEUTROS ABS: 3.4 10*3/uL (ref 1.7–7.7)
NEUTROS PCT: 60 %
Platelets: 252 10*3/uL (ref 150–400)
RBC: 4 MIL/uL (ref 3.87–5.11)
RDW: 13.4 % (ref 11.5–15.5)
WBC: 5.6 10*3/uL (ref 4.0–10.5)

## 2015-09-24 LAB — FERRITIN: Ferritin: 156 ng/mL (ref 11–307)

## 2015-09-24 LAB — IRON AND TIBC
Iron: 74 ug/dL (ref 28–170)
Saturation Ratios: 24 % (ref 10.4–31.8)
TIBC: 311 ug/dL (ref 250–450)
UIBC: 237 ug/dL

## 2015-09-24 NOTE — Patient Instructions (Addendum)
[place] at [facility] Discharge Instructions  RECOMMENDATIONS MADE BY THE CONSULTANT AND ANY TEST RESULTS WILL BE SENT TO YOUR REFERRING PHYSICIAN.   Exam and discussion by Dr Whitney Muse today Blood work is stable, some labs are pending we will call you with those results  Go by and do your bone survey, no appt needed just stop by radiaology, anytime in the next two weeks Try zinc to help your taste, zinc 50 mg three times a day Return to see the doctor in 4 months with labs  Please call the clinic if you have any questions or concerns    Thank you for choosing [place] at [facility] to provide your oncology and hematology care.  To afford each patient quality time with our provider, please arrive at least 15 minutes before your scheduled appointment time.   Beginning January 23rd 2017 lab work for the [facility] will be done in the  Main lab at [facility] on 1st floor. If you have a lab appointment with the [facility] please come in thru the  Main Entrance and check in at the main information desk  You need to re-schedule your appointment should you arrive 10 or more minutes late.  We strive to give you quality time with our providers, and arriving late affects you and other patients whose appointments are after yours.  Also, if you no show three or more times for appointments you may be dismissed from the clinic at the providers discretion.     Again, thank you for choosing [facility].  Our hope is that these requests will decrease the amount of time that you wait before being seen by our physicians.       _____________________________________________________________  Should you have questions after your visit to [facility], please contact our office at [phone] between the hours of 8:30 a.m. and 4:30 p.m.  Voicemails left after 4:30 p.m. will not be returned until the following business day.  For  prescription refill requests, have your pharmacy contact our office.         Resources For Cancer Patients and their Caregivers ? American Cancer Society: Can assist with transportation, wigs, general needs, runs Look Good Feel Better.        279-470-6072 ? Cancer Care: Provides financial assistance, online support groups, medication/co-pay assistance.  1-800-813-HOPE (725)733-1103) ? [place] Assists [place] cancer patients and their families through emotional , educational and financial support.  530-164-3305 ? [place] DSS Where to apply for food stamps, Medicaid and utility assistance. 708-120-4841 ? [facility]: Transportation to medical appointments. 507-795-0816 ? Social Security Administration: May apply for disability if have a Stage IV cancer. 514-219-8461 559-135-7981 ? [employer], Disability and Transit Services: Assists with nutrition, care and transit needs. (480)806-4482

## 2015-09-24 NOTE — Progress Notes (Signed)
Renee Nakayama, MD [address], [address] [place] [address]   DIAGNOSIS:   Anemia CKD Iron deficiency MGUS, monoclonal IgG kappa Colonoscopy 03/30/2012 with external/internal hemorrhoids, small polyps and scattered Diverticula Bone survey 08/13/2013 with no findings suggestive of myeloma Bone Marrow 04/2015 poor sampling  CURRENT THERAPY: Observation  INTERVAL HISTORY: Renee Collins 75 y.o. female returns for follow-up of anemia and MGUS. She has no major complaints today. She reports she is doing very well.  Renee Collins was here alone today.  She said that she feels good.  Her appetite is fair. Food doesn't taste like food anymore but she is still eating. She notes this is a chronic problem.   She had blood work done today and this was stable.  She denies any changes in her bowels or hematochezia.  She is up to date on her mammograms  She is due for a bone survey; her last one was March of last year.  She will see Dr. Moshe Cipro on 10/05/15.  MEDICAL HISTORY: Past Medical History  Diagnosis Date  . Hypertension   . Depression   . Chronic back pain   . Anemia   . Hypothyroidism   . Diabetes mellitus   . Hyperlipidemia   . GERD (gastroesophageal reflux disease)     has THYROID NODULE; Hypothyroidism; Dyslipidemia; Anemia in chronic renal disease; Depression; Essential hypertension; KNEE, ARTHRITIS, DEGEN./OSTEO; BAKER'S CYST, LEFT KNEE; Type 2 diabetes mellitus, controlled ([place]); Seasonal allergies; CKD (chronic kidney disease) stage 3, GFR 30-59 ml/min; Cataracts, bilateral; MGUS (monoclonal gammopathy of unknown significance); Iron deficiency anemia due to chronic blood loss; Onychomycosis; Osteopenia; OA (osteoarthritis) of knee; Effusion of knee joint, left; and Neck pain on right side on her problem list.     has No Known Allergies.  Renee Collins had no medications administered during this visit.  SURGICAL HISTORY: Past Surgical History  Procedure  Laterality Date  . Laminectomy  1980's  . Total abdominal hysterectomy  approx 1993    fibroids   . Spine surgery  approx 1983    dr Joya Salm  . Colonoscopy  03/30/2012    Procedure: COLONOSCOPY;  Surgeon: Rogene Houston, MD;  Location: [facility];  Service: Endoscopy;  Laterality: N/A;  730  . Bone marrow aspiration Left 05/16/15  . Bone marrow biopsy Left 05/16/15    SOCIAL HISTORY: Social History   Social History  . Marital Status: Divorced    Spouse Name: N/A  . Number of Children: 3  . Years of Education: N/A   Occupational History  . LPN    Social History Main Topics  . Smoking status: Never Smoker   . Smokeless tobacco: Never Used  . Alcohol Use: No  . Drug Use: No  . Sexual Activity: Not Currently   Other Topics Concern  . Not on file   Social History Narrative   Pt has 2 living adult children, 1 desease at age 37 secondary congential heart disease     FAMILY HISTORY: Family History  Problem Relation Age of Onset  . Heart attack Mother 33  . Heart disease Mother   . Cancer Mother     type unknown  . Diabetes Mother   . Kidney failure Father   . Kidney disease Father   . Myasthenia gravis Brother   . Cancer Sister 33    breast  . Diabetes Sister   . Heart disease Brother     Review of Systems  Constitutional: Negative  for fever, chills, weight loss and malaise/fatigue.       Appetite is fair. Food doesn't taste like it used to but is still eating.  HENT: Negative for congestion, hearing loss, nosebleeds, sore throat and tinnitus.   Eyes: Negative for blurred vision, double vision, pain and discharge.  Respiratory: Negative for cough, hemoptysis, sputum production, shortness of breath and wheezing.   Cardiovascular: Negative for chest pain, palpitations, claudication, leg swelling and PND.  Gastrointestinal: Negative for heartburn, nausea, vomiting, abdominal pain, diarrhea, constipation, blood in stool and melena.  Genitourinary: Negative for  dysuria, urgency, frequency and hematuria.  Musculoskeletal: Negative for myalgias, joint pain and falls.  Skin: Negative for itching and rash.  Neurological: Negative for dizziness, tingling, tremors, sensory change, speech change, focal weakness, seizures, loss of consciousness, weakness and headaches.  Endo/Heme/Allergies: Does not bruise/bleed easily.  Psychiatric/Behavioral: Negative for depression, suicidal ideas, memory loss and substance abuse. The patient is not nervous/anxious and does not have insomnia.   14 point review of systems was performed and is negative except as detailed under history of present illness and above   PHYSICAL EXAMINATION  ECOG PERFORMANCE STATUS: 0 - Asymptomatic  Filed Vitals:   09/24/15 1124  BP: 141/66  Pulse: 86  Temp: 97.6 F (36.4 C)  Resp: 18    Physical Exam  Constitutional: She is oriented to person, place, and time and well-developed, well-nourished, and in no distress.  HENT:  Head: Normocephalic and atraumatic.  Nose: Nose normal.  Mouth/Throat: Oropharynx is clear and moist. No oropharyngeal exudate.  Eyes: Conjunctivae and EOM are normal. Pupils are equal, round, and reactive to light. Right eye exhibits no discharge. Left eye exhibits no discharge. No scleral icterus.  Neck: Normal range of motion. Neck supple. No tracheal deviation present. No thyromegaly present.  Cardiovascular: Normal rate, regular rhythm and normal heart sounds.  Exam reveals no gallop and no friction rub.   No murmur heard. Pulmonary/Chest: Effort normal and breath sounds normal. She has no wheezes. She has no rales.  Abdominal: Soft. Bowel sounds are normal. She exhibits no distension and no mass. There is no tenderness. There is no rebound and no guarding.  Musculoskeletal: Normal range of motion. She exhibits no edema.  Lymphadenopathy:    She has no cervical adenopathy.  Neurological: She is alert and oriented to person, place, and time. She has normal  reflexes. No cranial nerve deficit. Gait normal. Coordination normal.  Skin: Skin is warm and dry. No rash noted.  Psychiatric: Mood, memory, affect and judgment normal.  Nursing note and vitals reviewed.   LABORATORY DATA: I have reviewed the data as listed. CBC    Component Value Date/Time   WBC 5.6 09/24/2015 1105   RBC 4.00 09/24/2015 1105   RBC 4.40 08/13/2013 1410   HGB 11.1* 09/24/2015 1105   HCT 33.9* 09/24/2015 1105   PLT 252 09/24/2015 1105   MCV 84.8 09/24/2015 1105   MCH 27.8 09/24/2015 1105   MCHC 32.7 09/24/2015 1105   RDW 13.4 09/24/2015 1105   LYMPHSABS 1.6 09/24/2015 1105   MONOABS 0.5 09/24/2015 1105   EOSABS 0.2 09/24/2015 1105   BASOSABS 0.0 09/24/2015 1105   CMP     Component Value Date/Time   NA 138 09/04/2015 1130   K 4.0 09/04/2015 1130   CL 105 09/04/2015 1130   CO2 23 09/04/2015 1130   GLUCOSE 101* 09/04/2015 1130   BUN 22 09/04/2015 1130   CREATININE 1.27* 09/04/2015 1130   CREATININE 1.59* 04/11/2015  1107   CALCIUM 9.6 09/04/2015 1130   PROT 9.0* 09/04/2015 1130   ALBUMIN 4.0 09/04/2015 1130   AST 21 09/04/2015 1130   ALT 20 09/04/2015 1130   ALKPHOS 61 09/04/2015 1130   BILITOT 0.4 09/04/2015 1130   GFRNONAA 41* 09/04/2015 1130   GFRNONAA 31* 04/11/2015 1107   GFRAA 47* 09/04/2015 1130   GFRAA 36* 04/11/2015 1107   ASSESSMENT and THERAPY PLAN:   Anemia and iron deficiency MGUS CKD, stage III  CBC and CMP are stable. We will notify her of the remainder of her lab results when available. BMBX, unfortunately had poor sampling. I advised her the results are limited. We discussed ongoing observation and if repeat BMBX is needed referral to IR. She is agreeable.   She is up to date on her mammography and other well care.  She is due for a bone survey; her last one was March of last year.  She will see Dr. Moshe Cipro on 10/05/15. She will return to [place] in 4 months for ongoing observation.   All questions were answered. The patient knows  to call the clinic with any problems, questions or concerns. We can certainly see the patient much sooner if necessary.  This document serves as a record of services personally performed by Ancil Linsey, MD. It was created on her behalf by Kandace Blitz, a trained medical scribe. The creation of this record is based on the scribe's personal observations and the provider's statements to them. This document has been checked and approved by the attending provider.  I have reviewed the above documentation for accuracy and completeness, and I agree with the above.  This note was electronically signed. Molli Hazard, MD  09/24/2015

## 2015-09-25 LAB — PROTEIN ELECTROPHORESIS, SERUM
A/G Ratio: 0.8 (ref 0.7–1.7)
ALPHA-2-GLOBULIN: 1 g/dL (ref 0.4–1.0)
Albumin ELP: 3.7 g/dL (ref 2.9–4.4)
Alpha-1-Globulin: 0.2 g/dL (ref 0.0–0.4)
BETA GLOBULIN: 1.1 g/dL (ref 0.7–1.3)
GAMMA GLOBULIN: 2.2 g/dL — AB (ref 0.4–1.8)
Globulin, Total: 4.5 g/dL — ABNORMAL HIGH (ref 2.2–3.9)
M-SPIKE, %: 1.9 g/dL — AB
Total Protein ELP: 8.2 g/dL (ref 6.0–8.5)

## 2015-09-25 LAB — IGG, IGA, IGM
IGA: 87 mg/dL (ref 64–422)
IGG (IMMUNOGLOBIN G), SERUM: 2405 mg/dL — AB (ref 700–1600)
IGM, SERUM: 58 mg/dL (ref 26–217)

## 2015-09-29 LAB — IMMUNOFIXATION ELECTROPHORESIS
IgA: 89 mg/dL (ref 64–422)
IgG (Immunoglobin G), Serum: 2625 mg/dL — ABNORMAL HIGH (ref 700–1600)
IgM, Serum: 57 mg/dL (ref 26–217)
TOTAL PROTEIN ELP: 8.3 g/dL (ref 6.0–8.5)

## 2016-01-14 ENCOUNTER — Encounter: Payer: Self-pay | Admitting: Family Medicine

## 2016-01-14 ENCOUNTER — Ambulatory Visit (INDEPENDENT_AMBULATORY_CARE_PROVIDER_SITE_OTHER): Payer: Commercial Managed Care - HMO | Admitting: Family Medicine

## 2016-01-14 VITALS — BP 124/74 | HR 74 | Resp 16 | Ht 65.0 in | Wt 194.0 lb

## 2016-01-14 DIAGNOSIS — E038 Other specified hypothyroidism: Secondary | ICD-10-CM | POA: Diagnosis not present

## 2016-01-14 DIAGNOSIS — E785 Hyperlipidemia, unspecified: Secondary | ICD-10-CM | POA: Diagnosis not present

## 2016-01-14 DIAGNOSIS — Z13820 Encounter for screening for osteoporosis: Secondary | ICD-10-CM

## 2016-01-14 DIAGNOSIS — Z0000 Encounter for general adult medical examination without abnormal findings: Secondary | ICD-10-CM | POA: Insufficient documentation

## 2016-01-14 DIAGNOSIS — M542 Cervicalgia: Secondary | ICD-10-CM

## 2016-01-14 DIAGNOSIS — E559 Vitamin D deficiency, unspecified: Secondary | ICD-10-CM

## 2016-01-14 DIAGNOSIS — E118 Type 2 diabetes mellitus with unspecified complications: Secondary | ICD-10-CM

## 2016-01-14 DIAGNOSIS — M8580 Other specified disorders of bone density and structure, unspecified site: Secondary | ICD-10-CM

## 2016-01-14 DIAGNOSIS — I10 Essential (primary) hypertension: Secondary | ICD-10-CM

## 2016-01-14 MED ORDER — LEVOTHYROXINE SODIUM 50 MCG PO TABS: ORAL_TABLET | ORAL | Status: DC

## 2016-01-14 MED ORDER — FLUOXETINE HCL 10 MG PO TABS: 10.0000 mg | ORAL_TABLET | Freq: Every day | ORAL | Status: DC

## 2016-01-14 MED ORDER — AMLODIPINE BESYLATE 10 MG PO TABS: 10.0000 mg | ORAL_TABLET | Freq: Every day | ORAL | Status: DC

## 2016-01-14 MED ORDER — HYDROCHLOROTHIAZIDE 25 MG PO TABS: ORAL_TABLET | ORAL | Status: DC

## 2016-01-14 MED ORDER — BENAZEPRIL HCL 20 MG PO TABS: 20.0000 mg | ORAL_TABLET | Freq: Every day | ORAL | Status: DC

## 2016-01-14 MED ORDER — GABAPENTIN 100 MG PO CAPS: 100.0000 mg | ORAL_CAPSULE | Freq: Three times a day (TID) | ORAL | Status: DC

## 2016-01-14 NOTE — Progress Notes (Signed)
Subjective:    Patient ID: Renee Collins, female    DOB: 29-Sep-1940, 75 y.o.   MRN: [id number]  HPI Preventive Screening-Counseling & Management   Patient present here today for a Medicare annual wellness visit.   Current Problems (verified)   Medications Prior to Visit Allergies (verified)   PAST HISTORY  Family History (verified)   Social History Divorced, with 2 children, retired from [employer]. Son lives with her. Never smoked    Risk Factors  Current exercise habits:  Tries to walk a few days a week when its not too hot   Dietary issues discussed: heart healthy, variety of fruits and vegetables, limits fried foods and carbs    Cardiac risk factors: Mother had MI at age 71. Type 2 DM   Depression Screen  (Note: if answer to either of the following is "Yes", a more complete depression screening is indicated)   Over the past two weeks, have you felt down, depressed or hopeless? No  Over the past two weeks, have you felt little interest or pleasure in doing things? No  Have you lost interest or pleasure in daily life? No  Do you often feel hopeless? No  Do you cry easily over simple problems? No   Activities of Daily Living  In your present state of health, do you have any difficulty performing the following activities?  Driving?: No Managing money?: No Feeding yourself?:No Getting from bed to chair?:No Climbing a flight of stairs?:No Preparing food and eating?:No Bathing or showering?:No Getting dressed?:No Getting to the toilet?:No Using the toilet?:No Moving around from place to place?: No  Fall Risk Assessment In the past year have you fallen or had a near fall?:No Are you currently taking any medications that make you dizzy?:No   Hearing Difficulties: No Do you often ask people to speak up or repeat themselves?:No Do you experience ringing or noises in your ears?:No Do you have difficulty understanding soft or whispered voices?:No  Cognitive Testing    Alert? Yes Normal Appearance?Yes  Oriented to person? Yes Place? Yes  Time? Yes  Displays appropriate judgment?Yes  Can read the correct time from a watch face? yes Are you having problems remembering things?No  Advanced Directives have been discussed with the patient?Yes, pt has advanced directives , written, son is health care power of attorney, Renee Collins   List the Names of Other Physician/Practitioners you currently use: Updated   Indicate any recent Medical Services you may have received from other than [facility] providers in the past year (date may be approximate).     Medicare Attestation  I have personally reviewed:  The patient's medical and social history  Their use of alcohol, tobacco or illicit drugs  Their current medications and supplements  The patient's functional ability including ADLs,fall risks, home safety risks, cognitive, and hearing and visual impairment  Diet and physical activities  Evidence for depression or mood disorders  The patient's weight, height, BMI, and visual acuity have been recorded in the chart. I have made referrals, counseling, and provided education to the patient based on review of the above and I have provided the patient with a written personalized care plan for preventive services.    Physical Exam BP 124/74 mmHg  Pulse 74  Resp 16  Ht 5\' 5"  (1.651 m)  Wt 194 lb (87.998 kg)  BMI 32.28 kg/m2  SpO2 99%   Assessment & Plan:     Review of Systems     Objective:   Physical  Exam        Assessment & Plan:

## 2016-01-14 NOTE — Assessment & Plan Note (Signed)

## 2016-01-14 NOTE — Patient Instructions (Signed)
Annual physical exam in September, call if you need me sooner  Fasting lipid, cmp and EGFr, HBA1C, TSH, vit D  in  Sept   You are referred for bone density test and eye exam   Please work on good  health habits so that your health will improve. 1. Commitment to daily physical activity for 30 to 60  minutes, if you are able to do this.  2. Commitment to wise food choices. Aim for half of your  food intake to be vegetable and fruit, one quarter starchy foods, and one quarter protein. Try to eat on a regular schedule  3 meals per day, snacking between meals should be limited to vegetables or fruits or small portions of nuts. 64 ounces of water per day is generally recommended, unless you have specific health conditions, like heart failure or kidney failure where you will need to limit fluid intake.  3. Commitment to sufficient and a  good quality of physical and mental rest daily, generally between 6 to 8 hours per day.  WITH PERSISTANCE AND PERSEVERANCE, THE IMPOSSIBLE , BECOMES THE NORM!   Thank you  for choosing [facility]. We consider it a privelige to serve you.  Delivering excellent health care in a caring and  compassionate way is our goal.  Partnering with you,  so that together we can achieve this goal is our strategy.

## 2016-01-26 ENCOUNTER — Encounter (HOSPITAL_COMMUNITY): Payer: Self-pay | Admitting: Hematology & Oncology

## 2016-01-26 ENCOUNTER — Encounter (HOSPITAL_COMMUNITY): Payer: Commercial Managed Care - HMO | Attending: Hematology & Oncology | Admitting: Hematology & Oncology

## 2016-01-26 ENCOUNTER — Encounter (HOSPITAL_COMMUNITY): Payer: Commercial Managed Care - HMO

## 2016-01-26 VITALS — BP 146/80 | HR 75 | Temp 97.8°F | Resp 18 | Wt 194.6 lb

## 2016-01-26 DIAGNOSIS — N183 Chronic kidney disease, stage 3 (moderate): Secondary | ICD-10-CM

## 2016-01-26 DIAGNOSIS — Z833 Family history of diabetes mellitus: Secondary | ICD-10-CM | POA: Diagnosis not present

## 2016-01-26 DIAGNOSIS — D649 Anemia, unspecified: Secondary | ICD-10-CM | POA: Insufficient documentation

## 2016-01-26 DIAGNOSIS — D500 Iron deficiency anemia secondary to blood loss (chronic): Secondary | ICD-10-CM

## 2016-01-26 DIAGNOSIS — D631 Anemia in chronic kidney disease: Secondary | ICD-10-CM

## 2016-01-26 DIAGNOSIS — Z8249 Family history of ischemic heart disease and other diseases of the circulatory system: Secondary | ICD-10-CM | POA: Insufficient documentation

## 2016-01-26 DIAGNOSIS — Z809 Family history of malignant neoplasm, unspecified: Secondary | ICD-10-CM | POA: Diagnosis not present

## 2016-01-26 DIAGNOSIS — Z9889 Other specified postprocedural states: Secondary | ICD-10-CM | POA: Insufficient documentation

## 2016-01-26 DIAGNOSIS — N189 Chronic kidney disease, unspecified: Secondary | ICD-10-CM

## 2016-01-26 DIAGNOSIS — D509 Iron deficiency anemia, unspecified: Secondary | ICD-10-CM | POA: Diagnosis not present

## 2016-01-26 DIAGNOSIS — Z841 Family history of disorders of kidney and ureter: Secondary | ICD-10-CM | POA: Insufficient documentation

## 2016-01-26 DIAGNOSIS — Z803 Family history of malignant neoplasm of breast: Secondary | ICD-10-CM | POA: Diagnosis not present

## 2016-01-26 DIAGNOSIS — D472 Monoclonal gammopathy: Secondary | ICD-10-CM | POA: Diagnosis not present

## 2016-01-26 LAB — CBC WITH DIFFERENTIAL/PLATELET
BASOS ABS: 0 10*3/uL (ref 0.0–0.1)
BASOS PCT: 1 %
Eosinophils Absolute: 0.2 10*3/uL (ref 0.0–0.7)
Eosinophils Relative: 5 %
HEMATOCRIT: 34.2 % — AB (ref 36.0–46.0)
Hemoglobin: 11 g/dL — ABNORMAL LOW (ref 12.0–15.0)
LYMPHS PCT: 31 %
Lymphs Abs: 1.3 10*3/uL (ref 0.7–4.0)
MCH: 27 pg (ref 26.0–34.0)
MCHC: 32.2 g/dL (ref 30.0–36.0)
MCV: 84 fL (ref 78.0–100.0)
MONO ABS: 0.4 10*3/uL (ref 0.1–1.0)
Monocytes Relative: 9 %
NEUTROS ABS: 2.3 10*3/uL (ref 1.7–7.7)
Neutrophils Relative %: 54 %
PLATELETS: 188 10*3/uL (ref 150–400)
RBC: 4.07 MIL/uL (ref 3.87–5.11)
RDW: 13 % (ref 11.5–15.5)
WBC: 4.3 10*3/uL (ref 4.0–10.5)

## 2016-01-26 LAB — IRON AND TIBC
IRON: 71 ug/dL (ref 28–170)
Saturation Ratios: 23 % (ref 10.4–31.8)
TIBC: 311 ug/dL (ref 250–450)
UIBC: 240 ug/dL

## 2016-01-26 LAB — FERRITIN: Ferritin: 126 ng/mL (ref 11–307)

## 2016-01-26 NOTE — Progress Notes (Signed)
Renee Nakayama, MD [address], [address] [place] [address]   DIAGNOSIS:   Anemia CKD Iron deficiency MGUS, monoclonal IgG kappa Colonoscopy 03/30/2012 with external/internal hemorrhoids, small polyps and scattered Diverticula Bone survey 08/13/2013 with no findings suggestive of myeloma Bone Marrow 04/2015 poor sampling  CURRENT THERAPY: Observation  INTERVAL HISTORY: Renee Collins 75 y.o. female returns for follow-up of anemia and MGUS. She has no major complaints today. She reports she is doing very well.  Ms. Vanscyoc is unaccompanied. I personally reviewed and went over laboratory studies with the patient.  She is doing well. She is eating and sleeping well. She has no complaints or concerns at this time.  She denies chest pain or breathing problems. No new pain.  She is up to date on her mammogram. She remains active.   MEDICAL HISTORY: Past Medical History:  Diagnosis Date  . Anemia   . Chronic back pain   . Depression   . Diabetes mellitus   . GERD (gastroesophageal reflux disease)   . Hyperlipidemia   . Hypertension   . Hypothyroidism     has THYROID NODULE; Hypothyroidism; Dyslipidemia; Anemia in chronic renal disease; Depression; Essential hypertension; KNEE, ARTHRITIS, DEGEN./OSTEO; Type 2 diabetes mellitus, controlled ([place]); Seasonal allergies; CKD (chronic kidney disease) stage 3, GFR 30-59 ml/min; Cataracts, bilateral; MGUS (monoclonal gammopathy of unknown significance); Iron deficiency anemia due to chronic blood loss; OA (osteoarthritis) of knee; Osteopenia; and Medicare annual wellness visit, subsequent on her problem list.     has No Known Allergies.  Ms. Harnden had no medications administered during this visit.  SURGICAL HISTORY: Past Surgical History:  Procedure Laterality Date  . BONE MARROW ASPIRATION Left 05/16/15  . BONE MARROW BIOPSY Left 05/16/15  . COLONOSCOPY  03/30/2012   Procedure: COLONOSCOPY;  Surgeon: Rogene Houston, MD;   Location[facility];  Service: Endoscopy;  Laterality: N/A;  730  . LAMINECTOMY  1980's  . SPINE SURGERY  approx 1983   dr Joya Salm  . TOTAL ABDOMINAL HYSTERECTOMY  approx 1993   fibroids     SOCIAL HISTORY: Social History   Social History  . Marital status: Divorced    Spouse name: N/A  . Number of children: 3  . Years of education: N/A   Occupational History  . LPN    Social History Main Topics  . Smoking status: Never Smoker  . Smokeless tobacco: Never Used  . Alcohol use No  . Drug use: No  . Sexual activity: Not Currently   Other Topics Concern  . Not on file   Social History Narrative   Pt has 2 living adult children, 1 desease at age 26 secondary congential heart disease     FAMILY HISTORY: Family History  Problem Relation Age of Onset  . Heart attack Mother 97  . Heart disease Mother   . Cancer Mother     type unknown  . Diabetes Mother   . Kidney failure Father   . Kidney disease Father   . Myasthenia gravis Brother   . Cancer Sister 10    breast  . Diabetes Sister   . Heart disease Brother   . Diabetes Sister     Review of Systems  Constitutional: Negative for chills, fever, malaise/fatigue and weight loss.  HENT: Negative for congestion, hearing loss, nosebleeds, sore throat and tinnitus.   Eyes: Negative for blurred vision, double vision, pain and discharge.  Respiratory: Negative for cough, hemoptysis, sputum production, shortness of  breath and wheezing.   Cardiovascular: Negative for chest pain, palpitations, claudication, leg swelling and PND.  Gastrointestinal: Negative for abdominal pain, blood in stool, constipation, diarrhea, heartburn, melena, nausea and vomiting.  Genitourinary: Negative for dysuria, frequency, hematuria and urgency.  Musculoskeletal: Negative for falls, joint pain and myalgias.  Skin: Negative for itching and rash.  Neurological: Negative for dizziness, tingling, tremors, sensory change, speech change, focal  weakness, seizures, loss of consciousness, weakness and headaches.  Endo/Heme/Allergies: Does not bruise/bleed easily.  Psychiatric/Behavioral: Negative for depression, memory loss, substance abuse and suicidal ideas. The patient is not nervous/anxious and does not have insomnia.   14 point review of systems was performed and is negative except as detailed under history of present illness and above   PHYSICAL EXAMINATION  ECOG PERFORMANCE STATUS: 0 - Asymptomatic  Vitals:   01/26/16 1000  BP: (!) 146/80  Pulse: 75  Resp: 18  Temp: 97.8 F (36.6 C)    Physical Exam  Constitutional: She is oriented to person, place, and time and well-developed, well-nourished, and in no distress.  HENT:  Head: Normocephalic and atraumatic.  Nose: Nose normal.  Mouth/Throat: Oropharynx is clear and moist. No oropharyngeal exudate.  Eyes: Conjunctivae and EOM are normal. Pupils are equal, round, and reactive to light. Right eye exhibits no discharge. Left eye exhibits no discharge. No scleral icterus.  Neck: Normal range of motion. Neck supple. No tracheal deviation present. No thyromegaly present.  Cardiovascular: Normal rate, regular rhythm and normal heart sounds.  Exam reveals no gallop and no friction rub.   No murmur heard. Pulmonary/Chest: Effort normal and breath sounds normal. She has no wheezes. She has no rales.  Abdominal: Soft. Bowel sounds are normal. She exhibits no distension and no mass. There is no tenderness. There is no rebound and no guarding.  Musculoskeletal: Normal range of motion. She exhibits no edema.  Lymphadenopathy:    She has no cervical adenopathy.  Neurological: She is alert and oriented to person, place, and time. She has normal reflexes. No cranial nerve deficit. Gait normal. Coordination normal.  Skin: Skin is warm and dry. No rash noted.  Psychiatric: Mood, memory, affect and judgment normal.  Nursing note and vitals reviewed.   LABORATORY DATA: I have  reviewed the data as listed. CBC    Component Value Date/Time   WBC 4.3 01/26/2016 1031   RBC 4.07 01/26/2016 1031   HGB 11.0 (L) 01/26/2016 1031   HCT 34.2 (L) 01/26/2016 1031   PLT 188 01/26/2016 1031   MCV 84.0 01/26/2016 1031   MCH 27.0 01/26/2016 1031   MCHC 32.2 01/26/2016 1031   RDW 13.0 01/26/2016 1031   LYMPHSABS 1.3 01/26/2016 1031   MONOABS 0.4 01/26/2016 1031   EOSABS 0.2 01/26/2016 1031   BASOSABS 0.0 01/26/2016 1031   CMP     Component Value Date/Time   NA 138 09/04/2015 1130   K 4.0 09/04/2015 1130   CL 105 09/04/2015 1130   CO2 23 09/04/2015 1130   GLUCOSE 101 (H) 09/04/2015 1130   BUN 22 09/04/2015 1130   CREATININE 1.27 (H) 09/04/2015 1130   CALCIUM 9.6 09/04/2015 1130   PROT 9.0 (H) 09/04/2015 1130   ALBUMIN 4.0 09/04/2015 1130   AST 21 09/04/2015 1130   ALT 20 09/04/2015 1130   ALKPHOS 61 09/04/2015 1130   BILITOT 0.4 09/04/2015 1130   GFRNONAA 41 (L) 09/04/2015 1130   GFRAA 47 (L) 09/04/2015 1130   RADIOGRAPHIC STUDIES: I have personally reviewed the radiological images  as listed and agreed with the findings in the report. Study Result   CLINICAL DATA:  Monoclonal gammopathy of unknown significance.  EXAM: METASTATIC BONE SURVEY  COMPARISON:  Prior examination 08/13/2013.  FINDINGS: The bones are mildly demineralized. No lytic lesions or pathologic fractures are identified. There is multilevel cervical spondylosis with disc space loss and osteophyte formation. There are lesser degenerative changes within the thoracolumbar spine which appear unchanged. Disc space loss is most advanced at L4-5 where there is a grade 1 anterolisthesis. There is a small amount of calcification lateral to the right humeral head suggesting hydroxyapatite deposition, not significantly changed. There are stable degenerative changes at both knees. Moderate-size hiatal hernia again noted.  IMPRESSION: Stable skeletal survey demonstrating no focal lytic  lesion or pathologic fracture. Stable degenerative changes, most pronounced in the cervical spine.   Electronically Signed   By: Richardean Sale M.D.   On: 09/24/2015 13:44    ASSESSMENT and THERAPY PLAN:   Anemia and iron deficiency MGUS CKD, stage III  CBC is stable. We will notify her of the remainder of her lab results when available. BMBX, unfortunately had poor sampling. I advised her the results are limited. We discussed ongoing observation and if repeat BMBX is needed referral to IR. She is agreeable.   She is up to date on her mammography and other well care. Her next mammogram will be due in November 2017.   She is up to date on her Myeloma survey.   Last saw Dr. Moshe Cipro on 01/14/16. She is scheduled to follow up with Dr. Moshe Cipro on 03/31/16.  She will return for follow up in 4 months with standing labs.  All questions were answered. The patient knows to call the clinic with any problems, questions or concerns. We can certainly see the patient much sooner if necessary.  This document serves as a record of services personally performed by Ancil Linsey, MD. It was created on her behalf by Arlyce Harman, a trained medical scribe. The creation of this record is based on the scribe's personal observations and the provider's statements to them. This document has been checked and approved by the attending provider.  I have reviewed the above documentation for accuracy and completeness, and I agree with the above.  This note was electronically signed. Molli Hazard, MD  01/26/2016

## 2016-01-26 NOTE — Patient Instructions (Signed)
[facility] at [facility] Discharge Instructions  RECOMMENDATIONS MADE BY THE CONSULTANT AND ANY TEST RESULTS WILL BE SENT TO YOUR REFERRING PHYSICIAN.  Exam done and seen today by Dr. Penland Labs in 4 months Return to see the doctor in 4 months Please call the clinic if you have any questions or concerns  Thank you for choosing [facility] at [facility] to provide your oncology and hematology care.  To afford each patient quality time with our provider, please arrive at least 15 minutes before your scheduled appointment time.   Beginning January 23rd 2017 lab work for the Cancer Center will be done in the  Main lab at [facility] on 1st floor. If you have a lab appointment with the Cancer Center please come in thru the  Main Entrance and check in at the main information desk  You need to re-schedule your appointment should you arrive 10 or more minutes late.  We strive to give you quality time with our providers, and arriving late affects you and other patients whose appointments are after yours.  Also, if you no show three or more times for appointments you may be dismissed from the clinic at the providers discretion.     Again, thank you for choosing [facility].  Our hope is that these requests will decrease the amount of time that you wait before being seen by our physicians.       _____________________________________________________________  Should you have questions after your visit to [facility], please contact our office at [phone] between the hours of 8:30 a.m. and 4:30 p.m.  Voicemails left after 4:30 p.m. will not be returned until the following business day.  For prescription refill requests, have your pharmacy contact our office.         Resources For Cancer Patients and their Caregivers ? American Cancer Society: Can assist with transportation, wigs, general needs, runs Look Good Feel Better.         1-888-227-6333 ? Cancer Care: Provides financial assistance, online support groups, medication/co-pay assistance.  1-800-813-HOPE (4673) ? [facility] Assists [place] cancer patients and their families through emotional , educational and financial support.  336-427-4357 ? [place] DSS Where to apply for food stamps, Medicaid and utility assistance. 336-342-1394 ? RCATS: Transportation to medical appointments. 336-347-2287 ? Social Security Administration: May apply for disability if have a Stage IV cancer. 336-342-7796 1-800-772-1213 ? [place] Aging, Disability and Transit Services: Assists with nutrition, care and transit needs. 336-349-2343  Cancer Center Support Programs: @10RELATIVEDAYS@ > Cancer Support Group  2nd Tuesday of the month 1pm-2pm, Journey Room  > Creative Journey  3rd Tuesday of the month 1130am-1pm, Journey Room  > Look Good Feel Better  1st Wednesday of the month 10am-12 noon, Journey Room (Call American Cancer Society to register 1-800-395-5775)   

## 2016-01-27 LAB — IGG, IGA, IGM
IgA: 69 mg/dL (ref 64–422)
IgG (Immunoglobin G), Serum: 2983 mg/dL — ABNORMAL HIGH (ref 700–1600)
IgM, Serum: 59 mg/dL (ref 26–217)

## 2016-01-27 LAB — PROTEIN ELECTROPHORESIS, SERUM
A/G Ratio: 0.8 (ref 0.7–1.7)
ALPHA-1-GLOBULIN: 0.3 g/dL (ref 0.0–0.4)
ALPHA-2-GLOBULIN: 1 g/dL (ref 0.4–1.0)
Albumin ELP: 3.8 g/dL (ref 2.9–4.4)
Beta Globulin: 1.1 g/dL (ref 0.7–1.3)
GAMMA GLOBULIN: 2.4 g/dL — AB (ref 0.4–1.8)
Globulin, Total: 4.8 g/dL — ABNORMAL HIGH (ref 2.2–3.9)
M-SPIKE, %: 2.1 g/dL — AB
TOTAL PROTEIN ELP: 8.6 g/dL — AB (ref 6.0–8.5)

## 2016-01-27 LAB — IMMUNOFIXATION ELECTROPHORESIS
IGA: 73 mg/dL (ref 64–422)
IGG (IMMUNOGLOBIN G), SERUM: 3021 mg/dL — AB (ref 700–1600)
IGM, SERUM: 63 mg/dL (ref 26–217)
Total Protein ELP: 8.3 g/dL (ref 6.0–8.5)

## 2016-02-04 DIAGNOSIS — H2513 Age-related nuclear cataract, bilateral: Secondary | ICD-10-CM | POA: Diagnosis not present

## 2016-02-04 DIAGNOSIS — E119 Type 2 diabetes mellitus without complications: Secondary | ICD-10-CM | POA: Diagnosis not present

## 2016-02-04 LAB — HM DIABETES EYE EXAM

## 2016-03-22 DIAGNOSIS — I10 Essential (primary) hypertension: Secondary | ICD-10-CM | POA: Diagnosis not present

## 2016-03-22 DIAGNOSIS — E118 Type 2 diabetes mellitus with unspecified complications: Secondary | ICD-10-CM | POA: Diagnosis not present

## 2016-03-22 DIAGNOSIS — E559 Vitamin D deficiency, unspecified: Secondary | ICD-10-CM | POA: Diagnosis not present

## 2016-03-22 DIAGNOSIS — Z0100 Encounter for examination of eyes and vision without abnormal findings: Secondary | ICD-10-CM | POA: Diagnosis not present

## 2016-03-22 DIAGNOSIS — E785 Hyperlipidemia, unspecified: Secondary | ICD-10-CM | POA: Diagnosis not present

## 2016-03-23 LAB — COMPLETE METABOLIC PANEL WITH GFR
ALK PHOS: 53 U/L (ref 33–130)
ALT: 18 U/L (ref 6–29)
AST: 24 U/L (ref 10–35)
Albumin: 3.9 g/dL (ref 3.6–5.1)
BUN: 16 mg/dL (ref 7–25)
CO2: 24 mmol/L (ref 20–31)
Calcium: 9.5 mg/dL (ref 8.6–10.4)
Chloride: 106 mmol/L (ref 98–110)
Creat: 1.28 mg/dL — ABNORMAL HIGH (ref 0.60–0.93)
GFR, EST AFRICAN AMERICAN: 47 mL/min — AB (ref >=60)
GFR, EST NON AFRICAN AMERICAN: 41 mL/min — AB (ref >=60)
GLUCOSE: 101 mg/dL — AB (ref 65–99)
POTASSIUM: 3.8 mmol/L (ref 3.5–5.3)
SODIUM: 138 mmol/L (ref 135–146)
Total Bilirubin: 0.4 mg/dL (ref 0.2–1.2)
Total Protein: 8.2 g/dL — ABNORMAL HIGH (ref 6.1–8.1)

## 2016-03-23 LAB — LIPID PANEL
Cholesterol: 183 mg/dL (ref 125–200)
HDL: 62 mg/dL (ref >=46)
LDL CALC: 107 mg/dL (ref <130)
Total CHOL/HDL Ratio: 3 Ratio (ref <=5.0)
Triglycerides: 70 mg/dL (ref <150)
VLDL: 14 mg/dL (ref <30)

## 2016-03-23 LAB — TSH: TSH: 2.15 mIU/L

## 2016-03-23 LAB — HEMOGLOBIN A1C
Hgb A1c MFr Bld: 6.2 % — ABNORMAL HIGH (ref <5.7)
MEAN PLASMA GLUCOSE: 131 mg/dL

## 2016-03-23 LAB — VITAMIN D 25 HYDROXY (VIT D DEFICIENCY, FRACTURES): VIT D 25 HYDROXY: 35 ng/mL (ref 30–100)

## 2016-03-25 ENCOUNTER — Other Ambulatory Visit: Payer: Self-pay | Admitting: Family Medicine

## 2016-03-31 ENCOUNTER — Encounter: Payer: Commercial Managed Care - HMO | Admitting: Family Medicine

## 2016-04-07 ENCOUNTER — Other Ambulatory Visit: Payer: Self-pay | Admitting: Pharmacist

## 2016-04-07 NOTE — Patient Outreach (Signed)
Outreach call to Renee Collins regarding her request for follow up from the [facility] Medication Adherence Campaign. Left a HIPAA compliant message on the patient's voicemail.   Harlow Asa, PharmD Clinical Pharmacist [facility] [phone]

## 2016-04-15 ENCOUNTER — Encounter: Payer: Commercial Managed Care - HMO | Admitting: Family Medicine

## 2016-04-26 ENCOUNTER — Other Ambulatory Visit: Payer: Self-pay | Admitting: Family Medicine

## 2016-04-26 DIAGNOSIS — Z1231 Encounter for screening mammogram for malignant neoplasm of breast: Secondary | ICD-10-CM

## 2016-05-12 ENCOUNTER — Ambulatory Visit (HOSPITAL_COMMUNITY)
Admission: RE | Admit: 2016-05-12 | Discharge: 2016-05-12 | Disposition: A | Discharge status: Home / Self Care | Payer: Commercial Managed Care - HMO | Source: Ambulatory Visit | Attending: Family Medicine | Admitting: Family Medicine

## 2016-05-12 DIAGNOSIS — Z1231 Encounter for screening mammogram for malignant neoplasm of breast: Secondary | ICD-10-CM | POA: Diagnosis not present

## 2016-05-28 ENCOUNTER — Encounter (HOSPITAL_COMMUNITY): Payer: Commercial Managed Care - HMO | Attending: Hematology & Oncology | Admitting: Hematology & Oncology

## 2016-05-28 ENCOUNTER — Encounter (HOSPITAL_COMMUNITY): Payer: Self-pay | Admitting: Hematology & Oncology

## 2016-05-28 ENCOUNTER — Encounter (HOSPITAL_COMMUNITY): Payer: Commercial Managed Care - HMO

## 2016-05-28 VITALS — BP 147/68 | HR 70 | Temp 98.6°F | Resp 16 | Wt 196.5 lb

## 2016-05-28 DIAGNOSIS — D472 Monoclonal gammopathy: Secondary | ICD-10-CM

## 2016-05-28 DIAGNOSIS — D631 Anemia in chronic kidney disease: Secondary | ICD-10-CM | POA: Insufficient documentation

## 2016-05-28 DIAGNOSIS — D649 Anemia, unspecified: Secondary | ICD-10-CM

## 2016-05-28 DIAGNOSIS — E611 Iron deficiency: Secondary | ICD-10-CM

## 2016-05-28 DIAGNOSIS — N189 Chronic kidney disease, unspecified: Secondary | ICD-10-CM | POA: Diagnosis not present

## 2016-05-28 DIAGNOSIS — N183 Chronic kidney disease, stage 3 unspecified: Secondary | ICD-10-CM

## 2016-05-28 DIAGNOSIS — Z0000 Encounter for general adult medical examination without abnormal findings: Secondary | ICD-10-CM | POA: Insufficient documentation

## 2016-05-28 DIAGNOSIS — K6289 Other specified diseases of anus and rectum: Secondary | ICD-10-CM

## 2016-05-28 DIAGNOSIS — R198 Other specified symptoms and signs involving the digestive system and abdomen: Secondary | ICD-10-CM

## 2016-05-28 DIAGNOSIS — Z23 Encounter for immunization: Secondary | ICD-10-CM

## 2016-05-28 LAB — COMPREHENSIVE METABOLIC PANEL
ALK PHOS: 55 U/L (ref 38–126)
ALT: 24 U/L (ref 14–54)
AST: 29 U/L (ref 15–41)
Albumin: 3.9 g/dL (ref 3.5–5.0)
Anion gap: 6 (ref 5–15)
BILIRUBIN TOTAL: 0.5 mg/dL (ref 0.3–1.2)
BUN: 21 mg/dL — ABNORMAL HIGH (ref 6–20)
CALCIUM: 9.3 mg/dL (ref 8.9–10.3)
CO2: 23 mmol/L (ref 22–32)
CREATININE: 1.33 mg/dL — AB (ref 0.44–1.00)
Chloride: 106 mmol/L (ref 101–111)
GFR calc non Af Amer: 38 mL/min — ABNORMAL LOW (ref >60)
GFR, EST AFRICAN AMERICAN: 44 mL/min — AB (ref >60)
GLUCOSE: 100 mg/dL — AB (ref 65–99)
Potassium: 3.6 mmol/L (ref 3.5–5.1)
SODIUM: 135 mmol/L (ref 135–145)
Total Protein: 8.9 g/dL — ABNORMAL HIGH (ref 6.5–8.1)

## 2016-05-28 LAB — CBC WITH DIFFERENTIAL/PLATELET
Basophils Absolute: 0 10*3/uL (ref 0.0–0.1)
Basophils Relative: 0 %
EOS ABS: 0.2 10*3/uL (ref 0.0–0.7)
Eosinophils Relative: 5 %
HEMATOCRIT: 34.5 % — AB (ref 36.0–46.0)
HEMOGLOBIN: 11.1 g/dL — AB (ref 12.0–15.0)
LYMPHS ABS: 1.8 10*3/uL (ref 0.7–4.0)
LYMPHS PCT: 37 %
MCH: 27.3 pg (ref 26.0–34.0)
MCHC: 32.2 g/dL (ref 30.0–36.0)
MCV: 84.8 fL (ref 78.0–100.0)
Monocytes Absolute: 0.3 10*3/uL (ref 0.1–1.0)
Monocytes Relative: 7 %
NEUTROS ABS: 2.6 10*3/uL (ref 1.7–7.7)
NEUTROS PCT: 51 %
Platelets: 210 10*3/uL (ref 150–400)
RBC: 4.07 MIL/uL (ref 3.87–5.11)
RDW: 13.2 % (ref 11.5–15.5)
WBC: 5 10*3/uL (ref 4.0–10.5)

## 2016-05-28 MED ORDER — INFLUENZA VAC SPLIT QUAD 0.5 ML IM SUSY
PREFILLED_SYRINGE | INTRAMUSCULAR | Status: AC
  Filled 2016-05-28: qty 0.5

## 2016-05-28 MED ORDER — INFLUENZA VAC SPLIT QUAD 0.5 ML IM SUSY
0.5000 mL | PREFILLED_SYRINGE | Freq: Once | INTRAMUSCULAR | Status: AC
  Administered 2016-05-28: 0.5 mL via INTRAMUSCULAR

## 2016-05-28 NOTE — Progress Notes (Signed)
Flu vaccine administered IM in RT deltoid.  Pt tolerated without any problems.

## 2016-05-28 NOTE — Progress Notes (Signed)
Renee Nakayama, MD [address], [address] [place] [address]  PROGRESS NOTE  DIAGNOSIS:   Anemia CKD Iron deficiency MGUS, monoclonal IgG kappa Colonoscopy 03/30/2012 with external/internal hemorrhoids, small polyps and scattered Diverticula Bone survey 08/13/2013 with no findings suggestive of myeloma Bone Marrow 04/2015 poor sampling  CURRENT THERAPY: Observation  INTERVAL HISTORY: Renee Collins 75 y.o. female returns for follow-up of anemia and MGUS. She has no major complaints today. She reports she is doing very well.  Ms. Helminiak is unaccompanied. I personally reviewed the labs with the patient.   She is feeling good today.   Her appetite is good. She gained weight over Thanksgiving. Her energy level is the same.   She has some constipation. She has been taking a stool softener to help with this. She is experiencing rectal discharge. It is watery. She has to wear a pad. It has been getting worse. She can not tell when it is about to happen. No history of bowel disease. This started before the constipation.   She is up to date on her colonoscopy and mammogram.   She has some shoulder pain. She was told it is arthritis. She describes the pain as burning and it comes and goes.   Denies chest pain, abdominal pain, or diarrhea.   She has not had her flu shot yet.   MEDICAL HISTORY: Past Medical History:  Diagnosis Date  . Anemia   . Chronic back pain   . Depression   . Diabetes mellitus   . GERD (gastroesophageal reflux disease)   . Hyperlipidemia   . Hypertension   . Hypothyroidism     has THYROID NODULE; Hypothyroidism; Dyslipidemia; Anemia in chronic renal disease; Depression; Essential hypertension; KNEE, ARTHRITIS, DEGEN./OSTEO; Type 2 diabetes mellitus, controlled ([place]); Seasonal allergies; CKD (chronic kidney disease) stage 3, GFR 30-59 ml/min; Cataracts, bilateral; MGUS (monoclonal gammopathy of unknown significance); Iron deficiency anemia due to  chronic blood loss; OA (osteoarthritis) of knee; Osteopenia; and Medicare annual wellness visit, subsequent on her problem list.     has No Known Allergies.  Ms. Gillham had no medications administered during this visit.  SURGICAL HISTORY: Past Surgical History:  Procedure Laterality Date  . BONE MARROW ASPIRATION Left 05/16/15  . BONE MARROW BIOPSY Left 05/16/15  . COLONOSCOPY  03/30/2012   Procedure: COLONOSCOPY;  Surgeon: Rogene Houston, MD;  Location: [facility];  Service: Endoscopy;  Laterality: N/A;  730  . LAMINECTOMY  1980's  . SPINE SURGERY  approx 1983   dr Joya Salm  . TOTAL ABDOMINAL HYSTERECTOMY  approx 1993   fibroids     SOCIAL HISTORY: Social History   Social History  . Marital status: Divorced    Spouse name: N/A  . Number of children: 3  . Years of education: N/A   Occupational History  . LPN    Social History Main Topics  . Smoking status: Never Smoker  . Smokeless tobacco: Never Used  . Alcohol use No  . Drug use: No  . Sexual activity: Not Currently   Other Topics Concern  . Not on file   Social History Narrative   Pt has 2 living adult children, 1 desease at age 77 secondary congential heart disease     FAMILY HISTORY: Family History  Problem Relation Age of Onset  . Heart attack Mother 38  . Heart disease Mother   . Cancer Mother     type unknown  . Diabetes Mother   .  Kidney failure Father   . Kidney disease Father   . Myasthenia gravis Brother   . Cancer Sister 56    breast  . Diabetes Sister   . Heart disease Brother   . Diabetes Sister     Review of Systems  Constitutional: Negative.        Good appetite.   HENT: Negative.   Eyes: Negative.   Respiratory: Negative.   Cardiovascular: Negative.  Negative for chest pain.  Gastrointestinal: Positive for constipation. Negative for abdominal pain and diarrhea.       Rectal discharge (watery)  Genitourinary: Negative.   Musculoskeletal: Positive for joint pain (shoulder -  arthritis).  Skin: Negative.   Neurological: Negative.   Endo/Heme/Allergies: Negative.   Psychiatric/Behavioral: Negative.   All other systems reviewed and are negative. 14 point review of systems was performed and is negative except as detailed under history of present illness and above   PHYSICAL EXAMINATION  ECOG PERFORMANCE STATUS: 1 - Symptomatic but completely ambulatory  Vitals:   05/28/16 1154  BP: (!) 147/68  Pulse: 70  Resp: 16  Temp: 98.6 F (37 C)     Physical Exam  Constitutional: She is oriented to person, place, and time and well-developed, well-nourished, and in no distress.  Pt was able to get on exam table without assistance.   HENT:  Head: Normocephalic and atraumatic.  Nose: Nose normal.  Mouth/Throat: Oropharynx is clear and moist. No oropharyngeal exudate.  Eyes: Conjunctivae and EOM are normal. Pupils are equal, round, and reactive to light. Right eye exhibits no discharge. Left eye exhibits no discharge. No scleral icterus.  Neck: Normal range of motion. Neck supple. No tracheal deviation present. No thyromegaly present.  Cardiovascular: Normal rate, regular rhythm and normal heart sounds.  Exam reveals no gallop and no friction rub.   No murmur heard. Pulmonary/Chest: Effort normal and breath sounds normal. She has no wheezes. She has no rales.  Abdominal: Soft. Bowel sounds are normal. She exhibits no distension and no mass. There is no tenderness. There is no rebound and no guarding.  Musculoskeletal: Normal range of motion. She exhibits no edema.  Lymphadenopathy:    She has no cervical adenopathy.  Neurological: She is alert and oriented to person, place, and time. She has normal reflexes. No cranial nerve deficit. Gait normal. Coordination normal.  Skin: Skin is warm and dry. No rash noted.  Psychiatric: Mood, memory, affect and judgment normal.  Nursing note and vitals reviewed.   LABORATORY DATA: I have reviewed the data as listed. CBC     Component Value Date/Time   WBC 5.0 05/28/2016 1053   RBC 4.07 05/28/2016 1053   HGB 11.1 (L) 05/28/2016 1053   HCT 34.5 (L) 05/28/2016 1053   PLT 210 05/28/2016 1053   MCV 84.8 05/28/2016 1053   MCH 27.3 05/28/2016 1053   MCHC 32.2 05/28/2016 1053   RDW 13.2 05/28/2016 1053   LYMPHSABS 1.8 05/28/2016 1053   MONOABS 0.3 05/28/2016 1053   EOSABS 0.2 05/28/2016 1053   BASOSABS 0.0 05/28/2016 1053   CMP     Component Value Date/Time   NA 135 05/28/2016 1053   K 3.6 05/28/2016 1053   CL 106 05/28/2016 1053   CO2 23 05/28/2016 1053   GLUCOSE 100 (H) 05/28/2016 1053   BUN 21 (H) 05/28/2016 1053   CREATININE 1.33 (H) 05/28/2016 1053   CREATININE 1.28 (H) 03/22/2016 1007   CALCIUM 9.3 05/28/2016 1053   PROT 8.9 (H) 05/28/2016 1053  ALBUMIN 3.9 05/28/2016 1053   AST 29 05/28/2016 1053   ALT 24 05/28/2016 1053   ALKPHOS 55 05/28/2016 1053   BILITOT 0.5 05/28/2016 1053   GFRNONAA 38 (L) 05/28/2016 1053   GFRNONAA 41 (L) 03/22/2016 1007   GFRAA 44 (L) 05/28/2016 1053   GFRAA 47 (L) 03/22/2016 1007         RADIOGRAPHIC STUDIES: I have personally reviewed the radiological images as listed and agreed with the findings in the report. Study Result   CLINICAL DATA:  Screening.  EXAM: 2D DIGITAL SCREENING BILATERAL MAMMOGRAM WITH CAD AND ADJUNCT TOMO  COMPARISON:  Previous exam(s).  ACR Breast Density Category b: There are scattered areas of fibroglandular density.  FINDINGS: There are no findings suspicious for malignancy. Images were processed with CAD.  IMPRESSION: No mammographic evidence of malignancy. A result letter of this screening mammogram will be mailed directly to the patient.  RECOMMENDATION: Screening mammogram in one year. (Code:SM-B-01Y)  BI-RADS CATEGORY  1: Negative.   Electronically Signed   By: Margarette Canada M.D.   On: 05/14/2016 09:24    CLINICAL DATA:  Monoclonal gammopathy of unknown significance.  EXAM: METASTATIC BONE  SURVEY  COMPARISON:  Prior examination 08/13/2013.  FINDINGS: The bones are mildly demineralized. No lytic lesions or pathologic fractures are identified. There is multilevel cervical spondylosis with disc space loss and osteophyte formation. There are lesser degenerative changes within the thoracolumbar spine which appear unchanged. Disc space loss is most advanced at L4-5 where there is a grade 1 anterolisthesis. There is a small amount of calcification lateral to the right humeral head suggesting hydroxyapatite deposition, not significantly changed. There are stable degenerative changes at both knees. Moderate-size hiatal hernia again noted.  IMPRESSION: Stable skeletal survey demonstrating no focal lytic lesion or pathologic fracture. Stable degenerative changes, most pronounced in the cervical spine.   Electronically Signed   By: Richardean Sale M.D.   On: 09/24/2015 13:44   ASSESSMENT and THERAPY PLAN:   Anemia and iron deficiency MGUS CKD, stage III Rectal discharge  CBC is stable. We will notify her of the remainder of her lab results when available. BMBX, unfortunately had poor sampling. I advised her the results are limited. We discussed ongoing observation and if repeat BMBX is needed referral to IR. She is agreeable.   She is up to date on her mammography and other well care. Her next mammogram will be due in November 2018.   She is up to date on her Myeloma survey.   I am going to refer her to GI for her rectal complaints, she follows with Dr. Laural Golden.   She will get a flu shot today.   She will return for follow up in 4 months with standing labs.  All questions were answered. The patient knows to call the clinic with any problems, questions or concerns. We can certainly see the patient much sooner if necessary.  This document serves as a record of services personally performed by Ancil Linsey, MD. It was created on her behalf by Martinique Casey, a  trained medical scribe. The creation of this record is based on the scribe's personal observations and the provider's statements to them. This document has been checked and approved by the attending provider.  I have reviewed the above documentation for accuracy and completeness, and I agree with the above.  This note was electronically signed. Molli Hazard, MD  05/28/2016

## 2016-05-28 NOTE — Patient Instructions (Addendum)
[facility] Discharge Instructions  RECOMMENDATIONS MADE BY THE CONSULTANT AND ANY TEST RESULTS WILL BE SENT TO YOUR REFERRING PHYSICIAN.  We want to get you back to your GI doctor to see what is going on with the rectal discharge.   Today you received the flu vaccine. Your arm may be sore for 2-3 days.   Return to see Dr. Whitney Muse in 4 months with labs   Thank you for choosing [facility] at [facility] to provide your oncology and hematology care.  To afford each patient quality time with our provider, please arrive at least 15 minutes before your scheduled appointment time.   Beginning January 23rd 2017 lab work for the [facility] will be done in the  Main lab at [facility] on 1st floor. If you have a lab appointment with the [facility] please come in thru the  Main Entrance and check in at the main information desk  You need to re-schedule your appointment should you arrive 10 or more minutes late.  We strive to give you quality time with our providers, and arriving late affects you and other patients whose appointments are after yours.  Also, if you no show three or more times for appointments you may be dismissed from the clinic at the providers discretion.     Again, thank you for choosing [facility].  Our hope is that these requests will decrease the amount of time that you wait before being seen by our physicians.       _____________________________________________________________  Should you have questions after your visit to [facility], please contact our office at [phone] between the hours of 8:30 a.m. and 4:30 p.m.  Voicemails left after 4:30 p.m. will not be returned until the following business day.  For prescription refill requests, have your pharmacy contact our office.         Resources For Cancer Patients and their Caregivers ? American Cancer Society: Can assist with  transportation, wigs, general needs, runs Look Good Feel Better.        534-243-9241 ? Cancer Care: Provides financial assistance, online support groups, medication/co-pay assistance.  1-800-813-HOPE 320-326-5985) ? [facility] Assists [place] cancer patients and their families through emotional , educational and financial support.  763-486-2090 ? [place] DSS Where to apply for food stamps, Medicaid and utility assistance. 202 396 3641 ? RCATS: Transportation to medical appointments. 617 174 5318 ? Social Security Administration: May apply for disability if have a Stage IV cancer. 343-720-2855 409-131-8417 ? [employer], Disability and Transit Services: Assists with nutrition, care and transit needs. [place] Support Programs: @10RELATIVEDAYS @ > Cancer Support Group  2nd Tuesday of the month 1pm-2pm, Journey Room  > Creative Journey  3rd Tuesday of the month 1130am-1pm, Journey Room  > Look Good Feel Better  1st Wednesday of the month 10am-12 noon, Journey Room (Call [facility] to register [phone])

## 2016-05-31 LAB — KAPPA/LAMBDA LIGHT CHAINS
KAPPA FREE LGHT CHN: 141.2 mg/L — AB (ref 3.3–19.4)
Kappa, lambda light chain ratio: 9.35 — ABNORMAL HIGH (ref 0.26–1.65)
Lambda free light chains: 15.1 mg/L (ref 5.7–26.3)

## 2016-06-01 LAB — MULTIPLE MYELOMA PANEL, SERUM
ALBUMIN SERPL ELPH-MCNC: 3.9 g/dL (ref 2.9–4.4)
Albumin/Glob SerPl: 0.9 (ref 0.7–1.7)
Alpha 1: 0.3 g/dL (ref 0.0–0.4)
Alpha2 Glob SerPl Elph-Mcnc: 0.9 g/dL (ref 0.4–1.0)
B-GLOBULIN SERPL ELPH-MCNC: 1.1 g/dL (ref 0.7–1.3)
Gamma Glob SerPl Elph-Mcnc: 2.5 g/dL — ABNORMAL HIGH (ref 0.4–1.8)
Globulin, Total: 4.8 g/dL — ABNORMAL HIGH (ref 2.2–3.9)
IGA: 78 mg/dL (ref 64–422)
IGM, SERUM: 63 mg/dL (ref 26–217)
IgG (Immunoglobin G), Serum: 3165 mg/dL — ABNORMAL HIGH (ref 700–1600)
M Protein SerPl Elph-Mcnc: 2.2 g/dL — ABNORMAL HIGH
TOTAL PROTEIN ELP: 8.7 g/dL — AB (ref 6.0–8.5)

## 2016-06-03 ENCOUNTER — Encounter (INDEPENDENT_AMBULATORY_CARE_PROVIDER_SITE_OTHER): Payer: Self-pay | Admitting: Internal Medicine

## 2016-06-07 ENCOUNTER — Ambulatory Visit (INDEPENDENT_AMBULATORY_CARE_PROVIDER_SITE_OTHER): Payer: Commercial Managed Care - HMO | Admitting: Family Medicine

## 2016-06-07 ENCOUNTER — Encounter: Payer: Self-pay | Admitting: Family Medicine

## 2016-06-07 VITALS — BP 122/82 | HR 94 | Resp 16 | Ht 65.0 in | Wt 195.0 lb

## 2016-06-07 DIAGNOSIS — E559 Vitamin D deficiency, unspecified: Secondary | ICD-10-CM

## 2016-06-07 DIAGNOSIS — E118 Type 2 diabetes mellitus with unspecified complications: Secondary | ICD-10-CM

## 2016-06-07 DIAGNOSIS — Z1211 Encounter for screening for malignant neoplasm of colon: Secondary | ICD-10-CM

## 2016-06-07 DIAGNOSIS — D500 Iron deficiency anemia secondary to blood loss (chronic): Secondary | ICD-10-CM

## 2016-06-07 DIAGNOSIS — E785 Hyperlipidemia, unspecified: Secondary | ICD-10-CM

## 2016-06-07 DIAGNOSIS — E1169 Type 2 diabetes mellitus with other specified complication: Secondary | ICD-10-CM

## 2016-06-07 DIAGNOSIS — E669 Obesity, unspecified: Secondary | ICD-10-CM

## 2016-06-07 DIAGNOSIS — E038 Other specified hypothyroidism: Secondary | ICD-10-CM

## 2016-06-07 DIAGNOSIS — Z0000 Encounter for general adult medical examination without abnormal findings: Secondary | ICD-10-CM | POA: Diagnosis not present

## 2016-06-07 LAB — HEMOCCULT GUIAC POC 1CARD (OFFICE): Fecal Occult Blood, POC: NEGATIVE

## 2016-06-07 NOTE — Patient Instructions (Addendum)
F/u in 5 month, call if you need me before  Pls get appt for bone density test at checkout if possible, past due  Non fasting chem 7 and eGFr, hBA1C, tSH and vit D leve;l, cbc ,irin and ferritin levels 2nd week in January  ONLY tylenol for pain, need to protect kidneys.   Please work on good  health habits so that your health will improve. 1. Commitment to daily physical activity for 30 to 60  minutes, if you are able to do this.  2. Commitment to wise food choices. Aim for half of your  food intake to be vegetable and fruit, one quarter starchy foods, and one quarter protein. Try to eat on a regular schedule  3 meals per day, snacking between meals should be limited to vegetables or fruits or small portions of nuts. 64 ounces of water per day is generally recommended, unless you have specific health conditions, like heart failure or kidney failure where you will need to limit fluid intake.  3. Commitment to sufficient and a  good quality of physical and mental rest daily, generally between 6 to 8 hours per day.  WITH PERSISTANCE AND PERSEVERANCE, THE IMPOSSIBLE , BECOMES THE NORM!  It is important that you exercise regularly at least 30 minutes 5 times a week. If you develop chest pain, have severe difficulty breathing, or feel very tired, stop exercising immediately and seek medical attention

## 2016-06-07 NOTE — Assessment & Plan Note (Signed)
Controlled, no change in management Updated lab needed at/ before next visit.   

## 2016-06-07 NOTE — Assessment & Plan Note (Signed)
Annual exam as documented. Counseling done  re healthy lifestyle involving commitment to 150 minutes exercise per week, heart healthy diet, and attaining healthy weight.The importance of adequate sleep also discussed. Rm. Immunization and cancer screening needs are specifically addressed at this visit.'

## 2016-06-07 NOTE — Addendum Note (Signed)
Addended by: Denman George B on: 06/07/2016 03:50 PM   Modules accepted: Orders

## 2016-06-07 NOTE — Addendum Note (Signed)
Addended by: Denman George B on: 06/07/2016 03:40 PM   Modules accepted: Orders

## 2016-06-07 NOTE — Progress Notes (Signed)
    Renee Collins     MRN: [id number]      DOB: 01-01-1941  HPI: Patient is in for annual physical exam. No other health concerns are expressed or addressed at the visit. Recent labs, if available are reviewed. Immunization is reviewed , and  updated if needed.   PE: BP 122/82   Pulse 94   Resp 16   Ht 5\' 5"  (1.651 m)   Wt 195 lb (88.5 kg)   SpO2 96%   BMI 32.45 kg/m  Pleasant  female, alert and oriented x 3, in no cardio-pulmonary distress. Afebrile. HEENT No facial trauma or asymetry. Sinuses non tender.  Extra occullar muscles intact, pupils equally reactive to light. External ears normal, tympanic membranes clear. Oropharynx moist, no exudate. Neck: supple, no adenopathy,JVD or thyromegaly.No bruits.  Chest: Clear to ascultation bilaterally.No crackles or wheezes. Non tender to palpation  Breast: No asymetry,no masses or lumps. No tenderness. No nipple discharge or inversion. No axillary or supraclavicular adenopathy  Cardiovascular system; Heart sounds normal,  S1 and  S2 ,no S3.  No murmur, or thrill. Apical beat not displaced Peripheral pulses normal.  Abdomen: Soft, non tender, no organomegaly or masses. No bruits. Bowel sounds normal. No guarding, tenderness or rebound.  Rectal:  Normal sphincter tone.  rectal mass unchanged,. Prolapsed hemmorhoids Guaiac negative stool.  GU: External genitalia normal female genitalia , normal female distribution of hair. No lesions. Urethral meatus normal in size, no  Prolapse, no lesions visibly  Present. Bladder non tender. Vagina pink and moist , with no visible lesions , discharge present . Adequate pelvic support no  cystocele or rectocele noted  Uterus absent , no adnexal masses, no  adnexal tenderness.   Musculoskeletal exam: Full ROM of spine, hips , shoulders and knees. No deformity ,swelling or crepitus noted. No muscle wasting or atrophy.   Neurologic: Cranial nerves 2 to 12 intact. Power, tone  ,sensation and reflexes normal throughout. No disturbance in gait. No tremor.  Skin: Intact, no ulceration, erythema , scaling or rash noted. Pigmentation normal throughout  Psych; Normal mood and affect. Judgement and concentration normal   Assessment & Plan:  Annual physical exam Annual exam as documented. Counseling done  re healthy lifestyle involving commitment to 150 minutes exercise per week, heart healthy diet, and attaining healthy weight.The importance of adequate sleep also discussed. Rm. Immunization and cancer screening needs are specifically addressed at this visit.'  Type 2 diabetes mellitus, controlled Controlled, no change in management Updated lab needed at/ before next visit.

## 2016-06-10 ENCOUNTER — Ambulatory Visit (INDEPENDENT_AMBULATORY_CARE_PROVIDER_SITE_OTHER): Payer: Commercial Managed Care - HMO | Admitting: Internal Medicine

## 2016-06-10 ENCOUNTER — Encounter (INDEPENDENT_AMBULATORY_CARE_PROVIDER_SITE_OTHER): Payer: Self-pay | Admitting: Internal Medicine

## 2016-06-10 VITALS — BP 142/58 | HR 64 | Temp 97.9°F | Ht 65.0 in | Wt 196.9 lb

## 2016-06-10 DIAGNOSIS — R198 Other specified symptoms and signs involving the digestive system and abdomen: Secondary | ICD-10-CM

## 2016-06-10 DIAGNOSIS — K623 Rectal prolapse: Secondary | ICD-10-CM | POA: Diagnosis not present

## 2016-06-10 NOTE — Patient Instructions (Addendum)
  Referral to Dr. Leighton Ruff.

## 2016-06-10 NOTE — Progress Notes (Signed)
Subjective:    Patient ID: Renee Collins, female    DOB: [birth year], 75 y.o.   MRN: [id number]  HPI Referred by Dr. Myriam Jacobson for rectal discharge. 06/07/2016 She was guaiac negative with Dr. Moshe Cipro. She tells me she has been having some discharge from her rectum. She says the discharge is clear and sometimes it has a foul odor to it. Discharge for greater than 6 months. She says she wears a pad due to this.  She describes as a teaspoon daily. No bleeding. Her stools are formed but are not hard since starting the stool softener. Her discharge is better since starting the stool softener. Appetite is good. No weight loss.   She says she wears a pad.  03/30/2012 Colonoscopy: screening, Dr. Laural Golden.  Impression:  Examination performed to cecum. Mild sigmoid colon diverticulosis. Small polyp ablated via cold biopsy from sigmoid colon. Melanosis coli. External and internal hemorrhoids.  Polyp hyperplastic. Results given to patient. She may consider other exam in 10 years if she continues to enjoy good health   CBC    Component Value Date/Time   WBC 5.0 05/28/2016 1053   RBC 4.07 05/28/2016 1053   HGB 11.1 (L) 05/28/2016 1053   HCT 34.5 (L) 05/28/2016 1053   PLT 210 05/28/2016 1053   MCV 84.8 05/28/2016 1053   MCH 27.3 05/28/2016 1053   MCHC 32.2 05/28/2016 1053   RDW 13.2 05/28/2016 1053   LYMPHSABS 1.8 05/28/2016 1053   MONOABS 0.3 05/28/2016 1053   EOSABS 0.2 05/28/2016 1053   BASOSABS 0.0 05/28/2016 1053    Iron/TIBC/Ferritin/ %Sat    Component Value Date/Time   IRON 71 01/26/2016 1032   TIBC 311 01/26/2016 1032   FERRITIN 126 01/26/2016 1032   IRONPCTSAT 23 01/26/2016 1032     Review of Systems     Past Medical History:  Diagnosis Date  . Anemia   . Chronic back pain   . Depression   . Diabetes mellitus   . GERD (gastroesophageal reflux disease)   . Hyperlipidemia   . Hypertension   . Hypothyroidism     Past Surgical History:  Procedure Laterality Date  .  BONE MARROW ASPIRATION Left 05/16/15  . BONE MARROW BIOPSY Left 05/16/15  . COLONOSCOPY  03/30/2012   Procedure: COLONOSCOPY;  Surgeon: Rogene Houston, MD;  [facility];  Service: Endoscopy;  Laterality: N/A;  730  . LAMINECTOMY  1980's  . SPINE SURGERY  approx 1983   dr Joya Salm  . TOTAL ABDOMINAL HYSTERECTOMY  approx 1993   fibroids     No Known Allergies  Current Outpatient Prescriptions on File Prior to Visit  Medication Sig Dispense Refill  . acetaminophen (TYLENOL) 500 MG tablet Take 500 mg by mouth every 6 (six) hours as needed (doesn't take often).    Marland Kitchen amLODipine (NORVASC) 10 MG tablet Take 1 tablet (10 mg total) by mouth daily. 90 tablet 1  . aspirin (ASPIRIN ADULT LOW STRENGTH) 81 MG EC tablet Take 81 mg by mouth daily.      . benazepril (LOTENSIN) 20 MG tablet Take 1 tablet (20 mg total) by mouth daily. 90 tablet 1  . Calcium Carbonate-Vit D-Min (CALCIUM 1200) 1200-1000 MG-UNIT CHEW Chew 1 tablet by mouth 2 (two) times daily.      . diphenhydrAMINE (BENADRYL) 25 mg capsule Take 25 mg by mouth at bedtime as needed.    . ferrous sulfate (SLOW IRON) 160 (50 Fe) MG TBCR SR tablet Take 1 tablet by  mouth daily.    . hydrochlorothiazide (HYDRODIURIL) 25 MG tablet TAKE ONE TABLET BY MOUTH ONCE DAILY. 90 tablet 1  . levothyroxine (SYNTHROID, LEVOTHROID) 50 MCG tablet TAKE 1 AND 1/2 TABLETS ONCE DAILY 30 MINUTES TO 1 HOUR BEFORE BREAKFAST ON AN EMPTY STOMACH 135 tablet 1  . Multiple Vitamins-Minerals (ONE-A-DAY EXTRAS ANTIOXIDANT) CAPS Take 1 capsule by mouth daily.     Marland Kitchen omeprazole (PRILOSEC) 20 MG capsule Take 20 mg by mouth daily.    . pravastatin (PRAVACHOL) 20 MG tablet TAKE 1 TABLET EVERY EVENING 90 tablet 0   No current facility-administered medications on file prior to visit.     Objective:   Physical Exam Blood pressure (!) 142/58, pulse 64, temperature 97.9 F (36.6 C), height '5\' 5"'$  (1.651 m), weight 196 lb 14.4 oz (89.3 kg). Alert and oriented. Skin warm and  dry. Oral mucosa is moist.   . Sclera anicteric, conjunctivae is pink. Thyroid not enlarged. No cervical lymphadenopathy. Lungs clear. Heart regular rate and rhythm.  Abdomen is soft. Bowel sounds are positive. No hepatomegaly. No abdominal masses felt. No tenderness.  No edema to lower extremities.  Patient has a rectal prolapse. Reduced while in room. I explained to patient how to reduce this.       Assessment & Plan:  Rectal discharge. Discharge is better since starting the stool softener. Stool are not as hard. Miralax one scoop daily.  I discussed with Dr. Laural Golden. Referral to Dr. Leighton Ruff Kegel exercises.

## 2016-06-14 ENCOUNTER — Ambulatory Visit (HOSPITAL_COMMUNITY)
Admission: RE | Admit: 2016-06-14 | Discharge: 2016-06-14 | Disposition: A | Discharge status: Home / Self Care | Payer: Commercial Managed Care - HMO | Source: Ambulatory Visit | Attending: Family Medicine | Admitting: Family Medicine

## 2016-06-14 DIAGNOSIS — M85851 Other specified disorders of bone density and structure, right thigh: Secondary | ICD-10-CM | POA: Insufficient documentation

## 2016-06-14 DIAGNOSIS — Z13820 Encounter for screening for osteoporosis: Secondary | ICD-10-CM | POA: Insufficient documentation

## 2016-06-14 DIAGNOSIS — Z780 Asymptomatic menopausal state: Secondary | ICD-10-CM | POA: Insufficient documentation

## 2016-06-14 DIAGNOSIS — M8580 Other specified disorders of bone density and structure, unspecified site: Secondary | ICD-10-CM

## 2016-07-07 DIAGNOSIS — E1169 Type 2 diabetes mellitus with other specified complication: Secondary | ICD-10-CM | POA: Diagnosis not present

## 2016-07-07 DIAGNOSIS — E118 Type 2 diabetes mellitus with unspecified complications: Secondary | ICD-10-CM | POA: Diagnosis not present

## 2016-07-07 DIAGNOSIS — D500 Iron deficiency anemia secondary to blood loss (chronic): Secondary | ICD-10-CM | POA: Diagnosis not present

## 2016-07-07 DIAGNOSIS — E038 Other specified hypothyroidism: Secondary | ICD-10-CM | POA: Diagnosis not present

## 2016-07-07 DIAGNOSIS — E559 Vitamin D deficiency, unspecified: Secondary | ICD-10-CM | POA: Diagnosis not present

## 2016-07-07 DIAGNOSIS — E669 Obesity, unspecified: Secondary | ICD-10-CM | POA: Diagnosis not present

## 2016-07-07 LAB — CBC
HCT: 33.2 % — ABNORMAL LOW (ref 35.0–45.0)
Hemoglobin: 10.6 g/dL — ABNORMAL LOW (ref 11.7–15.5)
MCH: 26.8 pg — AB (ref 27.0–33.0)
MCHC: 31.9 g/dL — AB (ref 32.0–36.0)
MCV: 83.8 fL (ref 80.0–100.0)
MPV: 10 fL (ref 7.5–12.5)
PLATELETS: 213 10*3/uL (ref 140–400)
RBC: 3.96 MIL/uL (ref 3.80–5.10)
RDW: 14 % (ref 11.0–15.0)
WBC: 4.4 10*3/uL (ref 3.8–10.8)

## 2016-07-08 LAB — BASIC METABOLIC PANEL WITH GFR
BUN: 20 mg/dL (ref 7–25)
CHLORIDE: 105 mmol/L (ref 98–110)
CO2: 23 mmol/L (ref 20–31)
CREATININE: 1.16 mg/dL — AB (ref 0.60–0.93)
Calcium: 8.9 mg/dL (ref 8.6–10.4)
GFR, EST NON AFRICAN AMERICAN: 46 mL/min — AB (ref >=60)
GFR, Est African American: 52 mL/min — ABNORMAL LOW (ref >=60)
Glucose, Bld: 97 mg/dL (ref 65–99)
Potassium: 3.9 mmol/L (ref 3.5–5.3)
SODIUM: 136 mmol/L (ref 135–146)

## 2016-07-08 LAB — IRON: IRON: 87 ug/dL (ref 45–160)

## 2016-07-08 LAB — TSH: TSH: 1.76 m[IU]/L

## 2016-07-08 LAB — HEMOGLOBIN A1C
Hgb A1c MFr Bld: 6.4 % — ABNORMAL HIGH (ref <5.7)
MEAN PLASMA GLUCOSE: 137 mg/dL

## 2016-07-08 LAB — FERRITIN: FERRITIN: 144 ng/mL (ref 20–288)

## 2016-07-08 LAB — VITAMIN D 25 HYDROXY (VIT D DEFICIENCY, FRACTURES): VIT D 25 HYDROXY: 33 ng/mL (ref 30–100)

## 2016-07-09 ENCOUNTER — Telehealth: Payer: Self-pay

## 2016-07-09 DIAGNOSIS — E038 Other specified hypothyroidism: Secondary | ICD-10-CM

## 2016-07-09 DIAGNOSIS — D500 Iron deficiency anemia secondary to blood loss (chronic): Secondary | ICD-10-CM

## 2016-07-09 DIAGNOSIS — I10 Essential (primary) hypertension: Secondary | ICD-10-CM

## 2016-07-09 DIAGNOSIS — E785 Hyperlipidemia, unspecified: Secondary | ICD-10-CM

## 2016-07-09 DIAGNOSIS — E118 Type 2 diabetes mellitus with unspecified complications: Secondary | ICD-10-CM

## 2016-07-09 NOTE — Telephone Encounter (Signed)
-----  Message from [name], MD sent at 07/08/2016 11:58 AM EST ----- pls advise excellent labs except for anemia, take one multivitamin once daily, no other med changes  Needs fasting lipid, cmp and eGFR, hBA1c and tSH for May visit, also cBC

## 2016-07-24 ENCOUNTER — Encounter (HOSPITAL_COMMUNITY): Payer: Self-pay | Admitting: Hematology & Oncology

## 2016-08-20 ENCOUNTER — Other Ambulatory Visit: Payer: Self-pay | Admitting: Family Medicine

## 2016-10-07 ENCOUNTER — Other Ambulatory Visit (HOSPITAL_COMMUNITY): Payer: Self-pay | Admitting: *Deleted

## 2016-10-07 DIAGNOSIS — D631 Anemia in chronic kidney disease: Secondary | ICD-10-CM

## 2016-10-07 DIAGNOSIS — N183 Chronic kidney disease, stage 3 (moderate): Principal | ICD-10-CM

## 2016-10-08 ENCOUNTER — Encounter (HOSPITAL_COMMUNITY): Payer: Commercial Managed Care - HMO | Attending: Oncology | Admitting: Adult Health

## 2016-10-08 ENCOUNTER — Encounter (HOSPITAL_COMMUNITY): Payer: Commercial Managed Care - HMO

## 2016-10-08 ENCOUNTER — Encounter (HOSPITAL_COMMUNITY): Payer: Self-pay | Admitting: Adult Health

## 2016-10-08 VITALS — BP 144/68 | HR 88 | Temp 97.8°F | Resp 16 | Ht 64.0 in | Wt 195.0 lb

## 2016-10-08 DIAGNOSIS — D472 Monoclonal gammopathy: Secondary | ICD-10-CM | POA: Diagnosis not present

## 2016-10-08 DIAGNOSIS — N183 Chronic kidney disease, stage 3 unspecified: Secondary | ICD-10-CM

## 2016-10-08 DIAGNOSIS — R5383 Other fatigue: Secondary | ICD-10-CM | POA: Diagnosis not present

## 2016-10-08 DIAGNOSIS — D649 Anemia, unspecified: Secondary | ICD-10-CM | POA: Diagnosis not present

## 2016-10-08 DIAGNOSIS — D631 Anemia in chronic kidney disease: Secondary | ICD-10-CM

## 2016-10-08 DIAGNOSIS — N189 Chronic kidney disease, unspecified: Secondary | ICD-10-CM | POA: Diagnosis not present

## 2016-10-08 LAB — CBC WITH DIFFERENTIAL/PLATELET
BASOS PCT: 0 %
Basophils Absolute: 0 10*3/uL (ref 0.0–0.1)
EOS ABS: 0.2 10*3/uL (ref 0.0–0.7)
EOS PCT: 4 %
HCT: 33.5 % — ABNORMAL LOW (ref 36.0–46.0)
HEMOGLOBIN: 11.2 g/dL — AB (ref 12.0–15.0)
Lymphocytes Relative: 37 %
Lymphs Abs: 1.9 10*3/uL (ref 0.7–4.0)
MCH: 27.9 pg (ref 26.0–34.0)
MCHC: 33.4 g/dL (ref 30.0–36.0)
MCV: 83.5 fL (ref 78.0–100.0)
MONO ABS: 0.2 10*3/uL (ref 0.1–1.0)
MONOS PCT: 5 %
NEUTROS PCT: 54 %
Neutro Abs: 2.8 10*3/uL (ref 1.7–7.7)
PLATELETS: 218 10*3/uL (ref 150–400)
RBC: 4.01 MIL/uL (ref 3.87–5.11)
RDW: 13.6 % (ref 11.5–15.5)
WBC: 5.1 10*3/uL (ref 4.0–10.5)

## 2016-10-08 LAB — COMPREHENSIVE METABOLIC PANEL
ALBUMIN: 3.9 g/dL (ref 3.5–5.0)
ALT: 23 U/L (ref 14–54)
ANION GAP: 6 (ref 5–15)
AST: 27 U/L (ref 15–41)
Alkaline Phosphatase: 58 U/L (ref 38–126)
BUN: 25 mg/dL — ABNORMAL HIGH (ref 6–20)
CO2: 21 mmol/L — AB (ref 22–32)
Calcium: 9.3 mg/dL (ref 8.9–10.3)
Chloride: 111 mmol/L (ref 101–111)
Creatinine, Ser: 1.4 mg/dL — ABNORMAL HIGH (ref 0.44–1.00)
GFR calc non Af Amer: 35 mL/min — ABNORMAL LOW (ref >60)
GFR, EST AFRICAN AMERICAN: 41 mL/min — AB (ref >60)
GLUCOSE: 123 mg/dL — AB (ref 65–99)
POTASSIUM: 3.8 mmol/L (ref 3.5–5.1)
SODIUM: 138 mmol/L (ref 135–145)
Total Bilirubin: 0.6 mg/dL (ref 0.3–1.2)
Total Protein: 9 g/dL — ABNORMAL HIGH (ref 6.5–8.1)

## 2016-10-08 NOTE — Patient Instructions (Addendum)
[place] at [facility] Discharge Instructions  RECOMMENDATIONS MADE BY THE CONSULTANT AND ANY TEST RESULTS WILL BE SENT TO YOUR REFERRING PHYSICIAN.  You were seen today by Mike Craze NP. Labs today, we will call you with those results. Return in 4 months for labs and follow up.    Thank you for choosing [place] at [facility] to provide your oncology and hematology care.  To afford each patient quality time with our provider, please arrive at least 15 minutes before your scheduled appointment time.    If you have a lab appointment with the [facility] please come in thru the  Main Entrance and check in at the main information desk  You need to re-schedule your appointment should you arrive 10 or more minutes late.  We strive to give you quality time with our providers, and arriving late affects you and other patients whose appointments are after yours.  Also, if you no show three or more times for appointments you may be dismissed from the clinic at the providers discretion.     Again, thank you for choosing [facility].  Our hope is that these requests will decrease the amount of time that you wait before being seen by our physicians.       _____________________________________________________________  Should you have questions after your visit to [facility], please contact our office at [phone] between the hours of 8:30 a.m. and 4:30 p.m.  Voicemails left after 4:30 p.m. will not be returned until the following business day.  For prescription refill requests, have your pharmacy contact our office.       Resources For Cancer Patients and their Caregivers ? American Cancer Society: Can assist with transportation, wigs, general needs, runs Look Good Feel Better.        2180662012 ? Cancer Care: Provides financial assistance, online support groups, medication/co-pay assistance.   1-800-813-HOPE (718) 816-3883) ? [facility] Assists [place] cancer patients and their families through emotional , educational and financial support.  508-542-5693 ? [place] DSS Where to apply for food stamps, Medicaid and utility assistance. 7023686700 ? [facility]: Transportation to medical appointments. (531) 097-8789 ? Social Security Administration: May apply for disability if have a Stage IV cancer. 281-159-3917 412 535 5689 ? [employer], Disability and Transit Services: Assists with nutrition, care and transit needs. [place] Support Programs: @10RELATIVEDAYS @ > Cancer Support Group  2nd Tuesday of the month 1pm-2pm, Journey Room  > Creative Journey  3rd Tuesday of the month 1130am-1pm, Journey Room  > Look Good Feel Better  1st Wednesday of the month 10am-12 noon, Journey Room (Call [place] to register [phone])

## 2016-10-10 NOTE — Progress Notes (Signed)
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 Donald Pore last note on 05/28/16)     INTERVAL HISTORY:  Renee Collins 76 y.o. female presents for continued follow-up for MGUS.   She is here unaccompanied today. Overall, she reports feeling pretty well.  Her appetite is good (about 75% of baseline); she feels fatigued with energy levels about 50% of baseline.  She feels like she sleeps well, but "I wake up in the mornings and still feel so tired."   Otherwise, she is largely without complaints today. Denies any blood in her stool/dark or tarry stools, hematuria, or vaginal bleeding. She did see GI specialists in 05/2016.     REVIEW OF SYSTEMS:  Review of Systems  Constitutional: Positive for fatigue. Negative for chills and fever.  HENT:  Negative.  Negative for lump/mass and nosebleeds.   Eyes: Negative.   Respiratory: Negative.  Negative for cough and shortness of breath.   Cardiovascular: Negative.  Negative for chest pain and leg swelling.  Gastrointestinal: Negative.  Negative for abdominal pain, blood in stool, constipation, diarrhea, nausea and vomiting.  Endocrine: Negative.   Genitourinary: Negative.  Negative for dysuria and hematuria.   Musculoskeletal: Negative.  Negative for arthralgias.  Skin: Negative.  Negative for rash.  Neurological: Negative.  Negative for dizziness and headaches.  Hematological: Negative.  Negative for adenopathy. Does not bruise/bleed easily.  Psychiatric/Behavioral: Negative.  Negative for depression and sleep disturbance. The patient is not nervous/anxious.      PAST MEDICAL/SURGICAL HISTORY:  Past Medical History:  Diagnosis  Date  . Anemia   . Chronic back pain   . Depression   . Diabetes mellitus   . GERD (gastroesophageal reflux disease)   . Hyperlipidemia   . Hypertension   . Hypothyroidism    Past Surgical History:  Procedure Laterality Date  . BONE MARROW ASPIRATION Left 05/16/15  . BONE MARROW BIOPSY Left 05/16/15  . COLONOSCOPY  03/30/2012   Procedure: COLONOSCOPY;  Surgeon: Rogene Houston, MD;  [facility];  Service: Endoscopy;  Laterality: N/A;  730  . LAMINECTOMY  1980's  . SPINE SURGERY  approx 1983   dr Joya Salm  . TOTAL ABDOMINAL HYSTERECTOMY  approx 1993   fibroids      SOCIAL HISTORY:  Social History   Social History  . Marital status: Divorced    Spouse name: N/A  . Number of children: 3  . Years of education: N/A   Occupational History  . LPN    Social History Main Topics  . Smoking status: Never Smoker  . Smokeless tobacco: Never Used  . Alcohol use No  . Drug use: No  . Sexual activity: Not Currently   Other Topics Concern  . Not on file   Social History Narrative   Pt has 2 living adult children, 1 desease at age 83 secondary congential heart disease     FAMILY HISTORY:  Family History  Problem Relation Age of Onset  . Heart attack Mother 52  . Heart disease Mother   . Cancer Mother     type unknown  . Diabetes Mother   . Kidney failure Father   . Kidney disease Father   .  Myasthenia gravis Brother   . Cancer Sister 74    breast  . Diabetes Sister   . Heart disease Brother   . Diabetes Sister     CURRENT MEDICATIONS:  Outpatient Encounter Prescriptions as of 10/08/2016  Medication Sig Note  . acetaminophen (TYLENOL) 500 MG tablet Take 500 mg by mouth every 6 (six) hours as needed (doesn't take often).   Marland Kitchen amLODipine (NORVASC) 10 MG tablet TAKE 1 TABLET EVERY DAY   . aspirin (ASPIRIN ADULT LOW STRENGTH) 81 MG EC tablet Take 81 mg by mouth daily.     . benazepril (LOTENSIN) 20 MG tablet TAKE 1 TABLET EVERY DAY   . Calcium Carbonate-Vit  D-Min (CALCIUM 1200) 1200-1000 MG-UNIT CHEW Chew 1 tablet by mouth 2 (two) times daily.     . diphenhydrAMINE (BENADRYL) 25 mg capsule Take 25 mg by mouth at bedtime as needed.   . docusate sodium (COLACE) 50 MG capsule Take 50 mg by mouth daily as needed for mild constipation.   . ferrous sulfate (SLOW IRON) 160 (50 Fe) MG TBCR SR tablet Take 1 tablet by mouth daily.   . hydrochlorothiazide (HYDRODIURIL) 25 MG tablet TAKE 1 TABLET EVERY DAY   . levothyroxine (SYNTHROID, LEVOTHROID) 50 MCG tablet TAKE 1 AND 1/2 TABLETS ONCE DAILY 30 MINUTES TO 1 HOUR BEFORE BREAKFAST ON AN EMPTY STOMACH   . Multiple Vitamins-Minerals (ONE-A-DAY EXTRAS ANTIOXIDANT) CAPS Take 1 capsule by mouth daily.    Marland Kitchen omeprazole (PRILOSEC) 20 MG capsule Take 20 mg by mouth daily. 05/30/2015: Takes as needed  . pravastatin (PRAVACHOL) 20 MG tablet TAKE 1 TABLET EVERY EVENING    No facility-administered encounter medications on file as of 10/08/2016.     ALLERGIES:  No Known Allergies   PHYSICAL EXAM:  ECOG Performance status: 1 - Symptomatic, but independent.   Vitals:   10/08/16 1323  BP: (!) 144/68  Pulse: 88  Resp: 16  Temp: 97.8 F (36.6 C)   Filed Weights   10/08/16 1323  Weight: 195 lb (88.5 kg)    Physical Exam  Constitutional: She is oriented to person, place, and time and well-developed, well-nourished, and in no distress.  HENT:  Head: Normocephalic.  Mouth/Throat: Oropharynx is clear and moist. No oropharyngeal exudate.  Eyes: Conjunctivae are normal. Pupils are equal, round, and reactive to light. No scleral icterus.  Neck: Normal range of motion. Neck supple.  Cardiovascular: Normal rate, regular rhythm and normal heart sounds.   Pulmonary/Chest: Effort normal and breath sounds normal. No respiratory distress.  Abdominal: Soft. Bowel sounds are normal. There is no tenderness.  Musculoskeletal: Normal range of motion. She exhibits no edema.  Lymphadenopathy:    She has no cervical adenopathy.         Right: No supraclavicular adenopathy present.       Left: No supraclavicular adenopathy present.  Neurological: She is alert and oriented to person, place, and time. No cranial nerve deficit. Gait normal.  Skin: Skin is warm and dry. No rash noted.  Psychiatric: Mood, memory, affect and judgment normal.  Nursing note and vitals reviewed.    LABORATORY DATA:  I have reviewed the labs as listed.  CBC    Component Value Date/Time   WBC 5.1 10/08/2016 1238   RBC 4.01 10/08/2016 1238   HGB 11.2 (L) 10/08/2016 1238   HCT 33.5 (L) 10/08/2016 1238   PLT 218 10/08/2016 1238   MCV 83.5 10/08/2016 1238   MCH 27.9 10/08/2016 1238   MCHC 33.4 10/08/2016 1238  RDW 13.6 10/08/2016 1238   LYMPHSABS 1.9 10/08/2016 1238   MONOABS 0.2 10/08/2016 1238   EOSABS 0.2 10/08/2016 1238   BASOSABS 0.0 10/08/2016 1238   CMP Latest Ref Rng & Units 10/08/2016 07/07/2016 05/28/2016  Glucose 65 - 99 mg/dL 123(H) 97 100(H)  BUN 6 - 20 mg/dL 25(H) 20 21(H)  Creatinine 0.44 - 1.00 mg/dL 1.40(H) 1.16(H) 1.33(H)  Sodium 135 - 145 mmol/L 138 136 135  Potassium 3.5 - 5.1 mmol/L 3.8 3.9 3.6  Chloride 101 - 111 mmol/L 111 105 106  CO2 22 - 32 mmol/L 21(L) 23 23  Calcium 8.9 - 10.3 mg/dL 9.3 8.9 9.3  Total Protein 6.5 - 8.1 g/dL 9.0(H) - 8.9(H)  Total Bilirubin 0.3 - 1.2 mg/dL 0.6 - 0.5  Alkaline Phos 38 - 126 U/L 58 - 55  AST 15 - 41 U/L 27 - 29  ALT 14 - 54 U/L 23 - 24    PENDING LABS:  SPEP, IgG, IgM, IgA, Kappa/Lambda light chains pending.   DIAGNOSTIC IMAGING:    PATHOLOGY:  Bone marrow biopsy: 05/16/15       ASSESSMENT & PLAN:   MGUS:  -SPEP results pending for today.  -Last IgG elevated at 3165 in 05/2016. Kappa/Lambda light chains elevated in 05/2016, along with kappa/lambda light chain ratio elevation.  -Clinically she is doing well. Her creatinine and serum calcium are stable.  -We will call patient when lab results become available. Of note, her previous bone marrow biopsy done  in 04/2015. She understands that we may need to repeat bone marrow biopsy in the future and if we do, then we would refer to [facility] for image-guided bone marrow biopsy.  We will continue with observation for now.  -Return to cancer center in 4 months with labs.   Anemia:  -Likely due to chronic kidney disease.  -Hgb stable at 11.2 g/dL. Platelets and WBCs normal. No intervention at this time; we will continue to monitor.  -Fecal occult blood cards negative in 05/2016.   Fatigue:  -Certainly, anemia can contribute to fatigue. Her hemoglobin is improved from previous and her energy levels remain low.  States that she has been told that she does snore during sleep; unsure if she has apneic episodes. Recommended she talk to her PCP about considering sleep study evaluation if severe daytime fatigue persists.  -I will add on anemia panel (including iron studies and vitamin B12/folate) to labs at next follow-up visit.    Health maintenance/Wellness promotion:  -She is up to date with her annual mammograms; last mammogram done in 04/2016.  -Recommended continued follow-up with PCP; she is scheduled to see [name], MD next month per her report.     Dispo:  -Return to cancer center in 4 months with labs.    All questions were answered to patient's stated satisfaction. Encouraged patient to call with any new concerns or questions before her next visit to the cancer center and we can certain see her sooner, if needed.    Plan of care discussed with Dr. Talbert Cage, who agrees with the above aforementioned.     Orders placed this encounter:  Orders Placed This Encounter  Procedures  . Protein electrophoresis, serum  . Immunofixation electrophoresis  . IgG, IgA, IgM  . CBC with Differential/Platelet  . Comprehensive metabolic panel  . Kappa/lambda light chains  . Vitamin B12  . Folate  . Iron and TIBC  . Ferritin      Mike Craze, NP [facility] [phone]

## 2016-10-11 LAB — KAPPA/LAMBDA LIGHT CHAINS
KAPPA FREE LGHT CHN: 180 mg/L — AB (ref 3.3–19.4)
KAPPA, LAMDA LIGHT CHAIN RATIO: 9.47 — AB (ref 0.26–1.65)
Lambda free light chains: 19 mg/L (ref 5.7–26.3)

## 2016-10-11 NOTE — Progress Notes (Signed)
Note recommends consideration of sleep study

## 2016-10-12 LAB — MULTIPLE MYELOMA PANEL, SERUM
ALBUMIN SERPL ELPH-MCNC: 3.7 g/dL (ref 2.9–4.4)
ALPHA 1: 0.3 g/dL (ref 0.0–0.4)
Albumin/Glob SerPl: 0.8 (ref 0.7–1.7)
Alpha2 Glob SerPl Elph-Mcnc: 0.9 g/dL (ref 0.4–1.0)
B-Globulin SerPl Elph-Mcnc: 1 g/dL (ref 0.7–1.3)
Gamma Glob SerPl Elph-Mcnc: 2.4 g/dL — ABNORMAL HIGH (ref 0.4–1.8)
Globulin, Total: 4.7 g/dL — ABNORMAL HIGH (ref 2.2–3.9)
IGA: 75 mg/dL (ref 64–422)
IgG (Immunoglobin G), Serum: 2893 mg/dL — ABNORMAL HIGH (ref 700–1600)
IgM, Serum: 55 mg/dL (ref 26–217)
M PROTEIN SERPL ELPH-MCNC: 2.2 g/dL — AB
TOTAL PROTEIN ELP: 8.4 g/dL (ref 6.0–8.5)

## 2016-10-25 ENCOUNTER — Ambulatory Visit (INDEPENDENT_AMBULATORY_CARE_PROVIDER_SITE_OTHER): Payer: Commercial Managed Care - HMO

## 2016-10-25 VITALS — BP 138/80 | HR 77 | Temp 98.8°F | Ht 64.5 in | Wt 194.1 lb

## 2016-10-25 DIAGNOSIS — Z0000 Encounter for general adult medical examination without abnormal findings: Secondary | ICD-10-CM

## 2016-10-25 NOTE — Progress Notes (Signed)
Subjective:   THULA STEWART is a 76 y.o. female who presents for Medicare Annual (Subsequent) preventive examination.  Review of Systems:  Cardiac Risk Factors include: advanced age (>66mn, >>43women);diabetes mellitus;dyslipidemia;hypertension;obesity (BMI >30kg/m2)     Objective:     Vitals: BP 138/80   Pulse 77   Temp 98.8 F (37.1 C) (Oral)   Ht 5' 4.5" (1.638 m)   Wt 194 lb 1.3 oz (88 kg)   SpO2 97%   BMI 32.80 kg/m   Body mass index is 32.8 kg/m.   Tobacco History  Smoking Status  . Never Smoker  Smokeless Tobacco  . Never Used     Counseling given: Not Answered   Past Medical History:  Diagnosis Date  . Anemia   . Chronic back pain   . Depression   . Diabetes mellitus   . GERD (gastroesophageal reflux disease)   . Hyperlipidemia   . Hypertension   . Hypothyroidism    Past Surgical History:  Procedure Laterality Date  . BONE MARROW ASPIRATION Left 05/16/15  . BONE MARROW BIOPSY Left 05/16/15  . COLONOSCOPY  03/30/2012   Procedure: COLONOSCOPY;  Surgeon: NRogene Houston MD;  [facility];  Service: Endoscopy;  Laterality: N/A;  730  . LAMINECTOMY  1980's  . SPINE SURGERY  approx 1983   dr bJoya Salm . TOTAL ABDOMINAL HYSTERECTOMY  approx 1993   fibroids    Family History  Problem Relation Age of Onset  . Heart attack Mother 8104 . Heart disease Mother   . Cancer Mother     type unknown  . Diabetes Mother   . Kidney failure Father   . Kidney disease Father   . Myasthenia gravis Brother   . Diabetes Sister   . Breast cancer Sister 674 . Heart disease Brother   . Diabetes Sister   . Sickle cell anemia Sister   . Cancer Sister     unknown type   History  Sexual Activity  . Sexual activity: Not Currently    Outpatient Encounter Prescriptions as of 10/25/2016  Medication Sig  . acetaminophen (TYLENOL) 500 MG tablet Take 500 mg by mouth every 6 (six) hours as needed (doesn't take often).  .Marland KitchenamLODipine (NORVASC) 10 MG tablet  TAKE 1 TABLET EVERY DAY  . aspirin (ASPIRIN ADULT LOW STRENGTH) 81 MG EC tablet Take 81 mg by mouth daily.    . benazepril (LOTENSIN) 20 MG tablet TAKE 1 TABLET EVERY DAY  . Calcium Carbonate-Vit D-Min (CALCIUM 1200) 1200-1000 MG-UNIT CHEW Chew 1 tablet by mouth 2 (two) times daily.    . diphenhydrAMINE (BENADRYL) 25 mg capsule Take 25 mg by mouth at bedtime as needed.  . docusate sodium (COLACE) 50 MG capsule Take 50 mg by mouth daily as needed for mild constipation.  . Ferrous Sulfate (IRON) 28 MG TABS Take 1 tablet by mouth daily.  .Marland Kitchengabapentin (NEURONTIN) 100 MG capsule Take 100 mg by mouth 3 (three) times daily as needed.  . hydrochlorothiazide (HYDRODIURIL) 25 MG tablet TAKE 1 TABLET EVERY DAY  . levothyroxine (SYNTHROID, LEVOTHROID) 50 MCG tablet TAKE 1 AND 1/2 TABLETS ONCE DAILY 30 MINUTES TO 1 HOUR BEFORE BREAKFAST ON AN EMPTY STOMACH  . Multiple Vitamins-Minerals (ONE-A-DAY EXTRAS ANTIOXIDANT) CAPS Take 1 capsule by mouth daily.   .Marland Kitchenomeprazole (PRILOSEC) 20 MG capsule Take 20 mg by mouth daily.  . pravastatin (PRAVACHOL) 20 MG tablet TAKE 1 TABLET EVERY EVENING  . Simethicone 125 MG CAPS  Take 1 capsule by mouth daily as needed.  . [DISCONTINUED] ferrous sulfate (SLOW IRON) 160 (50 Fe) MG TBCR SR tablet Take 1 tablet by mouth daily.   No facility-administered encounter medications on file as of 10/25/2016.     Activities of Daily Living In your present state of health, do you have any difficulty performing the following activities: 10/25/2016 01/14/2016  Hearing? N N  Vision? N N  Difficulty concentrating or making decisions? N N  Walking or climbing stairs? N N  Dressing or bathing? N N  Doing errands, shopping? N N  Preparing Food and eating ? N -  Using the Toilet? N -  In the past six months, have you accidently leaked urine? N -  Do you have problems with loss of bowel control? N -  Managing your Medications? N -  Managing your Finances? N -  Housekeeping or managing your  Housekeeping? N -  Some recent data might be hidden    Patient Care Team: [name], MD as PCP - General Patrici Ranks, MD as Consulting Physician (Hematology and Oncology) Rutherford Guys, MD as Consulting Physician (Ophthalmology)    Assessment:    Exercise Activities and Dietary recommendations Current Exercise Habits: Home exercise routine, Type of exercise: walking, Time (Minutes): 30, Frequency (Times/Week): 5, Weekly Exercise (Minutes/Week): 150, Intensity: Mild, Exercise limited by: None identified  Goals    . Have 3 meals a day          Recommend eating 3 balanced meals a day.      Fall Risk Fall Risk  10/25/2016 01/14/2016 09/04/2015 09/19/2014 05/02/2014  Falls in the past year? No No No No No  Risk for fall due to : - - - - -  Risk for fall due to (comments): - - - - -   Depression Screen PHQ 2/9 Scores 10/25/2016 01/14/2016 09/19/2014 12/11/2013  PHQ - 2 Score 0 0 2 4  PHQ- 9 Score - '1 6 11     '$ Cognitive Function: Normal   6CIT Screen 10/25/2016  What Year? 0 points  What month? 0 points  What time? 0 points  Count back from 20 0 points  Months in reverse 0 points  Repeat phrase 0 points  Total Score 0    Immunization History  Administered Date(s) Administered  . Influenza Split 04/28/2011, 04/12/2012  . Influenza,inj,Quad PF,36+ Mos 04/09/2013, 05/02/2014, 03/11/2015, 05/28/2016  . Pneumococcal Conjugate-13 09/30/2014  . Pneumococcal Polysaccharide-23 04/28/2011  . Tdap 04/28/2011  . Zoster 08/26/2011   Screening Tests Health Maintenance  Topic Date Due  . HEMOGLOBIN A1C  01/04/2017  . INFLUENZA VACCINE  01/26/2017  . OPHTHALMOLOGY EXAM  02/03/2017  . FOOT EXAM  06/07/2017  . TETANUS/TDAP  04/27/2021  . DEXA SCAN  Completed  . PNA vac Low Risk Adult  Completed      Plan:   I have personally reviewed and noted the following in the patient's chart:   . Medical and social history . Use of alcohol, tobacco or illicit drugs  . Current  medications and supplements . Functional ability and status . Nutritional status . Physical activity . Advanced directives . List of other physicians . Hospitalizations, surgeries, and ER visits in previous 12 months . Vitals . Screenings to include cognitive, depression, and falls . Referrals and appointments  In addition, I have reviewed and discussed with patient certain preventive protocols, quality metrics, and best practice recommendations. A written personalized care plan for preventive services as well  as general preventive health recommendations were provided to patient.     Stormy Fabian, LPN  0/04/33

## 2016-10-25 NOTE — Patient Instructions (Addendum)
Renee Collins , Thank you for taking time to come for your Medicare Wellness Visit. I appreciate your ongoing commitment to your health goals. Please review the following plan we discussed and let me know if I can assist you in the future.   Screening recommendations/referrals: Colonoscopy: Up to date, and is no longer required Mammogram: Up to date, next due 04/2017 Bone Density: Up to date Diabetic Eye Exam: Up to date, next due 01/2017 Recommended yearly dental visit for hygiene and checkup  Vaccinations: Influenza vaccine: Up to date, next due 01/2017 Pneumococcal vaccine: Up to date Tdap vaccine: Up to date, next due 03/2021 Shingles vaccine: Up to date    Advanced directives: Advance directive discussed with you today. I have provided a copy for you to complete at home and have notarized. Once this is complete please bring a copy in to our office so we can scan it into your chart.  Conditions/risks identified: Obese, recommend eating 3 balanced meals a day.  Next appointment: Follow up with Dr. Moshe Cipro on 11/04/2016 at 1:00 pm. Follow up in 1 year for your annual wellness visit.   Preventive Care 78 Years and Older, Female Preventive care refers to lifestyle choices and visits with your health care provider that can promote health and wellness. What does preventive care include?  A yearly physical exam. This is also called an annual well check.  Dental exams once or twice a year.  Routine eye exams. Ask your health care provider how often you should have your eyes checked.  Personal lifestyle choices, including:  Daily care of your teeth and gums.  Regular physical activity.  Eating a healthy diet.  Avoiding tobacco and drug use.  Limiting alcohol use.  Practicing safe sex.  Taking low-dose aspirin every day.  Taking vitamin and mineral supplements as recommended by your health care provider. What happens during an annual well check? The services and screenings done by  your health care provider during your annual well check will depend on your age, overall health, lifestyle risk factors, and family history of disease. Counseling  Your health care provider may ask you questions about your:  Alcohol use.  Tobacco use.  Drug use.  Emotional well-being.  Home and relationship well-being.  Sexual activity.  Eating habits.  History of falls.  Memory and ability to understand (cognition).  Work and work Statistician.  Reproductive health. Screening  You may have the following tests or measurements:  Height, weight, and BMI.  Blood pressure.  Lipid and cholesterol levels. These may be checked every 5 years, or more frequently if you are over 23 years old.  Skin check.  Lung cancer screening. You may have this screening every year starting at age 22 if you have a 30-pack-year history of smoking and currently smoke or have quit within the past 15 years.  Fecal occult blood test (FOBT) of the stool. You may have this test every year starting at age 76.  Flexible sigmoidoscopy or colonoscopy. You may have a sigmoidoscopy every 5 years or a colonoscopy every 10 years starting at age 89.  Hepatitis C blood test.  Hepatitis B blood test.  Sexually transmitted disease (STD) testing.  Diabetes screening. This is done by checking your blood sugar (glucose) after you have not eaten for a while (fasting). You may have this done every 1-3 years.  Bone density scan. This is done to screen for osteoporosis. You may have this done starting at age 45.  Mammogram. This may be  done every 1-2 years. Talk to your health care provider about how often you should have regular mammograms. Talk with your health care provider about your test results, treatment options, and if necessary, the need for more tests. Vaccines  Your health care provider may recommend certain vaccines, such as:  Influenza vaccine. This is recommended every year.  Tetanus,  diphtheria, and acellular pertussis (Tdap, Td) vaccine. You may need a Td booster every 10 years.  Zoster vaccine. You may need this after age 21.  Pneumococcal 13-valent conjugate (PCV13) vaccine. One dose is recommended after age 76.  Pneumococcal polysaccharide (PPSV23) vaccine. One dose is recommended after age 49. Talk to your health care provider about which screenings and vaccines you need and how often you need them. This information is not intended to replace advice given to you by your health care provider. Make sure you discuss any questions you have with your health care provider. Document Released: 07/11/2015 Document Revised: 03/03/2016 Document Reviewed: 04/15/2015 Elsevier Interactive Patient Education  2017 [employer] Prevention in the Home Falls can cause injuries. They can happen to people of all ages. There are many things you can do to make your home safe and to help prevent falls. What can I do on the outside of my home?  Regularly fix the edges of walkways and driveways and fix any cracks.  Remove anything that might make you trip as you walk through a door, such as a raised step or threshold.  Trim any bushes or trees on the path to your home.  Use bright outdoor lighting.  Clear any walking paths of anything that might make someone trip, such as rocks or tools.  Regularly check to see if handrails are loose or broken. Make sure that both sides of any steps have handrails.  Any raised decks and porches should have guardrails on the edges.  Have any leaves, snow, or ice cleared regularly.  Use sand or salt on walking paths during winter.  Clean up any spills in your garage right away. This includes oil or grease spills. What can I do in the bathroom?  Use night lights.  Install grab bars by the toilet and in the tub and shower. Do not use towel bars as grab bars.  Use non-skid mats or decals in the tub or shower.  If you need to sit down in  the shower, use a plastic, non-slip stool.  Keep the floor dry. Clean up any water that spills on the floor as soon as it happens.  Remove soap buildup in the tub or shower regularly.  Attach bath mats securely with double-sided non-slip rug tape.  Do not have throw rugs and other things on the floor that can make you trip. What can I do in the bedroom?  Use night lights.  Make sure that you have a light by your bed that is easy to reach.  Do not use any sheets or blankets that are too big for your bed. They should not hang down onto the floor.  Have a firm chair that has side arms. You can use this for support while you get dressed.  Do not have throw rugs and other things on the floor that can make you trip. What can I do in the kitchen?  Clean up any spills right away.  Avoid walking on wet floors.  Keep items that you use a lot in easy-to-reach places.  If you need to reach something above you, use  a strong step stool that has a grab bar.  Keep electrical cords out of the way.  Do not use floor polish or wax that makes floors slippery. If you must use wax, use non-skid floor wax.  Do not have throw rugs and other things on the floor that can make you trip. What can I do with my stairs?  Do not leave any items on the stairs.  Make sure that there are handrails on both sides of the stairs and use them. Fix handrails that are broken or loose. Make sure that handrails are as long as the stairways.  Check any carpeting to make sure that it is firmly attached to the stairs. Fix any carpet that is loose or worn.  Avoid having throw rugs at the top or bottom of the stairs. If you do have throw rugs, attach them to the floor with carpet tape.  Make sure that you have a light switch at the top of the stairs and the bottom of the stairs. If you do not have them, ask someone to add them for you. What else can I do to help prevent falls?  Wear shoes that:  Do not have high  heels.  Have rubber bottoms.  Are comfortable and fit you well.  Are closed at the toe. Do not wear sandals.  If you use a stepladder:  Make sure that it is fully opened. Do not climb a closed stepladder.  Make sure that both sides of the stepladder are locked into place.  Ask someone to hold it for you, if possible.  Clearly mark and make sure that you can see:  Any grab bars or handrails.  First and last steps.  Where the edge of each step is.  Use tools that help you move around (mobility aids) if they are needed. These include:  Canes.  Walkers.  Scooters.  Crutches.  Turn on the lights when you go into a dark area. Replace any light bulbs as soon as they burn out.  Set up your furniture so you have a clear path. Avoid moving your furniture around.  If any of your floors are uneven, fix them.  If there are any pets around you, be aware of where they are.  Review your medicines with your doctor. Some medicines can make you feel dizzy. This can increase your chance of falling. Ask your doctor what other things that you can do to help prevent falls. This information is not intended to replace advice given to you by your health care provider. Make sure you discuss any questions you have with your health care provider. Document Released: 04/10/2009 Document Revised: 11/20/2015 Document Reviewed: 07/19/2014 Elsevier Interactive Patient Education  2017 [employer].

## 2016-11-02 ENCOUNTER — Other Ambulatory Visit: Payer: Self-pay | Admitting: Family Medicine

## 2016-11-02 DIAGNOSIS — E785 Hyperlipidemia, unspecified: Secondary | ICD-10-CM | POA: Diagnosis not present

## 2016-11-02 DIAGNOSIS — D539 Nutritional anemia, unspecified: Secondary | ICD-10-CM | POA: Diagnosis not present

## 2016-11-02 DIAGNOSIS — E118 Type 2 diabetes mellitus with unspecified complications: Secondary | ICD-10-CM | POA: Diagnosis not present

## 2016-11-02 DIAGNOSIS — I10 Essential (primary) hypertension: Secondary | ICD-10-CM | POA: Diagnosis not present

## 2016-11-02 LAB — LIPID PANEL
CHOLESTEROL: 145 mg/dL (ref <200)
HDL: 61 mg/dL (ref >50)
LDL Cholesterol: 71 mg/dL (ref <100)
TRIGLYCERIDES: 67 mg/dL (ref <150)
Total CHOL/HDL Ratio: 2.4 Ratio (ref <5.0)
VLDL: 13 mg/dL (ref <30)

## 2016-11-02 LAB — COMPLETE METABOLIC PANEL WITH GFR
ALBUMIN: 4 g/dL (ref 3.6–5.1)
ALT: 17 U/L (ref 6–29)
AST: 22 U/L (ref 10–35)
Alkaline Phosphatase: 56 U/L (ref 33–130)
BILIRUBIN TOTAL: 0.4 mg/dL (ref 0.2–1.2)
BUN: 26 mg/dL — ABNORMAL HIGH (ref 7–25)
CALCIUM: 9.6 mg/dL (ref 8.6–10.4)
CO2: 24 mmol/L (ref 20–31)
CREATININE: 1.28 mg/dL — AB (ref 0.60–0.93)
Chloride: 106 mmol/L (ref 98–110)
GFR, EST AFRICAN AMERICAN: 47 mL/min — AB (ref >=60)
GFR, Est Non African American: 40 mL/min — ABNORMAL LOW (ref >=60)
Glucose, Bld: 108 mg/dL — ABNORMAL HIGH (ref 65–99)
Potassium: 3.9 mmol/L (ref 3.5–5.3)
Sodium: 138 mmol/L (ref 135–146)
TOTAL PROTEIN: 8.6 g/dL — AB (ref 6.1–8.1)

## 2016-11-02 LAB — TSH: TSH: 2.74 m[IU]/L

## 2016-11-02 LAB — CBC
HCT: 34.1 % — ABNORMAL LOW (ref 35.0–45.0)
Hemoglobin: 11 g/dL — ABNORMAL LOW (ref 11.7–15.5)
MCH: 27.3 pg (ref 27.0–33.0)
MCHC: 32.3 g/dL (ref 32.0–36.0)
MCV: 84.6 fL (ref 80.0–100.0)
MPV: 9.7 fL (ref 7.5–12.5)
PLATELETS: 221 10*3/uL (ref 140–400)
RBC: 4.03 MIL/uL (ref 3.80–5.10)
RDW: 13.7 % (ref 11.0–15.0)
WBC: 4.2 10*3/uL (ref 3.8–10.8)

## 2016-11-03 LAB — HEMOGLOBIN A1C
HEMOGLOBIN A1C: 6.2 % — AB (ref <5.7)
MEAN PLASMA GLUCOSE: 131 mg/dL

## 2016-11-04 ENCOUNTER — Ambulatory Visit (INDEPENDENT_AMBULATORY_CARE_PROVIDER_SITE_OTHER): Payer: Commercial Managed Care - HMO | Admitting: Family Medicine

## 2016-11-04 ENCOUNTER — Encounter: Payer: Self-pay | Admitting: Family Medicine

## 2016-11-04 VITALS — BP 120/70 | HR 98 | Resp 16 | Ht 64.5 in | Wt 195.0 lb

## 2016-11-04 DIAGNOSIS — E785 Hyperlipidemia, unspecified: Secondary | ICD-10-CM | POA: Diagnosis not present

## 2016-11-04 DIAGNOSIS — I10 Essential (primary) hypertension: Secondary | ICD-10-CM

## 2016-11-04 DIAGNOSIS — M25422 Effusion, left elbow: Secondary | ICD-10-CM

## 2016-11-04 DIAGNOSIS — M549 Dorsalgia, unspecified: Secondary | ICD-10-CM

## 2016-11-04 DIAGNOSIS — R5382 Chronic fatigue, unspecified: Secondary | ICD-10-CM

## 2016-11-04 DIAGNOSIS — E038 Other specified hypothyroidism: Secondary | ICD-10-CM | POA: Diagnosis not present

## 2016-11-04 DIAGNOSIS — G8929 Other chronic pain: Secondary | ICD-10-CM | POA: Diagnosis not present

## 2016-11-04 LAB — IRON: IRON: 83 ug/dL (ref 45–160)

## 2016-11-04 NOTE — Assessment & Plan Note (Signed)
Chronic fatigue , awakens not rested, and possibly snores, needs sleep study, will refer

## 2016-11-04 NOTE — Patient Instructions (Addendum)
Annual physical exam dec 12 or after , call if you need me sooner  Please call and come for flu vaccine in the fall, starting by mid September  Labs are good, we will call if you still need to take iron, I doubt it.  You are referred for sleep study  Please get X ray of left elbow due to your cocern re "hard area"  tSH, chem 7 and eGFr, tSH and hBA1C 1 week beforre appointment , call in October for lab order  It is important that you exercise regularly at least 30 minutes 5 times a week. If you develop chest pain, have severe difficulty breathing, or feel very tired, stop exercising immediately and seek medical attention    Please work on good  health habits so that your health will improve. 1. Commitment to daily physical activity for 30 to 60  minutes, if you are able to do this.  2. Commitment to wise food choices. Aim for half of your  food intake to be vegetable and fruit, one quarter starchy foods, and one quarter protein. Try to eat on a regular schedule  3 meals per day, snacking between meals should be limited to vegetables or fruits or small portions of nuts. 64 ounces of water per day is generally recommended, unless you have specific health conditions, like heart failure or kidney failure where you will need to limit fluid intake.  3. Commitment to sufficient and a  good quality of physical and mental rest daily, generally between 6 to 8 hours per day.  WITH PERSISTANCE AND PERSEVERANCE, THE IMPOSSIBLE , BECOMES THE NORM! Thank you  for choosing [facility]. We consider it a privelige to serve you.  Delivering excellent health care in a caring and  compassionate way is our goal.  Partnering with you,  so that together we can achieve this goal is our strategy.

## 2016-11-05 ENCOUNTER — Encounter: Payer: Self-pay | Admitting: Family Medicine

## 2016-11-07 ENCOUNTER — Encounter: Payer: Self-pay | Admitting: Family Medicine

## 2016-11-07 DIAGNOSIS — M549 Dorsalgia, unspecified: Secondary | ICD-10-CM | POA: Insufficient documentation

## 2016-11-07 NOTE — Assessment & Plan Note (Signed)
Concern over mild left elbow swelling  x ray to further evaluate

## 2016-11-07 NOTE — Progress Notes (Signed)
   Renee Collins     MRN: [id number]      DOB: December 20, 1940   HPI Renee Collins is here for follow up and re-evaluation of chronic medical conditions, medication management and review of any available recent lab and radiology data.  Preventive health is updated, specifically  Cancer screening and Immunization.   Questions or concerns regarding consultations or procedures which the PT has had in the interim are  addressed.continues to follow with hematology The PT denies any adverse reactions to current medications since the last visit.  c/o fatigue, wakes up feeling as though she has had no rest c/o low back pain esp after standing for long periods , non radiating, no urinary symptoms  c/o bony swelling of left elbow, pointed out by phlebotomist, will order x ray  ROS Denies recent fever or chills. Denies sinus pressure, nasal congestion, ear pain or sore throat. Denies chest congestion, productive cough or wheezing. Denies chest pains, palpitations and leg swelling Denies abdominal pain, nausea, vomiting,diarrhea or constipation.   Denies dysuria, frequency, hesitancy or incontinence.  Denies headaches, seizures, numbness, or tingling. Denies depression, anxiety or insomnia. Denies skin break down or rash.   PE  BP 120/70   Pulse 98   Resp 16   Ht 5' 4.5" (1.638 m)   Wt 195 lb (88.5 kg)   SpO2 97%   BMI 32.95 kg/m   Patient alert and oriented and in no cardiopulmonary distress.  HEENT: No facial asymmetry, EOMI,   oropharynx pink and moist.  Neck supple no JVD, no mass.  Chest: Clear to auscultation bilaterally.  CVS: S1, S2 no murmurs, no S3.Regular rate.  ABD: Soft non tender.   Ext: No edema  MS: Adequate though reduced  ROM spine, shoulders, hips and knees. Left elbow mildly deformed , non tender  Skin: Intact, no ulcerations or rash noted.  Psych: Good eye contact, normal affect. Memory intact not anxious or depressed appearing.  CNS: CN 2-12 intact, power,  normal  throughout.no focal deficits noted.   Assessment & Plan  Chronic fatigue Chronic fatigue , awakens not rested, and possibly snores, needs sleep study, will refer  Hypothyroidism Controlled, no change in medication   Essential hypertension Controlled, no change in medication DASH diet and commitment to daily physical activity for a minimum of 30 minutes discussed and encouraged, as a part of hypertension management. The importance of attaining a healthy weight is also discussed.  BP/Weight 11/04/2016 10/25/2016 10/08/2016 06/10/2016 06/07/2016 05/28/2016 09/16/2246  Systolic BP 250 037 048 889 169 450 388  Diastolic BP 70 80 68 58 82 68 80  Wt. (Lbs) 195 194.08 195 196.9 195 196.5 194.6  BMI 32.95 32.8 33.47 32.77 32.45 32.7 32.38       Dyslipidemia Hyperlipidemia:Low fat diet discussed and encouraged.   Lipid Panel  Lab Results  Component Value Date   CHOL 145 11/02/2016   HDL 61 11/02/2016   LDLCALC 71 11/02/2016   TRIG 67 11/02/2016   CHOLHDL 2.4 11/02/2016   Controlled, no change in medication     Type 2 diabetes mellitus, controlled Controlled on diet, continue same   Backache Low back pain , no alarming red flag symptoms, history and exam consistent with osteoarthritis , no imaging studies at this time, suggest tylenol if needed, good back posture and back exercises , with weight loss, will follow up  Elbow swelling, left Concern over mild left elbow swelling  x ray to further evaluate

## 2016-11-07 NOTE — Assessment & Plan Note (Signed)
Low back pain , no alarming red flag symptoms, history and exam consistent with osteoarthritis , no imaging studies at this time, suggest tylenol if needed, good back posture and back exercises , with weight loss, will follow up

## 2016-11-07 NOTE — Assessment & Plan Note (Signed)
Controlled, no change in medication DASH diet and commitment to daily physical activity for a minimum of 30 minutes discussed and encouraged, as a part of hypertension management. The importance of attaining a healthy weight is also discussed.  BP/Weight 11/04/2016 10/25/2016 10/08/2016 06/10/2016 06/07/2016 05/28/2016 5/45/6256  Systolic BP 389 373 428 768 115 726 203  Diastolic BP 70 80 68 58 82 68 80  Wt. (Lbs) 195 194.08 195 196.9 195 196.5 194.6  BMI 32.95 32.8 33.47 32.77 32.45 32.7 32.38

## 2016-11-07 NOTE — Assessment & Plan Note (Signed)
Hyperlipidemia:Low fat diet discussed and encouraged.   Lipid Panel  Lab Results  Component Value Date   CHOL 145 11/02/2016   HDL 61 11/02/2016   LDLCALC 71 11/02/2016   TRIG 67 11/02/2016   CHOLHDL 2.4 11/02/2016   Controlled, no change in medication

## 2016-11-07 NOTE — Assessment & Plan Note (Signed)
Controlled on diet, continue same

## 2016-11-07 NOTE — Assessment & Plan Note (Signed)
Controlled, no change in medication  

## 2017-01-07 ENCOUNTER — Telehealth: Payer: Self-pay | Admitting: Family Medicine

## 2017-01-07 NOTE — Telephone Encounter (Signed)
New Message  Pt voiced she has cyst under left arm and would like for MD-Simpson to look at it.  Advised pt MD-Simpson is out until Monday but I can send a message to nurse for nurse to return her call.  Please f/u

## 2017-01-11 ENCOUNTER — Ambulatory Visit (INDEPENDENT_AMBULATORY_CARE_PROVIDER_SITE_OTHER): Payer: Medicare HMO | Admitting: Family Medicine

## 2017-01-11 ENCOUNTER — Encounter: Payer: Self-pay | Admitting: Family Medicine

## 2017-01-11 VITALS — BP 120/72 | HR 96 | Resp 16 | Ht 65.0 in | Wt 197.0 lb

## 2017-01-11 DIAGNOSIS — E118 Type 2 diabetes mellitus with unspecified complications: Secondary | ICD-10-CM | POA: Diagnosis not present

## 2017-01-11 DIAGNOSIS — L02419 Cutaneous abscess of limb, unspecified: Secondary | ICD-10-CM

## 2017-01-11 DIAGNOSIS — I10 Essential (primary) hypertension: Secondary | ICD-10-CM

## 2017-01-11 NOTE — Patient Instructions (Signed)
F/u as before, call if you need me sooner  Abc ess under left arm is almost completely better,just use topical antibiotic ointment twice daily for an additional 5 days  Thanks for choosing [facility], we consider it a privelige to serve you.   Please work on good  health habits so that your health will improve. 1. Commitment to daily physical activity for 30 to 60  minutes, if you are able to do this.  2. Commitment to wise food choices. Aim for half of your  food intake to be vegetable and fruit, one quarter starchy foods, and one quarter protein. Try to eat on a regular schedule  3 meals per day, snacking between meals should be limited to vegetables or fruits or small portions of nuts. 64 ounces of water per day is generally recommended, unless you have specific health conditions, like heart failure or kidney failure where you will need to limit fluid intake.  3. Commitment to sufficient and a  good quality of physical and mental rest daily, generally between 6 to 8 hours per day.  WITH PERSISTANCE AND PERSEVERANCE, THE IMPOSSIBLE , BECOMES THE NORM!

## 2017-01-12 ENCOUNTER — Other Ambulatory Visit (HOSPITAL_COMMUNITY)
Admission: RE | Admit: 2017-01-12 | Discharge: 2017-01-12 | Disposition: A | Discharge status: Home / Self Care | Payer: Medicare HMO | Source: Other Acute Inpatient Hospital | Attending: Family Medicine | Admitting: Family Medicine

## 2017-01-12 DIAGNOSIS — E118 Type 2 diabetes mellitus with unspecified complications: Secondary | ICD-10-CM | POA: Insufficient documentation

## 2017-01-16 DIAGNOSIS — L02419 Cutaneous abscess of limb, unspecified: Secondary | ICD-10-CM | POA: Insufficient documentation

## 2017-01-16 NOTE — Assessment & Plan Note (Signed)
Controlled, no change in medication DASH diet and commitment to daily physical activity for a minimum of 30 minutes discussed and encouraged, as a part of hypertension management. The importance of attaining a healthy weight is also discussed.  BP/Weight 01/11/2017 11/04/2016 10/25/2016 10/08/2016 06/10/2016 06/07/2016 25/02/5637  Systolic BP 756 433 295 188 416 606 301  Diastolic BP 72 70 80 68 58 82 68  Wt. (Lbs) 197 195 194.08 195 196.9 195 196.5  BMI 32.78 32.95 32.8 33.47 32.77 32.45 32.7

## 2017-01-16 NOTE — Progress Notes (Signed)
   MELESSA COWELL     MRN: [id number]      DOB: May 30, 1941   HPI Ms. Abbasi is here with a 2 week h/o purulent draining abcess under left armpit, much improved, however , hard area , non painful, still present , so she wants this checked. Denies fever, chills and does not shave  ROS See HPI recent fever or chills. Denies sinus pressure, nasal congestion, ear pain or sore throat. Denies chest congestion, productive cough or wheezing. Denies chest pains, palpitations and leg swelling Denies abdominal pain, nausea, vomiting,diarrhea or constipation.   Denies dysuria, frequency, hesitancy or incontinence. Denies joint pain, swelling and limitation in mobility.  PE  BP 120/72   Pulse 96   Resp 16   Ht 5\' 5"  (1.651 m)   Wt 197 lb (89.4 kg)   SpO2 94%   BMI 32.78 kg/m   Patient alert and oriented and in no cardiopulmonary distress.  HEENT: No facial asymmetry, EOMI,   oropharynx pink and moist.  Neck supple no JVD, no mass.  Chest: Clear to auscultation bilaterally.  CVS: S1, S2 no murmurs, no S3.Regular rate.  ABD: Soft non tender.   Ext: No edema  MS: Adequate ROM spine, shoulders, hips and knees.  Skin: Intact, dime sized non tender nodule present in left axilla, non tender, no erythema, unable to extrude any purulent drainage.  Psych: Good eye contact, normal affect. Memory intact not anxious or depressed appearing.  CNS: CN 2-12 intact, power,  normal throughout.no focal deficits noted.   Assessment & Plan  Axillary abscess Drained spontaneously almost entirely, topical antibiotic only for an additional 5 days, pt reassured  Essential hypertension Controlled, no change in medication DASH diet and commitment to daily physical activity for a minimum of 30 minutes discussed and encouraged, as a part of hypertension management. The importance of attaining a healthy weight is also discussed.  BP/Weight 01/11/2017 11/04/2016 10/25/2016 10/08/2016 06/10/2016 06/07/2016  97/09/1636  Systolic BP 453 646 803 212 248 250 037  Diastolic BP 72 70 80 68 58 82 68  Wt. (Lbs) 197 195 194.08 195 196.9 195 196.5  BMI 32.78 32.95 32.8 33.47 32.77 32.45 32.7

## 2017-01-16 NOTE — Assessment & Plan Note (Signed)
Drained spontaneously almost entirely, topical antibiotic only for an additional 5 days, pt reassured

## 2017-02-07 ENCOUNTER — Encounter (HOSPITAL_BASED_OUTPATIENT_CLINIC_OR_DEPARTMENT_OTHER): Payer: Medicare HMO | Admitting: Oncology

## 2017-02-07 ENCOUNTER — Encounter (HOSPITAL_COMMUNITY): Payer: Medicare HMO | Attending: Oncology

## 2017-02-07 ENCOUNTER — Encounter (HOSPITAL_COMMUNITY): Payer: Self-pay | Admitting: Oncology

## 2017-02-07 VITALS — BP 133/72 | HR 79 | Resp 16 | Ht 64.0 in | Wt 196.0 lb

## 2017-02-07 DIAGNOSIS — R5383 Other fatigue: Secondary | ICD-10-CM

## 2017-02-07 DIAGNOSIS — F329 Major depressive disorder, single episode, unspecified: Secondary | ICD-10-CM | POA: Insufficient documentation

## 2017-02-07 DIAGNOSIS — M549 Dorsalgia, unspecified: Secondary | ICD-10-CM | POA: Diagnosis not present

## 2017-02-07 DIAGNOSIS — E119 Type 2 diabetes mellitus without complications: Secondary | ICD-10-CM | POA: Diagnosis not present

## 2017-02-07 DIAGNOSIS — D649 Anemia, unspecified: Secondary | ICD-10-CM | POA: Diagnosis not present

## 2017-02-07 DIAGNOSIS — D631 Anemia in chronic kidney disease: Secondary | ICD-10-CM

## 2017-02-07 DIAGNOSIS — K219 Gastro-esophageal reflux disease without esophagitis: Secondary | ICD-10-CM | POA: Insufficient documentation

## 2017-02-07 DIAGNOSIS — Z90710 Acquired absence of both cervix and uterus: Secondary | ICD-10-CM | POA: Diagnosis not present

## 2017-02-07 DIAGNOSIS — M545 Low back pain: Secondary | ICD-10-CM

## 2017-02-07 DIAGNOSIS — E785 Hyperlipidemia, unspecified: Secondary | ICD-10-CM | POA: Insufficient documentation

## 2017-02-07 DIAGNOSIS — D472 Monoclonal gammopathy: Secondary | ICD-10-CM

## 2017-02-07 DIAGNOSIS — N183 Chronic kidney disease, stage 3 unspecified: Secondary | ICD-10-CM

## 2017-02-07 DIAGNOSIS — Z7982 Long term (current) use of aspirin: Secondary | ICD-10-CM | POA: Insufficient documentation

## 2017-02-07 DIAGNOSIS — E039 Hypothyroidism, unspecified: Secondary | ICD-10-CM | POA: Insufficient documentation

## 2017-02-07 DIAGNOSIS — I10 Essential (primary) hypertension: Secondary | ICD-10-CM | POA: Insufficient documentation

## 2017-02-07 DIAGNOSIS — G8929 Other chronic pain: Secondary | ICD-10-CM | POA: Diagnosis not present

## 2017-02-07 LAB — COMPREHENSIVE METABOLIC PANEL
ALT: 24 U/L (ref 14–54)
ANION GAP: 8 (ref 5–15)
AST: 29 U/L (ref 15–41)
Albumin: 3.9 g/dL (ref 3.5–5.0)
Alkaline Phosphatase: 60 U/L (ref 38–126)
BILIRUBIN TOTAL: 0.4 mg/dL (ref 0.3–1.2)
BUN: 25 mg/dL — AB (ref 6–20)
CHLORIDE: 108 mmol/L (ref 101–111)
CO2: 22 mmol/L (ref 22–32)
Calcium: 9.4 mg/dL (ref 8.9–10.3)
Creatinine, Ser: 1.44 mg/dL — ABNORMAL HIGH (ref 0.44–1.00)
GFR calc Af Amer: 39 mL/min — ABNORMAL LOW (ref >60)
GFR calc non Af Amer: 34 mL/min — ABNORMAL LOW (ref >60)
GLUCOSE: 104 mg/dL — AB (ref 65–99)
POTASSIUM: 3.5 mmol/L (ref 3.5–5.1)
SODIUM: 138 mmol/L (ref 135–145)
TOTAL PROTEIN: 9.1 g/dL — AB (ref 6.5–8.1)

## 2017-02-07 LAB — CBC WITH DIFFERENTIAL/PLATELET
BASOS ABS: 0 10*3/uL (ref 0.0–0.1)
BASOS PCT: 0 %
EOS ABS: 0.2 10*3/uL (ref 0.0–0.7)
Eosinophils Relative: 4 %
HEMATOCRIT: 33.3 % — AB (ref 36.0–46.0)
Hemoglobin: 11 g/dL — ABNORMAL LOW (ref 12.0–15.0)
Lymphocytes Relative: 43 %
Lymphs Abs: 2 10*3/uL (ref 0.7–4.0)
MCH: 27.6 pg (ref 26.0–34.0)
MCHC: 33 g/dL (ref 30.0–36.0)
MCV: 83.7 fL (ref 78.0–100.0)
MONO ABS: 0.3 10*3/uL (ref 0.1–1.0)
MONOS PCT: 7 %
NEUTROS ABS: 2.2 10*3/uL (ref 1.7–7.7)
NEUTROS PCT: 46 %
PLATELETS: 212 10*3/uL (ref 150–400)
RBC: 3.98 MIL/uL (ref 3.87–5.11)
RDW: 13.4 % (ref 11.5–15.5)
WBC: 4.8 10*3/uL (ref 4.0–10.5)

## 2017-02-07 LAB — IRON AND TIBC
Iron: 89 ug/dL (ref 28–170)
SATURATION RATIOS: 31 % (ref 10.4–31.8)
TIBC: 284 ug/dL (ref 250–450)
UIBC: 195 ug/dL

## 2017-02-07 LAB — VITAMIN B12: Vitamin B-12: 312 pg/mL (ref 180–914)

## 2017-02-07 LAB — FERRITIN: FERRITIN: 122 ng/mL (ref 11–307)

## 2017-02-07 NOTE — Progress Notes (Signed)
[place], 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 Donald Pore last note on 05/28/16)     INTERVAL HISTORY:  Renee Collins 76 y.o. female presents for continued follow-up for MGUS.   She states that she has been doing well and has no complaints today. She denies any chest pain, shortness breath, abdominal pain, focal weakness. She states she has arthritis in her low back which causes her pain but otherwise denies any bone pain.   REVIEW OF SYSTEMS:  Review of Systems  Constitutional: Negative for chills, fatigue and fever.  HENT:  Negative.  Negative for lump/mass and nosebleeds.   Eyes: Negative.   Respiratory: Negative.  Negative for cough and shortness of breath.   Cardiovascular: Negative.  Negative for chest pain and leg swelling.  Gastrointestinal: Negative.  Negative for abdominal pain, blood in stool, constipation, diarrhea, nausea and vomiting.  Endocrine: Negative.   Genitourinary: Negative.  Negative for dysuria and hematuria.   Musculoskeletal: Negative.  Negative for arthralgias.  Skin: Negative.  Negative for rash.  Neurological: Negative.  Negative for dizziness and headaches.  Hematological: Negative.  Negative for adenopathy. Does not bruise/bleed easily.  Psychiatric/Behavioral: Negative.  Negative for depression and sleep disturbance. The patient is not nervous/anxious.      PAST MEDICAL/SURGICAL HISTORY:  Past Medical History:  Diagnosis Date  . Anemia   . Chronic back pain   . Depression   . Diabetes mellitus   . GERD (gastroesophageal reflux disease)   . Hyperlipidemia   . Hypertension   . Hypothyroidism    Past Surgical History:    Procedure Laterality Date  . BONE MARROW ASPIRATION Left 05/16/15  . BONE MARROW BIOPSY Left 05/16/15  . COLONOSCOPY  03/30/2012   Procedure: COLONOSCOPY;  Surgeon: Rogene Houston, MD;  Location: [facility];  Service: Endoscopy;  Laterality: N/A;  730  . LAMINECTOMY  1980's  . SPINE SURGERY  approx 1983   dr Joya Salm  . TOTAL ABDOMINAL HYSTERECTOMY  approx 1993   fibroids      SOCIAL HISTORY:  Social History   Social History  . Marital status: Divorced    Spouse name: N/A  . Number of children: 3  . Years of education: N/A   Occupational History  . LPN    Social History Main Topics  . Smoking status: Never Smoker  . Smokeless tobacco: Never Used  . Alcohol use No  . Drug use: No  . Sexual activity: Not Currently   Other Topics Concern  . Not on file   Social History Narrative   Pt has 2 living adult children, 1 desease at age 56 secondary congential heart disease     FAMILY HISTORY:  Family History  Problem Relation Age of Onset  . Heart attack Mother 43  . Heart disease Mother   . Cancer Mother        type unknown  . Diabetes Mother   . Kidney failure Father   . Kidney disease Father   . Myasthenia gravis Brother   . Diabetes Sister   . Breast cancer Sister 61  . Heart disease Brother   . Diabetes Sister   . Sickle cell anemia Sister   .  Cancer Sister        unknown type    CURRENT MEDICATIONS:  Outpatient Encounter Prescriptions as of 02/07/2017  Medication Sig Note  . acetaminophen (TYLENOL) 500 MG tablet Take 500 mg by mouth every 6 (six) hours as needed (doesn't take often).   Marland Kitchen amLODipine (NORVASC) 10 MG tablet TAKE 1 TABLET EVERY DAY   . aspirin (ASPIRIN ADULT LOW STRENGTH) 81 MG EC tablet Take 81 mg by mouth daily.     . benazepril (LOTENSIN) 20 MG tablet TAKE 1 TABLET EVERY DAY   . Calcium Carbonate-Vit D-Min (CALCIUM 1200) 1200-1000 MG-UNIT CHEW Chew 1 tablet by mouth 2 (two) times daily.     . diphenhydrAMINE (BENADRYL) 25 mg capsule  Take 25 mg by mouth at bedtime as needed.   . docusate sodium (COLACE) 50 MG capsule Take 50 mg by mouth daily as needed for mild constipation.   . gabapentin (NEURONTIN) 100 MG capsule Take 100 mg by mouth 3 (three) times daily as needed.   . hydrochlorothiazide (HYDRODIURIL) 25 MG tablet TAKE 1 TABLET EVERY DAY   . levothyroxine (SYNTHROID, LEVOTHROID) 50 MCG tablet TAKE 1 AND 1/2 TABLETS ONCE DAILY 30 MINUTES TO 1 HOUR BEFORE BREAKFAST ON AN EMPTY STOMACH   . Multiple Vitamins-Minerals (ONE-A-DAY EXTRAS ANTIOXIDANT) CAPS Take 1 capsule by mouth daily.    Marland Kitchen omeprazole (PRILOSEC) 20 MG capsule Take 20 mg by mouth daily. 05/30/2015: Takes as needed  . pravastatin (PRAVACHOL) 20 MG tablet TAKE 1 TABLET EVERY EVENING   . Simethicone 125 MG CAPS Take 1 capsule by mouth daily as needed.   . [DISCONTINUED] Ferrous Sulfate (IRON) 28 MG TABS Take 1 tablet by mouth daily.    No facility-administered encounter medications on file as of 02/07/2017.     ALLERGIES:  No Known Allergies   PHYSICAL EXAM:  ECOG Performance status: 1 - Symptomatic, but independent.   Vital signs reviewed.  Physical Exam  Constitutional: She is oriented to person, place, and time and well-developed, well-nourished, and in no distress.  HENT:  Head: Normocephalic.  Mouth/Throat: Oropharynx is clear and moist. No oropharyngeal exudate.  Eyes: Pupils are equal, round, and reactive to light. Conjunctivae are normal. No scleral icterus.  Neck: Normal range of motion. Neck supple.  Cardiovascular: Normal rate, regular rhythm and normal heart sounds.   Pulmonary/Chest: Effort normal and breath sounds normal. No respiratory distress.  Abdominal: Soft. Bowel sounds are normal. There is no tenderness.  Musculoskeletal: Normal range of motion. She exhibits no edema.  Lymphadenopathy:    She has no cervical adenopathy.       Right: No supraclavicular adenopathy present.       Left: No supraclavicular adenopathy present.   Neurological: She is alert and oriented to person, place, and time. No cranial nerve deficit. Gait normal.  Skin: Skin is warm and dry. No rash noted.  Psychiatric: Mood, memory, affect and judgment normal.  Nursing note and vitals reviewed.    LABORATORY DATA:  I have reviewed the labs as listed.  CBC    Component Value Date/Time   WBC 4.8 02/07/2017 1341   RBC 3.98 02/07/2017 1341   HGB 11.0 (L) 02/07/2017 1341   HCT 33.3 (L) 02/07/2017 1341   PLT 212 02/07/2017 1341   MCV 83.7 02/07/2017 1341   MCH 27.6 02/07/2017 1341   MCHC 33.0 02/07/2017 1341   RDW 13.4 02/07/2017 1341   LYMPHSABS 2.0 02/07/2017 1341   MONOABS 0.3 02/07/2017 1341   EOSABS 0.2 02/07/2017  1341   BASOSABS 0.0 02/07/2017 1341   CMP Latest Ref Rng & Units 02/07/2017 11/02/2016 10/08/2016  Glucose 65 - 99 mg/dL 104(H) 108(H) 123(H)  BUN 6 - 20 mg/dL 25(H) 26(H) 25(H)  Creatinine 0.44 - 1.00 mg/dL 1.44(H) 1.28(H) 1.40(H)  Sodium 135 - 145 mmol/L 138 138 138  Potassium 3.5 - 5.1 mmol/L 3.5 3.9 3.8  Chloride 101 - 111 mmol/L 108 106 111  CO2 22 - 32 mmol/L 22 24 21(L)  Calcium 8.9 - 10.3 mg/dL 9.4 9.6 9.3  Total Protein 6.5 - 8.1 g/dL 9.1(H) 8.6(H) 9.0(H)  Total Bilirubin 0.3 - 1.2 mg/dL 0.4 0.4 0.6  Alkaline Phos 38 - 126 U/L 60 56 58  AST 15 - 41 U/L '29 22 27  '$ ALT 14 - 54 U/L '24 17 23    '$ PENDING LABS:  SPEP, IgG, IgM, IgA, Kappa/Lambda light chains pending.   DIAGNOSTIC IMAGING:    PATHOLOGY:  Bone marrow biopsy: 05/16/15       ASSESSMENT & PLAN:   MGUS:  -SPEP results pending for today. Her M-spike has been stable in the 2.2 g/dl range upon review of her last 2 SPEPs. -Clinically she is doing well. Mild increase in her creatinine. No evidence of hypercalcemia  -No indication of progression to symptomatic myeloma and the need for treatment.  -Of note, her previous bone marrow biopsy done in 04/2015. She understands that we may need to repeat bone marrow biopsy in the future and if we do,  then we would refer to [facility] for image-guided bone marrow biopsy.  We will continue with observation for now.  -Return to cancer center in 6 months with labs.   Anemia:  -Likely due to chronic kidney disease.  -Hgb stable in the 11 g/dL range. Platelets and WBCs normal. No intervention at this time; we will continue to monitor.  -Fecal occult blood cards negative in 05/2016.    Dispo:  -Return to cancer center in 6 months with labs.    All questions were answered to patient's stated satisfaction. Encouraged patient to call with any new concerns or questions before her next visit to the cancer center and we can certain see her sooner, if needed.       Orders placed this encounter:  Orders Placed This Encounter  Procedures  . CBC with Differential  . Comprehensive metabolic panel  . Kappa/lambda light chains  . Multiple Myeloma Panel (SPEP&IFE w/QIG)  . Beta 2 microglobuline, serum   Twana First, MD

## 2017-02-08 LAB — IGG, IGA, IGM
IgA: 75 mg/dL (ref 64–422)
IgG (Immunoglobin G), Serum: 3141 mg/dL — ABNORMAL HIGH (ref 700–1600)
IgM, Serum: 64 mg/dL (ref 26–217)

## 2017-02-08 LAB — PROTEIN ELECTROPHORESIS, SERUM
A/G Ratio: 0.8 (ref 0.7–1.7)
ALBUMIN ELP: 4 g/dL (ref 2.9–4.4)
ALPHA-1-GLOBULIN: 0.3 g/dL (ref 0.0–0.4)
Alpha-2-Globulin: 1 g/dL (ref 0.4–1.0)
BETA GLOBULIN: 1 g/dL (ref 0.7–1.3)
GAMMA GLOBULIN: 2.6 g/dL — AB (ref 0.4–1.8)
Globulin, Total: 4.8 g/dL — ABNORMAL HIGH (ref 2.2–3.9)
M-Spike, %: 2.3 g/dL — ABNORMAL HIGH
Total Protein ELP: 8.8 g/dL — ABNORMAL HIGH (ref 6.0–8.5)

## 2017-02-08 LAB — KAPPA/LAMBDA LIGHT CHAINS
KAPPA FREE LGHT CHN: 164.8 mg/L — AB (ref 3.3–19.4)
KAPPA, LAMDA LIGHT CHAIN RATIO: 10.05 — AB (ref 0.26–1.65)
LAMDA FREE LIGHT CHAINS: 16.4 mg/L (ref 5.7–26.3)

## 2017-02-08 LAB — FOLATE: Folate: 39.4 ng/mL (ref >5.9)

## 2017-02-09 LAB — IMMUNOFIXATION ELECTROPHORESIS
IgA: 75 mg/dL (ref 64–422)
IgG (Immunoglobin G), Serum: 3216 mg/dL — ABNORMAL HIGH (ref 700–1600)
IgM (Immunoglobulin M), Srm: 66 mg/dL (ref 26–217)
Total Protein ELP: 8.6 g/dL — ABNORMAL HIGH (ref 6.0–8.5)

## 2017-02-11 ENCOUNTER — Other Ambulatory Visit (HOSPITAL_COMMUNITY): Payer: Self-pay | Admitting: Adult Health

## 2017-02-11 DIAGNOSIS — D472 Monoclonal gammopathy: Secondary | ICD-10-CM

## 2017-02-22 ENCOUNTER — Ambulatory Visit (HOSPITAL_COMMUNITY)
Admission: RE | Admit: 2017-02-22 | Discharge: 2017-02-22 | Disposition: A | Discharge status: Home / Self Care | Payer: Medicare HMO | Source: Ambulatory Visit | Attending: Adult Health | Admitting: Adult Health

## 2017-02-22 DIAGNOSIS — M8580 Other specified disorders of bone density and structure, unspecified site: Secondary | ICD-10-CM | POA: Diagnosis not present

## 2017-02-22 DIAGNOSIS — D472 Monoclonal gammopathy: Secondary | ICD-10-CM | POA: Insufficient documentation

## 2017-02-22 DIAGNOSIS — K449 Diaphragmatic hernia without obstruction or gangrene: Secondary | ICD-10-CM | POA: Diagnosis not present

## 2017-02-22 DIAGNOSIS — I6523 Occlusion and stenosis of bilateral carotid arteries: Secondary | ICD-10-CM | POA: Diagnosis not present

## 2017-02-22 DIAGNOSIS — J9811 Atelectasis: Secondary | ICD-10-CM | POA: Insufficient documentation

## 2017-02-22 DIAGNOSIS — I739 Peripheral vascular disease, unspecified: Secondary | ICD-10-CM | POA: Diagnosis not present

## 2017-02-23 ENCOUNTER — Other Ambulatory Visit (HOSPITAL_COMMUNITY): Payer: Self-pay | Admitting: Adult Health

## 2017-02-23 DIAGNOSIS — D472 Monoclonal gammopathy: Secondary | ICD-10-CM

## 2017-02-23 DIAGNOSIS — R936 Abnormal findings on diagnostic imaging of limbs: Secondary | ICD-10-CM

## 2017-02-24 ENCOUNTER — Other Ambulatory Visit (HOSPITAL_COMMUNITY): Payer: Self-pay

## 2017-02-24 DIAGNOSIS — D472 Monoclonal gammopathy: Secondary | ICD-10-CM

## 2017-02-24 DIAGNOSIS — R936 Abnormal findings on diagnostic imaging of limbs: Secondary | ICD-10-CM

## 2017-03-02 ENCOUNTER — Ambulatory Visit (HOSPITAL_COMMUNITY)
Admission: RE | Admit: 2017-03-02 | Discharge: 2017-03-02 | Disposition: A | Discharge status: Home / Self Care | Payer: Medicare HMO | Source: Ambulatory Visit | Attending: Adult Health | Admitting: Adult Health

## 2017-03-02 DIAGNOSIS — G5721 Lesion of femoral nerve, right lower limb: Secondary | ICD-10-CM | POA: Diagnosis not present

## 2017-03-02 DIAGNOSIS — D472 Monoclonal gammopathy: Secondary | ICD-10-CM | POA: Diagnosis not present

## 2017-03-02 DIAGNOSIS — R936 Abnormal findings on diagnostic imaging of limbs: Secondary | ICD-10-CM

## 2017-03-02 MED ORDER — GADOBENATE DIMEGLUMINE 529 MG/ML IV SOLN
9.0000 mL | Freq: Once | INTRAVENOUS | Status: AC | PRN
  Administered 2017-03-02: 9 mL via INTRAVENOUS

## 2017-03-03 ENCOUNTER — Other Ambulatory Visit (HOSPITAL_COMMUNITY): Payer: Self-pay | Admitting: Adult Health

## 2017-03-03 DIAGNOSIS — D472 Monoclonal gammopathy: Secondary | ICD-10-CM

## 2017-03-03 DIAGNOSIS — R9389 Abnormal findings on diagnostic imaging of other specified body structures: Secondary | ICD-10-CM

## 2017-03-08 ENCOUNTER — Other Ambulatory Visit: Payer: Self-pay | Admitting: Family Medicine

## 2017-03-22 ENCOUNTER — Other Ambulatory Visit: Payer: Self-pay | Admitting: Family Medicine

## 2017-03-22 ENCOUNTER — Ambulatory Visit (INDEPENDENT_AMBULATORY_CARE_PROVIDER_SITE_OTHER): Payer: Medicare HMO

## 2017-03-22 DIAGNOSIS — Z23 Encounter for immunization: Secondary | ICD-10-CM | POA: Diagnosis not present

## 2017-03-22 MED ORDER — PRAVASTATIN SODIUM 20 MG PO TABS: 20.0000 mg | ORAL_TABLET | Freq: Every evening | ORAL | 0 refills | Status: DC

## 2017-04-19 ENCOUNTER — Other Ambulatory Visit: Payer: Self-pay | Admitting: Family Medicine

## 2017-04-19 DIAGNOSIS — Z1231 Encounter for screening mammogram for malignant neoplasm of breast: Secondary | ICD-10-CM

## 2017-05-03 ENCOUNTER — Other Ambulatory Visit: Payer: Self-pay | Admitting: Nurse Practitioner

## 2017-05-13 ENCOUNTER — Ambulatory Visit (HOSPITAL_COMMUNITY)
Admission: RE | Admit: 2017-05-13 | Discharge: 2017-05-13 | Disposition: A | Discharge status: Home / Self Care | Payer: Medicare HMO | Source: Ambulatory Visit | Attending: Family Medicine | Admitting: Family Medicine

## 2017-05-13 DIAGNOSIS — Z1231 Encounter for screening mammogram for malignant neoplasm of breast: Secondary | ICD-10-CM

## 2017-05-30 DIAGNOSIS — H524 Presbyopia: Secondary | ICD-10-CM | POA: Diagnosis not present

## 2017-05-30 LAB — HM DIABETES EYE EXAM

## 2017-05-31 ENCOUNTER — Encounter: Payer: Self-pay | Admitting: Family Medicine

## 2017-06-01 ENCOUNTER — Telehealth: Payer: Self-pay

## 2017-06-01 DIAGNOSIS — I10 Essential (primary) hypertension: Secondary | ICD-10-CM

## 2017-06-01 DIAGNOSIS — E118 Type 2 diabetes mellitus with unspecified complications: Secondary | ICD-10-CM | POA: Diagnosis not present

## 2017-06-01 DIAGNOSIS — E785 Hyperlipidemia, unspecified: Secondary | ICD-10-CM

## 2017-06-01 DIAGNOSIS — E039 Hypothyroidism, unspecified: Secondary | ICD-10-CM | POA: Diagnosis not present

## 2017-06-01 DIAGNOSIS — E038 Other specified hypothyroidism: Secondary | ICD-10-CM

## 2017-06-01 NOTE — Telephone Encounter (Signed)
Lab order sent

## 2017-06-03 LAB — LIPID PANEL
CHOL/HDL RATIO: 2.5 (calc) (ref <5.0)
Cholesterol: 154 mg/dL (ref <200)
HDL: 61 mg/dL (ref >50)
LDL Cholesterol (Calc): 76 mg/dL (calc)
NON-HDL CHOLESTEROL (CALC): 93 mg/dL (ref <130)
TRIGLYCERIDES: 87 mg/dL (ref <150)

## 2017-06-03 LAB — COMPLETE METABOLIC PANEL WITH GFR
AG Ratio: 0.8 (calc) — ABNORMAL LOW (ref 1.0–2.5)
ALBUMIN MSPROF: 4.1 g/dL (ref 3.6–5.1)
ALKALINE PHOSPHATASE (APISO): 68 U/L (ref 33–130)
ALT: 26 U/L (ref 6–29)
AST: 27 U/L (ref 10–35)
BILIRUBIN TOTAL: 0.3 mg/dL (ref 0.2–1.2)
BUN / CREAT RATIO: 19 (calc) (ref 6–22)
BUN: 25 mg/dL (ref 7–25)
CO2: 26 mmol/L (ref 20–32)
CREATININE: 1.29 mg/dL — AB (ref 0.60–0.93)
Calcium: 9.6 mg/dL (ref 8.6–10.4)
Chloride: 106 mmol/L (ref 98–110)
GFR, EST AFRICAN AMERICAN: 46 mL/min/{1.73_m2} — AB (ref >=60)
GFR, Est Non African American: 40 mL/min/{1.73_m2} — ABNORMAL LOW (ref >=60)
GLOBULIN: 4.9 g/dL — AB (ref 1.9–3.7)
Glucose, Bld: 108 mg/dL — ABNORMAL HIGH (ref 65–99)
Potassium: 4.1 mmol/L (ref 3.5–5.3)
SODIUM: 137 mmol/L (ref 135–146)
TOTAL PROTEIN: 9 g/dL — AB (ref 6.1–8.1)

## 2017-06-03 LAB — CBC
HEMATOCRIT: 34.3 % — AB (ref 35.0–45.0)
HEMOGLOBIN: 11.3 g/dL — AB (ref 11.7–15.5)
MCH: 27.2 pg (ref 27.0–33.0)
MCHC: 32.9 g/dL (ref 32.0–36.0)
MCV: 82.5 fL (ref 80.0–100.0)
MPV: 10.3 fL (ref 7.5–12.5)
Platelets: 278 10*3/uL (ref 140–400)
RBC: 4.16 10*6/uL (ref 3.80–5.10)
RDW: 12.6 % (ref 11.0–15.0)
WBC: 5.2 10*3/uL (ref 3.8–10.8)

## 2017-06-03 LAB — TSH: TSH: 2.6 mIU/L (ref 0.40–4.50)

## 2017-06-03 LAB — HEMOGLOBIN A1C
EAG (MMOL/L): 7.9 (calc)
Hgb A1c MFr Bld: 6.6 % of total Hgb — ABNORMAL HIGH (ref <5.7)
Mean Plasma Glucose: 143 (calc)

## 2017-06-09 ENCOUNTER — Ambulatory Visit (INDEPENDENT_AMBULATORY_CARE_PROVIDER_SITE_OTHER): Payer: Medicare HMO | Admitting: Family Medicine

## 2017-06-09 ENCOUNTER — Encounter: Payer: Self-pay | Admitting: Family Medicine

## 2017-06-09 ENCOUNTER — Other Ambulatory Visit (HOSPITAL_COMMUNITY)
Admission: AD | Admit: 2017-06-09 | Discharge: 2017-06-09 | Disposition: A | Discharge status: Home / Self Care | Payer: Medicare HMO | Source: Skilled Nursing Facility | Attending: *Deleted | Admitting: *Deleted

## 2017-06-09 ENCOUNTER — Ambulatory Visit (HOSPITAL_COMMUNITY)
Admission: RE | Admit: 2017-06-09 | Discharge: 2017-06-09 | Disposition: A | Discharge status: Home / Self Care | Payer: Medicare HMO | Source: Ambulatory Visit | Attending: Family Medicine | Admitting: Family Medicine

## 2017-06-09 VITALS — BP 110/70 | HR 96 | Resp 14 | Ht 65.0 in | Wt 199.0 lb

## 2017-06-09 DIAGNOSIS — R1030 Lower abdominal pain, unspecified: Secondary | ICD-10-CM | POA: Diagnosis not present

## 2017-06-09 DIAGNOSIS — E118 Type 2 diabetes mellitus with unspecified complications: Secondary | ICD-10-CM | POA: Diagnosis not present

## 2017-06-09 DIAGNOSIS — R1084 Generalized abdominal pain: Secondary | ICD-10-CM

## 2017-06-09 DIAGNOSIS — E785 Hyperlipidemia, unspecified: Secondary | ICD-10-CM

## 2017-06-09 DIAGNOSIS — Z0000 Encounter for general adult medical examination without abnormal findings: Secondary | ICD-10-CM

## 2017-06-09 DIAGNOSIS — Z1211 Encounter for screening for malignant neoplasm of colon: Secondary | ICD-10-CM

## 2017-06-09 DIAGNOSIS — R195 Other fecal abnormalities: Secondary | ICD-10-CM

## 2017-06-09 LAB — POC HEMOCCULT BLD/STL (OFFICE/1-CARD/DIAGNOSTIC): Fecal Occult Blood, POC: POSITIVE — AB

## 2017-06-09 NOTE — Patient Instructions (Addendum)
F/u in 5 months, call if you need me sooner  Microalb today  You are referred to Dr Laural Golden because of blood in your stool  Please get abdominal x ray today because of your c/o lower abdominal discomfort x 1 month and  The fact that you have hidden blood in your stool  Good foot exam today  Work on food choice and commit to daily physical activity for health, 30 minutes, look into silver sneakers  Labs are good overall, cholesterol is good, kidney function is better,, but blood sugar has increased, you will reverse this  Non fast chem, 7 and RGFR and HBA1C  4 days before next appt It is important that you exercise regularly at least 30 minutes 5 times a week. If you develop chest pain, have severe difficulty breathing, or feel very tired, stop exercising immediately and seek medical attention  Please work on good  health habits so that your health will improve. 1. Commitment to daily physical activity for 30 to 60  minutes, if you are able to do this.  2. Commitment to wise food choices. Aim for half of your  food intake to be vegetable and fruit, one quarter starchy foods, and one quarter protein. Try to eat on a regular schedule  3 meals per day, snacking between meals should be limited to vegetables or fruits or small portions of nuts. 64 ounces of water per day is generally recommended, unless you have specific health conditions, like heart failure or kidney failure where you will need to limit fluid intake.  3. Commitment to sufficient and a  good quality of physical and mental rest daily, generally between 6 to 8 hours per day.  WITH PERSISTANCE AND PERSEVERANCE, THE IMPOSSIBLE , BECOMES THE NORM! Thank you  for choosing [facility]. We consider it a privelige to serve you.  Delivering excellent health care in a caring and  compassionate way is our goal.  Partnering with you,  so that together we can achieve this goal is our strategy.

## 2017-06-10 ENCOUNTER — Encounter: Payer: Self-pay | Admitting: Family Medicine

## 2017-06-10 LAB — MICROALBUMIN / CREATININE URINE RATIO
CREATININE, UR: 152.5 mg/dL
MICROALB UR: 174.7 ug/mL — AB
MICROALB/CREAT RATIO: 114.6 mg/g{creat} — AB (ref 0.0–30.0)

## 2017-06-12 ENCOUNTER — Encounter: Payer: Self-pay | Admitting: Family Medicine

## 2017-06-12 DIAGNOSIS — R1030 Lower abdominal pain, unspecified: Secondary | ICD-10-CM | POA: Insufficient documentation

## 2017-06-12 DIAGNOSIS — R195 Other fecal abnormalities: Secondary | ICD-10-CM | POA: Insufficient documentation

## 2017-06-12 NOTE — Assessment & Plan Note (Signed)

## 2017-06-12 NOTE — Assessment & Plan Note (Signed)
One month h/o lower abdominal pain, no h/o change in stool caliber , stool is heme positive, hough she has hemorrhoids, no recent h/p visible blood in stool or straining at stool,  abdominal x ray shows no abnormality, refer to GI

## 2017-06-12 NOTE — Assessment & Plan Note (Signed)
[name] is reminded of the importance of commitment to daily physical activity for 30 minutes or more, as able and the need to limit carbohydrate intake to 30 to 60 grams per meal to help with blood sugar control.   The need to take medication as prescribed, test blood sugar as directed, and to call between visits if there is a concern that blood sugar is uncontrolled is also discussed.   [name] is reminded of the importance of daily foot exam, annual eye examination, and good blood sugar, blood pressure and cholesterol control.  Diabetic Labs Latest Ref Rng & Units 06/09/2017 06/01/2017 02/07/2017 11/02/2016 10/08/2016  HbA1c <5.7 % of total Hgb - 6.6(H) - 6.2(H) -  Microalbumin Not Estab. ug/mL 174.7(H) - - - -  Micro/Creat Ratio 0.0 - 30.0 mg/g creat 114.6(H) - - - -  Chol <200 mg/dL - 154 - 145 -  HDL >50 mg/dL - 61 - 61 -  Calc LDL <100 mg/dL - - - 71 -  Triglycerides <150 mg/dL - 87 - 67 -  Creatinine 0.60 - 0.93 mg/dL - 1.29(H) 1.44(H) 1.28(H) 1.40(H)   BP/Weight 06/09/2017 02/07/2017 01/11/2017 11/04/2016 10/25/2016 10/08/2016 07/86/7544  Systolic BP 920 100 712 197 588 325 498  Diastolic BP 70 72 72 70 80 68 58  Wt. (Lbs) 199 196 197 195 194.08 195 196.9  BMI 33.12 33.64 32.78 32.95 32.8 33.47 32.77   Foot/eye exam completion dates Latest Ref Rng & Units 06/09/2017 06/07/2016  Eye Exam No Retinopathy - -  Foot Form Completion - Done Done

## 2017-06-12 NOTE — Progress Notes (Signed)
Renee Collins     MRN: [id number]      DOB: May 17, 1941  HPI: Patient is in for annual physical exam. 1 month h/o lower abdominal heaviness and discomfort, denies change in stool caliber or frequency Recent labs, if available are reviewed. Immunization is reviewed , and  updated if needed.   PE: BP 110/70   Pulse 96   Resp 14   Ht 5\' 5"  (1.651 m)   Wt 199 lb (90.3 kg)   SpO2 98%   BMI 33.12 kg/m    Pleasant  female, alert and oriented x 3, in no cardio-pulmonary distress. Afebrile. HEENT No facial trauma or asymetry. Sinuses non tender.  Extra occullar muscles intact, External ears normal, tympanic membranes clear. Oropharynx moist, no exudate. Neck: supple, no adenopathy,JVD or thyromegaly.No bruits.  Chest: Clear to ascultation bilaterally.No crackles or wheezes. Non tender to palpation  Breast: No asymetry,no masses or lumps. No tenderness. No nipple discharge or inversion. No axillary or supraclavicular adenopathy  Cardiovascular system; Heart sounds normal,  S1 and  S2 ,no S3.  No murmur, or thrill. Apical beat not displaced Peripheral pulses normal.  Abdomen: Soft, lower abdominal tenderness, no guarding or rebound, no organomegaly or masses. No bruits. Bowel sounds normal. No guarding,  or rebound.  Rectal:  Normal sphincter tone. External hemmorhoid. Guaiac positive  stool.  GU: Nott examined .   Musculoskeletal exam: Decreased though adequate ROM of spine, hips , shoulders and knees. No deformity ,swelling or crepitus noted. No muscle wasting or atrophy.   Neurologic: Cranial nerves 2 to 12 intact. Power, tone ,sensation and reflexes normal throughout. No disturbance in gait. No tremor.  Skin: Intact, no ulceration, erythema , scaling or rash noted. Pigmentation normal throughout  Psych; Normal mood and affect. Judgement and concentration normal   Assessment & Plan:  Annual physical exam Annual exam as documented. Counseling  done  re healthy lifestyle involving commitment to 150 minutes exercise per week, heart healthy diet, and attaining healthy weight.The importance of adequate sleep also discussed. Regular seat belt use and home safety, is also discussed. Changes in health habits are decided on by the patient with goals and time frames  set for achieving them. Immunization and cancer screening needs are specifically addressed at this visit.   Type 2 diabetes mellitus, controlled Renee Collins is reminded of the importance of commitment to daily physical activity for 30 minutes or more, as able and the need to limit carbohydrate intake to 30 to 60 grams per meal to help with blood sugar control.   The need to take medication as prescribed, test blood sugar as directed, and to call between visits if there is a concern that blood sugar is uncontrolled is also discussed.   Renee Collins is reminded of the importance of daily foot exam, annual eye examination, and good blood sugar, blood pressure and cholesterol control.  Diabetic Labs Latest Ref Rng & Units 06/09/2017 06/01/2017 02/07/2017 11/02/2016 10/08/2016  HbA1c <5.7 % of total Hgb - 6.6(H) - 6.2(H) -  Microalbumin Not Estab. ug/mL 174.7(H) - - - -  Micro/Creat Ratio 0.0 - 30.0 mg/g creat 114.6(H) - - - -  Chol <200 mg/dL - 154 - 145 -  HDL >50 mg/dL - 61 - 61 -  Calc LDL <100 mg/dL - - - 71 -  Triglycerides <150 mg/dL - 87 - 67 -  Creatinine 0.60 - 0.93 mg/dL - 1.29(H) 1.44(H) 1.28(H) 1.40(H)   BP/Weight 06/09/2017 02/07/2017 01/11/2017 11/04/2016 10/25/2016  10/08/2016 20/25/4270  Systolic BP 623 762 831 517 616 073 710  Diastolic BP 70 72 72 70 80 68 58  Wt. (Lbs) 199 196 197 195 194.08 195 196.9  BMI 33.12 33.64 32.78 32.95 32.8 33.47 32.77   Foot/eye exam completion dates Latest Ref Rng & Units 06/09/2017 06/07/2016  Eye Exam No Retinopathy - -  Foot Form Completion - Done Done        Lower abdominal pain One month h/o lower abdominal pain, no h/o change in  stool caliber , stool is heme positive, hough she has hemorrhoids, no recent h/p visible blood in stool or straining at stool,  abdominal x ray shows no abnormality, refer to GI  Heme positive stool 1 month h/o lower abdominal pian and heme positive stool on exam , refer to GI

## 2017-06-12 NOTE — Assessment & Plan Note (Signed)
1 month h/o lower abdominal pian and heme positive stool on exam , refer to GI

## 2017-06-24 ENCOUNTER — Encounter (INDEPENDENT_AMBULATORY_CARE_PROVIDER_SITE_OTHER): Payer: Self-pay | Admitting: Internal Medicine

## 2017-06-24 ENCOUNTER — Encounter (INDEPENDENT_AMBULATORY_CARE_PROVIDER_SITE_OTHER): Payer: Self-pay | Admitting: *Deleted

## 2017-06-24 ENCOUNTER — Ambulatory Visit (INDEPENDENT_AMBULATORY_CARE_PROVIDER_SITE_OTHER): Payer: Medicare HMO | Admitting: Internal Medicine

## 2017-06-24 ENCOUNTER — Telehealth (INDEPENDENT_AMBULATORY_CARE_PROVIDER_SITE_OTHER): Payer: Self-pay | Admitting: *Deleted

## 2017-06-24 VITALS — BP 134/68 | HR 76 | Temp 98.1°F | Ht 65.0 in | Wt 198.8 lb

## 2017-06-24 DIAGNOSIS — K625 Hemorrhage of anus and rectum: Secondary | ICD-10-CM

## 2017-06-24 MED ORDER — PEG 3350-KCL-NA BICARB-NACL 420 G PO SOLR: 4000.0000 mL | Freq: Once | ORAL | 0 refills | Status: AC

## 2017-06-24 NOTE — Patient Instructions (Signed)
Colonoscopy. The risks of bleeding, perforation and infection were reviewed with patient.  

## 2017-06-24 NOTE — Telephone Encounter (Signed)
Patient needs trilyte 

## 2017-06-24 NOTE — Progress Notes (Signed)
Subjective:    Patient ID: Renee Collins, female    DOB: 27-Dec-1940, 76 y.o.   MRN: [id number]  HPI Referred by Dr. Moshe Cipro for a positive stool card. She denies seeing any blood.  Her last colonoscopy was in 2013. She says she is dong okay. There has been no change in her stools. She takes Miralax for constipation. No abdominal pain . Seen last year for a rectal prolapse. She was referred to Dr. Leighton Ruff but did not follow.   No family hx of colon cancer.   03/30/2012 Colonoscopy: screening, Dr. Laural Golden.  Impression:  Examination performed to cecum. Mild sigmoid colon diverticulosis. Small polyp ablated via cold biopsy from sigmoid colon. Melanosis coli. External and internal hemorrhoids.  Polyp hyperplastic. Results given to patient. She may consider other exam in 10 years if she continues to enjoy good health   Review of Systems Past Medical History:  Diagnosis Date  . Anemia   . Chronic back pain   . Depression   . Diabetes mellitus   . GERD (gastroesophageal reflux disease)   . Hyperlipidemia   . Hypertension   . Hypothyroidism     Past Surgical History:  Procedure Laterality Date  . BONE MARROW ASPIRATION Left 05/16/15  . BONE MARROW BIOPSY Left 05/16/15  . COLONOSCOPY  03/30/2012   Procedure: COLONOSCOPY;  Surgeon: Rogene Houston, MD;  Location: [facility];  Service: Endoscopy;  Laterality: N/A;  730  . LAMINECTOMY  1980's  . SPINE SURGERY  approx 1983   dr Joya Salm  . TOTAL ABDOMINAL HYSTERECTOMY  approx 1993   fibroids     No Known Allergies  Current Outpatient Medications on File Prior to Visit  Medication Sig Dispense Refill  . acetaminophen (TYLENOL) 500 MG tablet Take 500 mg by mouth every 6 (six) hours as needed (doesn't take often).    Marland Kitchen amLODipine (NORVASC) 10 MG tablet TAKE 1 TABLET EVERY DAY 90 tablet 1  . aspirin (ASPIRIN ADULT LOW STRENGTH) 81 MG EC tablet Take 81 mg by mouth daily.      . benazepril (LOTENSIN) 20 MG tablet TAKE 1  TABLET EVERY DAY 90 tablet 1  . Calcium Carbonate-Vit D-Min (CALCIUM 1200) 1200-1000 MG-UNIT CHEW Chew 1 tablet by mouth 2 (two) times daily.      . diphenhydrAMINE (BENADRYL) 25 mg capsule Take 25 mg by mouth at bedtime as needed.    . docusate sodium (COLACE) 50 MG capsule Take 50 mg by mouth daily as needed for mild constipation.    . gabapentin (NEURONTIN) 100 MG capsule Take 100 mg by mouth 3 (three) times daily as needed.    . hydrochlorothiazide (HYDRODIURIL) 25 MG tablet TAKE 1 TABLET EVERY DAY 90 tablet 1  . levothyroxine (SYNTHROID, LEVOTHROID) 50 MCG tablet TAKE 1 AND 1/2 TABLETS ONCE DAILY 30 MINUTES TO 1 HOUR BEFORE BREAKFAST ON AN EMPTY STOMACH 135 tablet 1  . Multiple Vitamins-Minerals (ONE-A-DAY EXTRAS ANTIOXIDANT) CAPS Take 1 capsule by mouth daily.     Marland Kitchen omeprazole (PRILOSEC) 20 MG capsule Take 20 mg by mouth daily.    . pravastatin (PRAVACHOL) 20 MG tablet Take 1 tablet (20 mg total) by mouth every evening. 90 tablet 0  . Simethicone 125 MG CAPS Take 1 capsule by mouth daily as needed.     No current facility-administered medications on file prior to visit.         Objective:   Physical Exam Blood pressure 134/68, pulse 76, temperature 98.1 F (36.7  C), height 5' 5" (1.651 m), weight 198 lb 12.8 oz (90.2 kg). Alert and oriented. Skin warm and dry. Oral mucosa is moist.   . Sclera anicteric, conjunctivae is pink. Thyroid not enlarged. No cervical lymphadenopathy. Lungs clear. Heart regular rate and rhythm.  Abdomen is soft. Bowel sounds are positive. No hepatomegaly. No abdominal masses felt. No tenderness.  No edema to lower extremities. Patient has a rectal prolapse. No rectal masses felt.        Assessment & Plan:  Heme positive stool. Will set up for a colonoscopy to rule out colonic neoplasm. Rectal prolapse.

## 2017-06-30 ENCOUNTER — Encounter (HOSPITAL_COMMUNITY): Payer: Self-pay | Admitting: *Deleted

## 2017-06-30 ENCOUNTER — Ambulatory Visit (HOSPITAL_COMMUNITY)
Admission: RE | Admit: 2017-06-30 | Discharge: 2017-06-30 | Disposition: A | Discharge status: Home / Self Care | Payer: Medicare HMO | Source: Ambulatory Visit | Attending: Internal Medicine | Admitting: Internal Medicine

## 2017-06-30 ENCOUNTER — Encounter (HOSPITAL_COMMUNITY)
Admission: RE | Disposition: A | Discharge status: Home / Self Care | Payer: Self-pay | Source: Ambulatory Visit | Attending: Internal Medicine

## 2017-06-30 DIAGNOSIS — D122 Benign neoplasm of ascending colon: Secondary | ICD-10-CM

## 2017-06-30 DIAGNOSIS — K648 Other hemorrhoids: Secondary | ICD-10-CM

## 2017-06-30 DIAGNOSIS — E119 Type 2 diabetes mellitus without complications: Secondary | ICD-10-CM | POA: Diagnosis not present

## 2017-06-30 DIAGNOSIS — K5730 Diverticulosis of large intestine without perforation or abscess without bleeding: Secondary | ICD-10-CM | POA: Insufficient documentation

## 2017-06-30 DIAGNOSIS — F329 Major depressive disorder, single episode, unspecified: Secondary | ICD-10-CM | POA: Insufficient documentation

## 2017-06-30 DIAGNOSIS — K219 Gastro-esophageal reflux disease without esophagitis: Secondary | ICD-10-CM | POA: Insufficient documentation

## 2017-06-30 DIAGNOSIS — K625 Hemorrhage of anus and rectum: Secondary | ICD-10-CM

## 2017-06-30 DIAGNOSIS — Z79899 Other long term (current) drug therapy: Secondary | ICD-10-CM | POA: Diagnosis not present

## 2017-06-30 DIAGNOSIS — Z7982 Long term (current) use of aspirin: Secondary | ICD-10-CM | POA: Diagnosis not present

## 2017-06-30 DIAGNOSIS — G8929 Other chronic pain: Secondary | ICD-10-CM | POA: Insufficient documentation

## 2017-06-30 DIAGNOSIS — E785 Hyperlipidemia, unspecified: Secondary | ICD-10-CM | POA: Diagnosis not present

## 2017-06-30 DIAGNOSIS — R195 Other fecal abnormalities: Secondary | ICD-10-CM

## 2017-06-30 DIAGNOSIS — E039 Hypothyroidism, unspecified: Secondary | ICD-10-CM | POA: Insufficient documentation

## 2017-06-30 DIAGNOSIS — K921 Melena: Secondary | ICD-10-CM | POA: Diagnosis not present

## 2017-06-30 DIAGNOSIS — I10 Essential (primary) hypertension: Secondary | ICD-10-CM | POA: Insufficient documentation

## 2017-06-30 HISTORY — PX: COLONOSCOPY: SHX5424

## 2017-06-30 HISTORY — PX: POLYPECTOMY: SHX5525

## 2017-06-30 SURGERY — COLONOSCOPY
Anesthesia: Moderate Sedation

## 2017-06-30 MED ORDER — MEPERIDINE HCL 50 MG/ML IJ SOLN
INTRAMUSCULAR | Status: AC
  Filled 2017-06-30: qty 1

## 2017-06-30 MED ORDER — MIDAZOLAM HCL 5 MG/5ML IJ SOLN
INTRAMUSCULAR | Status: DC | PRN
  Administered 2017-06-30 (×3): 1 mg via INTRAVENOUS
  Administered 2017-06-30: 2 mg via INTRAVENOUS

## 2017-06-30 MED ORDER — MEPERIDINE HCL 50 MG/ML IJ SOLN
INTRAMUSCULAR | Status: DC | PRN
  Administered 2017-06-30 (×2): 25 mg via INTRAVENOUS

## 2017-06-30 MED ORDER — MIDAZOLAM HCL 5 MG/5ML IJ SOLN
INTRAMUSCULAR | Status: AC
  Filled 2017-06-30: qty 10

## 2017-06-30 MED ORDER — SODIUM CHLORIDE 0.9 % IV SOLN
INTRAVENOUS | Status: DC
  Administered 2017-06-30: 13:00:00 via INTRAVENOUS

## 2017-06-30 MED ORDER — STERILE WATER FOR IRRIGATION IR SOLN
Status: DC | PRN
  Administered 2017-06-30: 14:00:00

## 2017-06-30 NOTE — Discharge Instructions (Signed)
Resume aspirin on 07/01/2017 Resume usual medications as before. High-fiber diet. No driving for 24 hours. Physician will call with biopsy results.   Colonoscopy, Adult, Care After This sheet gives you information about how to care for yourself after your procedure. Your health care provider may also give you more specific instructions. If you have problems or questions, contact your health care provider. What can I expect after the procedure? After the procedure, it is common to have:  A small amount of blood in your stool for 24 hours after the procedure.  Some gas.  Mild abdominal cramping or bloating.  Follow these instructions at home: General instructions   For the first 24 hours after the procedure: ? Do not drive or use machinery. ? Do not sign important documents. ? Do not drink alcohol. ? Do your regular daily activities at a slower pace than normal. ? Eat soft, easy-to-digest foods. ? Rest often.  Take over-the-counter or prescription medicines only as told by your health care provider.  It is up to you to get the results of your procedure. Ask your health care provider, or the department performing the procedure, when your results will be ready. Relieving cramping and bloating  Try walking around when you have cramps or feel bloated.  Apply heat to your abdomen as told by your health care provider. Use a heat source that your health care provider recommends, such as a moist heat pack or a heating pad. ? Place a towel between your skin and the heat source. ? Leave the heat on for 20-30 minutes. ? Remove the heat if your skin turns bright red. This is especially important if you are unable to feel pain, heat, or cold. You may have a greater risk of getting burned. Eating and drinking  Drink enough fluid to keep your urine clear or pale yellow.  Resume your normal diet as instructed by your health care provider. Avoid heavy or fried foods that are hard to  digest.  Avoid drinking alcohol for as long as instructed by your health care provider. Contact a health care provider if:  You have blood in your stool 2-3 days after the procedure. Get help right away if:  You have more than a small spotting of blood in your stool.  You pass large blood clots in your stool.  Your abdomen is swollen.  You have nausea or vomiting.  You have a fever.  You have increasing abdominal pain that is not relieved with medicine. This information is not intended to replace advice given to you by your health care provider. Make sure you discuss any questions you have with your health care provider. Document Released: 01/27/2004 Document Revised: 03/08/2016 Document Reviewed: 08/26/2015 Elsevier Interactive Patient Education  2018 [employer].   Diverticulosis Diverticulosis is a condition that develops when small pouches (diverticula) form in the wall of the large intestine (colon). The colon is where water is absorbed and stool is formed. The pouches form when the inside layer of the colon pushes through weak spots in the outer layers of the colon. You may have a few pouches or many of them. What are the causes? The cause of this condition is not known. What increases the risk? The following factors may make you more likely to develop this condition:  Being older than age 75. Your risk for this condition increases with age. Diverticulosis is rare among people younger than age 29. By age 17, many people have it.  Eating a low-fiber  diet.  Having frequent constipation.  Being overweight.  Not getting enough exercise.  Smoking.  Taking over-the-counter pain medicines, like aspirin and ibuprofen.  Having a family history of diverticulosis.  What are the signs or symptoms? In most people, there are no symptoms of this condition. If you do have symptoms, they may include:  Bloating.  Cramps in the abdomen.  Constipation or diarrhea.  Pain in  the lower left side of the abdomen.  How is this diagnosed? This condition is most often diagnosed during an exam for other colon problems. Because diverticulosis usually has no symptoms, it often cannot be diagnosed independently. This condition may be diagnosed by:  Using a flexible scope to examine the colon (colonoscopy).  Taking an X-ray of the colon after dye has been put into the colon (barium enema).  Doing a CT scan.  How is this treated? You may not need treatment for this condition if you have never developed an infection related to diverticulosis. If you have had an infection before, treatment may include:  Eating a high-fiber diet. This may include eating more fruits, vegetables, and grains.  Taking a fiber supplement.  Taking a live bacteria supplement (probiotic).  Taking medicine to relax your colon.  Taking antibiotic medicines.  Follow these instructions at home:  Drink 6-8 glasses of water or more each day to prevent constipation.  Try not to strain when you have a bowel movement.  If you have had an infection before: ? Eat more fiber as directed by your health care provider or your diet and nutrition specialist (dietitian). ? Take a fiber supplement or probiotic, if your health care provider approves.  Take over-the-counter and prescription medicines only as told by your health care provider.  If you were prescribed an antibiotic, take it as told by your health care provider. Do not stop taking the antibiotic even if you start to feel better.  Keep all follow-up visits as told by your health care provider. This is important. Contact a health care provider if:  You have pain in your abdomen.  You have bloating.  You have cramps.  You have not had a bowel movement in 3 days. Get help right away if:  Your pain gets worse.  Your bloating becomes very bad.  You have a fever or chills, and your symptoms suddenly get worse.  You vomit.  You have  bowel movements that are bloody or black.  You have bleeding from your rectum. Summary  Diverticulosis is a condition that develops when small pouches (diverticula) form in the wall of the large intestine (colon).  You may have a few pouches or many of them.  This condition is most often diagnosed during an exam for other colon problems.  If you have had an infection related to diverticulosis, treatment may include increasing the fiber in your diet, taking supplements, or taking medicines. This information is not intended to replace advice given to you by your health care provider. Make sure you discuss any questions you have with your health care provider. Document Released: 03/11/2004 Document Revised: 05/03/2016 Document Reviewed: 05/03/2016 Elsevier Interactive Patient Education  2017 [employer].    High-Fiber Diet Fiber, also called dietary fiber, is a type of carbohydrate found in fruits, vegetables, whole grains, and beans. A high-fiber diet can have many health benefits. Your health care provider may recommend a high-fiber diet to help:  Prevent constipation. Fiber can make your bowel movements more regular.  Lower your cholesterol.  Relieve  hemorrhoids, uncomplicated diverticulosis, or irritable bowel syndrome.  Prevent overeating as part of a weight-loss plan.  Prevent heart disease, type 2 diabetes, and certain cancers.  What is my plan? The recommended daily intake of fiber includes:  38 grams for men under age 21.  18 grams for men over age 26.  56 grams for women under age 14.  5 grams for women over age 46.  You can get the recommended daily intake of dietary fiber by eating a variety of fruits, vegetables, grains, and beans. Your health care provider may also recommend a fiber supplement if it is not possible to get enough fiber through your diet. What do I need to know about a high-fiber diet?  Fiber supplements have not been widely studied for their  effectiveness, so it is better to get fiber through food sources.  Always check the fiber content on thenutrition facts label of any prepackaged food. Look for foods that contain at least 5 grams of fiber per serving.  Ask your dietitian if you have questions about specific foods that are related to your condition, especially if those foods are not listed in the following section.  Increase your daily fiber consumption gradually. Increasing your intake of dietary fiber too quickly may cause bloating, cramping, or gas.  Drink plenty of water. Water helps you to digest fiber. What foods can I eat? Grains Whole-grain breads. Multigrain cereal. Oats and oatmeal. Brown rice. Barley. Bulgur wheat. Stella. Bran muffins. Popcorn. Rye wafer crackers. Vegetables Sweet potatoes. Spinach. Kale. Artichokes. Cabbage. Broccoli. Green peas. Carrots. Squash. Fruits Berries. Pears. Apples. Oranges. Avocados. Prunes and raisins. Dried figs. Meats and Other Protein Sources Navy, kidney, pinto, and soy beans. Split peas. Lentils. Nuts and seeds. Dairy Fiber-fortified yogurt. Beverages Fiber-fortified soy milk. Fiber-fortified orange juice. Other Fiber bars. The items listed above may not be a complete list of recommended foods or beverages. Contact your dietitian for more options. What foods are not recommended? Grains White bread. Pasta made with refined flour. White rice. Vegetables Fried potatoes. Canned vegetables. Well-cooked vegetables. Fruits Fruit juice. Cooked, strained fruit. Meats and Other Protein Sources Fatty cuts of meat. Fried Sales executive or fried fish. Dairy Milk. Yogurt. Cream cheese. Sour cream. Beverages Soft drinks. Other Cakes and pastries. Butter and oils. The items listed above may not be a complete list of foods and beverages to avoid. Contact your dietitian for more information. What are some tips for including high-fiber foods in my diet?  Eat a wide variety of  high-fiber foods.  Make sure that half of all grains consumed each day are whole grains.  Replace breads and cereals made from refined flour or white flour with whole-grain breads and cereals.  Replace white rice with brown rice, bulgur wheat, or millet.  Start the day with a breakfast that is high in fiber, such as a cereal that contains at least 5 grams of fiber per serving.  Use beans in place of meat in soups, salads, or pasta.  Eat high-fiber snacks, such as berries, raw vegetables, nuts, or popcorn. This information is not intended to replace advice given to you by your health care provider. Make sure you discuss any questions you have with your health care provider. Document Released: 06/14/2005 Document Revised: 11/20/2015 Document Reviewed: 11/27/2013 Elsevier Interactive Patient Education  [employer].

## 2017-06-30 NOTE — H&P (Signed)
Renee Collins is an 77 y.o. female.   Chief Complaint: Patient is here for colonoscopy. HPI: Patient is 77 year old F American who was noted to have heme positive stool.  She denies abdominal pain change in bowel habits melena or rectal bleeding.  She is on low-dose aspirin.  Last colonoscopy was in October 2013 removal of a small polyp and was hyperplastic. Family history is negative for CRC.   Past Medical History:  Diagnosis Date  . Anemia   . Chronic back pain   . Depression   . Diabetes mellitus   . GERD (gastroesophageal reflux disease)   . Hyperlipidemia   . Hypertension   . Hypothyroidism     Past Surgical History:  Procedure Laterality Date  . BONE MARROW ASPIRATION Left 05/16/15  . BONE MARROW BIOPSY Left 05/16/15  . COLONOSCOPY  03/30/2012   Procedure: COLONOSCOPY;  Surgeon: Rogene Houston, MD;  Location: [facility];  Service: Endoscopy;  Laterality: N/A;  730  . LAMINECTOMY  1980's  . SPINE SURGERY  approx 1983   dr Joya Salm  . TOTAL ABDOMINAL HYSTERECTOMY  approx 1993   fibroids     Family History  Problem Relation Age of Onset  . Heart attack Mother 65  . Heart disease Mother   . Cancer Mother        type unknown  . Diabetes Mother   . Kidney failure Father   . Kidney disease Father   . Myasthenia gravis Brother   . Diabetes Sister   . Breast cancer Sister 74  . Heart disease Brother   . Diabetes Sister   . Sickle cell anemia Sister   . Cancer Sister        unknown type   Social History:  reports that  has never smoked. she has never used smokeless tobacco. She reports that she does not drink alcohol or use drugs.  Allergies: No Known Allergies  Medications Prior to Admission  Medication Sig Dispense Refill  . acetaminophen (TYLENOL 8 HOUR ARTHRITIS PAIN) 650 MG CR tablet Take 650 mg by mouth every 8 (eight) hours as needed for pain.    Marland Kitchen amLODipine (NORVASC) 10 MG tablet TAKE 1 TABLET EVERY DAY 90 tablet 1  . aspirin (ASPIRIN ADULT LOW  STRENGTH) 81 MG EC tablet Take 81 mg by mouth daily.      . benazepril (LOTENSIN) 20 MG tablet TAKE 1 TABLET EVERY DAY 90 tablet 1  . Calcium-Magnesium-Vitamin D (CALCIUM 1200+D3 PO) Take 1 tablet by mouth 2 (two) times daily.    . diphenhydrAMINE (BENADRYL) 25 mg capsule Take 25 mg by mouth at bedtime as needed (for allergies/sleep.).     Marland Kitchen gabapentin (NEURONTIN) 100 MG capsule Take 100-200 mg by mouth at bedtime as needed (FOR PAIN).     . hydrochlorothiazide (HYDRODIURIL) 25 MG tablet TAKE 1 TABLET EVERY DAY 90 tablet 1  . levothyroxine (SYNTHROID, LEVOTHROID) 50 MCG tablet TAKE 1 AND 1/2 TABLETS ONCE DAILY 30 MINUTES TO 1 HOUR BEFORE BREAKFAST ON AN EMPTY STOMACH 135 tablet 1  . Multiple Vitamin (MULTIVITAMIN WITH MINERALS) TABS tablet Take 1 tablet by mouth daily.    Marland Kitchen omeprazole (PRILOSEC) 20 MG capsule Take 20 mg by mouth daily as needed (for acid reflux.).     Marland Kitchen polyethylene glycol powder (GLYCOLAX/MIRALAX) powder Take 17 g by mouth daily as needed (FOR CONSTIPATION.).    Marland Kitchen pravastatin (PRAVACHOL) 20 MG tablet Take 1 tablet (20 mg total) by mouth every evening. 90 tablet  0  . Simethicone 125 MG CAPS Take 1 capsule by mouth daily as needed.      No results found for this or any previous visit (from the past 48 hour(s)). No results found.  ROS  Blood pressure (!) 145/73, pulse 82, temperature 97.9 F (36.6 C), temperature source Oral, resp. rate (!) 21, SpO2 96 %. Physical Exam  Constitutional: She appears well-developed and well-nourished.  HENT:  Mouth/Throat: Oropharynx is clear and moist.  Eyes: Conjunctivae are normal. No scleral icterus.  Neck: No thyromegaly present.  Cardiovascular: Normal rate, regular rhythm and normal heart sounds.  No murmur heard. Respiratory: Effort normal and breath sounds normal.  GI:  Abdomen is symmetrical with lower midline scar. It is soft and nontender without organomegaly or masses.  Musculoskeletal: She exhibits no edema.   Lymphadenopathy:    She has no cervical adenopathy.  Neurological: She is alert.  Skin: Skin is warm and dry.     Assessment/Plan Heme positive stool. Diagnostic colonoscopy.  Hildred Laser, MD 06/30/2017, 1:29 PM

## 2017-06-30 NOTE — Op Note (Signed)
[facility] Patient Name: Renee Collins Procedure Date: 06/30/2017 2:05 PM MRN: [id number] Date of Birth: [birth year] Attending MD: Hildred Laser , MD CSN: [id number] Age: 77 Admit Type: Outpatient Procedure:                Colonoscopy Indications:              Heme positive stool Providers:                Hildred Laser, MD, Gwenlyn Fudge RN, RN, Aram Candela Referring MD:             Norwood Levo. Moshe Cipro, MD Medicines:                Meperidine 50 mg IV, Midazolam 5 mg IV Complications:            No immediate complications. Estimated Blood Loss:     Estimated blood loss was minimal. Procedure:                Pre-Anesthesia Assessment:                           - Prior to the procedure, a History and Physical                            was performed, and patient medications and                            allergies were reviewed. The patient's tolerance of                            previous anesthesia was also reviewed. The risks                            and benefits of the procedure and the sedation                            options and risks were discussed with the patient.                            All questions were answered, and informed consent                            was obtained. Prior Anticoagulants: The patient                            last took aspirin 2 days prior to the procedure.                            ASA Grade Assessment: II - A patient with mild                            systemic disease. After reviewing the risks and                            benefits, the patient was deemed in satisfactory  condition to undergo the procedure.                           After obtaining informed consent, the colonoscope                            was passed under direct vision. Throughout the                            procedure, the patient's blood pressure, pulse, and                            oxygen saturations were monitored continuously.  The                            [id number] ([id number]) scope was introduced through                            the and advanced to the the cecum, identified by                            appendiceal orifice and ileocecal valve. The                            colonoscopy was performed without difficulty. The                            patient tolerated the procedure well. The quality                            of the bowel preparation was good. The ileocecal                            valve, appendiceal orifice, and rectum were                            photographed. Findings:      The perianal and digital rectal examinations were normal.      A small polyp was found in the ascending colon. The polyp was sessile.       Biopsies were taken with a cold forceps for histology.      A few medium-mouthed diverticula were found in the sigmoid colon.      Internal hemorrhoids were found during retroflexion. The hemorrhoids       were small. Impression:               - One small polyp in the ascending colon. Biopsied.                           - Diverticulosis in the sigmoid colon.                           - Internal hemorrhoids. Moderate Sedation:      Moderate (conscious) sedation was administered by the endoscopy nurse       and supervised by the endoscopist. The  following parameters were       monitored: oxygen saturation, heart rate, blood pressure, CO2       capnography and response to care. Total physician intraservice time was       26 minutes. Recommendation:           - Patient has a contact number available for                            emergencies. The signs and symptoms of potential                            delayed complications were discussed with the                            patient. Return to normal activities tomorrow.                            Written discharge instructions were provided to the                            patient.                           - High fiber diet  today.                           - Continue present medications.                           - No aspirin, ibuprofen, naproxen, or other                            non-steroidal anti-inflammatory drugs for 1 day.                           - Await pathology results.                           - No recommendation at this time regarding repeat                            colonoscopy. Procedure Code(s):        --- Professional ---                           [phone], Colonoscopy, flexible; with biopsy, single                            or multiple                           99152, Moderate sedation services provided by the                            same physician or other qualified health care  professional performing the diagnostic or                            therapeutic service that the sedation supports,                            requiring the presence of an independent trained                            observer to assist in the monitoring of the                            patient's level of consciousness and physiological                            status; initial 15 minutes of intraservice time,                            patient age 40 years or older                           [id number], Moderate sedation services; each additional                            15 minutes intraservice time Diagnosis Code(s):        --- Professional ---                           D12.2, Benign neoplasm of ascending colon                           K64.8, Other hemorrhoids                           R19.5, Other fecal abnormalities                           K57.30, Diverticulosis of large intestine without                            perforation or abscess without bleeding CPT copyright 2016 [employer]. All rights reserved. The codes documented in this report are preliminary and upon coder review may  be revised to meet current compliance requirements. Hildred Laser, MD Hildred Laser, MD 06/30/2017 2:10:50 PM This report has been signed electronically. Number of Addenda: 0

## 2017-07-04 ENCOUNTER — Encounter (HOSPITAL_COMMUNITY): Payer: Self-pay | Admitting: Internal Medicine

## 2017-08-09 ENCOUNTER — Other Ambulatory Visit (HOSPITAL_COMMUNITY): Payer: Self-pay | Admitting: *Deleted

## 2017-08-09 DIAGNOSIS — D472 Monoclonal gammopathy: Secondary | ICD-10-CM

## 2017-08-10 ENCOUNTER — Other Ambulatory Visit: Payer: Self-pay

## 2017-08-10 ENCOUNTER — Inpatient Hospital Stay (HOSPITAL_COMMUNITY): Payer: Medicare HMO | Attending: Internal Medicine

## 2017-08-10 ENCOUNTER — Inpatient Hospital Stay (HOSPITAL_BASED_OUTPATIENT_CLINIC_OR_DEPARTMENT_OTHER): Payer: Medicare HMO | Admitting: Adult Health

## 2017-08-10 ENCOUNTER — Encounter (HOSPITAL_COMMUNITY): Payer: Self-pay | Admitting: Adult Health

## 2017-08-10 VITALS — BP 152/73 | HR 99 | Temp 97.6°F | Resp 16 | Wt 200.0 lb

## 2017-08-10 DIAGNOSIS — E785 Hyperlipidemia, unspecified: Secondary | ICD-10-CM

## 2017-08-10 DIAGNOSIS — Z809 Family history of malignant neoplasm, unspecified: Secondary | ICD-10-CM | POA: Diagnosis not present

## 2017-08-10 DIAGNOSIS — E119 Type 2 diabetes mellitus without complications: Secondary | ICD-10-CM | POA: Diagnosis not present

## 2017-08-10 DIAGNOSIS — R5383 Other fatigue: Secondary | ICD-10-CM

## 2017-08-10 DIAGNOSIS — Z7982 Long term (current) use of aspirin: Secondary | ICD-10-CM

## 2017-08-10 DIAGNOSIS — M549 Dorsalgia, unspecified: Secondary | ICD-10-CM | POA: Insufficient documentation

## 2017-08-10 DIAGNOSIS — G8929 Other chronic pain: Secondary | ICD-10-CM | POA: Diagnosis not present

## 2017-08-10 DIAGNOSIS — K219 Gastro-esophageal reflux disease without esophagitis: Secondary | ICD-10-CM | POA: Diagnosis not present

## 2017-08-10 DIAGNOSIS — Z803 Family history of malignant neoplasm of breast: Secondary | ICD-10-CM | POA: Insufficient documentation

## 2017-08-10 DIAGNOSIS — D649 Anemia, unspecified: Secondary | ICD-10-CM | POA: Insufficient documentation

## 2017-08-10 DIAGNOSIS — Z79899 Other long term (current) drug therapy: Secondary | ICD-10-CM

## 2017-08-10 DIAGNOSIS — E039 Hypothyroidism, unspecified: Secondary | ICD-10-CM

## 2017-08-10 DIAGNOSIS — Z86010 Personal history of colonic polyps: Secondary | ICD-10-CM | POA: Diagnosis not present

## 2017-08-10 DIAGNOSIS — D472 Monoclonal gammopathy: Secondary | ICD-10-CM | POA: Diagnosis not present

## 2017-08-10 DIAGNOSIS — I10 Essential (primary) hypertension: Secondary | ICD-10-CM | POA: Diagnosis not present

## 2017-08-10 DIAGNOSIS — D631 Anemia in chronic kidney disease: Secondary | ICD-10-CM

## 2017-08-10 DIAGNOSIS — N183 Chronic kidney disease, stage 3 unspecified: Secondary | ICD-10-CM

## 2017-08-10 LAB — COMPREHENSIVE METABOLIC PANEL
ALK PHOS: 61 U/L (ref 38–126)
ALT: 18 U/L (ref 14–54)
AST: 26 U/L (ref 15–41)
Albumin: 3.9 g/dL (ref 3.5–5.0)
Anion gap: 9 (ref 5–15)
BUN: 24 mg/dL — ABNORMAL HIGH (ref 6–20)
CALCIUM: 9.4 mg/dL (ref 8.9–10.3)
CO2: 22 mmol/L (ref 22–32)
CREATININE: 1.41 mg/dL — AB (ref 0.44–1.00)
Chloride: 105 mmol/L (ref 101–111)
GFR, EST AFRICAN AMERICAN: 40 mL/min — AB (ref >60)
GFR, EST NON AFRICAN AMERICAN: 35 mL/min — AB (ref >60)
Glucose, Bld: 166 mg/dL — ABNORMAL HIGH (ref 65–99)
Potassium: 3.7 mmol/L (ref 3.5–5.1)
Sodium: 136 mmol/L (ref 135–145)
TOTAL PROTEIN: 9.2 g/dL — AB (ref 6.5–8.1)
Total Bilirubin: 0.4 mg/dL (ref 0.3–1.2)

## 2017-08-10 LAB — CBC WITH DIFFERENTIAL/PLATELET
Basophils Absolute: 0 10*3/uL (ref 0.0–0.1)
Basophils Relative: 0 %
EOS PCT: 4 %
Eosinophils Absolute: 0.2 10*3/uL (ref 0.0–0.7)
HCT: 35.4 % — ABNORMAL LOW (ref 36.0–46.0)
HEMOGLOBIN: 11 g/dL — AB (ref 12.0–15.0)
LYMPHS ABS: 1.6 10*3/uL (ref 0.7–4.0)
LYMPHS PCT: 34 %
MCH: 26.8 pg (ref 26.0–34.0)
MCHC: 31.1 g/dL (ref 30.0–36.0)
MCV: 86.3 fL (ref 78.0–100.0)
MONOS PCT: 5 %
Monocytes Absolute: 0.2 10*3/uL (ref 0.1–1.0)
NEUTROS PCT: 57 %
Neutro Abs: 2.6 10*3/uL (ref 1.7–7.7)
Platelets: 219 10*3/uL (ref 150–400)
RBC: 4.1 MIL/uL (ref 3.87–5.11)
RDW: 13.2 % (ref 11.5–15.5)
WBC: 4.6 10*3/uL (ref 4.0–10.5)

## 2017-08-10 NOTE — Progress Notes (Signed)
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 Donald Pore last note on 05/28/16)     INTERVAL HISTORY:  Renee Collins 77 y.o. female presents for continued follow-up for MGUS.   Here today unaccompanied.  Overall she tells me she has not been feeling pretty well.  Appetite 75%; energy level is 50%.  Largely her only complaint is fatigue.  She attributes some of this to helping care for a few sick family members.  Chart reviewed.  She had hemoccult positive stools at her PCP's office. She did undergo recent colonoscopy with Dr. Laural Golden on 06/30/17.  One polyp was removed; path revealed tubular adenoma with no evidence of malignancy.  Denies any frank bleeding episodes that she is aware of including blood in her stools, dark/tarry stools, hematuria, vaginal bleeding, hemoptysis, hematochezia, nosebleeds, or gingival bleeding.   She denies any focal bone pain.  She tries to drink plenty of water to stay hydrated.  She sees her PCP regularly.  Reportedly her mammogram is up-to-date.  Otherwise she is largely without complaints today.    REVIEW OF SYSTEMS:  Review of Systems  Constitutional: Positive for fatigue. Negative for chills and fever.  HENT:  Negative.  Negative for lump/mass and nosebleeds.   Eyes: Negative.   Respiratory: Negative.  Negative for cough and shortness of breath.   Cardiovascular: Negative.  Negative for chest pain and leg swelling.  Gastrointestinal: Negative.  Negative for abdominal pain, blood in stool, constipation, diarrhea, nausea and vomiting.  Endocrine: Negative.   Genitourinary: Negative.  Negative for dysuria and hematuria.   Musculoskeletal:  Negative.  Negative for arthralgias.  Skin: Negative.  Negative for rash.  Neurological: Negative.  Negative for dizziness and headaches.  Hematological: Negative.  Negative for adenopathy. Does not bruise/bleed easily.  Psychiatric/Behavioral: Negative.  Negative for depression and sleep disturbance. The patient is not nervous/anxious.      PAST MEDICAL/SURGICAL HISTORY:  Past Medical History:  Diagnosis Date  . Anemia   . Chronic back pain   . Depression   . Diabetes mellitus   . GERD (gastroesophageal reflux disease)   . Hyperlipidemia   . Hypertension   . Hypothyroidism    Past Surgical History:  Procedure Laterality Date  . BONE MARROW ASPIRATION Left 05/16/15  . BONE MARROW BIOPSY Left 05/16/15  . COLONOSCOPY  03/30/2012   Procedure: COLONOSCOPY;  Surgeon: Rogene Houston, MD;  [facility];  Service: Endoscopy;  Laterality: N/A;  730  . COLONOSCOPY N/A 06/30/2017   Procedure: COLONOSCOPY;  Surgeon: Rogene Houston, MD;  Location: [facility];  Service: Endoscopy;  Laterality: N/A;  2:00  . LAMINECTOMY  1980's  . POLYPECTOMY  06/30/2017   Procedure: POLYPECTOMY;  Surgeon: Rogene Houston, MD;  Location: [facility];  Service: Endoscopy;;  colon  . SPINE SURGERY  approx 1983   dr Joya Salm  . TOTAL ABDOMINAL HYSTERECTOMY  approx 1993   fibroids      SOCIAL HISTORY:  Social History   Socioeconomic History  . Marital status: Divorced    Spouse name: Not on file  . Number of children: 3  . Years of education: Not on file  .  Highest education level: Not on file  Social Needs  . Financial resource strain: Not on file  . Food insecurity - worry: Not on file  . Food insecurity - inability: Not on file  . Transportation needs - medical: Not on file  . Transportation needs - non-medical: Not on file  Occupational History  . Occupation: LPN  Tobacco Use  . Smoking status: Never Smoker  . Smokeless tobacco: Never Used  Substance and Sexual Activity  .  Alcohol use: No  . Drug use: No  . Sexual activity: Not Currently  Other Topics Concern  . Not on file  Social History Narrative   Pt has 2 living adult children, 1 desease at age 21 secondary congential heart disease     FAMILY HISTORY:  Family History  Problem Relation Age of Onset  . Heart attack Mother 53  . Heart disease Mother   . Cancer Mother        type unknown  . Diabetes Mother   . Kidney failure Father   . Kidney disease Father   . Myasthenia gravis Brother   . Diabetes Sister   . Breast cancer Sister 52  . Heart disease Brother   . Diabetes Sister   . Sickle cell anemia Sister   . Cancer Sister        unknown type    CURRENT MEDICATIONS:  Outpatient Encounter Medications as of 08/10/2017  Medication Sig  . acetaminophen (TYLENOL 8 HOUR ARTHRITIS PAIN) 650 MG CR tablet Take 650 mg by mouth every 8 (eight) hours as needed for pain.  Marland Kitchen amLODipine (NORVASC) 10 MG tablet TAKE 1 TABLET EVERY DAY  . aspirin (ASPIRIN ADULT LOW STRENGTH) 81 MG EC tablet Take 1 tablet (81 mg total) by mouth daily.  . benazepril (LOTENSIN) 20 MG tablet TAKE 1 TABLET EVERY DAY  . Calcium-Magnesium-Vitamin D (CALCIUM 1200+D3 PO) Take 1 tablet by mouth 2 (two) times daily.  . diphenhydrAMINE (BENADRYL) 25 mg capsule Take 25 mg by mouth at bedtime as needed (for allergies/sleep.).   Marland Kitchen gabapentin (NEURONTIN) 100 MG capsule Take 100-200 mg by mouth at bedtime as needed (FOR PAIN).   . hydrochlorothiazide (HYDRODIURIL) 25 MG tablet TAKE 1 TABLET EVERY DAY  . levothyroxine (SYNTHROID, LEVOTHROID) 50 MCG tablet TAKE 1 AND 1/2 TABLETS ONCE DAILY 30 MINUTES TO 1 HOUR BEFORE BREAKFAST ON AN EMPTY STOMACH  . Multiple Vitamin (MULTIVITAMIN WITH MINERALS) TABS tablet Take 1 tablet by mouth daily.  Marland Kitchen omeprazole (PRILOSEC) 20 MG capsule Take 20 mg by mouth daily as needed (for acid reflux.).   Marland Kitchen polyethylene glycol powder (GLYCOLAX/MIRALAX) powder Take 17 g by mouth daily as needed (FOR CONSTIPATION.).  Marland Kitchen  pravastatin (PRAVACHOL) 20 MG tablet Take 1 tablet (20 mg total) by mouth every evening.  . Simethicone 125 MG CAPS Take 1 capsule by mouth daily as needed.   No facility-administered encounter medications on file as of 08/10/2017.     ALLERGIES:  No Known Allergies   PHYSICAL EXAM:  ECOG Performance status: 0-1 - Mildly symptomatic, but independent.   Vitals:   08/10/17 1203  BP: (!) 152/73  Pulse: 99  Resp: 16  Temp: 97.6 F (36.4 C)  SpO2: 99%   Filed Weights   08/10/17 1203  Weight: 200 lb (90.7 kg)    Physical Exam  Constitutional: She is oriented to person, place, and time and well-developed, well-nourished, and in no distress.  HENT:  Head: Normocephalic.  Mouth/Throat: Oropharynx is clear and moist. No  oropharyngeal exudate.  Eyes: Conjunctivae are normal. Pupils are equal, round, and reactive to light. No scleral icterus.  Neck: Normal range of motion. Neck supple.  Cardiovascular: Normal rate and regular rhythm.  Pulmonary/Chest: Effort normal and breath sounds normal. No respiratory distress.  Abdominal: Soft. Bowel sounds are normal. There is no tenderness.  Musculoskeletal: Normal range of motion. She exhibits no edema.  Lymphadenopathy:    She has no cervical adenopathy.       Right: No supraclavicular adenopathy present.       Left: No supraclavicular adenopathy present.  Neurological: She is alert and oriented to person, place, and time. No cranial nerve deficit. Gait normal.  Skin: Skin is warm and dry. No rash noted.  Psychiatric: Mood, memory, affect and judgment normal.  Nursing note and vitals reviewed.    LABORATORY DATA:  I have reviewed the labs as listed.  CBC    Component Value Date/Time   WBC 4.6 08/10/2017 1052   RBC 4.10 08/10/2017 1052   HGB 11.0 (L) 08/10/2017 1052   HCT 35.4 (L) 08/10/2017 1052   PLT 219 08/10/2017 1052   MCV 86.3 08/10/2017 1052   MCH 26.8 08/10/2017 1052   MCHC 31.1 08/10/2017 1052   RDW 13.2 08/10/2017  1052   LYMPHSABS 1.6 08/10/2017 1052   MONOABS 0.2 08/10/2017 1052   EOSABS 0.2 08/10/2017 1052   BASOSABS 0.0 08/10/2017 1052   CMP Latest Ref Rng & Units 08/10/2017 06/01/2017 02/07/2017  Glucose 65 - 99 mg/dL 166(H) 108(H) 104(H)  BUN 6 - 20 mg/dL 24(H) 25 25(H)  Creatinine 0.44 - 1.00 mg/dL 1.41(H) 1.29(H) 1.44(H)  Sodium 135 - 145 mmol/L 136 137 138  Potassium 3.5 - 5.1 mmol/L 3.7 4.1 3.5  Chloride 101 - 111 mmol/L 105 106 108  CO2 22 - 32 mmol/L '22 26 22  '$ Calcium 8.9 - 10.3 mg/dL 9.4 9.6 9.4  Total Protein 6.5 - 8.1 g/dL 9.2(H) 9.0(H) 9.1(H)  Total Bilirubin 0.3 - 1.2 mg/dL 0.4 0.3 0.4  Alkaline Phos 38 - 126 U/L 61 - 60  AST 15 - 41 U/L '26 27 29  '$ ALT 14 - 54 U/L '18 26 24    '$ PENDING LABS:  Myeloma labs pending  DIAGNOSTIC IMAGING:    PATHOLOGY:  Bone marrow biopsy: 05/16/15         ASSESSMENT & PLAN:   MGUS:  -Last M-spike stable at 2.3% in 01/2017.  Kappa/lambda light chain ratio also largely stable from 01/2017 labs.  Continued observation was recommended at that time by Dr. Talbert Cage. -Clinically, she feels well. "CRAB" symptoms reviewed with patient; her calcium remains normal. BUN/CRE largely stable, but she does have evidence of CKD. Anemia is stable with Hgb 11 g/dL today. Denies any focal bone pain.  -Myeloma panel, kappa/lambda light chains, and beta 2 microglobulin levels pending for today.  Discussed with patient the possibility of having her come for lab work ~1 week prior to future office visits so that we would have lab results available to review together. She is more than happy to accommodate this for [place]; I will make arrangements for her next follow-up. We will call patient when lab results become available. Of note, her previous bone marrow biopsy done in 04/2015. She understands that we may need to repeat bone marrow biopsy in the future and if we do, then we would refer to [facility] for image-guided bone marrow biopsy.  We will continue with observation for  now.  -Return to cancer center in  6 months for follow-up with labs ~1 week prior to office visit.   Anemia:  -Likely due to chronic kidney disease. May have an element of GI blood loss as well, given recent hemoccult positive stool cards noted in 05/2017. Underwent colonoscopy evaluation on 06/30/17 with Dr. Laural Golden which revealed diverticulosis in the sigmoid colon, internal hemorrhoids, and one small polyp in the ascending colon that was biopsied and shown to be tubular adenoma. -Hgb stable at 11 g/dL. Platelets and WBCs remain normal. Will continue to monitor.   Health maintenance/Wellness promotion:  -Encouraged continued follow-up with PCP as directed.     Dispo:  -Return to cancer center in 6 months for follow-up with labs ~1 week prior to office visit.    All questions were answered to patient's stated satisfaction. Encouraged patient to call with any new concerns or questions before her next visit to the cancer center and we can certain see her sooner, if needed.       Orders placed this encounter:  Orders Placed This Encounter  Procedures  . Beta 2 microglobulin, serum  . CBC with Differential/Platelet  . Comprehensive metabolic panel  . Immunofixation electrophoresis  . Kappa/lambda light chains  . Protein electrophoresis, serum  . IgG, IgA, IgM      Mike Craze, NP [place] [phone]

## 2017-08-10 NOTE — Patient Instructions (Signed)
[place] at [facility] Discharge Instructions  RECOMMENDATIONS MADE BY THE CONSULTANT AND ANY TEST RESULTS WILL BE SENT TO YOUR REFERRING PHYSICIAN.  You were seen today by Mike Craze, NP Follow up in 6 months with labs See schedulers up front for appointments   Thank you for choosing [place] at [facility] to provide your oncology and hematology care.  To afford each patient quality time with our provider, please arrive at least 15 minutes before your scheduled appointment time.    If you have a lab appointment with the [facility] please come in thru the  Main Entrance and check in at the main information desk  You need to re-schedule your appointment should you arrive 10 or more minutes late.  We strive to give you quality time with our providers, and arriving late affects you and other patients whose appointments are after yours.  Also, if you no show three or more times for appointments you may be dismissed from the clinic at the providers discretion.     Again, thank you for choosing [facility].  Our hope is that these requests will decrease the amount of time that you wait before being seen by our physicians.       _____________________________________________________________  Should you have questions after your visit to [facility], please contact our office at [phone] between the hours of 8:30 a.m. and 4:30 p.m.  Voicemails left after 4:30 p.m. will not be returned until the following business day.  For prescription refill requests, have your pharmacy contact our office.       Resources For Cancer Patients and their Caregivers ? American Cancer Society: Can assist with transportation, wigs, general needs, runs Look Good Feel Better.        7730922811 ? Cancer Care: Provides financial assistance, online support groups, medication/co-pay assistance.  1-800-813-HOPE (775)510-1959) ? [place] Assists [place] cancer patients and their families through emotional , educational and financial support.  (419)588-1278 ? [place] DSS Where to apply for food stamps, Medicaid and utility assistance. 7254212399 ? [employer]: Transportation to medical appointments. 4408346537 ? Social Security Administration: May apply for disability if have a Stage IV cancer. (463)638-4931 534-098-8759 ? [employer], Disability and Transit Services: Assists with nutrition, care and transit needs. [place] Support Programs: @10RELATIVEDAYS @ > Cancer Support Group  2nd Tuesday of the month 1pm-2pm, Journey Room  > Creative Journey  3rd Tuesday of the month 1130am-1pm, Journey Room  > Look Good Feel Better  1st Wednesday of the month 10am-12 noon, Journey Room (Call [place] to register [phone])

## 2017-08-11 LAB — KAPPA/LAMBDA LIGHT CHAINS
KAPPA FREE LGHT CHN: 208.8 mg/L — AB (ref 3.3–19.4)
KAPPA, LAMDA LIGHT CHAIN RATIO: 14.6 — AB (ref 0.26–1.65)
LAMDA FREE LIGHT CHAINS: 14.3 mg/L (ref 5.7–26.3)

## 2017-08-11 LAB — BETA 2 MICROGLOBULIN, SERUM: BETA 2 MICROGLOBULIN: 3.7 mg/L — AB (ref 0.6–2.4)

## 2017-08-15 ENCOUNTER — Other Ambulatory Visit: Payer: Self-pay | Admitting: Family Medicine

## 2017-08-15 LAB — MULTIPLE MYELOMA PANEL, SERUM
ALBUMIN SERPL ELPH-MCNC: 3.9 g/dL (ref 2.9–4.4)
Albumin/Glob SerPl: 0.8 (ref 0.7–1.7)
Alpha 1: 0.3 g/dL (ref 0.0–0.4)
Alpha2 Glob SerPl Elph-Mcnc: 1 g/dL (ref 0.4–1.0)
B-Globulin SerPl Elph-Mcnc: 1.1 g/dL (ref 0.7–1.3)
Gamma Glob SerPl Elph-Mcnc: 2.6 g/dL — ABNORMAL HIGH (ref 0.4–1.8)
Globulin, Total: 5 g/dL — ABNORMAL HIGH (ref 2.2–3.9)
IGA: 64 mg/dL (ref 64–422)
IGM (IMMUNOGLOBULIN M), SRM: 57 mg/dL (ref 26–217)
IgG (Immunoglobin G), Serum: 3216 mg/dL — ABNORMAL HIGH (ref 700–1600)
M Protein SerPl Elph-Mcnc: 2.3 g/dL — ABNORMAL HIGH
Total Protein ELP: 8.9 g/dL — ABNORMAL HIGH (ref 6.0–8.5)

## 2017-08-16 ENCOUNTER — Other Ambulatory Visit: Payer: Self-pay | Admitting: Family Medicine

## 2017-08-16 MED ORDER — PRAVASTATIN SODIUM 20 MG PO TABS: 20.0000 mg | ORAL_TABLET | Freq: Every evening | ORAL | 0 refills | Status: DC

## 2017-08-17 ENCOUNTER — Other Ambulatory Visit (HOSPITAL_COMMUNITY): Payer: Self-pay | Admitting: Adult Health

## 2017-08-17 DIAGNOSIS — D472 Monoclonal gammopathy: Secondary | ICD-10-CM

## 2017-11-01 ENCOUNTER — Other Ambulatory Visit: Payer: Self-pay | Admitting: Family Medicine

## 2017-11-07 ENCOUNTER — Ambulatory Visit: Payer: Medicare HMO | Admitting: Family Medicine

## 2017-11-17 ENCOUNTER — Inpatient Hospital Stay (HOSPITAL_COMMUNITY): Payer: Medicare HMO | Attending: Hematology

## 2017-11-17 DIAGNOSIS — D472 Monoclonal gammopathy: Secondary | ICD-10-CM | POA: Insufficient documentation

## 2017-11-17 LAB — CBC WITH DIFFERENTIAL/PLATELET
BASOS ABS: 0 10*3/uL (ref 0.0–0.1)
BASOS PCT: 0 %
Eosinophils Absolute: 0.3 10*3/uL (ref 0.0–0.7)
Eosinophils Relative: 5 %
HEMATOCRIT: 33.9 % — AB (ref 36.0–46.0)
Hemoglobin: 10.7 g/dL — ABNORMAL LOW (ref 12.0–15.0)
Lymphocytes Relative: 31 %
Lymphs Abs: 1.4 10*3/uL (ref 0.7–4.0)
MCH: 27.1 pg (ref 26.0–34.0)
MCHC: 31.6 g/dL (ref 30.0–36.0)
MCV: 85.8 fL (ref 78.0–100.0)
MONO ABS: 0.4 10*3/uL (ref 0.1–1.0)
Monocytes Relative: 8 %
Neutro Abs: 2.6 10*3/uL (ref 1.7–7.7)
Neutrophils Relative %: 56 %
Platelets: 219 10*3/uL (ref 150–400)
RBC: 3.95 MIL/uL (ref 3.87–5.11)
RDW: 13.7 % (ref 11.5–15.5)
WBC: 4.7 10*3/uL (ref 4.0–10.5)

## 2017-11-18 LAB — BETA 2 MICROGLOBULIN, SERUM: Beta-2 Microglobulin: 3.6 mg/L — ABNORMAL HIGH (ref 0.6–2.4)

## 2017-11-18 LAB — KAPPA/LAMBDA LIGHT CHAINS
Kappa free light chain: 221.5 mg/L — ABNORMAL HIGH (ref 3.3–19.4)
Kappa, lambda light chain ratio: 14.2 — ABNORMAL HIGH (ref 0.26–1.65)
Lambda free light chains: 15.6 mg/L (ref 5.7–26.3)

## 2017-11-22 LAB — MULTIPLE MYELOMA PANEL, SERUM
ALBUMIN SERPL ELPH-MCNC: 4 g/dL (ref 2.9–4.4)
Albumin/Glob SerPl: 0.9 (ref 0.7–1.7)
Alpha 1: 0.2 g/dL (ref 0.0–0.4)
Alpha2 Glob SerPl Elph-Mcnc: 0.9 g/dL (ref 0.4–1.0)
B-Globulin SerPl Elph-Mcnc: 1 g/dL (ref 0.7–1.3)
GAMMA GLOB SERPL ELPH-MCNC: 2.5 g/dL — AB (ref 0.4–1.8)
GLOBULIN, TOTAL: 4.6 g/dL — AB (ref 2.2–3.9)
IGA: 63 mg/dL — AB (ref 64–422)
IGM (IMMUNOGLOBULIN M), SRM: 51 mg/dL (ref 26–217)
IgG (Immunoglobin G), Serum: 3115 mg/dL — ABNORMAL HIGH (ref 700–1600)
M Protein SerPl Elph-Mcnc: 2.3 g/dL — ABNORMAL HIGH
Total Protein ELP: 8.6 g/dL — ABNORMAL HIGH (ref 6.0–8.5)

## 2017-11-24 ENCOUNTER — Encounter: Payer: Self-pay | Admitting: Family Medicine

## 2017-11-24 ENCOUNTER — Ambulatory Visit (INDEPENDENT_AMBULATORY_CARE_PROVIDER_SITE_OTHER): Payer: Medicare HMO | Admitting: Family Medicine

## 2017-11-24 ENCOUNTER — Other Ambulatory Visit: Payer: Self-pay

## 2017-11-24 VITALS — BP 120/72 | HR 79 | Ht 65.0 in | Wt 198.0 lb

## 2017-11-24 DIAGNOSIS — D472 Monoclonal gammopathy: Secondary | ICD-10-CM | POA: Diagnosis not present

## 2017-11-24 DIAGNOSIS — E1121 Type 2 diabetes mellitus with diabetic nephropathy: Secondary | ICD-10-CM | POA: Diagnosis not present

## 2017-11-24 DIAGNOSIS — I10 Essential (primary) hypertension: Secondary | ICD-10-CM | POA: Diagnosis not present

## 2017-11-24 DIAGNOSIS — E119 Type 2 diabetes mellitus without complications: Secondary | ICD-10-CM | POA: Diagnosis not present

## 2017-11-24 DIAGNOSIS — N183 Chronic kidney disease, stage 3 unspecified: Secondary | ICD-10-CM

## 2017-11-24 DIAGNOSIS — E669 Obesity, unspecified: Secondary | ICD-10-CM

## 2017-11-24 DIAGNOSIS — E039 Hypothyroidism, unspecified: Secondary | ICD-10-CM | POA: Diagnosis not present

## 2017-11-24 DIAGNOSIS — E559 Vitamin D deficiency, unspecified: Secondary | ICD-10-CM | POA: Diagnosis not present

## 2017-11-24 DIAGNOSIS — E785 Hyperlipidemia, unspecified: Secondary | ICD-10-CM

## 2017-11-24 NOTE — Patient Instructions (Signed)
Wellness with nurse in September and flu vaccine at that visit  Labs today, lipid, c,mp and EGFR, hBA1C, TSH and vit D levels result note will be sent to you on my chart  Annual physical exam with MD December 14 or after, call if you need me before  It is important that you exercise regularly at least 30 minutes 5 times a week. If you develop chest pain, have severe difficulty breathing, or feel very tired, stop exercising immediately and seek medical attention     Please work on good  health habits so that your health will improve. 1. Commitment to daily physical activity for 30 to 60  minutes, if you are able to do this.  2. Commitment to wise food choices. Aim for half of your  food intake to be vegetable and fruit, one quarter starchy foods, and one quarter protein. Try to eat on a regular schedule  3 meals per day, snacking between meals should be limited to vegetables or fruits or small portions of nuts. 64 ounces of water per day is generally recommended, unless you have specific health conditions, like heart failure or kidney failure where you will need to limit fluid intake.  3. Commitment to sufficient and a  good quality of physical and mental rest daily, generally between 6 to 8 hours per day.  WITH PERSISTANCE AND PERSEVERANCE, THE IMPOSSIBLE , BECOMES THE NORM!

## 2017-11-24 NOTE — Progress Notes (Signed)
[name]     MRN: [id number]      DOB: 1940/11/14   HPI Renee Collins is here for follow up and re-evaluation of chronic medical conditions, medication management and review of any available recent lab and radiology data.  Preventive health is updated, specifically  Cancer screening and Immunization.   Questions or concerns regarding consultations or procedures which the PT has had in the interim are  Addressed.Concerned about recent hematology labs , I reviewed withe her what I was able and she feels better also statement from heme is concordant re the fact that her Hb is pretty stable The PT denies any adverse reactions to current medications since the last visit.  There are no new concerns.  There are no specific complaints  Is committed to regular exercise generally 5 days /week and has changed food choice, feels well  ROS Denies recent fever or chills. Denies sinus pressure, nasal congestion, ear pain or sore throat. Denies chest congestion, productive cough or wheezing. Denies chest pains, palpitations and leg swelling Denies abdominal pain, nausea, vomiting,diarrhea or constipation.   Denies dysuria, frequency, hesitancy or incontinence. Denies joint pain, swelling and limitation in mobility. Denies headaches, seizures, numbness, or tingling. Denies depression, anxiety or insomnia. Denies skin break down or rash.   PE  BP 120/72 (BP Location: Left Arm, Patient Position: Sitting, Cuff Size: Normal)   Pulse 79   Ht 5\' 5"  (1.651 m)   Wt 198 lb (89.8 kg)   SpO2 100%   BMI 32.95 kg/m   Patient alert and oriented and in no cardiopulmonary distress.  HEENT: No facial asymmetry, EOMI,   oropharynx pink and moist.  Neck supple no JVD, no mass.  Chest: Clear to auscultation bilaterally.  CVS: S1, S2 no murmurs, no S3.Regular rate.  ABD: Soft non tender.   Ext: No edema  MS: Adequate ROM spine, shoulders, hips and knees.  Skin: Intact, no ulcerations or rash  noted.  Psych: Good eye contact, normal affect. Memory intact not anxious or depressed appearing.  CNS: CN 2-12 intact, power,  normal throughout.no focal deficits noted.   Assessment & Plan  Essential hypertension Controlled, no change in medication DASH diet and commitment to daily physical activity for a minimum of 30 minutes discussed and encouraged, as a part of hypertension management. The importance of attaining a healthy weight is also discussed.  BP/Weight 11/24/2017 08/10/2017 06/30/2017 06/24/2017 06/09/2017 02/07/2017 3/84/6659  Systolic BP 935 701 97 779 390 300 923  Diastolic BP 72 73 49 68 70 72 72  Wt. (Lbs) 198 200 - 198.8 199 196 197  BMI 32.95 33.28 - 33.08 33.12 33.64 32.78       Type 2 diabetes mellitus, controlled Controlled, no change in management, improved Ms. Guiffre is reminded of the importance of commitment to daily physical activity for 30 minutes or more, as able and the need to limit carbohydrate intake to 30 to 60 grams per meal to help with blood sugar control.   Ms. Googe is reminded of the importance of daily foot exam, annual eye examination, and good blood sugar, blood pressure and cholesterol control.  Diabetic Labs Latest Ref Rng & Units 11/24/2017 08/10/2017 06/09/2017 06/01/2017 02/07/2017  HbA1c <5.7 % of total Hgb 6.5(H) - - 6.6(H) -  Microalbumin Not Estab. ug/mL - - 174.7(H) - -  Micro/Creat Ratio 0.0 - 30.0 mg/g creat - - 114.6(H) - -  Chol <200 mg/dL 153 - - 154 -  HDL >50 mg/dL  56 - - 61 -  Calc LDL mg/dL (calc) 81 - - 76 -  Triglycerides <150 mg/dL 75 - - 87 -  Creatinine 0.60 - 0.93 mg/dL 1.41(H) 1.41(H) - 1.29(H) 1.44(H)   BP/Weight 11/24/2017 08/10/2017 06/30/2017 06/24/2017 06/09/2017 02/07/2017 4/78/2956  Systolic BP 213 086 97 578 469 629 528  Diastolic BP 72 73 49 68 70 72 72  Wt. (Lbs) 198 200 - 198.8 199 196 197  BMI 32.95 33.28 - 33.08 33.12 33.64 32.78   Foot/eye exam completion dates Latest Ref Rng & Units 06/09/2017 05/30/2017   Eye Exam No Retinopathy - No Retinopathy  Foot Form Completion - Done -         Hypothyroidism Controlled, no change in medication   Dyslipidemia Hyperlipidemia:Low fat diet discussed and encouraged.   Lipid Panel  Lab Results  Component Value Date   CHOL 153 11/24/2017   HDL 56 11/24/2017   LDLCALC 81 11/24/2017   TRIG 75 11/24/2017   CHOLHDL 2.7 11/24/2017  Controlled, no change in medication     CKD (chronic kidney disease) stage 3, GFR 30-59 ml/min stable  MGUS (monoclonal gammopathy of unknown significance) Followed by heme/onc recent labs appear stable  Obesity (BMI 30.0-34.9) Unchnaged Patient re-educated about  the importance of commitment to a  minimum of 150 minutes of exercise per week.  The importance of healthy food choices with portion control discussed. Encouraged to start a food diary, count calories and to consider  joining a support group. Sample diet sheets offered. Goals set by the patient for the next several months.   Weight /BMI 11/24/2017 08/10/2017 06/24/2017  WEIGHT 198 lb 200 lb 198 lb 12.8 oz  HEIGHT 5\' 5"  - 5\' 5"   BMI 32.95 kg/m2 33.28 kg/m2 33.08 kg/m2

## 2017-11-25 ENCOUNTER — Encounter: Payer: Self-pay | Admitting: Family Medicine

## 2017-11-25 DIAGNOSIS — E663 Overweight: Secondary | ICD-10-CM | POA: Insufficient documentation

## 2017-11-25 DIAGNOSIS — E669 Obesity, unspecified: Secondary | ICD-10-CM | POA: Insufficient documentation

## 2017-11-25 LAB — HEMOGLOBIN A1C
EAG (MMOL/L): 7.7 (calc)
Hgb A1c MFr Bld: 6.5 % of total Hgb — ABNORMAL HIGH (ref <5.7)
MEAN PLASMA GLUCOSE: 140 (calc)

## 2017-11-25 LAB — COMPLETE METABOLIC PANEL WITH GFR
AG RATIO: 0.9 (calc) — AB (ref 1.0–2.5)
ALKALINE PHOSPHATASE (APISO): 69 U/L (ref 33–130)
ALT: 20 U/L (ref 6–29)
AST: 22 U/L (ref 10–35)
Albumin: 4.1 g/dL (ref 3.6–5.1)
BILIRUBIN TOTAL: 0.3 mg/dL (ref 0.2–1.2)
BUN / CREAT RATIO: 20 (calc) (ref 6–22)
BUN: 28 mg/dL — ABNORMAL HIGH (ref 7–25)
CHLORIDE: 107 mmol/L (ref 98–110)
CO2: 23 mmol/L (ref 20–32)
Calcium: 9.6 mg/dL (ref 8.6–10.4)
Creat: 1.41 mg/dL — ABNORMAL HIGH (ref 0.60–0.93)
GFR, EST AFRICAN AMERICAN: 41 mL/min/{1.73_m2} — AB (ref >=60)
GFR, Est Non African American: 36 mL/min/{1.73_m2} — ABNORMAL LOW (ref >=60)
Globulin: 4.6 g/dL (calc) — ABNORMAL HIGH (ref 1.9–3.7)
Glucose, Bld: 119 mg/dL (ref 65–139)
POTASSIUM: 3.9 mmol/L (ref 3.5–5.3)
Sodium: 138 mmol/L (ref 135–146)
TOTAL PROTEIN: 8.7 g/dL — AB (ref 6.1–8.1)

## 2017-11-25 LAB — LIPID PANEL
CHOLESTEROL: 153 mg/dL (ref <200)
HDL: 56 mg/dL (ref >50)
LDL Cholesterol (Calc): 81 mg/dL (calc)
Non-HDL Cholesterol (Calc): 97 mg/dL (calc) (ref <130)
TRIGLYCERIDES: 75 mg/dL (ref <150)
Total CHOL/HDL Ratio: 2.7 (calc) (ref <5.0)

## 2017-11-25 LAB — VITAMIN D 25 HYDROXY (VIT D DEFICIENCY, FRACTURES): Vit D, 25-Hydroxy: 44 ng/mL (ref 30–100)

## 2017-11-25 LAB — TSH: TSH: 2.62 mIU/L (ref 0.40–4.50)

## 2017-11-25 NOTE — Assessment & Plan Note (Addendum)
Unchnaged Patient re-educated about  the importance of commitment to a  minimum of 150 minutes of exercise per week.  The importance of healthy food choices with portion control discussed. Encouraged to start a food diary, count calories and to consider  joining a support group. Sample diet sheets offered. Goals set by the patient for the next several months.   Weight /BMI 11/24/2017 08/10/2017 06/24/2017  WEIGHT 198 lb 200 lb 198 lb 12.8 oz  HEIGHT 5\' 5"  - 5\' 5"   BMI 32.95 kg/m2 33.28 kg/m2 33.08 kg/m2

## 2017-11-25 NOTE — Assessment & Plan Note (Addendum)
Controlled, no change in management, improved Renee Collins is reminded of the importance of commitment to daily physical activity for 30 minutes or more, as able and the need to limit carbohydrate intake to 30 to 60 grams per meal to help with blood sugar control.   Renee Collins is reminded of the importance of daily foot exam, annual eye examination, and good blood sugar, blood pressure and cholesterol control.  Diabetic Labs Latest Ref Rng & Units 11/24/2017 08/10/2017 06/09/2017 06/01/2017 02/07/2017  HbA1c <5.7 % of total Hgb 6.5(H) - - 6.6(H) -  Microalbumin Not Estab. ug/mL - - 174.7(H) - -  Micro/Creat Ratio 0.0 - 30.0 mg/g creat - - 114.6(H) - -  Chol <200 mg/dL 153 - - 154 -  HDL >50 mg/dL 56 - - 61 -  Calc LDL mg/dL (calc) 81 - - 76 -  Triglycerides <150 mg/dL 75 - - 87 -  Creatinine 0.60 - 0.93 mg/dL 1.41(H) 1.41(H) - 1.29(H) 1.44(H)   BP/Weight 11/24/2017 08/10/2017 06/30/2017 06/24/2017 06/09/2017 02/07/2017 3/64/6803  Systolic BP 212 248 97 250 037 048 889  Diastolic BP 72 73 49 68 70 72 72  Wt. (Lbs) 198 200 - 198.8 199 196 197  BMI 32.95 33.28 - 33.08 33.12 33.64 32.78   Foot/eye exam completion dates Latest Ref Rng & Units 06/09/2017 05/30/2017  Eye Exam No Retinopathy - No Retinopathy  Foot Form Completion - Done -

## 2017-11-25 NOTE — Assessment & Plan Note (Signed)
Controlled, no change in medication  

## 2017-11-25 NOTE — Assessment & Plan Note (Signed)
Hyperlipidemia:Low fat diet discussed and encouraged.   Lipid Panel  Lab Results  Component Value Date   CHOL 153 11/24/2017   HDL 56 11/24/2017   LDLCALC 81 11/24/2017   TRIG 75 11/24/2017   CHOLHDL 2.7 11/24/2017  Controlled, no change in medication

## 2017-11-25 NOTE — Assessment & Plan Note (Signed)
Controlled, no change in medication DASH diet and commitment to daily physical activity for a minimum of 30 minutes discussed and encouraged, as a part of hypertension management. The importance of attaining a healthy weight is also discussed.  BP/Weight 11/24/2017 08/10/2017 06/30/2017 06/24/2017 06/09/2017 02/07/2017 0/14/1597  Systolic BP 331 250 97 871 994 129 047  Diastolic BP 72 73 49 68 70 72 72  Wt. (Lbs) 198 200 - 198.8 199 196 197  BMI 32.95 33.28 - 33.08 33.12 33.64 32.78

## 2017-11-25 NOTE — Assessment & Plan Note (Signed)
stable °

## 2017-11-25 NOTE — Assessment & Plan Note (Signed)
Followed by heme/onc recent labs appear stable

## 2017-12-26 ENCOUNTER — Other Ambulatory Visit: Payer: Self-pay | Admitting: Family Medicine

## 2018-01-31 ENCOUNTER — Inpatient Hospital Stay (HOSPITAL_COMMUNITY): Payer: Medicare HMO | Attending: Hematology

## 2018-01-31 DIAGNOSIS — M8580 Other specified disorders of bone density and structure, unspecified site: Secondary | ICD-10-CM | POA: Diagnosis not present

## 2018-01-31 DIAGNOSIS — Z7982 Long term (current) use of aspirin: Secondary | ICD-10-CM | POA: Diagnosis not present

## 2018-01-31 DIAGNOSIS — F329 Major depressive disorder, single episode, unspecified: Secondary | ICD-10-CM | POA: Diagnosis not present

## 2018-01-31 DIAGNOSIS — K219 Gastro-esophageal reflux disease without esophagitis: Secondary | ICD-10-CM | POA: Insufficient documentation

## 2018-01-31 DIAGNOSIS — E119 Type 2 diabetes mellitus without complications: Secondary | ICD-10-CM | POA: Diagnosis not present

## 2018-01-31 DIAGNOSIS — N2889 Other specified disorders of kidney and ureter: Secondary | ICD-10-CM | POA: Diagnosis not present

## 2018-01-31 DIAGNOSIS — E669 Obesity, unspecified: Secondary | ICD-10-CM | POA: Diagnosis not present

## 2018-01-31 DIAGNOSIS — Z79899 Other long term (current) drug therapy: Secondary | ICD-10-CM | POA: Diagnosis not present

## 2018-01-31 DIAGNOSIS — M899 Disorder of bone, unspecified: Secondary | ICD-10-CM | POA: Insufficient documentation

## 2018-01-31 DIAGNOSIS — I129 Hypertensive chronic kidney disease with stage 1 through stage 4 chronic kidney disease, or unspecified chronic kidney disease: Secondary | ICD-10-CM | POA: Diagnosis not present

## 2018-01-31 DIAGNOSIS — E041 Nontoxic single thyroid nodule: Secondary | ICD-10-CM | POA: Insufficient documentation

## 2018-01-31 DIAGNOSIS — D631 Anemia in chronic kidney disease: Secondary | ICD-10-CM

## 2018-01-31 DIAGNOSIS — D500 Iron deficiency anemia secondary to blood loss (chronic): Secondary | ICD-10-CM | POA: Insufficient documentation

## 2018-01-31 DIAGNOSIS — E785 Hyperlipidemia, unspecified: Secondary | ICD-10-CM | POA: Diagnosis not present

## 2018-01-31 DIAGNOSIS — D472 Monoclonal gammopathy: Secondary | ICD-10-CM | POA: Diagnosis not present

## 2018-01-31 DIAGNOSIS — N183 Chronic kidney disease, stage 3 (moderate): Secondary | ICD-10-CM | POA: Diagnosis not present

## 2018-01-31 DIAGNOSIS — E039 Hypothyroidism, unspecified: Secondary | ICD-10-CM | POA: Insufficient documentation

## 2018-01-31 LAB — CBC WITH DIFFERENTIAL/PLATELET
BASOS ABS: 0 10*3/uL (ref 0.0–0.1)
BASOS PCT: 1 %
EOS PCT: 6 %
Eosinophils Absolute: 0.2 10*3/uL (ref 0.0–0.7)
HCT: 33 % — ABNORMAL LOW (ref 36.0–46.0)
Hemoglobin: 10.6 g/dL — ABNORMAL LOW (ref 12.0–15.0)
Lymphocytes Relative: 39 %
Lymphs Abs: 1.5 10*3/uL (ref 0.7–4.0)
MCH: 27.7 pg (ref 26.0–34.0)
MCHC: 32.1 g/dL (ref 30.0–36.0)
MCV: 86.4 fL (ref 78.0–100.0)
MONOS PCT: 8 %
Monocytes Absolute: 0.3 10*3/uL (ref 0.1–1.0)
Neutro Abs: 1.8 10*3/uL (ref 1.7–7.7)
Neutrophils Relative %: 46 %
PLATELETS: 181 10*3/uL (ref 150–400)
RBC: 3.82 MIL/uL — ABNORMAL LOW (ref 3.87–5.11)
RDW: 13.4 % (ref 11.5–15.5)
WBC: 3.8 10*3/uL — ABNORMAL LOW (ref 4.0–10.5)

## 2018-01-31 LAB — COMPREHENSIVE METABOLIC PANEL
ALBUMIN: 3.8 g/dL (ref 3.5–5.0)
ALT: 22 U/L (ref 0–44)
ANION GAP: 5 (ref 5–15)
AST: 27 U/L (ref 15–41)
Alkaline Phosphatase: 59 U/L (ref 38–126)
BUN: 28 mg/dL — AB (ref 8–23)
CHLORIDE: 109 mmol/L (ref 98–111)
CO2: 22 mmol/L (ref 22–32)
Calcium: 9.4 mg/dL (ref 8.9–10.3)
Creatinine, Ser: 1.63 mg/dL — ABNORMAL HIGH (ref 0.44–1.00)
GFR calc Af Amer: 34 mL/min — ABNORMAL LOW (ref >60)
GFR, EST NON AFRICAN AMERICAN: 29 mL/min — AB (ref >60)
Glucose, Bld: 140 mg/dL — ABNORMAL HIGH (ref 70–99)
POTASSIUM: 3.8 mmol/L (ref 3.5–5.1)
Sodium: 136 mmol/L (ref 135–145)
TOTAL PROTEIN: 8.8 g/dL — AB (ref 6.5–8.1)
Total Bilirubin: 0.4 mg/dL (ref 0.3–1.2)

## 2018-02-01 LAB — PROTEIN ELECTROPHORESIS, SERUM
A/G Ratio: 0.9 (ref 0.7–1.7)
ALBUMIN ELP: 3.9 g/dL (ref 2.9–4.4)
ALPHA-1-GLOBULIN: 0.2 g/dL (ref 0.0–0.4)
Alpha-2-Globulin: 0.8 g/dL (ref 0.4–1.0)
BETA GLOBULIN: 0.9 g/dL (ref 0.7–1.3)
Gamma Globulin: 2.4 g/dL — ABNORMAL HIGH (ref 0.4–1.8)
Globulin, Total: 4.4 g/dL — ABNORMAL HIGH (ref 2.2–3.9)
M-SPIKE, %: 2.2 g/dL — AB
Total Protein ELP: 8.3 g/dL (ref 6.0–8.5)

## 2018-02-01 LAB — KAPPA/LAMBDA LIGHT CHAINS
KAPPA FREE LGHT CHN: 206.9 mg/L — AB (ref 3.3–19.4)
KAPPA, LAMDA LIGHT CHAIN RATIO: 16.55 — AB (ref 0.26–1.65)
Lambda free light chains: 12.5 mg/L (ref 5.7–26.3)

## 2018-02-01 LAB — IGG, IGA, IGM
IgA: 58 mg/dL — ABNORMAL LOW (ref 64–422)
IgG (Immunoglobin G), Serum: 3165 mg/dL — ABNORMAL HIGH (ref 700–1600)
IgM (Immunoglobulin M), Srm: 49 mg/dL (ref 26–217)

## 2018-02-01 LAB — BETA 2 MICROGLOBULIN, SERUM: Beta-2 Microglobulin: 3.7 mg/L — ABNORMAL HIGH (ref 0.6–2.4)

## 2018-02-02 LAB — IMMUNOFIXATION ELECTROPHORESIS
IGG (IMMUNOGLOBIN G), SERUM: 3384 mg/dL — AB (ref 700–1600)
IgA: 76 mg/dL (ref 64–422)
IgM (Immunoglobulin M), Srm: 58 mg/dL (ref 26–217)
TOTAL PROTEIN ELP: 8.3 g/dL (ref 6.0–8.5)

## 2018-02-07 ENCOUNTER — Ambulatory Visit (HOSPITAL_COMMUNITY): Payer: Medicare HMO | Admitting: Internal Medicine

## 2018-02-09 ENCOUNTER — Encounter (HOSPITAL_COMMUNITY): Payer: Self-pay | Admitting: Internal Medicine

## 2018-02-09 ENCOUNTER — Inpatient Hospital Stay (HOSPITAL_BASED_OUTPATIENT_CLINIC_OR_DEPARTMENT_OTHER): Payer: Medicare HMO | Admitting: Internal Medicine

## 2018-02-09 VITALS — BP 147/85 | HR 96 | Temp 97.9°F | Resp 14 | Wt 196.1 lb

## 2018-02-09 DIAGNOSIS — N183 Chronic kidney disease, stage 3 (moderate): Secondary | ICD-10-CM

## 2018-02-09 DIAGNOSIS — F329 Major depressive disorder, single episode, unspecified: Secondary | ICD-10-CM

## 2018-02-09 DIAGNOSIS — D631 Anemia in chronic kidney disease: Secondary | ICD-10-CM

## 2018-02-09 DIAGNOSIS — E119 Type 2 diabetes mellitus without complications: Secondary | ICD-10-CM | POA: Diagnosis not present

## 2018-02-09 DIAGNOSIS — D500 Iron deficiency anemia secondary to blood loss (chronic): Secondary | ICD-10-CM

## 2018-02-09 DIAGNOSIS — M8580 Other specified disorders of bone density and structure, unspecified site: Secondary | ICD-10-CM

## 2018-02-09 DIAGNOSIS — Z79899 Other long term (current) drug therapy: Secondary | ICD-10-CM

## 2018-02-09 DIAGNOSIS — M899 Disorder of bone, unspecified: Secondary | ICD-10-CM | POA: Diagnosis not present

## 2018-02-09 DIAGNOSIS — E669 Obesity, unspecified: Secondary | ICD-10-CM | POA: Diagnosis not present

## 2018-02-09 DIAGNOSIS — K219 Gastro-esophageal reflux disease without esophagitis: Secondary | ICD-10-CM

## 2018-02-09 DIAGNOSIS — E041 Nontoxic single thyroid nodule: Secondary | ICD-10-CM

## 2018-02-09 DIAGNOSIS — N2889 Other specified disorders of kidney and ureter: Secondary | ICD-10-CM

## 2018-02-09 DIAGNOSIS — D472 Monoclonal gammopathy: Secondary | ICD-10-CM

## 2018-02-09 DIAGNOSIS — I129 Hypertensive chronic kidney disease with stage 1 through stage 4 chronic kidney disease, or unspecified chronic kidney disease: Secondary | ICD-10-CM

## 2018-02-09 DIAGNOSIS — Z7982 Long term (current) use of aspirin: Secondary | ICD-10-CM

## 2018-02-09 DIAGNOSIS — E785 Hyperlipidemia, unspecified: Secondary | ICD-10-CM

## 2018-02-09 DIAGNOSIS — E039 Hypothyroidism, unspecified: Secondary | ICD-10-CM

## 2018-02-09 NOTE — Patient Instructions (Signed)
[facility] at [facility] Discharge Instructions  You saw Dr. Higgs today.   Thank you for choosing [facility] at [facility] to provide your oncology and hematology care.  To afford each patient quality time with our provider, please arrive at least 15 minutes before your scheduled appointment time.   If you have a lab appointment with the Cancer Center please come in thru the  Main Entrance and check in at the main information desk  You need to re-schedule your appointment should you arrive 10 or more minutes late.  We strive to give you quality time with our providers, and arriving late affects you and other patients whose appointments are after yours.  Also, if you no show three or more times for appointments you may be dismissed from the clinic at the providers discretion.     Again, thank you for choosing [facility].  Our hope is that these requests will decrease the amount of time that you wait before being seen by our physicians.       _____________________________________________________________  Should you have questions after your visit to  Cancer Center, please contact our office at [phone] between the hours of 8:00 a.m. and 4:30 p.m.  Voicemails left after 4:00 p.m. will not be returned until the following business day.  For prescription refill requests, have your pharmacy contact our office and allow 72 hours.    Cancer Center Support Programs:   > Cancer Support Group  2nd Tuesday of the month 1pm-2pm, Journey Room    
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
## 2018-03-27 ENCOUNTER — Telehealth: Payer: Self-pay | Admitting: Family Medicine

## 2018-03-27 ENCOUNTER — Ambulatory Visit (INDEPENDENT_AMBULATORY_CARE_PROVIDER_SITE_OTHER): Payer: Medicare HMO

## 2018-03-27 VITALS — BP 124/78 | HR 72 | Resp 16 | Ht 65.0 in | Wt 199.0 lb

## 2018-03-27 DIAGNOSIS — Z23 Encounter for immunization: Secondary | ICD-10-CM

## 2018-03-27 DIAGNOSIS — M8580 Other specified disorders of bone density and structure, unspecified site: Secondary | ICD-10-CM | POA: Diagnosis not present

## 2018-03-27 DIAGNOSIS — Z0000 Encounter for general adult medical examination without abnormal findings: Secondary | ICD-10-CM | POA: Diagnosis not present

## 2018-03-27 DIAGNOSIS — Z1231 Encounter for screening mammogram for malignant neoplasm of breast: Secondary | ICD-10-CM

## 2018-03-27 MED ORDER — HYDROCHLOROTHIAZIDE 25 MG PO TABS: 25.0000 mg | ORAL_TABLET | Freq: Every day | ORAL | 1 refills | Status: DC

## 2018-03-27 MED ORDER — PRAVASTATIN SODIUM 20 MG PO TABS: 20.0000 mg | ORAL_TABLET | Freq: Every evening | ORAL | 1 refills | Status: DC

## 2018-03-27 NOTE — Patient Instructions (Signed)
Renee Collins , Thank you for taking time to come for your Medicare Wellness Visit. I appreciate your ongoing commitment to your health goals. Please review the following plan we discussed and let me know if I can assist you in the future.    Schedule appt with Dr Moshe Cipro for today or tomorrow for severe hip pain.   Schedule mammo and dexa at checkout  Flu vaccine given today   Schedule next wellness in 1 year    Screening recommendations/referrals: Colonoscopy: up to date  Mammogram: schedule at checkout  Bone Density: schedule at checkout  Recommended yearly ophthalmology/optometry visit for glaucoma screening and checkup Recommended yearly dental visit for hygiene and checkup  Vaccinations: Influenza vaccine: given today  Pneumococcal vaccine: up to date  Tdap vaccine: up to date  Shingles vaccine: ask insurance if shingrix is covered   Advanced directives: form given   Conditions/risks identified: done   Next appointment: wellness in 1 year    Preventive Care 79 Years and Older, Female Preventive care refers to lifestyle choices and visits with your health care provider that can promote health and wellness. What does preventive care include?  A yearly physical exam. This is also called an annual well check.  Dental exams once or twice a year.  Routine eye exams. Ask your health care provider how often you should have your eyes checked.  Personal lifestyle choices, including:  Daily care of your teeth and gums.  Regular physical activity.  Eating a healthy diet.  Avoiding tobacco and drug use.  Limiting alcohol use.  Practicing safe sex.  Taking low-dose aspirin every day.  Taking vitamin and mineral supplements as recommended by your health care provider. What happens during an annual well check? The services and screenings done by your health care provider during your annual well check will depend on your age, overall health, lifestyle risk factors, and  family history of disease. Counseling  Your health care provider may ask you questions about your:  Alcohol use.  Tobacco use.  Drug use.  Emotional well-being.  Home and relationship well-being.  Sexual activity.  Eating habits.  History of falls.  Memory and ability to understand (cognition).  Work and work Statistician.  Reproductive health. Screening  You may have the following tests or measurements:  Height, weight, and BMI.  Blood pressure.  Lipid and cholesterol levels. These may be checked every 5 years, or more frequently if you are over 32 years old.  Skin check.  Lung cancer screening. You may have this screening every year starting at age 79 if you have a 30-pack-year history of smoking and currently smoke or have quit within the past 15 years.  Fecal occult blood test (FOBT) of the stool. You may have this test every year starting at age 79.  Flexible sigmoidoscopy or colonoscopy. You may have a sigmoidoscopy every 5 years or a colonoscopy every 10 years starting at age 79.  Hepatitis C blood test.  Hepatitis B blood test.  Sexually transmitted disease (STD) testing.  Diabetes screening. This is done by checking your blood sugar (glucose) after you have not eaten for a while (fasting). You may have this done every 1-3 years.  Bone density scan. This is done to screen for osteoporosis. You may have this done starting at age 79.  Mammogram. This may be done every 1-2 years. Talk to your health care provider about how often you should have regular mammograms. Talk with your health care provider about your test  results, treatment options, and if necessary, the need for more tests. Vaccines  Your health care provider may recommend certain vaccines, such as:  Influenza vaccine. This is recommended every year.  Tetanus, diphtheria, and acellular pertussis (Tdap, Td) vaccine. You may need a Td booster every 10 years.  Zoster vaccine. You may need this  after age 49.  Pneumococcal 13-valent conjugate (PCV13) vaccine. One dose is recommended after age 79.  Pneumococcal polysaccharide (PPSV23) vaccine. One dose is recommended after age 79. Talk to your health care provider about which screenings and vaccines you need and how often you need them. This information is not intended to replace advice given to you by your health care provider. Make sure you discuss any questions you have with your health care provider. Document Released: 07/11/2015 Document Revised: 03/03/2016 Document Reviewed: 04/15/2015 Elsevier Interactive Patient Education  2017 [place] Prevention in the Home Falls can cause injuries. They can happen to people of all ages. There are many things you can do to make your home safe and to help prevent falls. What can I do on the outside of my home?  Regularly fix the edges of walkways and driveways and fix any cracks.  Remove anything that might make you trip as you walk through a door, such as a raised step or threshold.  Trim any bushes or trees on the path to your home.  Use bright outdoor lighting.  Clear any walking paths of anything that might make someone trip, such as rocks or tools.  Regularly check to see if handrails are loose or broken. Make sure that both sides of any steps have handrails.  Any raised decks and porches should have guardrails on the edges.  Have any leaves, snow, or ice cleared regularly.  Use sand or salt on walking paths during winter.  Clean up any spills in your garage right away. This includes oil or grease spills. What can I do in the bathroom?  Use night lights.  Install grab bars by the toilet and in the tub and shower. Do not use towel bars as grab bars.  Use non-skid mats or decals in the tub or shower.  If you need to sit down in the shower, use a plastic, non-slip stool.  Keep the floor dry. Clean up any water that spills on the floor as soon as it  happens.  Remove soap buildup in the tub or shower regularly.  Attach bath mats securely with double-sided non-slip rug tape.  Do not have throw rugs and other things on the floor that can make you trip. What can I do in the bedroom?  Use night lights.  Make sure that you have a light by your bed that is easy to reach.  Do not use any sheets or blankets that are too big for your bed. They should not hang down onto the floor.  Have a firm chair that has side arms. You can use this for support while you get dressed.  Do not have throw rugs and other things on the floor that can make you trip. What can I do in the kitchen?  Clean up any spills right away.  Avoid walking on wet floors.  Keep items that you use a lot in easy-to-reach places.  If you need to reach something above you, use a strong step stool that has a grab bar.  Keep electrical cords out of the way.  Do not use floor polish or wax that makes  floors slippery. If you must use wax, use non-skid floor wax.  Do not have throw rugs and other things on the floor that can make you trip. What can I do with my stairs?  Do not leave any items on the stairs.  Make sure that there are handrails on both sides of the stairs and use them. Fix handrails that are broken or loose. Make sure that handrails are as long as the stairways.  Check any carpeting to make sure that it is firmly attached to the stairs. Fix any carpet that is loose or worn.  Avoid having throw rugs at the top or bottom of the stairs. If you do have throw rugs, attach them to the floor with carpet tape.  Make sure that you have a light switch at the top of the stairs and the bottom of the stairs. If you do not have them, ask someone to add them for you. What else can I do to help prevent falls?  Wear shoes that:  Do not have high heels.  Have rubber bottoms.  Are comfortable and fit you well.  Are closed at the toe. Do not wear sandals.  If you  use a stepladder:  Make sure that it is fully opened. Do not climb a closed stepladder.  Make sure that both sides of the stepladder are locked into place.  Ask someone to hold it for you, if possible.  Clearly mark and make sure that you can see:  Any grab bars or handrails.  First and last steps.  Where the edge of each step is.  Use tools that help you move around (mobility aids) if they are needed. These include:  Canes.  Walkers.  Scooters.  Crutches.  Turn on the lights when you go into a dark area. Replace any light bulbs as soon as they burn out.  Set up your furniture so you have a clear path. Avoid moving your furniture around.  If any of your floors are uneven, fix them.  If there are any pets around you, be aware of where they are.  Review your medicines with your doctor. Some medicines can make you feel dizzy. This can increase your chance of falling. Ask your doctor what other things that you can do to help prevent falls. This information is not intended to replace advice given to you by your health care provider. Make sure you discuss any questions you have with your health care provider. Document Released: 04/10/2009 Document Revised: 11/20/2015 Document Reviewed: 07/19/2014 Elsevier Interactive Patient Education  2017 [employer].

## 2018-03-27 NOTE — Progress Notes (Signed)
Subjective:   Renee Collins is a 77 y.o. female who presents for Medicare Annual (Subsequent) preventive examination.  Review of Systems:   Cardiac Risk Factors include: advanced age (>4mn, >>24women);diabetes mellitus;dyslipidemia;hypertension;obesity (BMI >30kg/m2)     Objective:     Vitals: BP 124/78   Pulse 72   Resp 16   Ht 5' 5" (1.651 m)   Wt 199 lb (90.3 kg)   SpO2 100%   BMI 33.12 kg/m   Body mass index is 33.12 kg/m.  Advanced Directives 03/27/2018 02/09/2018 08/10/2017 06/30/2017 02/07/2017 10/25/2016 10/08/2016  Does Patient Have a Medical Advance Directive? No Yes Yes Yes No No No  Type of Advance Directive - Healthcare Power of [place]- - -  Does patient want to make changes to medical advance directive? - No - Patient declined - - - - -  Copy of [place] Chart? - No - copy requested No - copy requested No - copy requested - - -  Would patient like information on creating a medical advance directive? Yes (ED - Information included in AVS) - No - Patient declined - No - Patient declined Yes (MAU/Ambulatory/Procedural Areas - Information given) No - Patient declined  Pre-existing out of facility DNR order (yellow form or pink MOST form) - - - - - - -    Tobacco Social History   Tobacco Use  Smoking Status Never Smoker  Smokeless Tobacco Never Used     Counseling given: Not Answered   Clinical Intake:  Pre-visit preparation completed: Yes  Pain : 0-10 Pain Score: 7  Pain Type: Acute pain Pain Location: Hip Pain Orientation: Right Pain Onset: In the past 7 days     Nutritional Status: BMI > 30  Obese Diabetes: Yes CBG done?: No Did pt. 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 Anemia   . Chronic back pain   . Depression   . Diabetes mellitus   . GERD (gastroesophageal reflux disease)   . Hyperlipidemia   . Hypertension   . Hypothyroidism    Past Surgical History:  Procedure Laterality Date  . BONE MARROW ASPIRATION Left 05/16/15  . BONE MARROW BIOPSY Left 05/16/15  . COLONOSCOPY  03/30/2012   Procedure: COLONOSCOPY;  Surgeon: NRogene Houston MD;  [facility];  Service: Endoscopy;  Laterality: N/A;  730  . COLONOSCOPY N/A 06/30/2017   Procedure: COLONOSCOPY;  Surgeon: RRogene Houston MD;  [facility];  Service: Endoscopy;  Laterality: N/A;  2:00  . LAMINECTOMY  1980's  . POLYPECTOMY  06/30/2017   Procedure: POLYPECTOMY;  Surgeon: RRogene Houston MD;  [facility]: [facility];  Service: Endoscopy;;  colon  . SPINE SURGERY  approx 1983   dr bJoya Salm . TOTAL ABDOMINAL HYSTERECTOMY  approx 1993   fibroids    Family History  Problem Relation Age of Onset  . Heart attack Mother 876 . Heart disease Mother   . Cancer Mother        type unknown  . Diabetes Mother   .  Kidney failure Father   . Kidney disease Father   . Myasthenia gravis Brother   . Diabetes Sister   . Breast cancer Sister 78  . Heart disease Brother   . Diabetes Sister   . Sickle cell anemia Sister   . Cancer Sister        unknown type   Social History   Socioeconomic History  . Marital status: Divorced    Spouse name: Not on file  . Number of children: 3  . Years of education: Not on file  . Highest education level: Not on file  Occupational History  . Occupation: LPN  Social Needs  . Financial resource strain: Not hard at all  . Food insecurity:    Worry: Never true    Inability: Never true  . Transportation needs:    Medical: No    Non-medical: No  Tobacco Use  . Smoking status: Never  Smoker  . Smokeless tobacco: Never Used  Substance and Sexual Activity  . Alcohol use: No  . Drug use: No  . Sexual activity: Not Currently  Lifestyle  . Physical activity:    Days per week: 5 days    Minutes per session: 40 min  . Stress: Not at all  Relationships  . Social connections:    Talks on phone: More than three times a week    Gets together: More than three times a week    Attends religious service: More than 4 times per year    Active member of club or organization: No    Attends meetings of clubs or organizations: Never    Relationship status: Divorced  Other Topics Concern  . Not on file  Social History Narrative   Pt has 2 living adult children, 1 desease at age 65 secondary congential heart disease     Outpatient Encounter Medications as of 03/27/2018  Medication Sig  . acetaminophen (TYLENOL 8 HOUR ARTHRITIS PAIN) 650 MG CR tablet Take 650 mg by mouth every 8 (eight) hours as needed for pain.  Marland Kitchen amLODipine (NORVASC) 10 MG tablet TAKE 1 TABLET EVERY DAY  . aspirin (ASPIRIN ADULT LOW STRENGTH) 81 MG EC tablet Take 1 tablet (81 mg total) by mouth daily.  . benazepril (LOTENSIN) 20 MG tablet TAKE 1 TABLET EVERY DAY  . Calcium-Magnesium-Vitamin D (CALCIUM 1200+D3 PO) Take 1 tablet by mouth 2 (two) times daily.  . diphenhydrAMINE (BENADRYL) 25 mg capsule Take 25 mg by mouth at bedtime as needed (for allergies/sleep.).   Marland Kitchen gabapentin (NEURONTIN) 100 MG capsule Take 100-200 mg by mouth at bedtime as needed (FOR PAIN).   . hydrochlorothiazide (HYDRODIURIL) 25 MG tablet Take 1 tablet (25 mg total) by mouth daily.  Marland Kitchen levothyroxine (SYNTHROID, LEVOTHROID) 50 MCG tablet TAKE 1 AND 1/2 TABLETS ONE TIME DAILY 30 MINUTES TO 1 HOUR BEFORE BREAKFAST ON AN EMPTY STOMACH  . Multiple Vitamin (MULTIVITAMIN WITH MINERALS) TABS tablet Take 1 tablet by mouth daily.  Marland Kitchen omeprazole (PRILOSEC) 20 MG capsule Take 20 mg by mouth daily as needed (for acid reflux.).   Marland Kitchen polyethylene glycol  powder (GLYCOLAX/MIRALAX) powder Take 17 g by mouth daily as needed (FOR CONSTIPATION.).  Marland Kitchen pravastatin (PRAVACHOL) 20 MG tablet Take 1 tablet (20 mg total) by mouth every evening.  . [DISCONTINUED] hydrochlorothiazide (HYDRODIURIL) 25 MG tablet TAKE 1 TABLET EVERY DAY  . [DISCONTINUED] pravastatin (PRAVACHOL) 20 MG tablet TAKE 1 TABLET (20 MG TOTAL) BY MOUTH EVERY EVENING.   No facility-administered encounter medications on file as  of 03/27/2018.     Activities of Daily Living In your present state of health, do you have any difficulty performing the following activities: 03/27/2018  Vision? Y  Difficulty concentrating or making decisions? N  Walking or climbing stairs? Y  Dressing or bathing? N  Doing errands, shopping? N  Preparing Food and eating ? N  Using the Toilet? N  In the past six months, have you accidently leaked urine? N  Do you have problems with loss of bowel control? N  Managing your Medications? N  Managing your Finances? N  Housekeeping or managing your Housekeeping? N  Some recent data might be hidden    Patient Care Team: [name], MD as PCP - General [place], Kelby Fam, MD (Inactive) as Consulting Physician (Hematology and Oncology) Rutherford Guys, MD as Consulting Physician (Ophthalmology)    Assessment:   This is a routine wellness examination for [place].  Exercise Activities and Dietary recommendations Current Exercise Habits: Structured exercise class, Time (Minutes): 40, Frequency (Times/Week): 2, Weekly Exercise (Minutes/Week): 80, Intensity: Moderate  Goals    . Have 3 meals a day     Recommend eating 3 balanced meals a day.       Fall Risk Fall Risk  03/27/2018 11/24/2017 10/25/2016 01/14/2016 09/04/2015  Falls in the past year? _0   Risk for fall due to : - - - - -  Risk for fall due to: Comment - - - - -   Is the patient's home free of loose throw rugs in walkways, pet beds, electrical cords, etc?   yes      Grab bars in  the bathroom? yes      Handrails on the stairs?   yes      Adequate lighting?   yes  Timed Get Up and Go performed:   Depression Screen PHQ 2/9 Scores 03/27/2018 11/24/2017 11/04/2016 10/25/2016  PHQ - 2 Score 0 1 0 0  PHQ- 9 Score 3 - - -     Cognitive Function     6CIT Screen 03/27/2018 10/25/2016  What Year? 0 points 0 points  What month? 0 points 0 points  What time? 0 points 0 points  Count back from 20 0 points 0 points  Months in reverse 0 points 0 points  Repeat phrase 4 points 0 points  Total Score 4 0    Immunization History  Administered Date(s) Administered  . Influenza Split 04/28/2011, 04/12/2012  . Influenza,inj,Quad PF,6+ Mos 04/09/2013, 05/02/2014, 03/11/2015, 05/28/2016, 03/22/2017, 03/27/2018  . Pneumococcal Conjugate-13 09/30/2014  . Pneumococcal Polysaccharide-23 04/28/2011  . Tdap 04/28/2011  . Zoster 08/26/2011    Qualifies for Shingles Vaccine? Ask insurance if covered   Screening Tests Health Maintenance  Topic Date Due  . INFLUENZA VACCINE  01/26/2018  . HEMOGLOBIN A1C  05/27/2018  . OPHTHALMOLOGY EXAM  05/30/2018  . FOOT EXAM  06/12/2018  . TETANUS/TDAP  04/27/2021  . COLONOSCOPY  06/30/2022  . DEXA SCAN  Completed  . PNA vac Low Risk Adult  Completed    Cancer Screenings: Lung: Low Dose CT Chest recommended if Age 34-80 years, 30 pack-year currently smoking OR have quit w/in 15years. Patient does not qualify. Breast:  Up to date on Mammogram? Yes   Up to date of Bone Density/Dexa? Yes Colorectal: up to date   Additional Screenings:  Hepatitis C Screening:      Plan:      I have personally reviewed and noted the following in the  patient's chart:   . Medical and social history . Use of alcohol, tobacco or illicit drugs  . Current medications and supplements . Functional ability and status . Nutritional status . Physical activity . Advanced directives . List of other physicians . Hospitalizations, surgeries, and ER visits in  previous 12 months . Vitals . Screenings to include cognitive, depression, and falls . Referrals and appointments  In addition, I have reviewed and discussed with patient certain preventive protocols, quality metrics, and best practice recommendations. A written personalized care plan for preventive services as well as general preventive health recommendations were provided to patient.     Brandi Hudy, LPN, LPN  03/27/2018     

## 2018-03-27 NOTE — Telephone Encounter (Signed)
Stated her next visit with me was this pm at 4 pm, she was not on the sched and not seen.  nothing else in system  Needs asap fasting lipid, cmp and EGFr, TSH and HBa1C 

## 2018-03-28 ENCOUNTER — Encounter: Payer: Self-pay | Admitting: Family Medicine

## 2018-03-28 ENCOUNTER — Ambulatory Visit (INDEPENDENT_AMBULATORY_CARE_PROVIDER_SITE_OTHER): Payer: Medicare HMO | Admitting: Family Medicine

## 2018-03-28 ENCOUNTER — Other Ambulatory Visit: Payer: Self-pay

## 2018-03-28 VITALS — BP 124/64 | HR 73 | Resp 12 | Ht 65.0 in | Wt 199.0 lb

## 2018-03-28 DIAGNOSIS — E66811 Obesity, class 1: Secondary | ICD-10-CM

## 2018-03-28 DIAGNOSIS — E669 Obesity, unspecified: Secondary | ICD-10-CM | POA: Diagnosis not present

## 2018-03-28 DIAGNOSIS — I10 Essential (primary) hypertension: Secondary | ICD-10-CM

## 2018-03-28 DIAGNOSIS — M5431 Sciatica, right side: Secondary | ICD-10-CM | POA: Diagnosis not present

## 2018-03-28 DIAGNOSIS — E118 Type 2 diabetes mellitus with unspecified complications: Secondary | ICD-10-CM

## 2018-03-28 DIAGNOSIS — E039 Hypothyroidism, unspecified: Secondary | ICD-10-CM

## 2018-03-28 DIAGNOSIS — E785 Hyperlipidemia, unspecified: Secondary | ICD-10-CM | POA: Diagnosis not present

## 2018-03-28 MED ORDER — METHYLPREDNISOLONE ACETATE 80 MG/ML IJ SUSP
80.0000 mg | Freq: Once | INTRAMUSCULAR | Status: AC
  Administered 2018-03-28: 80 mg via INTRAMUSCULAR

## 2018-03-28 MED ORDER — PREDNISONE 10 MG (21) PO TBPK
ORAL_TABLET | ORAL | 0 refills | Status: DC
Start: 2018-03-28 — End: 2018-06-12

## 2018-03-28 NOTE — Patient Instructions (Addendum)
Physical exam as before, call if you need me sooner  Depromedrol 80 mg IM today in office and start prednisone dose pack today as well  You may take 3 gabapentin at bedtime for next 3 to 10 days  Pls get fasting lipid, cmp and EGFr, TSH and HBA1c first week in November, ansd I will refer you back to Dr Birdie Sons of kidneys

## 2018-03-28 NOTE — Telephone Encounter (Signed)
PATIENT IS HERE NOW FOR APPT SO WILL ORDER LABS

## 2018-04-02 ENCOUNTER — Encounter: Payer: Self-pay | Admitting: Family Medicine

## 2018-04-02 NOTE — Assessment & Plan Note (Signed)
Uncharged . Patient re-educated about  the importance of commitment to a  minimum of 150 minutes of exercise per week.  The importance of healthy food choices with portion control discussed. Encouraged to start a food diary, count calories and to consider  joining a support group. Sample diet sheets offered. Goals set by the patient for the next several months.   Weight /BMI 03/28/2018 03/27/2018 02/09/2018  WEIGHT 199 lb 0.6 oz 199 lb 196 lb 1.6 oz  HEIGHT 5\' 5"  5\' 5"  -  BMI 33.12 kg/m2 33.12 kg/m2 32.63 kg/m2

## 2018-04-02 NOTE — Assessment & Plan Note (Signed)
Depo Medrol 80 mg im in office followed by prednisone dose pack

## 2018-04-02 NOTE — Assessment & Plan Note (Signed)
Controlled, no change in medication DASH diet and commitment to daily physical activity for a minimum of 30 minutes discussed and encouraged, as a part of hypertension management. The importance of attaining a healthy weight is also discussed.  BP/Weight 03/28/2018 03/27/2018 02/09/2018 11/24/2017 08/10/2017 06/30/2017 16/03/9603  Systolic BP 540 981 191 478 295 97 621  Diastolic BP 64 78 85 72 73 49 68  Wt. (Lbs) 199.04 199 196.1 198 200 - 198.8  BMI 33.12 33.12 32.63 32.95 33.28 - 33.08

## 2018-04-02 NOTE — Progress Notes (Signed)
   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 Denies any new weakness , numbness of extremity, also incontinence No other complains   ROS Denies recent fever or chills. Denies sinus pressure, nasal congestion, ear pain or sore throat. Denies chest congestion, productive cough or wheezing. Denies chest pains, palpitations and leg swelling Denies abdominal pain, nausea, vomiting,diarrhea or constipation.   Denies dysuria, frequency, hesitancy or incontinence.  Denies depression, anxiety or insomnia. Denies skin break down or rash.   PE  BP 124/64 (BP Location: Left Arm, Patient Position: Sitting, Cuff Size: Normal)   Pulse 73   Resp 12   Ht 5\' 5"  (1.651 m)   Wt 199 lb 0.6 oz (90.3 kg)   SpO2 99% Comment: room air  BMI 33.12 kg/m   Patient alert and oriented and in no cardiopulmonary distress.  HEENT: No facial asymmetry, EOMI,   oropharynx pink and moist.  Neck supple no JVD, no mass.  Chest: Clear to auscultation bilaterally.  CVS: S1, S2 no murmurs, no S3.Regular rate.  ABD: Soft non tender.   Ext: No edema  KC:LEXNTZGY reduced  ROM lumbar  spine, and right , hip and adequate in  knees.  Skin: Intact, no ulcerations or rash noted.  Psych: Good eye contact, normal affect. Memory intact not anxious or depressed appearing.  CNS: CN 2-12 intact, power,  normal throughout.no focal deficits noted.   Assessment & Plan  Sciatica of right side  Depo Medrol 80 mg im in office followed by prednisone dose pack  Essential hypertension Controlled, no change in medication DASH diet and commitment to daily physical activity for a minimum of 30 minutes discussed and encouraged, as a part of hypertension management. The importance of  attaining a healthy weight is also discussed.  BP/Weight 03/28/2018 03/27/2018 02/09/2018 11/24/2017 08/10/2017 06/30/2017 17/49/4496  Systolic BP 759 163 846 659 935 97 701  Diastolic BP 64 78 85 72 73 49 68  Wt. (Lbs) 199.04 199 196.1 198 200 - 198.8  BMI 33.12 33.12 32.63 32.95 33.28 - 33.08       Obesity (BMI 30.0-34.9) Uncharged . Patient re-educated about  the importance of commitment to a  minimum of 150 minutes of exercise per week.  The importance of healthy food choices with portion control discussed. Encouraged to start a food diary, count calories and to consider  joining a support group. Sample diet sheets offered. Goals set by the patient for the next several months.   Weight /BMI 03/28/2018 03/27/2018 02/09/2018  WEIGHT 199 lb 0.6 oz 199 lb 196 lb 1.6 oz  HEIGHT 5\' 5"  5\' 5"  -  BMI 33.12 kg/m2 33.12 kg/m2 32.63 kg/m2      Hypothyroidism Controlled , updated lab prior to next visit

## 2018-04-02 NOTE — Assessment & Plan Note (Signed)
Controlled , updated lab prior to next visit

## 2018-05-02 DIAGNOSIS — E039 Hypothyroidism, unspecified: Secondary | ICD-10-CM | POA: Diagnosis not present

## 2018-05-02 DIAGNOSIS — E785 Hyperlipidemia, unspecified: Secondary | ICD-10-CM | POA: Diagnosis not present

## 2018-05-02 DIAGNOSIS — E118 Type 2 diabetes mellitus with unspecified complications: Secondary | ICD-10-CM | POA: Diagnosis not present

## 2018-05-02 DIAGNOSIS — I10 Essential (primary) hypertension: Secondary | ICD-10-CM | POA: Diagnosis not present

## 2018-05-03 ENCOUNTER — Encounter: Payer: Self-pay | Admitting: Family Medicine

## 2018-05-03 LAB — LIPID PANEL
CHOL/HDL RATIO: 2.2 (calc) (ref <5.0)
CHOLESTEROL: 160 mg/dL (ref <200)
HDL: 73 mg/dL (ref >50)
LDL CHOLESTEROL (CALC): 74 mg/dL
NON-HDL CHOLESTEROL (CALC): 87 mg/dL (ref <130)
Triglycerides: 58 mg/dL (ref <150)

## 2018-05-03 LAB — COMPLETE METABOLIC PANEL WITH GFR
AG Ratio: 1.1 (calc) (ref 1.0–2.5)
ALKALINE PHOSPHATASE (APISO): 67 U/L (ref 33–130)
ALT: 22 U/L (ref 6–29)
AST: 23 U/L (ref 10–35)
Albumin: 4 g/dL (ref 3.6–5.1)
BUN/Creatinine Ratio: 19 (calc) (ref 6–22)
BUN: 29 mg/dL — AB (ref 7–25)
CALCIUM: 9.6 mg/dL (ref 8.6–10.4)
CO2: 28 mmol/L (ref 20–32)
CREATININE: 1.51 mg/dL — AB (ref 0.60–0.93)
Chloride: 105 mmol/L (ref 98–110)
GFR, EST NON AFRICAN AMERICAN: 33 mL/min/{1.73_m2} — AB (ref >=60)
GFR, Est African American: 38 mL/min/{1.73_m2} — ABNORMAL LOW (ref >=60)
GLUCOSE: 103 mg/dL — AB (ref 65–99)
Globulin: 3.7 g/dL (calc) (ref 1.9–3.7)
Potassium: 4.1 mmol/L (ref 3.5–5.3)
Sodium: 138 mmol/L (ref 135–146)
Total Bilirubin: 0.3 mg/dL (ref 0.2–1.2)
Total Protein: 7.7 g/dL (ref 6.1–8.1)

## 2018-05-03 LAB — HEMOGLOBIN A1C
EAG (MMOL/L): 8.7 (calc)
HEMOGLOBIN A1C: 7.1 %{Hb} — AB (ref <5.7)
MEAN PLASMA GLUCOSE: 157 (calc)

## 2018-05-03 LAB — TSH: TSH: 2.54 m[IU]/L (ref 0.40–4.50)

## 2018-06-12 ENCOUNTER — Ambulatory Visit (INDEPENDENT_AMBULATORY_CARE_PROVIDER_SITE_OTHER): Payer: Medicare HMO | Admitting: Family Medicine

## 2018-06-12 ENCOUNTER — Encounter: Payer: Self-pay | Admitting: Family Medicine

## 2018-06-12 VITALS — BP 130/70 | HR 104 | Resp 15 | Ht 65.0 in | Wt 201.0 lb

## 2018-06-12 DIAGNOSIS — E039 Hypothyroidism, unspecified: Secondary | ICD-10-CM

## 2018-06-12 DIAGNOSIS — Z0000 Encounter for general adult medical examination without abnormal findings: Secondary | ICD-10-CM | POA: Diagnosis not present

## 2018-06-12 DIAGNOSIS — M25562 Pain in left knee: Secondary | ICD-10-CM | POA: Insufficient documentation

## 2018-06-12 DIAGNOSIS — E118 Type 2 diabetes mellitus with unspecified complications: Secondary | ICD-10-CM

## 2018-06-12 DIAGNOSIS — E785 Hyperlipidemia, unspecified: Secondary | ICD-10-CM

## 2018-06-12 DIAGNOSIS — M8580 Other specified disorders of bone density and structure, unspecified site: Secondary | ICD-10-CM

## 2018-06-12 DIAGNOSIS — I10 Essential (primary) hypertension: Secondary | ICD-10-CM

## 2018-06-12 DIAGNOSIS — G8929 Other chronic pain: Secondary | ICD-10-CM

## 2018-06-12 NOTE — Progress Notes (Signed)
Renee Collins     MRN: [id number]      DOB: [birth year]  HPI: Patient is in for annual physical exam. 2 week h/o swelling of left knee with instability Intermittent abnormal sensation in LUE x 3 weeeks, radiating to left elbow, most recent about 2 days ago, no aggravating factor noted Recent labs, if available are reviewed. Immunization is reviewed , and  updated if needed.   PE: BP 130/70   Pulse (!) 104   Resp 15   Ht 5\' 5"  (1.651 m)   Wt 201 lb (91.2 kg)   SpO2 98%   BMI 33.45 kg/m    Pleasant  female, alert and oriented x 3, in no cardio-pulmonary distress. Afebrile. HEENT No facial trauma or asymetry. Sinuses non tender.  Extra occullar muscles intact, pupils equally reactive to light. External ears normal, tympanic membranes clear. Oropharynx moist, no exudate. Neck: supple, no adenopathy,JVD or thyromegaly.No bruits.  Chest: Clear to ascultation bilaterally.No crackles or wheezes. Non tender to palpation  Breast: No asymetry,no masses or lumps. No tenderness. No nipple discharge or inversion. No axillary or supraclavicular adenopathy  Cardiovascular system; Heart sounds normal,  S1 and  S2 ,no S3.  No murmur, or thrill. Apical beat not displaced Peripheral pulses normal.  Abdomen: Soft, non tender, no organomegaly or masses. No bruits. Bowel sounds normal. No guarding, tenderness or rebound.   Musculoskeletal exam: Full ROM of spine, hips , shoulders and reduced in left  Knee which is swollen and deformed.  No muscle wasting or atrophy.   Neurologic: Cranial nerves 2 to 12 intact. Power, tone ,sensation and reflexes normal throughout. No disturbance in gait. No tremor.  Skin: Intact, no ulceration, erythema , scaling or rash noted. Pigmentation normal throughout  Psych; Normal mood and affect. Judgement and concentration normal   Assessment & Plan:  Annual physical exam Annual exam as documented. Counseling done  re healthy lifestyle  involving commitment to 150 minutes exercise per week, heart healthy diet, and attaining healthy weight.The importance of adequate sleep also discussed. Regular seat belt use and home safety, is also discussed. Changes in health habits are decided on by the patient with goals and time frames  set for achieving them. Immunization and cancer screening needs are specifically addressed at this visit.   Type 2 diabetes mellitus, controlled Updated lab needed at/ before next visit. Renee Collins is reminded of the importance of commitment to daily physical activity for 30 minutes or more, as able and the need to limit carbohydrate intake to 30 to 60 grams per meal to help with blood sugar control.      Renee Collins is reminded of the importance of daily foot exam, annual eye examination, and good blood sugar, blood pressure and cholesterol control.  Diabetic Labs Latest Ref Rng & Units 05/02/2018 01/31/2018 11/24/2017 08/10/2017 06/09/2017  HbA1c <5.7 % of total Hgb 7.1(H) - 6.5(H) - -  Microalbumin Not Estab. ug/mL - - - - 174.7(H)  Micro/Creat Ratio 0.0 - 30.0 mg/g creat - - - - 114.6(H)  Chol <200 mg/dL 160 - 153 - -  HDL >50 mg/dL 73 - 56 - -  Calc LDL mg/dL (calc) 74 - 81 - -  Triglycerides <150 mg/dL 58 - 75 - -  Creatinine 0.60 - 0.93 mg/dL 1.51(H) 1.63(H) 1.41(H) 1.41(H) -   BP/Weight 06/12/2018 03/28/2018 03/27/2018 02/09/2018 11/24/2017 9/44/9675 02/27/6383  Systolic BP 665 993 570 177 939 030 97  Diastolic BP 70 64 78 85 72  73 49  Wt. (Lbs) 201 199.04 199 196.1 198 200 -  BMI 33.45 33.12 33.12 32.63 32.95 33.28 -   Foot/eye exam completion dates Latest Ref Rng & Units 06/12/2018 06/09/2017  Eye Exam No Retinopathy - -  Foot Form Completion - Done Done        Left knee pain 4 week history with instability,and deformity, refer ortho

## 2018-06-12 NOTE — Assessment & Plan Note (Signed)

## 2018-06-12 NOTE — Assessment & Plan Note (Addendum)
4 week history with instability,and deformity, refer ortho

## 2018-06-12 NOTE — Patient Instructions (Addendum)
F/u in 3.5  months, call if you need me before  Fasting lipid, cmp and eGFR, HBA1C. 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 We consider it a privelige to serve you.  Delivering excellent health care in a caring and  compassionate way is our goal.  Partnering with you,  so that together we can achieve this goal is our strategy.

## 2018-06-12 NOTE — Assessment & Plan Note (Signed)
Updated lab needed at/ before next visit. [name] is reminded of the importance of commitment to daily physical activity for 30 minutes or more, as able and the need to limit carbohydrate intake to 30 to 60 grams per meal to help with blood sugar control.      [name] is reminded of the importance of daily foot exam, annual eye examination, and good blood sugar, blood pressure and cholesterol control.  Diabetic Labs Latest Ref Rng & Units 05/02/2018 01/31/2018 11/24/2017 08/10/2017 06/09/2017  HbA1c <5.7 % of total Hgb 7.1(H) - 6.5(H) - -  Microalbumin Not Estab. ug/mL - - - - 174.7(H)  Micro/Creat Ratio 0.0 - 30.0 mg/g creat - - - - 114.6(H)  Chol <200 mg/dL 160 - 153 - -  HDL >50 mg/dL 73 - 56 - -  Calc LDL mg/dL (calc) 74 - 81 - -  Triglycerides <150 mg/dL 58 - 75 - -  Creatinine 0.60 - 0.93 mg/dL 1.51(H) 1.63(H) 1.41(H) 1.41(H) -   BP/Weight 06/12/2018 03/28/2018 03/27/2018 02/09/2018 11/24/2017 10/14/6220 03/05/9891  Systolic BP 119 417 408 144 818 563 97  Diastolic BP 70 64 78 85 72 73 49  Wt. (Lbs) 201 199.04 199 196.1 198 200 -  BMI 33.45 33.12 33.12 32.63 32.95 33.28 -   Foot/eye exam completion dates Latest Ref Rng & Units 06/12/2018 06/09/2017  Eye Exam No Retinopathy - -  Foot Form Completion - Done Done

## 2018-06-14 ENCOUNTER — Encounter (HOSPITAL_COMMUNITY): Payer: Self-pay

## 2018-06-14 ENCOUNTER — Other Ambulatory Visit: Payer: Self-pay | Admitting: Family Medicine

## 2018-06-14 ENCOUNTER — Encounter: Payer: Self-pay | Admitting: Family Medicine

## 2018-06-14 ENCOUNTER — Ambulatory Visit (HOSPITAL_COMMUNITY)
Admission: RE | Admit: 2018-06-14 | Discharge: 2018-06-14 | Disposition: A | Discharge status: Home / Self Care | Payer: Medicare HMO | Source: Ambulatory Visit | Attending: Family Medicine | Admitting: Family Medicine

## 2018-06-14 DIAGNOSIS — M8589 Other specified disorders of bone density and structure, multiple sites: Secondary | ICD-10-CM | POA: Diagnosis not present

## 2018-06-14 DIAGNOSIS — M47816 Spondylosis without myelopathy or radiculopathy, lumbar region: Secondary | ICD-10-CM | POA: Insufficient documentation

## 2018-06-14 DIAGNOSIS — Z1231 Encounter for screening mammogram for malignant neoplasm of breast: Secondary | ICD-10-CM | POA: Diagnosis not present

## 2018-06-14 DIAGNOSIS — M85852 Other specified disorders of bone density and structure, left thigh: Secondary | ICD-10-CM | POA: Insufficient documentation

## 2018-06-14 DIAGNOSIS — M8580 Other specified disorders of bone density and structure, unspecified site: Secondary | ICD-10-CM

## 2018-07-06 ENCOUNTER — Ambulatory Visit (INDEPENDENT_AMBULATORY_CARE_PROVIDER_SITE_OTHER): Payer: Medicare HMO

## 2018-07-06 ENCOUNTER — Encounter: Payer: Self-pay | Admitting: Orthopaedic Surgery

## 2018-07-06 ENCOUNTER — Ambulatory Visit: Payer: Medicare HMO | Admitting: Orthopaedic Surgery

## 2018-07-06 VITALS — BP 143/80 | HR 88 | Ht 65.0 in | Wt 195.0 lb

## 2018-07-06 DIAGNOSIS — G8929 Other chronic pain: Secondary | ICD-10-CM

## 2018-07-06 DIAGNOSIS — M25562 Pain in left knee: Secondary | ICD-10-CM

## 2018-07-06 NOTE — Progress Notes (Signed)
Subjective:    Patient ID: Renee Collins, female    DOB: [birth year], 78 y.o.   MRN: [id number]  HPI She has had pain in the left knee on and off for years. She saw Dr. Aline Brochure about it in 2015.  She did well with diclofenac.  Over the last several months she has developed more pain in the left knee with swelling and feeling it will give way.  She has popping but no locking, no redness.  She has no trauma.  The swelling hurts.  She has seen Dr. Moshe Cipro and I have reviewed her notes.  She is not taking any medicine for the knee presently.   Review of Systems  Constitutional: Positive for activity change.  Musculoskeletal: Positive for arthralgias, back pain, gait problem and joint swelling.  All other systems reviewed and are negative.  For Review of Systems, all other systems reviewed and are negative.  The following is a summary of the past history medically, past history surgically, known current medicines, social history and family history.  This information is gathered electronically by the computer from prior information and documentation.  I review this each visit and have found including this information at this point in the chart is beneficial and informative.   Past Medical History:  Diagnosis Date  . Anemia   . Chronic back pain   . Depression   . Diabetes mellitus   . GERD (gastroesophageal reflux disease)   . Hyperlipidemia   . Hypertension   . Hypothyroidism     Past Surgical History:  Procedure Laterality Date  . BONE MARROW ASPIRATION Left 05/16/15  . BONE MARROW BIOPSY Left 05/16/15  . COLONOSCOPY  03/30/2012   Procedure: COLONOSCOPY;  Surgeon: Rogene Houston, MD;  Location: [facility];  Service: Endoscopy;  Laterality: N/A;  730  . COLONOSCOPY N/A 06/30/2017   Procedure: COLONOSCOPY;  Surgeon: Rogene Houston, MD;  Location: [facility];  Service: Endoscopy;  Laterality: N/A;  2:00  . LAMINECTOMY  1980's  . POLYPECTOMY  06/30/2017   Procedure: POLYPECTOMY;   Surgeon: Rogene Houston, MD;  Location: [facility];  Service: Endoscopy;;  colon  . SPINE SURGERY  approx 1983   dr Joya Salm  . TOTAL ABDOMINAL HYSTERECTOMY  approx 1993   fibroids     Current Outpatient Medications on File Prior to Visit  Medication Sig Dispense Refill  . acetaminophen (TYLENOL 8 HOUR ARTHRITIS PAIN) 650 MG CR tablet Take 650 mg by mouth every 8 (eight) hours as needed for pain.    Marland Kitchen amLODipine (NORVASC) 10 MG tablet TAKE 1 TABLET EVERY DAY 90 tablet 1  . aspirin (ASPIRIN ADULT LOW STRENGTH) 81 MG EC tablet Take 1 tablet (81 mg total) by mouth daily. 30 tablet 12  . benazepril (LOTENSIN) 20 MG tablet TAKE 1 TABLET EVERY DAY 90 tablet 1  . Calcium-Magnesium-Vitamin D (CALCIUM 1200+D3 PO) Take 1 tablet by mouth 2 (two) times daily.    . capsaicin (ZOSTRIX) 0.025 % cream Apply 1 application topically as directed.    . diphenhydrAMINE (BENADRYL) 25 mg capsule Take 25 mg by mouth at bedtime as needed (for allergies/sleep.).     Marland Kitchen gabapentin (NEURONTIN) 100 MG capsule Take 100-200 mg by mouth 3 (three) times daily.     . hydrochlorothiazide (HYDRODIURIL) 25 MG tablet Take 1 tablet (25 mg total) by mouth daily. 90 tablet 1  . levothyroxine (SYNTHROID, LEVOTHROID) 50 MCG tablet TAKE 1 AND 1/2 TABLETS ONE TIME DAILY  30 MINUTES TO 1 HOUR BEFORE BREAKFAST ON AN EMPTY STOMACH 135 tablet 1  . Multiple Vitamin (MULTIVITAMIN WITH MINERALS) TABS tablet Take 1 tablet by mouth daily.    Marland Kitchen omeprazole (PRILOSEC) 20 MG capsule Take 20 mg by mouth daily as needed (for acid reflux.).     Marland Kitchen polyethylene glycol powder (GLYCOLAX/MIRALAX) powder Take 17 g by mouth daily as needed (FOR CONSTIPATION.).    Marland Kitchen pravastatin (PRAVACHOL) 20 MG tablet Take 1 tablet (20 mg total) by mouth every evening. 90 tablet 1   No current facility-administered medications on file prior to visit.     Social History   Socioeconomic History  . Marital status: Divorced    Spouse name: Not on file  . Number of  children: 3  . Years of education: Not on file  . Highest education level: Not on file  Occupational History  . Occupation: LPN  Social Needs  . Financial resource strain: Not hard at all  . Food insecurity:    Worry: Never true    Inability: Never true  . Transportation needs:    Medical: No    Non-medical: No  Tobacco Use  . Smoking status: Never Smoker  . Smokeless tobacco: Never Used  Substance and Sexual Activity  . Alcohol use: No  . Drug use: No  . Sexual activity: Not Currently  Lifestyle  . Physical activity:    Days per week: 5 days    Minutes per session: 40 min  . Stress: Not at all  Relationships  . Social connections:    Talks on phone: More than three times a week    Gets together: More than three times a week    Attends religious service: More than 4 times per year    Active member of club or organization: No    Attends meetings of clubs or organizations: Never    Relationship status: Divorced  . Intimate partner violence:    Fear of current or ex partner: No    Emotionally abused: No    Physically abused: No    Forced sexual activity: No  Other Topics Concern  . Not on file  Social History Narrative   Pt has 2 living adult children, 1 desease at age 35 secondary congential heart disease     Family History  Problem Relation Age of Onset  . Heart attack Mother 107  . Heart disease Mother   . Cancer Mother        type unknown  . Diabetes Mother   . Kidney failure Father   . Kidney disease Father   . Myasthenia gravis Brother   . Diabetes Sister   . Breast cancer Sister 36  . Heart disease Brother   . Diabetes Sister   . Sickle cell anemia Sister   . Cancer Sister        unknown type    BP (!) 143/80   Pulse 88   Ht '5\' 5"'$  (1.651 m)   Wt 195 lb (88.5 kg)   BMI 32.45 kg/m   Body mass index is 32.45 kg/m.     Objective:   Physical Exam Constitutional:      Appearance: She is well-developed.  HENT:     Head: Normocephalic and  atraumatic.  Eyes:     Conjunctiva/sclera: Conjunctivae normal.     Pupils: Pupils are equal, round, and reactive to light.  Neck:     Musculoskeletal: Normal range of motion and neck supple.  Cardiovascular:  Rate and Rhythm: Normal rate and regular rhythm.  Pulmonary:     Effort: Pulmonary effort is normal.  Abdominal:     Palpations: Abdomen is soft.  Musculoskeletal:     Left knee: She exhibits swelling and effusion. Tenderness found. Medial joint line tenderness noted.       Legs:  Skin:    General: Skin is warm and dry.  Neurological:     Mental Status: She is alert and oriented to person, place, and time.     Cranial Nerves: No cranial nerve deficit.     Motor: No abnormal muscle tone.     Coordination: Coordination normal.     Deep Tendon Reflexes: Reflexes are normal and symmetric. Reflexes normal.  Psychiatric:        Behavior: Behavior normal.        Thought Content: Thought content normal.        Judgment: Judgment normal.       X-rays were done of the left knee, reported separately.    Assessment & Plan:   Encounter Diagnosis  Name Primary?  . Chronic pain of left knee Yes   PROCEDURE NOTE:  The patient requests injections of the left knee , verbal consent was obtained.  The left knee was prepped appropriately after time out was performed.   Sterile technique was observed and injection of 1 cc of Depo-Medrol 40 mg with several cc's of plain xylocaine. Anesthesia was provided by ethyl chloride and a 20-gauge needle was used to inject the knee area. The injection was tolerated well.  A band aid dressing was applied.  The patient was advised to apply ice later today and tomorrow to the injection sight as needed.  I will see her back in one month.  She says she has stomach problems and cannot take NSAIDs.  Call if any problem.  Precautions discussed.  She may need MRI of the left knee.  Electronically Signed Sanjuana Kava, MD 1/9/20209:10  AM

## 2018-08-07 ENCOUNTER — Inpatient Hospital Stay (HOSPITAL_COMMUNITY): Payer: Medicare HMO | Attending: Hematology

## 2018-08-07 DIAGNOSIS — D472 Monoclonal gammopathy: Secondary | ICD-10-CM | POA: Diagnosis not present

## 2018-08-07 LAB — CBC WITH DIFFERENTIAL/PLATELET
Abs Immature Granulocytes: 0.01 10*3/uL (ref 0.00–0.07)
Basophils Absolute: 0 10*3/uL (ref 0.0–0.1)
Basophils Relative: 1 %
Eosinophils Absolute: 0.3 10*3/uL (ref 0.0–0.5)
Eosinophils Relative: 7 %
HCT: 35.4 % — ABNORMAL LOW (ref 36.0–46.0)
Hemoglobin: 10.8 g/dL — ABNORMAL LOW (ref 12.0–15.0)
Immature Granulocytes: 0 %
Lymphocytes Relative: 37 %
Lymphs Abs: 1.6 10*3/uL (ref 0.7–4.0)
MCH: 27.1 pg (ref 26.0–34.0)
MCHC: 30.5 g/dL (ref 30.0–36.0)
MCV: 88.7 fL (ref 80.0–100.0)
Monocytes Absolute: 0.3 10*3/uL (ref 0.1–1.0)
Monocytes Relative: 8 %
Neutro Abs: 2.1 10*3/uL (ref 1.7–7.7)
Neutrophils Relative %: 47 %
Platelets: 213 10*3/uL (ref 150–400)
RBC: 3.99 MIL/uL (ref 3.87–5.11)
RDW: 12.9 % (ref 11.5–15.5)
WBC: 4.3 10*3/uL (ref 4.0–10.5)
nRBC: 0 % (ref 0.0–0.2)

## 2018-08-07 LAB — COMPREHENSIVE METABOLIC PANEL
ALT: 13 U/L (ref 0–44)
AST: 22 U/L (ref 15–41)
Albumin: 3.7 g/dL (ref 3.5–5.0)
Alkaline Phosphatase: 55 U/L (ref 38–126)
Anion gap: 9 (ref 5–15)
BUN: 20 mg/dL (ref 8–23)
CO2: 22 mmol/L (ref 22–32)
Calcium: 9.1 mg/dL (ref 8.9–10.3)
Chloride: 108 mmol/L (ref 98–111)
Creatinine, Ser: 1.45 mg/dL — ABNORMAL HIGH (ref 0.44–1.00)
GFR calc Af Amer: 40 mL/min — ABNORMAL LOW (ref >60)
GFR calc non Af Amer: 34 mL/min — ABNORMAL LOW (ref >60)
Glucose, Bld: 137 mg/dL — ABNORMAL HIGH (ref 70–99)
Potassium: 3.4 mmol/L — ABNORMAL LOW (ref 3.5–5.1)
Sodium: 139 mmol/L (ref 135–145)
Total Bilirubin: 0.3 mg/dL (ref 0.3–1.2)
Total Protein: 8.3 g/dL — ABNORMAL HIGH (ref 6.5–8.1)

## 2018-08-07 LAB — LACTATE DEHYDROGENASE: LDH: 152 U/L (ref 98–192)

## 2018-08-08 LAB — PROTEIN ELECTROPHORESIS, SERUM
A/G RATIO SPE: 0.9 (ref 0.7–1.7)
Albumin ELP: 3.6 g/dL (ref 2.9–4.4)
Alpha-1-Globulin: 0.3 g/dL (ref 0.0–0.4)
Alpha-2-Globulin: 0.9 g/dL (ref 0.4–1.0)
Beta Globulin: 1 g/dL (ref 0.7–1.3)
GLOBULIN, TOTAL: 4.1 g/dL — AB (ref 2.2–3.9)
Gamma Globulin: 1.9 g/dL — ABNORMAL HIGH (ref 0.4–1.8)
M-Spike, %: 1.6 g/dL — ABNORMAL HIGH
Total Protein ELP: 7.7 g/dL (ref 6.0–8.5)

## 2018-08-08 LAB — IGG, IGA, IGM
IGA: 84 mg/dL (ref 64–422)
IgG (Immunoglobin G), Serum: 2326 mg/dL — ABNORMAL HIGH (ref 700–1600)
IgM (Immunoglobulin M), Srm: 64 mg/dL (ref 26–217)

## 2018-08-08 LAB — BETA 2 MICROGLOBULIN, SERUM: Beta-2 Microglobulin: 3.7 mg/L — ABNORMAL HIGH (ref 0.6–2.4)

## 2018-08-08 LAB — KAPPA/LAMBDA LIGHT CHAINS
Kappa free light chain: 205.6 mg/L — ABNORMAL HIGH (ref 3.3–19.4)
Kappa, lambda light chain ratio: 14.9 — ABNORMAL HIGH (ref 0.26–1.65)
Lambda free light chains: 13.8 mg/L (ref 5.7–26.3)

## 2018-08-10 ENCOUNTER — Other Ambulatory Visit: Payer: Self-pay | Admitting: Family Medicine

## 2018-08-10 ENCOUNTER — Ambulatory Visit: Payer: Medicare HMO | Admitting: Orthopaedic Surgery

## 2018-08-10 ENCOUNTER — Encounter: Payer: Self-pay | Admitting: Orthopaedic Surgery

## 2018-08-10 VITALS — Ht 65.0 in | Wt 201.0 lb

## 2018-08-10 DIAGNOSIS — M25562 Pain in left knee: Secondary | ICD-10-CM | POA: Diagnosis not present

## 2018-08-10 DIAGNOSIS — G8929 Other chronic pain: Secondary | ICD-10-CM

## 2018-08-10 NOTE — Progress Notes (Signed)
CC:  I have pain of my left knee. I would like an injection.  The patient has chronic pain of the left knee.  There is no recent trauma.  There is no redness.  Injections in the past have helped.  The knee has no redness, has an effusion and crepitus present.  ROM of the left knee is 0-110.  Impression:  Chronic knee pain left  Return: 6 weeks  PROCEDURE NOTE:  The patient requests injections of the left knee, verbal consent was obtained.  The left knee was prepped appropriately after time out was performed.   Sterile technique was observed and injection of 1 cc of Depo-Medrol 40 mg with several cc's of plain xylocaine. Anesthesia was provided by ethyl chloride and a 20-gauge needle was used to inject the knee area. The injection was tolerated well.  A band aid dressing was applied.  The patient was advised to apply ice later today and tomorrow to the injection sight as needed.  Electronically Signed Sanjuana Kava, MD 2/13/20209:41 AM

## 2018-08-14 ENCOUNTER — Inpatient Hospital Stay (HOSPITAL_COMMUNITY): Payer: Medicare HMO | Attending: Hematology | Admitting: Hematology

## 2018-08-14 ENCOUNTER — Encounter (HOSPITAL_COMMUNITY): Payer: Self-pay | Admitting: Hematology

## 2018-08-14 ENCOUNTER — Other Ambulatory Visit: Payer: Self-pay

## 2018-08-14 ENCOUNTER — Other Ambulatory Visit: Payer: Self-pay | Admitting: Family Medicine

## 2018-08-14 VITALS — BP 148/72 | HR 70 | Temp 98.0°F | Resp 16 | Wt 202.0 lb

## 2018-08-14 DIAGNOSIS — I129 Hypertensive chronic kidney disease with stage 1 through stage 4 chronic kidney disease, or unspecified chronic kidney disease: Secondary | ICD-10-CM

## 2018-08-14 DIAGNOSIS — Z809 Family history of malignant neoplasm, unspecified: Secondary | ICD-10-CM | POA: Insufficient documentation

## 2018-08-14 DIAGNOSIS — Z803 Family history of malignant neoplasm of breast: Secondary | ICD-10-CM

## 2018-08-14 DIAGNOSIS — E039 Hypothyroidism, unspecified: Secondary | ICD-10-CM | POA: Diagnosis not present

## 2018-08-14 DIAGNOSIS — D631 Anemia in chronic kidney disease: Secondary | ICD-10-CM

## 2018-08-14 DIAGNOSIS — R5383 Other fatigue: Secondary | ICD-10-CM

## 2018-08-14 DIAGNOSIS — E1122 Type 2 diabetes mellitus with diabetic chronic kidney disease: Secondary | ICD-10-CM | POA: Diagnosis not present

## 2018-08-14 DIAGNOSIS — Z7982 Long term (current) use of aspirin: Secondary | ICD-10-CM

## 2018-08-14 DIAGNOSIS — Z79899 Other long term (current) drug therapy: Secondary | ICD-10-CM | POA: Insufficient documentation

## 2018-08-14 DIAGNOSIS — D472 Monoclonal gammopathy: Secondary | ICD-10-CM | POA: Diagnosis not present

## 2018-08-14 DIAGNOSIS — N189 Chronic kidney disease, unspecified: Secondary | ICD-10-CM | POA: Diagnosis not present

## 2018-08-14 DIAGNOSIS — D649 Anemia, unspecified: Secondary | ICD-10-CM | POA: Insufficient documentation

## 2018-08-14 DIAGNOSIS — K219 Gastro-esophageal reflux disease without esophagitis: Secondary | ICD-10-CM | POA: Diagnosis not present

## 2018-08-14 DIAGNOSIS — N183 Chronic kidney disease, stage 3 (moderate): Secondary | ICD-10-CM

## 2018-08-14 DIAGNOSIS — E785 Hyperlipidemia, unspecified: Secondary | ICD-10-CM

## 2018-08-14 DIAGNOSIS — F329 Major depressive disorder, single episode, unspecified: Secondary | ICD-10-CM

## 2018-08-14 DIAGNOSIS — M549 Dorsalgia, unspecified: Secondary | ICD-10-CM | POA: Insufficient documentation

## 2018-08-14 NOTE — Assessment & Plan Note (Signed)
1.  IgG kappa MGUS: - Bone marrow biopsy on 05/16/2015 shows limited marrow sample with no increase in plasma cells in the very limited material present.  Cytogenetics showed 33, XX.  FISH panel was normal. - Bone survey on 02/22/2017 shows ill-defined calcified lesion in the distal right femur.  MRI of the right femur on 03/02/2017 shows no osseous lesion. - SPEP on 08/07/2018 shows M spike of 1.6, down from 2.26 months ago. -Free light chain ratio was 14.9, kappa free light chains of 205.6, both stable to mild improvement from last time.  Creatinine was at the baseline.  Calcium was normal. -Denies any new onset bone pains.  Denies any recurrent infections.  No B symptoms. - I plan to see her back in 6 months for follow-up.  We will plan to repeat skeletal survey prior to next visit.  2.  CKD: -Creatinine was 1.45.  3.  Normocytic anemia: - Hemoglobin is 10.8. -Denies any bleeding per rectum or melena.  Took iron pills and discontinued in 2018. - We will check her E28, folic acid, ferritin and iron panel prior to next visit.

## 2018-08-14 NOTE — Patient Instructions (Addendum)
[facility] Discharge Instructions  Follow up in 6 months   Thank you for choosing [facility] to provide your oncology and hematology care.  To afford each patient quality time with our provider, please arrive at least 15 minutes before your scheduled appointment time.   If you have a lab appointment with the [facility] please come in thru the  Main Entrance and check in at the main information desk  You need to re-schedule your appointment should you arrive 10 or more minutes late.  We strive to give you quality time with our providers, and arriving late affects you and other patients whose appointments are after yours.  Also, if you no show three or more times for appointments you may be dismissed from the clinic at the providers discretion.     Again, thank you for choosing [facility].  Our hope is that these requests will decrease the amount of time that you wait before being seen by our physicians.       _____________________________________________________________  Should you have questions after your visit to [facility], please contact our office at [phone] between the hours of 8:00 a.m. and 4:30 p.m.  Voicemails left after 4:00 p.m. will not be returned until the following business day.  For prescription refill requests, have your pharmacy contact our office and allow 72 hours.    Cancer Center Support Programs:   > Cancer Support Group  2nd Tuesday of the month 1pm-2pm, Journey Room

## 2018-08-14 NOTE — Progress Notes (Signed)
[place], [place] [address]   CLINIC:  Medical Oncology/Hematology  PCP:  [name], MD [address] [place]  [place] [address] [phone]   REASON FOR VISIT: Follow-up for MGUS  CURRENT THERAPY: Observation   INTERVAL HISTORY:  Renee Collins 78 y.o. female returns for routine follow-up for MGUS. She is doing well since her last visit. She reports she is fatigue throughout the day. She was taken off oral iron about 2 years ago and has not taken anything since. She also has been having intermittent knee pain. Denies any nausea, vomiting, or diarrhea. Denies any new pains. Had not noticed any recent bleeding such as epistaxis, hematuria or hematochezia. Denies recent chest pain on exertion, shortness of breath on minimal exertion, pre-syncopal episodes, or palpitations. Denies any numbness or tingling in hands or feet. Denies any recent fevers, infections, or recent hospitalizations. Patient reports appetite at 100% and energy level at 100%. She is eating well and maintaining her weight.    REVIEW OF SYSTEMS:  Review of Systems  Constitutional: Positive for fatigue.  All other systems reviewed and are negative.    PAST MEDICAL/SURGICAL HISTORY:  Past Medical History:  Diagnosis Date  . Anemia   . Chronic back pain   . Depression   . Diabetes mellitus   . GERD (gastroesophageal reflux disease)   . Hyperlipidemia   . Hypertension   . Hypothyroidism    Past Surgical History:  Procedure Laterality Date  . BONE MARROW ASPIRATION Left 05/16/15  . BONE MARROW BIOPSY Left 05/16/15  . COLONOSCOPY  03/30/2012   Procedure: COLONOSCOPY;  Surgeon: Rogene Houston, MD;  [facility];  Service: Endoscopy;  Laterality: N/A;  730  . COLONOSCOPY N/A 06/30/2017   Procedure: COLONOSCOPY;  Surgeon: Rogene Houston, MD;  [facility];  Service: Endoscopy;  Laterality: N/A;  2:00  . LAMINECTOMY  1980's  . POLYPECTOMY   06/30/2017   Procedure: POLYPECTOMY;  Surgeon: Rogene Houston, MD;  [facility];  Service: Endoscopy;;  colon  . SPINE SURGERY  approx 1983   dr Joya Salm  . TOTAL ABDOMINAL HYSTERECTOMY  approx 1993   fibroids      SOCIAL HISTORY:  Social History   Socioeconomic History  . Marital status: Divorced    Spouse name: Not on file  . Number of children: 3  . Years of education: Not on file  . Highest education level: Not on file  Occupational History  . Occupation: LPN  Social Needs  . Financial resource strain: Not hard at all  . Food insecurity:    Worry: Never true    Inability: Never true  . Transportation needs:    Medical: No    Non-medical: No  Tobacco Use  . Smoking status: Never Smoker  . Smokeless tobacco: Never Used  Substance and Sexual Activity  . Alcohol use: No  . Drug use: No  . Sexual activity: Not Currently  Lifestyle  . Physical activity:    Days per week: 5 days    Minutes per session: 40 min  . Stress: Not at all  Relationships  . Social connections:    Talks on phone: More than three times a week    Gets together: More than three times a week    Attends religious service: More than 4 times per year    Active member of club or organization: No    Attends meetings  of clubs or organizations: Never    Relationship status: Divorced  . Intimate partner violence:    Fear of current or ex partner: No    Emotionally abused: No    Physically abused: No    Forced sexual activity: No  Other Topics Concern  . Not on file  Social History Narrative   Pt has 2 living adult children, 1 desease at age 59 secondary congential heart disease     FAMILY HISTORY:  Family History  Problem Relation Age of Onset  . Heart attack Mother 47  . Heart disease Mother   . Cancer Mother        type unknown  . Diabetes Mother   . Kidney failure Father   . Kidney disease Father   . Myasthenia gravis Brother   . Diabetes Sister   . Breast cancer Sister 69    . Heart disease Brother   . Diabetes Sister   . Sickle cell anemia Sister   . Cancer Sister        unknown type    CURRENT MEDICATIONS:  Outpatient Encounter Medications as of 08/14/2018  Medication Sig  . acetaminophen (TYLENOL 8 HOUR ARTHRITIS PAIN) 650 MG CR tablet Take 650 mg by mouth every 8 (eight) hours as needed for pain.  Marland Kitchen amLODipine (NORVASC) 10 MG tablet TAKE 1 TABLET EVERY DAY  . aspirin (ASPIRIN ADULT LOW STRENGTH) 81 MG EC tablet Take 1 tablet (81 mg total) by mouth daily.  . benazepril (LOTENSIN) 20 MG tablet TAKE 1 TABLET EVERY DAY  . Calcium-Magnesium-Vitamin D (CALCIUM 1200+D3 PO) Take 1 tablet by mouth 2 (two) times daily.  . capsaicin (ZOSTRIX) 0.025 % cream Apply 1 application topically as directed.  . diphenhydrAMINE (BENADRYL) 25 mg capsule Take 25 mg by mouth at bedtime as needed (for allergies/sleep.).   Marland Kitchen gabapentin (NEURONTIN) 100 MG capsule Take 100-200 mg by mouth 3 (three) times daily.   . hydrochlorothiazide (HYDRODIURIL) 25 MG tablet Take 1 tablet (25 mg total) by mouth daily.  Marland Kitchen levothyroxine (SYNTHROID, LEVOTHROID) 50 MCG tablet TAKE 1 AND 1/2 TABLETS ONE TIME DAILY 30 MINUTES TO 1 HOUR BEFORE BREAKFAST ON AN EMPTY STOMACH  . Multiple Vitamin (MULTIVITAMIN WITH MINERALS) TABS tablet Take 1 tablet by mouth daily.  Marland Kitchen omeprazole (PRILOSEC) 20 MG capsule Take 20 mg by mouth daily as needed (for acid reflux.).   Marland Kitchen polyethylene glycol powder (GLYCOLAX/MIRALAX) powder Take 17 g by mouth daily as needed (FOR CONSTIPATION.).  Marland Kitchen pravastatin (PRAVACHOL) 20 MG tablet Take 1 tablet (20 mg total) by mouth every evening.   No facility-administered encounter medications on file as of 08/14/2018.     ALLERGIES:  No Known Allergies   PHYSICAL EXAM:  ECOG Performance status: 1  Vitals:   08/14/18 0800  BP: (!) 148/72  Pulse: 70  Resp: 16  Temp: 98 F (36.7 C)  SpO2: 97%   Filed Weights   08/14/18 0800  Weight: 202 lb (91.6 kg)    Physical  Exam Constitutional:      Appearance: Normal appearance. She is normal weight.  Cardiovascular:     Rate and Rhythm: Normal rate and regular rhythm.     Heart sounds: Normal heart sounds.  Pulmonary:     Effort: Pulmonary effort is normal.     Breath sounds: Normal breath sounds.  Abdominal:     General: Abdomen is flat.     Palpations: Abdomen is soft.  Musculoskeletal: Normal range of motion.  Skin:  General: Skin is warm and dry.  Neurological:     Mental Status: She is alert and oriented to person, place, and time. Mental status is at baseline.  Psychiatric:        Mood and Affect: Mood normal.        Behavior: Behavior normal.        Thought Content: Thought content normal.        Judgment: Judgment normal.    No hepatosplenomegaly.  No lymphadenopathy.  LABORATORY DATA:  I have reviewed the labs as listed.  CBC    Component Value Date/Time   WBC 4.3 08/07/2018 1018   RBC 3.99 08/07/2018 1018   HGB 10.8 (L) 08/07/2018 1018   HCT 35.4 (L) 08/07/2018 1018   PLT 213 08/07/2018 1018   MCV 88.7 08/07/2018 1018   MCH 27.1 08/07/2018 1018   MCHC 30.5 08/07/2018 1018   RDW 12.9 08/07/2018 1018   LYMPHSABS 1.6 08/07/2018 1018   MONOABS 0.3 08/07/2018 1018   EOSABS 0.3 08/07/2018 1018   BASOSABS 0.0 08/07/2018 1018   CMP Latest Ref Rng & Units 08/07/2018 05/02/2018 01/31/2018  Glucose 70 - 99 mg/dL 137(H) 103(H) 140(H)  BUN 8 - 23 mg/dL 20 29(H) 28(H)  Creatinine 0.44 - 1.00 mg/dL 1.45(H) 1.51(H) 1.63(H)  Sodium 135 - 145 mmol/L 139 138 136  Potassium 3.5 - 5.1 mmol/L 3.4(L) 4.1 3.8  Chloride 98 - 111 mmol/L 108 105 109  CO2 22 - 32 mmol/L '22 28 22  '$ Calcium 8.9 - 10.3 mg/dL 9.1 9.6 9.4  Total Protein 6.5 - 8.1 g/dL 8.3(H) 7.7 8.8(H)  Total Bilirubin 0.3 - 1.2 mg/dL 0.3 0.3 0.4  Alkaline Phos 38 - 126 U/L 55 - 59  AST 15 - 41 U/L '22 23 27  '$ ALT 0 - 44 U/L '13 22 22       '$ DIAGNOSTIC IMAGING:  I have independently reviewed the scans and discussed with the  patient.   I have reviewed Renee Finders, NP's note and agree with the documentation.  I personally performed a face-to-face visit, made revisions and my assessment and plan is as follows.    ASSESSMENT & PLAN:   MGUS (monoclonal gammopathy of unknown significance) 1.  IgG kappa MGUS: - Bone marrow biopsy on 05/16/2015 shows limited marrow sample with no increase in plasma cells in the very limited material present.  Cytogenetics showed 32, XX.  FISH panel was normal. - Bone survey on 02/22/2017 shows ill-defined calcified lesion in the distal right femur.  MRI of the right femur on 03/02/2017 shows no osseous lesion. - SPEP on 08/07/2018 shows M spike of 1.6, down from 2.26 months ago. -Free light chain ratio was 14.9, kappa free light chains of 205.6, both stable to mild improvement from last time.  Creatinine was at the baseline.  Calcium was normal. -Denies any new onset bone pains.  Denies any recurrent infections.  No B symptoms. - I plan to see her back in 6 months for follow-up.  We will plan to repeat skeletal survey prior to next visit.  2.  CKD: -Creatinine was 1.45.  3.  Normocytic anemia: - Hemoglobin is 10.8. -Denies any bleeding per rectum or melena.  Took iron pills and discontinued in 2018. - We will check her X91, folic acid, ferritin and iron panel prior to next visit.      Orders placed this encounter:  Orders Placed This Encounter  Procedures  . DG Bone Survey Met  . Lactate dehydrogenase  .  Protein electrophoresis, serum  . Kappa/lambda light chains  . Vitamin B12  . Folate  . CBC with Differential/Platelet  . Comprehensive metabolic panel  . Ferritin  . Iron and TIBC  . VITAMIN D 25 Hydroxy (Vit-D Deficiency, Fractures)      Derek Jack, MD [place] [phone]

## 2018-09-16 ENCOUNTER — Other Ambulatory Visit: Payer: Self-pay | Admitting: Family Medicine

## 2018-09-20 DIAGNOSIS — M8580 Other specified disorders of bone density and structure, unspecified site: Secondary | ICD-10-CM | POA: Diagnosis not present

## 2018-09-20 DIAGNOSIS — E785 Hyperlipidemia, unspecified: Secondary | ICD-10-CM | POA: Diagnosis not present

## 2018-09-20 DIAGNOSIS — E118 Type 2 diabetes mellitus with unspecified complications: Secondary | ICD-10-CM | POA: Diagnosis not present

## 2018-09-20 DIAGNOSIS — I10 Essential (primary) hypertension: Secondary | ICD-10-CM | POA: Diagnosis not present

## 2018-09-21 LAB — CBC
HCT: 35.4 % (ref 35.0–45.0)
HEMOGLOBIN: 11.5 g/dL — AB (ref 11.7–15.5)
MCH: 27.5 pg (ref 27.0–33.0)
MCHC: 32.5 g/dL (ref 32.0–36.0)
MCV: 84.7 fL (ref 80.0–100.0)
MPV: 10.9 fL (ref 7.5–12.5)
Platelets: 241 10*3/uL (ref 140–400)
RBC: 4.18 10*6/uL (ref 3.80–5.10)
RDW: 12.5 % (ref 11.0–15.0)
WBC: 4.8 10*3/uL (ref 3.8–10.8)

## 2018-09-21 LAB — LIPID PANEL
CHOL/HDL RATIO: 2.7 (calc) (ref <5.0)
Cholesterol: 156 mg/dL (ref <200)
HDL: 57 mg/dL (ref >=50)
LDL CHOLESTEROL (CALC): 81 mg/dL
NON-HDL CHOLESTEROL (CALC): 99 mg/dL (ref <130)
TRIGLYCERIDES: 96 mg/dL (ref <150)

## 2018-09-21 LAB — COMPLETE METABOLIC PANEL WITH GFR
AG Ratio: 1.1 (calc) (ref 1.0–2.5)
ALBUMIN MSPROF: 4.2 g/dL (ref 3.6–5.1)
ALKALINE PHOSPHATASE (APISO): 62 U/L (ref 37–153)
ALT: 23 U/L (ref 6–29)
AST: 25 U/L (ref 10–35)
BILIRUBIN TOTAL: 0.4 mg/dL (ref 0.2–1.2)
BUN / CREAT RATIO: 16 (calc) (ref 6–22)
BUN: 21 mg/dL (ref 7–25)
CHLORIDE: 105 mmol/L (ref 98–110)
CO2: 24 mmol/L (ref 20–32)
Calcium: 9.7 mg/dL (ref 8.6–10.4)
Creat: 1.34 mg/dL — ABNORMAL HIGH (ref 0.60–0.93)
GFR, Est African American: 44 mL/min/{1.73_m2} — ABNORMAL LOW (ref >=60)
GFR, Est Non African American: 38 mL/min/{1.73_m2} — ABNORMAL LOW (ref >=60)
GLUCOSE: 118 mg/dL — AB (ref 65–99)
Globulin: 4 g/dL (calc) — ABNORMAL HIGH (ref 1.9–3.7)
Potassium: 3.9 mmol/L (ref 3.5–5.3)
Sodium: 138 mmol/L (ref 135–146)
Total Protein: 8.2 g/dL — ABNORMAL HIGH (ref 6.1–8.1)

## 2018-09-21 LAB — HEMOGLOBIN A1C
HEMOGLOBIN A1C: 7 %{Hb} — AB (ref <5.7)
MEAN PLASMA GLUCOSE: 154 (calc)
eAG (mmol/L): 8.5 (calc)

## 2018-09-21 LAB — TSH: TSH: 2.17 m[IU]/L (ref 0.40–4.50)

## 2018-09-25 ENCOUNTER — Ambulatory Visit: Payer: Medicare HMO | Admitting: Family Medicine

## 2018-09-26 ENCOUNTER — Ambulatory Visit: Payer: Medicare HMO | Admitting: Orthopaedic Surgery

## 2018-09-27 ENCOUNTER — Encounter: Payer: Self-pay | Admitting: Family Medicine

## 2018-09-27 ENCOUNTER — Ambulatory Visit (INDEPENDENT_AMBULATORY_CARE_PROVIDER_SITE_OTHER): Payer: Medicare HMO | Admitting: Family Medicine

## 2018-09-27 ENCOUNTER — Other Ambulatory Visit: Payer: Self-pay

## 2018-09-27 VITALS — BP 119/74 | HR 86 | Ht 65.0 in | Wt 199.0 lb

## 2018-09-27 DIAGNOSIS — N183 Chronic kidney disease, stage 3 unspecified: Secondary | ICD-10-CM

## 2018-09-27 DIAGNOSIS — E669 Obesity, unspecified: Secondary | ICD-10-CM | POA: Diagnosis not present

## 2018-09-27 DIAGNOSIS — I129 Hypertensive chronic kidney disease with stage 1 through stage 4 chronic kidney disease, or unspecified chronic kidney disease: Secondary | ICD-10-CM

## 2018-09-27 DIAGNOSIS — E785 Hyperlipidemia, unspecified: Secondary | ICD-10-CM | POA: Diagnosis not present

## 2018-09-27 DIAGNOSIS — E1122 Type 2 diabetes mellitus with diabetic chronic kidney disease: Secondary | ICD-10-CM | POA: Diagnosis not present

## 2018-09-27 DIAGNOSIS — D631 Anemia in chronic kidney disease: Secondary | ICD-10-CM

## 2018-09-27 DIAGNOSIS — I10 Essential (primary) hypertension: Secondary | ICD-10-CM

## 2018-09-27 DIAGNOSIS — E118 Type 2 diabetes mellitus with unspecified complications: Secondary | ICD-10-CM

## 2018-09-27 DIAGNOSIS — Z79899 Other long term (current) drug therapy: Secondary | ICD-10-CM

## 2018-09-27 NOTE — Patient Instructions (Signed)
    Thank you for completing today's visit via telemedicine. I appreciate the opportunity to provide you with the care for your health and wellness.  Today we discussed: your labs and overall health.  Your labs have improved!! Good job!!  I have placed labs for you to get 1 week before your next appt.  We would like to have you come back in about 4 months time.  Hopefully this visit will be in the office.  Please continue the good work on diet modifications, exercise, and increased water intake. These are definitely helping you.    [place] YOUR HANDS WELL AND FREQUENTLY. AVOID TOUCHING YOUR FACE, UNLESS YOUR HANDS ARE FRESHLY WASHED.  GET FRESH AIR DAILY. STAY HYDRATED WITH WATER.   It was a pleasure to see you and I look forward to continuing to work together on your health and well-being. Please do not hesitate to call the office if you need care or have questions about your care.  Have a wonderful day and week.  With Gratitude,  Cherly Beach, DNP, AGNP-BC

## 2018-09-27 NOTE — Progress Notes (Addendum)
Established Patient Office Visit  Subjective:  Patient ID: Renee Collins, female    DOB: [birth year]  Age: 78 y.o. MRN: [id number]  CC:  Chief Complaint  Patient presents with   follow up for chronic conditions   Location of Patient: Home Location of Provider: Telehealth Consent was obtain for visit to be over via telehealth.  HPI [name] presents for follow-up of chronic conditions.  Renee Collins is a 78 year old female patient of Dr. Griffin Dakin.  Is well-known to the practice.  Was last seen in December 2019 for her annual visit. Today's we discussed her recent labs and pertinent chronic conditions.  Diabetes: Last A1c on March 25 of 2020 was 7%.  Last year she was 7.1% in November.  She reports that she is doing much better with her diet control.  She is also trying to walk more outside.  But due to the current health care pandemic she has had to stay inside more as well as not being able to walk due to rain.  She does endorse improving her diet though.  She has reduce the amount of white granular sugar that she uses.  She now uses honey as a sweetener.  She has also reduced the amount of bread and some other processed food items.  Though she reports that it is much harder since she cannot get to the store more frequently at this time due to being shut in.  She endorses that she is started drinking a lot more water than she was drinking.  She does report that this has helped her a lot more than she realized.  Denies having any signs or symptoms today of polyphagia, polyuria, polydipsia.  Denies having any vision trouble.  Was due to have her vision checked.  But secondary to the virus and reduction in office visit this is been rescheduled.  Chronic kidney disease: Currently GFR has increased to 44.  Creatinine has decreased.  She reports that she is drinking more water this is possibly helping her as far as her kidney function goes.  She also reports that she is taking her blood pressure  medicines as directed.    Anemia: Lab work demonstrates increased red blood cells at 11.5 this is higher than it was last year where she was ranging in the 10's.  She reports that she is feeling much better.  Is been trying to eat better food choices that have iron in them.  Has been trying to hydrate more for her kidney function.  Overall this seems to have improved denies having any excessive fatigue, bleeding in her stool or urine or irregular or spontaneous bleeding elsewhere.  Hypertension: Reports that her blood pressure is 119/74 today at home this morning.  Reports that she is doing well is taking her medication as directed.  Denies having any headaches, vision changes, chest pain, shortness of breath, leg swelling, palpitations.  Obesity: Unable to get a accurate weight check.  But does believe that she is lost a pound or 2 over the last couple of months.  Secondary to increasing her activity and watching her diet.  She can tell with the way that her clothes are currently fitting.  Dyslipidemia: Current panel is good pretty much unchanged from previous panel is maintained on a statin.  LDL is 81 would like to see that closer to 70.  As reported above she has been watching her diet and been trying to work out.  Overall Renee Collins feels good  today on the telehealth telephone visit.  She has no complaints at this time.  She denies having fevers, chills, any sinus and or sore throat.  She denies having chest pain, shortness of breath, wheezing, palpitations, cough.  Past Medical, Surgical, Social History, Allergies, and Medications have been Reviewed.    Past Medical History:  Diagnosis Date   Anemia    Chronic back pain    Depression    Diabetes mellitus    GERD (gastroesophageal reflux disease)    Hyperlipidemia    Hypertension    Hypothyroidism     Past Surgical History:  Procedure Laterality Date   BONE MARROW ASPIRATION Left 05/16/15   BONE MARROW BIOPSY Left 05/16/15    COLONOSCOPY  03/30/2012   Procedure: COLONOSCOPY;  Surgeon: Rogene Houston, MD;  [facility];  Service: Endoscopy;  Laterality: N/A;  730   COLONOSCOPY N/A 06/30/2017   Procedure: COLONOSCOPY;  Surgeon: Rogene Houston, MD;  [facility];  Service: Endoscopy;  Laterality: N/A;  2:00   LAMINECTOMY  1980's   POLYPECTOMY  06/30/2017   Procedure: POLYPECTOMY;  Surgeon: Rogene Houston, MD;  [facility];  Service: Endoscopy;;  colon   SPINE SURGERY  approx 1983   dr Joya Salm   TOTAL ABDOMINAL HYSTERECTOMY  approx 1993   fibroids     Family History  Problem Relation Age of Onset   Heart attack Mother 62   Heart disease Mother    Cancer Mother        type unknown   Diabetes Mother    Kidney failure Father    Kidney disease Father    Myasthenia gravis Brother    Diabetes Sister    Breast cancer Sister 29   Heart disease Brother    Diabetes Sister    Sickle cell anemia Sister    Cancer Sister        unknown type    Social History   Socioeconomic History   Marital status: Divorced    Spouse name: Not on file   Number of children: 3   Years of education: Not on file   Highest education level: Not on file  Occupational History   Occupation: LPN  Social Needs   Financial resource strain: Not hard at all   Food insecurity:    Worry: Never true    Inability: Never true   Transportation needs:    Medical: No    Non-medical: No  Tobacco Use   Smoking status: Never Smoker   Smokeless tobacco: Never Used  Substance and Sexual Activity   Alcohol use: No   Drug use: No   Sexual activity: Not Currently  Lifestyle   Physical activity:    Days per week: 5 days    Minutes per session: 40 min   Stress: Not at all  Relationships   Social connections:    Talks on phone: More than three times a week    Gets together: More than three times a week    Attends religious service: More than 4 times per year    Active  member of club or organization: No    Attends meetings of clubs or organizations: Never    Relationship status: Divorced   Intimate partner violence:    Fear of current or ex partner: No    Emotionally abused: No    Physically abused: No    Forced sexual activity: No  Other Topics Concern   Not on file  Social History Narrative   Pt has 2 living adult children, 1 desease at age 72 secondary congential heart disease     Outpatient Medications Prior to Visit  Medication Sig Dispense Refill   acetaminophen (TYLENOL 8 HOUR ARTHRITIS PAIN) 650 MG CR tablet Take 650 mg by mouth every 8 (eight) hours as needed for pain.     amLODipine (NORVASC) 10 MG tablet TAKE 1 TABLET EVERY DAY 90 tablet 1   aspirin (ASPIRIN ADULT LOW STRENGTH) 81 MG EC tablet Take 1 tablet (81 mg total) by mouth daily. 30 tablet 12   benazepril (LOTENSIN) 20 MG tablet TAKE 1 TABLET EVERY DAY 90 tablet 1   Calcium-Magnesium-Vitamin D (CALCIUM 1200+D3 PO) Take 1 tablet by mouth 2 (two) times daily.     capsaicin (ZOSTRIX) 0.025 % cream Apply 1 application topically as directed.     diphenhydrAMINE (BENADRYL) 25 mg capsule Take 25 mg by mouth at bedtime as needed (for allergies/sleep.).      hydrochlorothiazide (HYDRODIURIL) 25 MG tablet TAKE 1 TABLET EVERY DAY 90 tablet 1   levothyroxine (SYNTHROID, LEVOTHROID) 50 MCG tablet TAKE 1 AND 1/2 TABLETS ONE TIME DAILY 30 MINUTES TO 1 HOUR BEFORE BREAKFAST ON AN EMPTY STOMACH 135 tablet 1   Multiple Vitamin (MULTIVITAMIN WITH MINERALS) TABS tablet Take 1 tablet by mouth daily.     omeprazole (PRILOSEC) 20 MG capsule Take 20 mg by mouth daily as needed (for acid reflux.).      polyethylene glycol powder (GLYCOLAX/MIRALAX) powder Take 17 g by mouth daily as needed (FOR CONSTIPATION.).     pravastatin (PRAVACHOL) 20 MG tablet TAKE 1 TABLET EVERY EVENING 90 tablet 1   gabapentin (NEURONTIN) 100 MG capsule Take 100-200 mg by mouth 3 (three) times daily.      No  facility-administered medications prior to visit.     No Known Allergies  ROS Review of Systems  Constitutional: Negative for activity change, appetite change, chills and fever.  HENT: Negative for congestion, ear discharge, ear pain, sinus pressure, sinus pain, sneezing and sore throat.   Eyes: Negative for pain and redness.  Respiratory: Negative for cough, chest tightness and shortness of breath.   Cardiovascular: Negative for chest pain, palpitations and leg swelling.  Gastrointestinal: Negative for constipation and diarrhea.  Endocrine: Negative.   Genitourinary: Negative for frequency and urgency.  Musculoskeletal: Negative.   Skin: Negative.   Allergic/Immunologic: Negative.   Neurological: Negative for dizziness and facial asymmetry.  Hematological: Negative.   Psychiatric/Behavioral: Negative for confusion, decreased concentration and sleep disturbance. The patient is not nervous/anxious.   All other systems reviewed and are negative.     Objective:    Physical Exam  BP 119/74    Pulse 86    Ht 5' 5" (1.651 m)    Wt 199 lb (90.3 kg)    BMI 33.12 kg/m  Wt Readings from Last 3 Encounters:  09/27/18 199 lb (90.3 kg)  08/14/18 202 lb (91.6 kg)  08/10/18 201 lb (91.2 kg)     Lab Results  Component Value Date   TSH 2.17 09/20/2018   Lab Results  Component Value Date   WBC 4.8 09/20/2018   HGB 11.5 (L) 09/20/2018   HCT 35.4 09/20/2018   MCV 84.7 09/20/2018   PLT 241 09/20/2018   Lab Results  Component Value Date   NA 138 09/20/2018   K 3.9 09/20/2018   CO2 24 09/20/2018   GLUCOSE 118 (H) 09/20/2018   BUN 21 09/20/2018   CREATININE  1.34 (H) 09/20/2018   BILITOT 0.4 09/20/2018   ALKPHOS 55 08/07/2018   AST 25 09/20/2018   ALT 23 09/20/2018   PROT 8.2 (H) 09/20/2018   ALBUMIN 3.7 08/07/2018   CALCIUM 9.7 09/20/2018   ANIONGAP 9 08/07/2018   Lab Results  Component Value Date   CHOL 156 09/20/2018   Lab Results  Component Value Date   HDL 57  09/20/2018   Lab Results  Component Value Date   LDLCALC 81 09/20/2018   Lab Results  Component Value Date   TRIG 96 09/20/2018   Lab Results  Component Value Date   CHOLHDL 2.7 09/20/2018   Lab Results  Component Value Date   HGBA1C 7.0 (H) 09/20/2018      Assessment & Plan:   1. Controlled type 2 diabetes mellitus with complication, without long-term current use of insulin (HCC) Controlled, will check A1c in future.  Advised to continue current regime of walking and monitoring diet.  - Hemoglobin A1c; Standing  2. CKD (chronic kidney disease) stage 3, GFR 30-59 ml/min (HCC) Improved, encouraged to maintain hydration as she has been doing and continue on with taking her medications as ordered and directed.  - COMPLETE METABOLIC PANEL WITH GFR; Standing  3. 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  - CBC with Differential/Platelet; Standing  4. Essential hypertension Controlled, no signs and symptoms of having issues with blood pressure medications and/or with elevated blood pressure.  Advised to continue taking medications as ordered.  5. Obesity (BMI 30.0-34.9) Improved, still overweight at this time.  But is working to do diet and exercise measures.  Advised to continue on with exercise as frequently as she can avoiding walking close to others during this time of social distancing in isolation, if she is outside walking.  Encouraged her to try to continue to eat well as much as possible even though she is not able to shop as frequently as she was.  6. Dyslipidemia Controlled, LDL could be a little bit better would like to see at 70.  Is taking her medication as directed.  Is watching her diet better.  I provided 20 minutes of non-face-to-face time during this encounter.  Follow-up:  4 months or as needed     Perlie Mayo, NP

## 2018-11-04 DIAGNOSIS — Z0100 Encounter for examination of eyes and vision without abnormal findings: Secondary | ICD-10-CM | POA: Diagnosis not present

## 2018-11-04 DIAGNOSIS — H5200 Hypermetropia, unspecified eye: Secondary | ICD-10-CM | POA: Diagnosis not present

## 2018-11-04 LAB — HM DIABETES EYE EXAM

## 2018-11-10 ENCOUNTER — Encounter: Payer: Self-pay | Admitting: *Deleted

## 2019-02-05 ENCOUNTER — Other Ambulatory Visit (HOSPITAL_COMMUNITY)
Admission: AD | Admit: 2019-02-05 | Discharge: 2019-02-05 | Disposition: A | Discharge status: Home / Self Care | Payer: Medicare Other | Source: Skilled Nursing Facility | Attending: Family Medicine | Admitting: Family Medicine

## 2019-02-05 ENCOUNTER — Encounter: Payer: Self-pay | Admitting: Family Medicine

## 2019-02-05 ENCOUNTER — Ambulatory Visit (INDEPENDENT_AMBULATORY_CARE_PROVIDER_SITE_OTHER): Payer: Medicare Other | Admitting: Family Medicine

## 2019-02-05 ENCOUNTER — Other Ambulatory Visit: Payer: Self-pay

## 2019-02-05 VITALS — BP 128/68 | HR 85 | Resp 12 | Ht 65.0 in | Wt 197.0 lb

## 2019-02-05 DIAGNOSIS — E669 Obesity, unspecified: Secondary | ICD-10-CM

## 2019-02-05 DIAGNOSIS — E039 Hypothyroidism, unspecified: Secondary | ICD-10-CM | POA: Diagnosis not present

## 2019-02-05 DIAGNOSIS — D472 Monoclonal gammopathy: Secondary | ICD-10-CM

## 2019-02-05 DIAGNOSIS — Z1231 Encounter for screening mammogram for malignant neoplasm of breast: Secondary | ICD-10-CM | POA: Diagnosis not present

## 2019-02-05 DIAGNOSIS — I10 Essential (primary) hypertension: Secondary | ICD-10-CM

## 2019-02-05 DIAGNOSIS — E118 Type 2 diabetes mellitus with unspecified complications: Secondary | ICD-10-CM

## 2019-02-05 DIAGNOSIS — E559 Vitamin D deficiency, unspecified: Secondary | ICD-10-CM

## 2019-02-05 DIAGNOSIS — E785 Hyperlipidemia, unspecified: Secondary | ICD-10-CM

## 2019-02-05 NOTE — Patient Instructions (Addendum)
Annual physical exam in December when due, call if you we need me sooner  PLEASE SCHEDULE MAMMOGRAM IN DECEMBER AT CHECKOUT  FASTING HBA1C, LIPID, CMP AND EgfR, Tsh AND VIT d TO [facility] TOMORROW  MICROALB FROM OFFICE TODAY  CALL IN 2 TO 5 WEEKS FOR FLU VACCINE  CHECK YOUR PHARMACY RE [facility] AND GET THIS VACCINE AT LEAST 4 WEEKS AFTER THE FLU VACCINE  It is important that you exercise regularly at least 30 minutes 5 times a week. If you develop chest pain, have severe difficulty breathing, or feel very tired, stop exercising immediately and seek medical attention  Think about what you will eat, plan ahead. Choose " clean, green, fresh or frozen" over canned, processed or packaged foods which are more sugary, salty and fatty. 70 to 75% of food eaten should be vegetables and fruit. Three meals at set times with snacks allowed between meals, but they must be fruit or vegetables. Aim to eat over a 12 hour period , example 7 am to 7 pm, and STOP after  your last meal of the day. Drink water,generally about 64 ounces per day, no other drink is as healthy. Fruit juice is best enjoyed in a healthy way, by EATING the fruit.   Thanks for choosing [facility], we consider it a privelige to serve you.

## 2019-02-05 NOTE — Progress Notes (Signed)
[name]     MRN: [id number]      DOB: 08-25-1940   HPI [name] is here for follow up and re-evaluation of chronic medical conditions, medication management and review of any available recent lab and radiology data.  Preventive health is updated, specifically  Cancer screening and Immunization.   Questions or concerns regarding consultations or procedures which the PT has had in the interim are  addressed. The PT denies any adverse reactions to current medications since the last visit.  There are no new concerns.  There are no specific complaints   ROS Denies recent fever or chills. Denies sinus pressure, nasal congestion, ear pain or sore throat. Denies chest congestion, productive cough or wheezing. Denies chest pains, palpitations and leg swelling Denies abdominal pain, nausea, vomiting,diarrhea or constipation.   Denies dysuria, frequency, hesitancy or incontinence. Denies joint pain, swelling and limitation in mobility. Denies headaches, seizures, numbness, or tingling. Denies depression, anxiety or insomnia. Denies skin break down or rash.   PE  BP 128/68   Pulse 85   Resp 12   Ht 5\' 5"  (1.651 m)   Wt 197 lb (89.4 kg)   SpO2 97%   BMI 32.78 kg/m   Patient alert and oriented and in no cardiopulmonary distress.  HEENT: No facial asymmetry, EOMI,   oropharynx pink and moist.  Neck supple no JVD, no mass.  Chest: Clear to auscultation bilaterally.  CVS: S1, S2 no murmurs, no S3.Regular rate.  ABD: Soft non tender.   Ext: No edema  MS: Adequate ROM spine, shoulders, hips and knees.  Skin: Intact, no ulcerations or rash noted.  Psych: Good eye contact, normal affect. Memory intact not anxious or depressed appearing.  CNS: CN 2-12 intact, power,  normal throughout.no focal deficits noted.   Assessment & Plan  Essential hypertension Controlled, no change in medication DASH diet and commitment to daily physical activity for a minimum of 30 minutes  discussed and encouraged, as a part of hypertension management. The importance of attaining a healthy weight is also discussed.  BP/Weight 02/05/2019 09/27/2018 08/14/2018 08/10/2018 07/06/2018 06/12/2018 61/11/735  Systolic BP 106 269 485 - 462 703 500  Diastolic BP 68 74 72 - 80 70 64  Wt. (Lbs) 197 199 202 201 195 201 199.04  BMI 32.78 33.12 33.61 33.45 32.45 33.45 33.12       Hypothyroidism Controlled, no change in medication   Dyslipidemia Hyperlipidemia:Low fat diet discussed and encouraged.   Lipid Panel  Lab Results  Component Value Date   CHOL 156 09/20/2018   HDL 57 09/20/2018   LDLCALC 81 09/20/2018   TRIG 96 09/20/2018   CHOLHDL 2.7 09/20/2018  Controlled, no change in medication Updated lab needed at/ before next visit.      Obesity (BMI 30.0-34.9)  Patient re-educated about  the importance of commitment to a  minimum of 150 minutes of exercise per week as able.  The importance of healthy food choices with portion control discussed, as well as eating regularly and within a 12 hour window most days. The need to choose "clean , green" food 50 to 75% of the time is discussed, as well as to make water the primary drink and set a goal of 64 ounces water daily.    Weight /BMI 02/05/2019 09/27/2018 08/14/2018  WEIGHT 197 lb 199 lb 202 lb  HEIGHT 5\' 5"  5\' 5"  -  BMI 32.78 kg/m2 33.12 kg/m2 33.61 kg/m2      MGUS (monoclonal gammopathy  of unknown significance) Followed by Oncology  Type 2 diabetes mellitus, controlled [name] is reminded of the importance of commitment to daily physical activity for 30 minutes or more, as able and the need to limit carbohydrate intake to 30 to 60 grams per meal to help with blood sugar control.   [name] is reminded of the importance of daily foot exam, annual eye examination, and good blood sugar, blood pressure and cholesterol control. Updated lab needed at/ before next visit.   Diabetic Labs Latest Ref Rng & Units 02/06/2019  02/05/2019 09/20/2018 08/07/2018 05/02/2018  HbA1c <5.7 % of total Hgb - - 7.0(H) - 7.1(H)  Microalbumin Not Estab. ug/mL - 122.6(H) - - -  Micro/Creat Ratio 0 - 29 mg/g creat - 65(H) - - -  Chol <200 mg/dL - - 156 - 160  HDL > OR = 50 mg/dL - - 57 - 73  Calc LDL mg/dL (calc) - - 81 - 74  Triglycerides <150 mg/dL - - 96 - 58  Creatinine 0.44 - 1.00 mg/dL 1.47(H) - 1.34(H) 1.45(H) 1.51(H)   BP/Weight 02/05/2019 09/27/2018 08/14/2018 08/10/2018 07/06/2018 06/12/2018 24/06/1462  Systolic BP 314 276 701 - 100 349 611  Diastolic BP 68 74 72 - 80 70 64  Wt. (Lbs) 197 199 202 201 195 201 199.04  BMI 32.78 33.12 33.61 33.45 32.45 33.45 33.12   Foot/eye exam completion dates Latest Ref Rng & Units 11/04/2018 06/12/2018  Eye Exam No Retinopathy No Retinopathy -  Foot Form Completion - - Done

## 2019-02-06 ENCOUNTER — Inpatient Hospital Stay (HOSPITAL_COMMUNITY): Payer: Medicare Other | Attending: Hematology

## 2019-02-06 ENCOUNTER — Ambulatory Visit (HOSPITAL_COMMUNITY)
Admission: RE | Admit: 2019-02-06 | Discharge: 2019-02-06 | Disposition: A | Discharge status: Home / Self Care | Payer: Medicare Other | Source: Ambulatory Visit | Attending: Nurse Practitioner | Admitting: Nurse Practitioner

## 2019-02-06 DIAGNOSIS — E039 Hypothyroidism, unspecified: Secondary | ICD-10-CM | POA: Insufficient documentation

## 2019-02-06 DIAGNOSIS — N189 Chronic kidney disease, unspecified: Secondary | ICD-10-CM | POA: Insufficient documentation

## 2019-02-06 DIAGNOSIS — I129 Hypertensive chronic kidney disease with stage 1 through stage 4 chronic kidney disease, or unspecified chronic kidney disease: Secondary | ICD-10-CM | POA: Insufficient documentation

## 2019-02-06 DIAGNOSIS — E1122 Type 2 diabetes mellitus with diabetic chronic kidney disease: Secondary | ICD-10-CM | POA: Diagnosis not present

## 2019-02-06 DIAGNOSIS — N183 Chronic kidney disease, stage 3 unspecified: Secondary | ICD-10-CM

## 2019-02-06 DIAGNOSIS — K219 Gastro-esophageal reflux disease without esophagitis: Secondary | ICD-10-CM | POA: Insufficient documentation

## 2019-02-06 DIAGNOSIS — D631 Anemia in chronic kidney disease: Secondary | ICD-10-CM | POA: Diagnosis not present

## 2019-02-06 DIAGNOSIS — R200 Anesthesia of skin: Secondary | ICD-10-CM | POA: Insufficient documentation

## 2019-02-06 DIAGNOSIS — E785 Hyperlipidemia, unspecified: Secondary | ICD-10-CM | POA: Diagnosis not present

## 2019-02-06 DIAGNOSIS — Z79899 Other long term (current) drug therapy: Secondary | ICD-10-CM | POA: Diagnosis not present

## 2019-02-06 DIAGNOSIS — E119 Type 2 diabetes mellitus without complications: Secondary | ICD-10-CM | POA: Diagnosis not present

## 2019-02-06 DIAGNOSIS — D472 Monoclonal gammopathy: Secondary | ICD-10-CM | POA: Diagnosis not present

## 2019-02-06 LAB — COMPREHENSIVE METABOLIC PANEL
ALT: 20 U/L (ref 0–44)
AST: 22 U/L (ref 15–41)
Albumin: 4 g/dL (ref 3.5–5.0)
Alkaline Phosphatase: 53 U/L (ref 38–126)
Anion gap: 8 (ref 5–15)
BUN: 27 mg/dL — ABNORMAL HIGH (ref 8–23)
CO2: 20 mmol/L — ABNORMAL LOW (ref 22–32)
Calcium: 9.4 mg/dL (ref 8.9–10.3)
Chloride: 109 mmol/L (ref 98–111)
Creatinine, Ser: 1.47 mg/dL — ABNORMAL HIGH (ref 0.44–1.00)
GFR calc Af Amer: 39 mL/min — ABNORMAL LOW (ref >60)
GFR calc non Af Amer: 34 mL/min — ABNORMAL LOW (ref >60)
Glucose, Bld: 96 mg/dL (ref 70–99)
Potassium: 3.9 mmol/L (ref 3.5–5.1)
Sodium: 137 mmol/L (ref 135–145)
Total Bilirubin: 0.5 mg/dL (ref 0.3–1.2)
Total Protein: 9.2 g/dL — ABNORMAL HIGH (ref 6.5–8.1)

## 2019-02-06 LAB — IRON AND TIBC
Iron: 83 ug/dL (ref 28–170)
Saturation Ratios: 26 % (ref 10.4–31.8)
TIBC: 325 ug/dL (ref 250–450)
UIBC: 242 ug/dL

## 2019-02-06 LAB — CBC WITH DIFFERENTIAL/PLATELET
Abs Immature Granulocytes: 0.01 10*3/uL (ref 0.00–0.07)
Basophils Absolute: 0 10*3/uL (ref 0.0–0.1)
Basophils Relative: 0 %
Eosinophils Absolute: 0.3 10*3/uL (ref 0.0–0.5)
Eosinophils Relative: 5 %
HCT: 34.9 % — ABNORMAL LOW (ref 36.0–46.0)
Hemoglobin: 10.8 g/dL — ABNORMAL LOW (ref 12.0–15.0)
Immature Granulocytes: 0 %
Lymphocytes Relative: 44 %
Lymphs Abs: 2.1 10*3/uL (ref 0.7–4.0)
MCH: 26.5 pg (ref 26.0–34.0)
MCHC: 30.9 g/dL (ref 30.0–36.0)
MCV: 85.5 fL (ref 80.0–100.0)
Monocytes Absolute: 0.4 10*3/uL (ref 0.1–1.0)
Monocytes Relative: 8 %
Neutro Abs: 2.1 10*3/uL (ref 1.7–7.7)
Neutrophils Relative %: 43 %
Platelets: 215 10*3/uL (ref 150–400)
RBC: 4.08 MIL/uL (ref 3.87–5.11)
RDW: 13.4 % (ref 11.5–15.5)
WBC: 4.8 10*3/uL (ref 4.0–10.5)
nRBC: 0 % (ref 0.0–0.2)

## 2019-02-06 LAB — LACTATE DEHYDROGENASE: LDH: 135 U/L (ref 98–192)

## 2019-02-06 LAB — MICROALBUMIN / CREATININE URINE RATIO
Creatinine, Urine: 188.2 mg/dL
Microalb Creat Ratio: 65 mg/g creat — ABNORMAL HIGH (ref 0–29)
Microalb, Ur: 122.6 ug/mL — ABNORMAL HIGH

## 2019-02-06 LAB — FERRITIN: Ferritin: 106 ng/mL (ref 11–307)

## 2019-02-06 LAB — VITAMIN B12: Vitamin B-12: 354 pg/mL (ref 180–914)

## 2019-02-06 LAB — FOLATE: Folate: 52 ng/mL (ref >5.9)

## 2019-02-07 ENCOUNTER — Encounter: Payer: Self-pay | Admitting: Family Medicine

## 2019-02-07 LAB — PROTEIN ELECTROPHORESIS, SERUM
A/G Ratio: 0.9 (ref 0.7–1.7)
Albumin ELP: 4 g/dL (ref 2.9–4.4)
Alpha-1-Globulin: 0.2 g/dL (ref 0.0–0.4)
Alpha-2-Globulin: 1 g/dL (ref 0.4–1.0)
Beta Globulin: 0.9 g/dL (ref 0.7–1.3)
Gamma Globulin: 2.3 g/dL — ABNORMAL HIGH (ref 0.4–1.8)
Globulin, Total: 4.3 g/dL — ABNORMAL HIGH (ref 2.2–3.9)
M-Spike, %: 2 g/dL — ABNORMAL HIGH
Total Protein ELP: 8.3 g/dL (ref 6.0–8.5)

## 2019-02-07 LAB — KAPPA/LAMBDA LIGHT CHAINS
Kappa free light chain: 199.6 mg/L — ABNORMAL HIGH (ref 3.3–19.4)
Kappa, lambda light chain ratio: 13.86 — ABNORMAL HIGH (ref 0.26–1.65)
Lambda free light chains: 14.4 mg/L (ref 5.7–26.3)

## 2019-02-07 LAB — VITAMIN D 25 HYDROXY (VIT D DEFICIENCY, FRACTURES): Vit D, 25-Hydroxy: 35.3 ng/mL (ref 30.0–100.0)

## 2019-02-08 ENCOUNTER — Other Ambulatory Visit: Payer: Self-pay

## 2019-02-08 MED ORDER — LEVOTHYROXINE SODIUM 50 MCG PO TABS: ORAL_TABLET | ORAL | 1 refills | Status: DC

## 2019-02-08 MED ORDER — BENAZEPRIL HCL 20 MG PO TABS: 20.0000 mg | ORAL_TABLET | Freq: Every day | ORAL | 1 refills | Status: DC

## 2019-02-08 MED ORDER — HYDROCHLOROTHIAZIDE 25 MG PO TABS: 25.0000 mg | ORAL_TABLET | Freq: Every day | ORAL | 1 refills | Status: DC

## 2019-02-08 MED ORDER — AMLODIPINE BESYLATE 10 MG PO TABS: 10.0000 mg | ORAL_TABLET | Freq: Every day | ORAL | 1 refills | Status: DC

## 2019-02-08 MED ORDER — PRAVASTATIN SODIUM 20 MG PO TABS: 20.0000 mg | ORAL_TABLET | Freq: Every evening | ORAL | 1 refills | Status: DC

## 2019-02-10 ENCOUNTER — Encounter: Payer: Self-pay | Admitting: Family Medicine

## 2019-02-10 NOTE — Assessment & Plan Note (Signed)
  Patient re-educated about  the importance of commitment to a  minimum of 150 minutes of exercise per week as able.  The importance of healthy food choices with portion control discussed, as well as eating regularly and within a 12 hour window most days. The need to choose "clean , green" food 50 to 75% of the time is discussed, as well as to make water the primary drink and set a goal of 64 ounces water daily.    Weight /BMI 02/05/2019 09/27/2018 08/14/2018  WEIGHT 197 lb 199 lb 202 lb  HEIGHT 5\' 5"  5\' 5"  -  BMI 32.78 kg/m2 33.12 kg/m2 33.61 kg/m2

## 2019-02-10 NOTE — Assessment & Plan Note (Signed)
Controlled, no change in medication DASH diet and commitment to daily physical activity for a minimum of 30 minutes discussed and encouraged, as a part of hypertension management. The importance of attaining a healthy weight is also discussed.  BP/Weight 02/05/2019 09/27/2018 08/14/2018 08/10/2018 07/06/2018 06/12/2018 19/02/4128  Systolic BP 047 533 917 - 921 783 754  Diastolic BP 68 74 72 - 80 70 64  Wt. (Lbs) 197 199 202 201 195 201 199.04  BMI 32.78 33.12 33.61 33.45 32.45 33.45 33.12

## 2019-02-10 NOTE — Assessment & Plan Note (Signed)
Renee Collins is reminded of the importance of commitment to daily physical activity for 30 minutes or more, as able and the need to limit carbohydrate intake to 30 to 60 grams per meal to help with blood sugar control.   Renee Collins is reminded of the importance of daily foot exam, annual eye examination, and good blood sugar, blood pressure and cholesterol control. Updated lab needed at/ before next visit.   Diabetic Labs Latest Ref Rng & Units 02/06/2019 02/05/2019 09/20/2018 08/07/2018 05/02/2018  HbA1c <5.7 % of total Hgb - - 7.0(H) - 7.1(H)  Microalbumin Not Estab. ug/mL - 122.6(H) - - -  Micro/Creat Ratio 0 - 29 mg/g creat - 65(H) - - -  Chol <200 mg/dL - - 156 - 160  HDL > OR = 50 mg/dL - - 57 - 73  Calc LDL mg/dL (calc) - - 81 - 74  Triglycerides <150 mg/dL - - 96 - 58  Creatinine 0.44 - 1.00 mg/dL 1.47(H) - 1.34(H) 1.45(H) 1.51(H)   BP/Weight 02/05/2019 09/27/2018 08/14/2018 08/10/2018 07/06/2018 06/12/2018 02/27/3299  Systolic BP 762 263 335 - 456 256 389  Diastolic BP 68 74 72 - 80 70 64  Wt. (Lbs) 197 199 202 201 195 201 199.04  BMI 32.78 33.12 33.61 33.45 32.45 33.45 33.12   Foot/eye exam completion dates Latest Ref Rng & Units 11/04/2018 06/12/2018  Eye Exam No Retinopathy No Retinopathy -  Foot Form Completion - - Done

## 2019-02-10 NOTE — Assessment & Plan Note (Signed)
Controlled, no change in medication  

## 2019-02-10 NOTE — Assessment & Plan Note (Signed)
Followed by Oncology

## 2019-02-10 NOTE — Assessment & Plan Note (Signed)
Hyperlipidemia:Low fat diet discussed and encouraged.   Lipid Panel  Lab Results  Component Value Date   CHOL 156 09/20/2018   HDL 57 09/20/2018   LDLCALC 81 09/20/2018   TRIG 96 09/20/2018   CHOLHDL 2.7 09/20/2018  Controlled, no change in medication Updated lab needed at/ before next visit.

## 2019-02-13 ENCOUNTER — Inpatient Hospital Stay (HOSPITAL_BASED_OUTPATIENT_CLINIC_OR_DEPARTMENT_OTHER): Payer: Medicare Other | Admitting: Hematology

## 2019-02-13 ENCOUNTER — Encounter (HOSPITAL_COMMUNITY): Payer: Self-pay | Admitting: Hematology

## 2019-02-13 ENCOUNTER — Other Ambulatory Visit: Payer: Self-pay

## 2019-02-13 VITALS — BP 132/65 | HR 90 | Temp 97.5°F | Resp 16 | Wt 196.7 lb

## 2019-02-13 DIAGNOSIS — E1122 Type 2 diabetes mellitus with diabetic chronic kidney disease: Secondary | ICD-10-CM | POA: Diagnosis not present

## 2019-02-13 DIAGNOSIS — I129 Hypertensive chronic kidney disease with stage 1 through stage 4 chronic kidney disease, or unspecified chronic kidney disease: Secondary | ICD-10-CM | POA: Diagnosis not present

## 2019-02-13 DIAGNOSIS — D631 Anemia in chronic kidney disease: Secondary | ICD-10-CM | POA: Diagnosis not present

## 2019-02-13 DIAGNOSIS — D472 Monoclonal gammopathy: Secondary | ICD-10-CM | POA: Diagnosis not present

## 2019-02-13 DIAGNOSIS — E785 Hyperlipidemia, unspecified: Secondary | ICD-10-CM | POA: Diagnosis not present

## 2019-02-13 DIAGNOSIS — Z79899 Other long term (current) drug therapy: Secondary | ICD-10-CM | POA: Diagnosis not present

## 2019-02-13 DIAGNOSIS — N189 Chronic kidney disease, unspecified: Secondary | ICD-10-CM | POA: Diagnosis not present

## 2019-02-13 DIAGNOSIS — E039 Hypothyroidism, unspecified: Secondary | ICD-10-CM | POA: Diagnosis not present

## 2019-02-13 DIAGNOSIS — K219 Gastro-esophageal reflux disease without esophagitis: Secondary | ICD-10-CM | POA: Diagnosis not present

## 2019-02-13 DIAGNOSIS — E119 Type 2 diabetes mellitus without complications: Secondary | ICD-10-CM | POA: Diagnosis not present

## 2019-02-13 DIAGNOSIS — R200 Anesthesia of skin: Secondary | ICD-10-CM | POA: Diagnosis not present

## 2019-02-13 NOTE — Progress Notes (Signed)
Renee Collins, Renee Collins [id number]   CLINIC:  Medical Oncology/Hematology  PCP:  [name], MD [address] [place] [address] [phone]   REASON FOR VISIT:  Follow-up for MGUS and normocytic anemia.  CURRENT THERAPY: Active surveillance.    INTERVAL HISTORY:  Renee Collins 78 y.o. female seen in follow-up visit for monoclonal gammopathy of undetermined significance and normocytic anemia and chronic kidney disease.  Denies any fevers, night sweats or weight loss in the last 6 months.  Denies any recurrent infections or hospitalizations.  Denies any bleeding per rectum or melena.  No change in bowel habits.  Denies any nausea, vomiting, diarrhea or constipation.  No bone pains reported.  Appetite and energy levels are 75%.  Numbness in the left hand thumb is stable.    REVIEW OF SYSTEMS:  Review of Systems  Neurological: Positive for numbness.  All other systems reviewed and are negative.    PAST MEDICAL/SURGICAL HISTORY:  Past Medical History:  Diagnosis Date  . Anemia   . Chronic back pain   . Depression   . Diabetes mellitus   . GERD (gastroesophageal reflux disease)   . Hyperlipidemia   . Hypertension   . Hypothyroidism    Past Surgical History:  Procedure Laterality Date  . BONE MARROW ASPIRATION Left 05/16/15  . BONE MARROW BIOPSY Left 05/16/15  . COLONOSCOPY  03/30/2012   Procedure: COLONOSCOPY;  Surgeon: Rogene Houston, MD;  Location: [facility];  Service: Endoscopy;  Laterality: N/A;  730  . COLONOSCOPY N/A 06/30/2017   Procedure: COLONOSCOPY;  Surgeon: Rogene Houston, MD;  Location: [facility];  Service: Endoscopy;  Laterality: N/A;  2:00  . LAMINECTOMY  1980's  . POLYPECTOMY  06/30/2017   Procedure: POLYPECTOMY;  Surgeon: Rogene Houston, MD;  Location: [facility];  Service: Endoscopy;;  colon  . SPINE SURGERY  approx 1983   dr Joya Salm  . TOTAL ABDOMINAL HYSTERECTOMY  approx 1993   fibroids      SOCIAL HISTORY:  Social History   Socioeconomic History  . Marital status: Divorced    Spouse name: Not on file  . Number of children: 3  . Years of education: Not on file  . Highest education level: Not on file  Occupational History  . Occupation: LPN  Social Needs  . Financial resource strain: Not hard at all  . Food insecurity    Worry: Never true    Inability: Never true  . Transportation needs    Medical: No    Non-medical: No  Tobacco Use  . Smoking status: Never Smoker  . Smokeless tobacco: Never Used  Substance and Sexual Activity  . Alcohol use: No  . Drug use: No  . Sexual activity: Not Currently  Lifestyle  . Physical activity    Days per week: 5 days    Minutes per session: 40 min  . Stress: Not at all  Relationships  . Social connections    Talks on phone: More than three times a week    Gets together: More than three times a week    Attends religious service: More than 4 times per year    Active member of club or organization: No    Attends meetings of clubs or organizations: Never    Relationship status: Divorced  . Intimate partner violence    Fear of current or ex partner: No    Emotionally abused: No  Physically abused: No    Forced sexual activity: No  Other Topics Concern  . Not on file  Social History Narrative   Pt has 2 living adult children, 1 desease at age 66 secondary congential heart disease     FAMILY HISTORY:  Family History  Problem Relation Age of Onset  . Heart attack Mother 16  . Heart disease Mother   . Cancer Mother        type unknown  . Diabetes Mother   . Kidney failure Father   . Kidney disease Father   . Myasthenia gravis Brother   . Diabetes Sister   . Breast cancer Sister 6  . Heart disease Brother   . Diabetes Sister   . Sickle cell anemia Sister   . Cancer Sister        unknown type    CURRENT MEDICATIONS:  Outpatient Encounter Medications as of 02/13/2019  Medication Sig  .  acetaminophen (TYLENOL 8 HOUR ARTHRITIS PAIN) 650 MG CR tablet Take 650 mg by mouth every 8 (eight) hours as needed for pain.  Marland Kitchen amLODipine (NORVASC) 10 MG tablet Take 1 tablet (10 mg total) by mouth daily.  Marland Kitchen aspirin (ASPIRIN ADULT LOW STRENGTH) 81 MG EC tablet Take 1 tablet (81 mg total) by mouth daily.  . benazepril (LOTENSIN) 20 MG tablet Take 1 tablet (20 mg total) by mouth daily.  . Calcium-Magnesium-Vitamin D (CALCIUM 1200+D3 PO) Take 1 tablet by mouth 2 (two) times daily.  . diphenhydrAMINE (BENADRYL) 25 mg capsule Take 25 mg by mouth at bedtime as needed (for allergies/sleep.).   Marland Kitchen hydrochlorothiazide (HYDRODIURIL) 25 MG tablet Take 1 tablet (25 mg total) by mouth daily.  Marland Kitchen levothyroxine (SYNTHROID) 50 MCG tablet TAKE 1 AND 1/2 TABLETS ONE TIME DAILY 30 MINUTES TO 1 HOUR BEFORE BREAKFAST ON AN EMPTY STOMACH  . Multiple Vitamin (MULTIVITAMIN WITH MINERALS) TABS tablet Take 1 tablet by mouth daily.  Marland Kitchen omeprazole (PRILOSEC) 20 MG capsule Take 20 mg by mouth daily as needed (for acid reflux.).   Marland Kitchen polyethylene glycol powder (GLYCOLAX/MIRALAX) powder Take 17 g by mouth daily as needed (FOR CONSTIPATION.).  Marland Kitchen pravastatin (PRAVACHOL) 20 MG tablet Take 1 tablet (20 mg total) by mouth every evening.  . capsaicin (ZOSTRIX) 0.025 % cream Apply 1 application topically as directed.  . gabapentin (NEURONTIN) 100 MG capsule Take 100-200 mg by mouth 3 (three) times daily.    No facility-administered encounter medications on file as of 02/13/2019.     ALLERGIES:  No Known Allergies   PHYSICAL EXAM:  ECOG Performance status: 1  Vitals:   02/13/19 1120  BP: 132/65  Pulse: 90  Resp: 16  Temp: (!) 97.5 F (36.4 C)  SpO2: 97%   Filed Weights   02/13/19 1120  Weight: 196 lb 11.2 oz (89.2 kg)    Physical Exam Vitals signs reviewed.  Constitutional:      Appearance: Normal appearance.  Cardiovascular:     Rate and Rhythm: Normal rate and regular rhythm.     Heart sounds: Normal heart  sounds.  Pulmonary:     Effort: Pulmonary effort is normal.     Breath sounds: Normal breath sounds.  Abdominal:     General: There is no distension.     Palpations: Abdomen is soft. There is no mass.  Musculoskeletal:        General: No swelling.  Skin:    General: Skin is warm.  Neurological:     General: No focal deficit present.  Mental Status: She is alert and oriented to person, place, and time.  Psychiatric:        Mood and Affect: Mood normal.        Behavior: Behavior normal.      LABORATORY DATA:  I have reviewed the labs as listed.  CBC    Component Value Date/Time   WBC 4.8 02/06/2019 1247   RBC 4.08 02/06/2019 1247   HGB 10.8 (L) 02/06/2019 1247   HCT 34.9 (L) 02/06/2019 1247   PLT 215 02/06/2019 1247   MCV 85.5 02/06/2019 1247   MCH 26.5 02/06/2019 1247   MCHC 30.9 02/06/2019 1247   RDW 13.4 02/06/2019 1247   LYMPHSABS 2.1 02/06/2019 1247   MONOABS 0.4 02/06/2019 1247   EOSABS 0.3 02/06/2019 1247   BASOSABS 0.0 02/06/2019 1247   CMP Latest Ref Rng & Units 02/06/2019 09/20/2018 08/07/2018  Glucose 70 - 99 mg/dL 96 118(H) 137(H)  BUN 8 - 23 mg/dL 27(H) 21 20  Creatinine 0.44 - 1.00 mg/dL 1.47(H) 1.34(H) 1.45(H)  Sodium 135 - 145 mmol/L 137 138 139  Potassium 3.5 - 5.1 mmol/L 3.9 3.9 3.4(L)  Chloride 98 - 111 mmol/L 109 105 108  CO2 22 - 32 mmol/L 20(L) 24 22  Calcium 8.9 - 10.3 mg/dL 9.4 9.7 9.1  Total Protein 6.5 - 8.1 g/dL 9.2(H) 8.2(H) 8.3(H)  Total Bilirubin 0.3 - 1.2 mg/dL 0.5 0.4 0.3  Alkaline Phos 38 - 126 U/L 53 - 55  AST 15 - 41 U/L '22 25 22  '$ ALT 0 - 44 U/L '20 23 13       '$ DIAGNOSTIC IMAGING:  I have independently reviewed the scans and discussed with the patient.      ASSESSMENT & PLAN:   MGUS (monoclonal gammopathy of unknown significance) 1.  IgG kappa MGUS: - Bone marrow biopsy on 05/16/2015 shows limited bone marrow sample with no increase in plasma cells in the very limited material present.  Cytogenetics-46, XX.  FISH  panel was normal. - Bone survey on 02/22/2017 showed ill-defined calcified lesion in the distal right femur.  MRI of the right femur on 03/02/2017 showed no osseous lesion. -She denies any fevers, night sweats or weight loss.  Denies any recurrent infections. -We reviewed blood work from 02/06/2019.  M spike is 2 g/dL and stable.  Free light chain ratio is 13.86 and stable.  Creatinine is 1.47.  Hemoglobin is 10.8.  Calcium was normal at 9.4. -Skeletal survey on 02/06/2019 did not show any lytic lesions. - We reviewed normal prognosis of MGUS.  She will come back in 6 months with repeat labs.  2.  Normocytic anemia: -This is from chronic kidney disease.  Hemoglobin is 10.8. -Ferritin was 106 and percent saturation is 26.  J88 and folic acid were normal. -Denies any bleeding per rectum or melena.  We will plan to repeat her labs along with ferritin and iron panel prior to next visit.  3.  CKD: - Creatinine has been stable between 1.4 and 1.5.      Total time spent is 25 minutes with more than 50% of the time spent face-to-face discussing prognosis, surveillance plan, counseling and coordination of care.    Orders placed this encounter:  Orders Placed This Encounter  Procedures  . CBC with Differential/Platelet  . Comprehensive metabolic panel  . Protein electrophoresis, serum  . Kappa/lambda light chains  . Lactate dehydrogenase  . Iron and TIBC  . Ferritin      Derek Jack, MD Deneise Lever  [place] [phone]

## 2019-02-13 NOTE — Assessment & Plan Note (Signed)
1.  IgG kappa MGUS: - Bone marrow biopsy on 05/16/2015 shows limited bone marrow sample with no increase in plasma cells in the very limited material present.  Cytogenetics-55, XX.  FISH panel was normal. - Bone survey on 02/22/2017 showed ill-defined calcified lesion in the distal right femur.  MRI of the right femur on 03/02/2017 showed no osseous lesion. -She denies any fevers, night sweats or weight loss.  Denies any recurrent infections. -We reviewed blood work from 02/06/2019.  M spike is 2 g/dL and stable.  Free light chain ratio is 13.86 and stable.  Creatinine is 1.47.  Hemoglobin is 10.8.  Calcium was normal at 9.4. -Skeletal survey on 02/06/2019 did not show any lytic lesions. - We reviewed normal prognosis of MGUS.  She will come back in 6 months with repeat labs.  2.  Normocytic anemia: -This is from chronic kidney disease.  Hemoglobin is 10.8. -Ferritin was 106 and percent saturation is 26.  P79 and folic acid were normal. -Denies any bleeding per rectum or melena.  We will plan to repeat her labs along with ferritin and iron panel prior to next visit.  3.  CKD: - Creatinine has been stable between 1.4 and 1.5.

## 2019-02-13 NOTE — Patient Instructions (Signed)
[facility] Discharge Instructions  You were seen today by Dr. Katragadda. He went over your recent lab results. He will see you back in 6 months for labs and follow up.   Thank you for choosing [facility] at [facility] to provide your oncology and hematology care.  To afford each patient quality time with our provider, please arrive at least 15 minutes before your scheduled appointment time.   If you have a lab appointment with the Cancer Center please come in thru the  Main Entrance and check in at the main information desk  You need to re-schedule your appointment should you arrive 10 or more minutes late.  We strive to give you quality time with our providers, and arriving late affects you and other patients whose appointments are after yours.  Also, if you no show three or more times for appointments you may be dismissed from the clinic at the providers discretion.     Again, thank you for choosing [facility].  Our hope is that these requests will decrease the amount of time that you wait before being seen by our physicians.       _____________________________________________________________  Should you have questions after your visit to [facility], please contact our office at [phone] between the hours of 8:00 a.m. and 4:30 p.m.  Voicemails left after 4:00 p.m. will not be returned until the following business day.  For prescription refill requests, have your pharmacy contact our office and allow 72 hours.    Cancer Center Support Programs:   > Cancer Support Group  2nd Tuesday of the month 1pm-2pm, Journey Room    

## 2019-02-19 ENCOUNTER — Ambulatory Visit (INDEPENDENT_AMBULATORY_CARE_PROVIDER_SITE_OTHER): Payer: Medicare Other

## 2019-02-19 ENCOUNTER — Telehealth: Payer: Self-pay | Admitting: *Deleted

## 2019-02-19 ENCOUNTER — Other Ambulatory Visit: Payer: Self-pay

## 2019-02-19 DIAGNOSIS — Z23 Encounter for immunization: Secondary | ICD-10-CM

## 2019-02-19 NOTE — Telephone Encounter (Signed)
Pt came into the office wanting to know about the blood work that was added to her lab orders for the cancer center. She had the drawn but she did not see the A1C on it and wasn't sure if it was ordered.

## 2019-02-19 NOTE — Telephone Encounter (Signed)
[facility] s labs drawn. Did not know she had to tell them that she wanted labs for both drs

## 2019-02-19 NOTE — Telephone Encounter (Signed)
Left vm to call back   (the labs were ordered but they would not have automatically seen them unless she told them she wanted to have dr simpsons labs drawn also)

## 2019-02-20 ENCOUNTER — Other Ambulatory Visit (HOSPITAL_COMMUNITY)
Admission: RE | Admit: 2019-02-20 | Discharge: 2019-02-20 | Disposition: A | Discharge status: Home / Self Care | Payer: Medicare Other | Source: Ambulatory Visit | Attending: Family Medicine | Admitting: Family Medicine

## 2019-02-20 ENCOUNTER — Other Ambulatory Visit: Payer: Self-pay

## 2019-02-20 DIAGNOSIS — I10 Essential (primary) hypertension: Secondary | ICD-10-CM | POA: Insufficient documentation

## 2019-02-20 DIAGNOSIS — E118 Type 2 diabetes mellitus with unspecified complications: Secondary | ICD-10-CM | POA: Diagnosis not present

## 2019-02-20 DIAGNOSIS — E559 Vitamin D deficiency, unspecified: Secondary | ICD-10-CM | POA: Diagnosis not present

## 2019-02-20 DIAGNOSIS — E785 Hyperlipidemia, unspecified: Secondary | ICD-10-CM | POA: Diagnosis not present

## 2019-02-20 LAB — TSH: TSH: 7.232 u[IU]/mL — ABNORMAL HIGH (ref 0.350–4.500)

## 2019-02-20 LAB — HEMOGLOBIN A1C
Hgb A1c MFr Bld: 6.5 % — ABNORMAL HIGH (ref 4.8–5.6)
Mean Plasma Glucose: 139.85 mg/dL

## 2019-02-21 LAB — MICROALBUMIN / CREATININE URINE RATIO
Creatinine, Urine: 197.8 mg/dL
Microalb Creat Ratio: 48 mg/g creat — ABNORMAL HIGH (ref 0–29)
Microalb, Ur: 95.2 ug/mL — ABNORMAL HIGH

## 2019-02-21 LAB — VITAMIN D 25 HYDROXY (VIT D DEFICIENCY, FRACTURES): Vit D, 25-Hydroxy: 36.6 ng/mL (ref 30.0–100.0)

## 2019-02-22 ENCOUNTER — Other Ambulatory Visit: Payer: Self-pay | Admitting: Family Medicine

## 2019-02-22 MED ORDER — LEVOTHYROXINE SODIUM 100 MCG PO TABS: 100.0000 ug | ORAL_TABLET | Freq: Every day | ORAL | 3 refills | Status: DC

## 2019-02-22 NOTE — Progress Notes (Signed)
Synthroid 100

## 2019-03-29 ENCOUNTER — Ambulatory Visit: Payer: Medicare HMO

## 2019-03-30 ENCOUNTER — Ambulatory Visit (INDEPENDENT_AMBULATORY_CARE_PROVIDER_SITE_OTHER): Payer: Medicare Other | Admitting: Family Medicine

## 2019-03-30 ENCOUNTER — Other Ambulatory Visit: Payer: Self-pay

## 2019-03-30 ENCOUNTER — Encounter: Payer: Self-pay | Admitting: Family Medicine

## 2019-03-30 VITALS — BP 132/65 | HR 90 | Resp 16 | Ht 66.0 in | Wt 196.0 lb

## 2019-03-30 DIAGNOSIS — Z0000 Encounter for general adult medical examination without abnormal findings: Secondary | ICD-10-CM | POA: Diagnosis not present

## 2019-03-30 NOTE — Patient Instructions (Signed)
Ms. Detwiler , Thank you for taking time to come for your Medicare Wellness Visit. I appreciate your ongoing commitment to your health goals. Please review the following plan we discussed and let me know if I can assist you in the future.   Please continue to practice social distancing to keep you, your family, and our community safe.  If you must go out, please wear a Mask and practice good handwashing.  Screening recommendations/referrals: Colonoscopy: Due 2024 Mammogram: Up to date Bone Density: Up to date Recommended yearly ophthalmology/optometry visit for glaucoma screening and checkup Recommended yearly dental visit for hygiene and checkup  Vaccinations: Influenza vaccine: Completed Pneumococcal vaccine: Completed Tdap vaccine: Completed Shingles vaccine: Need last one  Advanced directives: you reported you have an advanced care directive, this is great. Thank you for taking time to address these needs for yourself and family.  Conditions/risks identified: Falls  Next appointment: 06/12/2019    Preventive Care 80 Years and Older, Female Preventive care refers to lifestyle choices and visits with your health care provider that can promote health and wellness. What does preventive care include?  A yearly physical exam. This is also called an annual well check.  Dental exams once or twice a year.  Routine eye exams. Ask your health care provider how often you should have your eyes checked.  Personal lifestyle choices, including:  Daily care of your teeth and gums.  Regular physical activity.  Eating a healthy diet.  Avoiding tobacco and drug use.  Limiting alcohol use.  Practicing safe sex.  Taking low-dose aspirin every day.  Taking vitamin and mineral supplements as recommended by your health care provider. What happens during an annual well check? The services and screenings done by your health care provider during your annual well check will depend on your  age, overall health, lifestyle risk factors, and family history of disease. Counseling  Your health care provider may ask you questions about your:  Alcohol use.  Tobacco use.  Drug use.  Emotional well-being.  Home and relationship well-being.  Sexual activity.  Eating habits.  History of falls.  Memory and ability to understand (cognition).  Work and work Statistician.  Reproductive health. Screening  You may have the following tests or measurements:  Height, weight, and BMI.  Blood pressure.  Lipid and cholesterol levels. These may be checked every 5 years, or more frequently if you are over 43 years old.  Skin check.  Lung cancer screening. You may have this screening every year starting at age 33 if you have a 30-pack-year history of smoking and currently smoke or have quit within the past 15 years.  Fecal occult blood test (FOBT) of the stool. You may have this test every year starting at age 69.  Flexible sigmoidoscopy or colonoscopy. You may have a sigmoidoscopy every 5 years or a colonoscopy every 10 years starting at age 42.  Hepatitis C blood test.  Hepatitis B blood test.  Sexually transmitted disease (STD) testing.  Diabetes screening. This is done by checking your blood sugar (glucose) after you have not eaten for a while (fasting). You may have this done every 1-3 years.  Bone density scan. This is done to screen for osteoporosis. You may have this done starting at age 20.  Mammogram. This may be done every 1-2 years. Talk to your health care provider about how often you should have regular mammograms. Talk with your health care provider about your test results, treatment options, and if necessary, the  need for more tests. Vaccines  Your health care provider may recommend certain vaccines, such as:  Influenza vaccine. This is recommended every year.  Tetanus, diphtheria, and acellular pertussis (Tdap, Td) vaccine. You may need a Td booster  every 10 years.  Zoster vaccine. You may need this after age 2.  Pneumococcal 13-valent conjugate (PCV13) vaccine. One dose is recommended after age 49.  Pneumococcal polysaccharide (PPSV23) vaccine. One dose is recommended after age 64. Talk to your health care provider about which screenings and vaccines you need and how often you need them. This information is not intended to replace advice given to you by your health care provider. Make sure you discuss any questions you have with your health care provider. Document Released: 07/11/2015 Document Revised: 03/03/2016 Document Reviewed: 04/15/2015 Elsevier Interactive Patient Education  2017 [place] Prevention in the Home Falls can cause injuries. They can happen to people of all ages. There are many things you can do to make your home safe and to help prevent falls. What can I do on the outside of my home?  Regularly fix the edges of walkways and driveways and fix any cracks.  Remove anything that might make you trip as you walk through a door, such as a raised step or threshold.  Trim any bushes or trees on the path to your home.  Use bright outdoor lighting.  Clear any walking paths of anything that might make someone trip, such as rocks or tools.  Regularly check to see if handrails are loose or broken. Make sure that both sides of any steps have handrails.  Any raised decks and porches should have guardrails on the edges.  Have any leaves, snow, or ice cleared regularly.  Use sand or salt on walking paths during winter.  Clean up any spills in your garage right away. This includes oil or grease spills. What can I do in the bathroom?  Use night lights.  Install grab bars by the toilet and in the tub and shower. Do not use towel bars as grab bars.  Use non-skid mats or decals in the tub or shower.  If you need to sit down in the shower, use a plastic, non-slip stool.  Keep the floor dry. Clean up any  water that spills on the floor as soon as it happens.  Remove soap buildup in the tub or shower regularly.  Attach bath mats securely with double-sided non-slip rug tape.  Do not have throw rugs and other things on the floor that can make you trip. What can I do in the bedroom?  Use night lights.  Make sure that you have a light by your bed that is easy to reach.  Do not use any sheets or blankets that are too big for your bed. They should not hang down onto the floor.  Have a firm chair that has side arms. You can use this for support while you get dressed.  Do not have throw rugs and other things on the floor that can make you trip. What can I do in the kitchen?  Clean up any spills right away.  Avoid walking on wet floors.  Keep items that you use a lot in easy-to-reach places.  If you need to reach something above you, use a strong step stool that has a grab bar.  Keep electrical cords out of the way.  Do not use floor polish or wax that makes floors slippery. If you must use wax,  use non-skid floor wax.  Do not have throw rugs and other things on the floor that can make you trip. What can I do with my stairs?  Do not leave any items on the stairs.  Make sure that there are handrails on both sides of the stairs and use them. Fix handrails that are broken or loose. Make sure that handrails are as long as the stairways.  Check any carpeting to make sure that it is firmly attached to the stairs. Fix any carpet that is loose or worn.  Avoid having throw rugs at the top or bottom of the stairs. If you do have throw rugs, attach them to the floor with carpet tape.  Make sure that you have a light switch at the top of the stairs and the bottom of the stairs. If you do not have them, ask someone to add them for you. What else can I do to help prevent falls?  Wear shoes that:  Do not have high heels.  Have rubber bottoms.  Are comfortable and fit you well.  Are closed  at the toe. Do not wear sandals.  If you use a stepladder:  Make sure that it is fully opened. Do not climb a closed stepladder.  Make sure that both sides of the stepladder are locked into place.  Ask someone to hold it for you, if possible.  Clearly mark and make sure that you can see:  Any grab bars or handrails.  First and last steps.  Where the edge of each step is.  Use tools that help you move around (mobility aids) if they are needed. These include:  Canes.  Walkers.  Scooters.  Crutches.  Turn on the lights when you go into a dark area. Replace any light bulbs as soon as they burn out.  Set up your furniture so you have a clear path. Avoid moving your furniture around.  If any of your floors are uneven, fix them.  If there are any pets around you, be aware of where they are.  Review your medicines with your doctor. Some medicines can make you feel dizzy. This can increase your chance of falling. Ask your doctor what other things that you can do to help prevent falls. This information is not intended to replace advice given to you by your health care provider. Make sure you discuss any questions you have with your health care provider. Document Released: 04/10/2009 Document Revised: 11/20/2015 Document Reviewed: 07/19/2014 Elsevier Interactive Patient Education  2017 [employer].

## 2019-03-30 NOTE — Progress Notes (Signed)
Subjective:   Renee Collins is a 78 y.o. female who presents for Medicare Annual (Subsequent) preventive examination.  Location of Patient: Home Location of Provider: Telehealth Consent was obtain for visit to be over via telehealth.  I verified that I am speaking with the correct person using two identifiers.   Review of Systems:    Cardiac Risk Factors include: advanced age (>17mn, >>75women);diabetes mellitus;dyslipidemia;hypertension;obesity (BMI >30kg/m2)     Objective:     Vitals: BP 132/65   Pulse 90   Resp 16   Ht '5\' 6"'$  (1.676 m)   Wt 196 lb (88.9 kg)   BMI 31.64 kg/m   Body mass index is 31.64 kg/m.  Advanced Directives 02/13/2019 08/14/2018 03/27/2018 02/09/2018 08/10/2017 06/30/2017 02/07/2017  Does Patient Have a Medical Advance Directive? Yes No No Yes Yes Yes No  Type of Advance Directive Living will;Healthcare Power of [place]-  Does patient want to make changes to medical advance directive? No - Patient declined - - No - Patient declined - - -  Copy of HIsmayin Chart? No - copy requested - - No - copy requested No - copy requested No - copy requested -  Would patient like information on creating a medical advance directive? - - Yes (ED - Information included in AVS) - No - Patient declined - No - Patient declined  Pre-existing out of facility DNR order (yellow form or pink MOST form) - - - - - - -    Tobacco Social History   Tobacco Use  Smoking Status Never Smoker  Smokeless Tobacco Never Used     Counseling given: Yes   Clinical Intake:  Pre-visit preparation completed: Yes  Pain : No/denies pain Pain Score: 0-No pain     BMI - recorded: 31.64 Nutritional Status: BMI > 30  Obese Nutritional Risks: None Diabetes: Yes CBG done?: No Did pt. bring in CBG monitor from home?: No  How often do you need to have someone  help you when you read instructions, pamphlets, or other written materials from your doctor or pharmacy?: 1 - Never What is the last grade level you completed in school?: GED  Interpreter Needed?: No     Past Medical History:  Diagnosis Date  . Anemia   . Chronic back pain   . Depression   . Diabetes mellitus   . GERD (gastroesophageal reflux disease)   . Hyperlipidemia   . Hypertension   . Hypothyroidism    Past Surgical History:  Procedure Laterality Date  . BONE MARROW ASPIRATION Left 05/16/15  . BONE MARROW BIOPSY Left 05/16/15  . COLONOSCOPY  03/30/2012   Procedure: COLONOSCOPY;  Surgeon: NRogene Houston MD;  [facility];  Service: Endoscopy;  Laterality: N/A;  730  . COLONOSCOPY N/A 06/30/2017   Procedure: COLONOSCOPY;  Surgeon: RRogene Houston MD;  [facility];  Service: Endoscopy;  Laterality: N/A;  2:00  . LAMINECTOMY  1980's  . POLYPECTOMY  06/30/2017   Procedure: POLYPECTOMY;  Surgeon: RRogene Houston MD;  [facility];  Service: Endoscopy;;  colon  . SPINE SURGERY  approx 1983   dr bJoya Salm . TOTAL ABDOMINAL HYSTERECTOMY  approx 1993   fibroids    Family History  Problem Relation Age of Onset  . Heart attack Mother 895 . Heart disease Mother   . Cancer Mother  type unknown  . Diabetes Mother   . Kidney failure Father   . Kidney disease Father   . Myasthenia gravis Brother   . Diabetes Sister   . Breast cancer Sister 64  . Heart disease Brother   . Diabetes Sister   . Sickle cell anemia Sister   . Cancer Sister        unknown type   Social History   Socioeconomic History  . Marital status: Divorced    Spouse name: Not on file  . Number of children: 3  . Years of education: Not on file  . Highest education level: Not on file  Occupational History  . Occupation: LPN  Social Needs  . Financial resource strain: Not hard at all  . Food insecurity    Worry: Never true    Inability: Never true  .  Transportation needs    Medical: No    Non-medical: No  Tobacco Use  . Smoking status: Never Smoker  . Smokeless tobacco: Never Used  Substance and Sexual Activity  . Alcohol use: No  . Drug use: No  . Sexual activity: Not Currently  Lifestyle  . Physical activity    Days per week: 5 days    Minutes per session: 40 min  . Stress: Not at all  Relationships  . Social connections    Talks on phone: More than three times a week    Gets together: More than three times a week    Attends religious service: More than 4 times per year    Active member of club or organization: No    Attends meetings of clubs or organizations: Never    Relationship status: Divorced  Other Topics Concern  . Not on file  Social History Narrative   Pt has 2 living adult children, 1 desease at age 79 secondary congential heart disease     Outpatient Encounter Medications as of 03/30/2019  Medication Sig  . acetaminophen (TYLENOL 8 HOUR ARTHRITIS PAIN) 650 MG CR tablet Take 650 mg by mouth every 8 (eight) hours as needed for pain.  Marland Kitchen amLODipine (NORVASC) 10 MG tablet Take 1 tablet (10 mg total) by mouth daily.  Marland Kitchen aspirin (ASPIRIN ADULT LOW STRENGTH) 81 MG EC tablet Take 1 tablet (81 mg total) by mouth daily.  . benazepril (LOTENSIN) 20 MG tablet Take 1 tablet (20 mg total) by mouth daily.  . Calcium-Magnesium-Vitamin D (CALCIUM 1200+D3 PO) Take 1 tablet by mouth 2 (two) times daily.  . capsaicin (ZOSTRIX) 0.025 % cream Apply 1 application topically as directed.  . diphenhydrAMINE (BENADRYL) 25 mg capsule Take 25 mg by mouth at bedtime as needed (for allergies/sleep.).   Marland Kitchen gabapentin (NEURONTIN) 100 MG capsule Take 100-200 mg by mouth 3 (three) times daily.   . hydrochlorothiazide (HYDRODIURIL) 25 MG tablet Take 1 tablet (25 mg total) by mouth daily.  Marland Kitchen levothyroxine (SYNTHROID) 100 MCG tablet Take 1 tablet (100 mcg total) by mouth daily.  . Multiple Vitamin (MULTIVITAMIN WITH MINERALS) TABS tablet Take 1  tablet by mouth daily.  Marland Kitchen omeprazole (PRILOSEC) 20 MG capsule Take 20 mg by mouth daily as needed (for acid reflux.).   Marland Kitchen polyethylene glycol powder (GLYCOLAX/MIRALAX) powder Take 17 g by mouth daily as needed (FOR CONSTIPATION.).  Marland Kitchen pravastatin (PRAVACHOL) 20 MG tablet Take 1 tablet (20 mg total) by mouth every evening.   No facility-administered encounter medications on file as of 03/30/2019.     Activities of Daily Living In your present state  of health, do you have any difficulty performing the following activities: 03/30/2019  Hearing? N  Vision? N  Difficulty concentrating or making decisions? N  Walking or climbing stairs? N  Dressing or bathing? N  Doing errands, shopping? N  Preparing Food and eating ? N  Using the Toilet? N  In the past six months, have you accidently leaked urine? N  Do you have problems with loss of bowel control? N  Managing your Medications? N  Managing your Finances? N  Housekeeping or managing your Housekeeping? N  Some recent data might be hidden    Patient Care Team: [name], MD as PCP - General [place], Kelby Fam, MD (Inactive) as Consulting Physician (Hematology and Oncology) Rutherford Guys, MD as Consulting Physician (Ophthalmology)    Assessment:   This is a routine wellness examination for Leslie.  Exercise Activities and Dietary recommendations Current Exercise Habits: The patient does not participate in regular exercise at present, Exercise limited by: None identified  Goals    . Have 3 meals a day     Recommend eating 3 balanced meals a day.       Fall Risk Fall Risk  03/30/2019 02/05/2019 09/27/2018 06/12/2018 03/27/2018  Falls in the past year? 0 0 0 0 No  Number falls in past yr: 0 - 0 - -  Injury with Fall? 0 0 0 - -  Risk for fall due to : - - Impaired vision - -  Risk for fall due to: Comment - - - - -  Follow up - - Falls evaluation completed;Falls prevention discussed - -   Is the patient's home free of loose  throw rugs in walkways, pet beds, electrical cords, etc?   yes      Grab bars in the bathroom? yes      Handrails on the stairs?   yes      Adequate lighting?   yes     Depression Screen PHQ 2/9 Scores 03/30/2019 02/05/2019 09/27/2018 09/27/2018  PHQ - 2 Score 0 0 0 0  PHQ- 9 Score - - - -     Cognitive Function     6CIT Screen 03/30/2019 03/27/2018 10/25/2016  What Year? 0 points 0 points 0 points  What month? 0 points 0 points 0 points  What time? 0 points 0 points 0 points  Count back from 20 0 points 0 points 0 points  Months in reverse 0 points 0 points 0 points  Repeat phrase 0 points 4 points 0 points  Total Score 0 4 0    Immunization History  Administered Date(s) Administered  . Fluad Quad(high Dose 65+) 02/19/2019  . Influenza Split 04/28/2011, 04/12/2012  . Influenza,inj,Quad PF,6+ Mos 04/09/2013, 05/02/2014, 03/11/2015, 05/28/2016, 03/22/2017, 03/27/2018  . Pneumococcal Conjugate-13 09/30/2014  . Pneumococcal Polysaccharide-23 04/28/2011  . Tdap 04/28/2011  . Zoster 08/26/2011  . Zoster Recombinat (Shingrix) 02/28/2019    Qualifies for Shingles Vaccine? completed  Screening Tests Health Maintenance  Topic Date Due  . FOOT EXAM  06/13/2019  . HEMOGLOBIN A1C  08/23/2019  . OPHTHALMOLOGY EXAM  11/04/2019  . TETANUS/TDAP  04/27/2021  . COLONOSCOPY  06/30/2022  . INFLUENZA VACCINE  Completed  . DEXA SCAN  Completed  . PNA vac Low Risk Adult  Completed    Cancer Screenings: Lung: Low Dose CT Chest recommended if Age 44-80 years, 30 pack-year currently smoking OR have quit w/in 15years. Patient does not qualify. Breast:  Up to date on Mammogram? Yes  Up to date of Bone Density/Dexa? Yes Colorectal:  Due 2024  Additional Screenings:  : Hepatitis C Screening: has not been checked     Plan:   1. Encounter for Medicare annual wellness exam   I have personally reviewed and noted the following in the patient's chart:   . Medical and social history . Use of  alcohol, tobacco or illicit drugs  . Current medications and supplements . Functional ability and status . Nutritional status . Physical activity . Advanced directives . List of other physicians . Hospitalizations, surgeries, and ER visits in previous 12 months . Vitals . Screenings to include cognitive, depression, and falls . Referrals and appointments  In addition, I have reviewed and discussed with patient certain preventive protocols, quality metrics, and best practice recommendations. A written personalized care plan for preventive services as well as general preventive health recommendations were provided to patient.     I provided 20 minutes of non-face-to-face time during this encounter.   Perlie Mayo, NP  03/30/2019

## 2019-04-09 ENCOUNTER — Other Ambulatory Visit: Payer: Self-pay

## 2019-04-09 ENCOUNTER — Ambulatory Visit: Payer: Medicare HMO

## 2019-04-09 MED ORDER — GABAPENTIN 100 MG PO CAPS: 100.0000 mg | ORAL_CAPSULE | Freq: Three times a day (TID) | ORAL | 0 refills | Status: DC

## 2019-05-28 ENCOUNTER — Other Ambulatory Visit: Payer: Self-pay | Admitting: Family Medicine

## 2019-06-12 ENCOUNTER — Ambulatory Visit (INDEPENDENT_AMBULATORY_CARE_PROVIDER_SITE_OTHER): Payer: Medicare Other | Admitting: Family Medicine

## 2019-06-12 ENCOUNTER — Other Ambulatory Visit: Payer: Self-pay

## 2019-06-12 ENCOUNTER — Encounter: Payer: Self-pay | Admitting: Family Medicine

## 2019-06-12 VITALS — BP 138/80 | HR 88 | Temp 97.4°F | Resp 15 | Ht 65.0 in | Wt 199.0 lb

## 2019-06-12 DIAGNOSIS — E118 Type 2 diabetes mellitus with unspecified complications: Secondary | ICD-10-CM | POA: Diagnosis not present

## 2019-06-12 DIAGNOSIS — Z0000 Encounter for general adult medical examination without abnormal findings: Secondary | ICD-10-CM

## 2019-06-12 DIAGNOSIS — E785 Hyperlipidemia, unspecified: Secondary | ICD-10-CM | POA: Diagnosis not present

## 2019-06-12 DIAGNOSIS — I10 Essential (primary) hypertension: Secondary | ICD-10-CM

## 2019-06-12 NOTE — Patient Instructions (Addendum)
F/U in office with MD in 5 months, call if you need me before  Please get lipid panel, cmp and EGFr, HBA1C, TSH today  Second shingrix is now due , please get this as soon as possible!  It is important that you exercise regularly at least 30 minutes 5 times a week. If you develop chest pain, have severe difficulty breathing, or feel very tired, stop exercising immediately and seek medical attention   Think about what you will eat, plan ahead. Choose " clean, green, fresh or frozen" over canned, processed or packaged foods which are more sugary, salty and fatty. 70 to 75% of food eaten should be vegetables and fruit. Three meals at set times with snacks allowed between meals, but they must be fruit or vegetables. Aim to eat over a 12 hour period , example 7 am to 7 pm, and STOP after  your last meal of the day. Drink water,generally about 64 ounces per day, no other drink is as healthy. Fruit juice is best enjoyed in a healthy way, by EATING the fruit.  Thanks for choosing [facility], we consider it a privelige to serve you.

## 2019-06-13 ENCOUNTER — Encounter: Payer: Self-pay | Admitting: Family Medicine

## 2019-06-13 LAB — LIPID PANEL
Cholesterol: 146 mg/dL (ref <200)
HDL: 52 mg/dL (ref >=50)
LDL Cholesterol (Calc): 75 mg/dL (calc)
Non-HDL Cholesterol (Calc): 94 mg/dL (calc) (ref <130)
Total CHOL/HDL Ratio: 2.8 (calc) (ref <5.0)
Triglycerides: 107 mg/dL (ref <150)

## 2019-06-13 LAB — COMPLETE METABOLIC PANEL WITH GFR
AG Ratio: 0.9 (calc) — ABNORMAL LOW (ref 1.0–2.5)
ALT: 17 U/L (ref 6–29)
AST: 21 U/L (ref 10–35)
Albumin: 4.1 g/dL (ref 3.6–5.1)
Alkaline phosphatase (APISO): 60 U/L (ref 37–153)
BUN/Creatinine Ratio: 19 (calc) (ref 6–22)
BUN: 29 mg/dL — ABNORMAL HIGH (ref 7–25)
CO2: 24 mmol/L (ref 20–32)
Calcium: 9.9 mg/dL (ref 8.6–10.4)
Chloride: 110 mmol/L (ref 98–110)
Creat: 1.53 mg/dL — ABNORMAL HIGH (ref 0.60–0.88)
GFR, Est African American: 37 mL/min/{1.73_m2} — ABNORMAL LOW (ref >=60)
GFR, Est Non African American: 32 mL/min/{1.73_m2} — ABNORMAL LOW (ref >=60)
Globulin: 4.6 g/dL (calc) — ABNORMAL HIGH (ref 1.9–3.7)
Glucose, Bld: 112 mg/dL (ref 65–139)
Potassium: 4.2 mmol/L (ref 3.5–5.3)
Sodium: 140 mmol/L (ref 135–146)
Total Bilirubin: 0.3 mg/dL (ref 0.2–1.2)
Total Protein: 8.7 g/dL — ABNORMAL HIGH (ref 6.1–8.1)

## 2019-06-13 LAB — HEMOGLOBIN A1C
Hgb A1c MFr Bld: 6.8 % of total Hgb — ABNORMAL HIGH (ref <5.7)
Mean Plasma Glucose: 148 (calc)
eAG (mmol/L): 8.2 (calc)

## 2019-06-13 LAB — TSH: TSH: 1.71 mIU/L (ref 0.40–4.50)

## 2019-06-13 NOTE — Assessment & Plan Note (Signed)

## 2019-06-13 NOTE — Progress Notes (Signed)
    Renee Collins     MRN: [id number]      DOB: 06/13/1941  HPI: Patient is in for annual physical exam. No other health concerns are expressed Immunization is reviewed , and  Is up to date.   PE: BP 138/80   Pulse 88   Temp (!) 97.4 F (36.3 C) (Temporal)   Resp 15   Ht 5\' 5"  (1.651 m)   Wt 199 lb (90.3 kg)   SpO2 99%   BMI 33.12 kg/m   Pleasant  female, alert and oriented x 3, in no cardio-pulmonary distress. Afebrile. HEENT No facial trauma or asymetry. Sinuses non tender.  Extra occullar muscles intact.. External ears normal, . Neck: supple, no adenopathy,JVD or thyromegaly.No bruits.  Chest: Clear to ascultation bilaterally.No crackles or wheezes. Non tender to palpation  Breast: Not examined. Aymptomatic, has mammogram in 1 week  Cardiovascular system; Heart sounds normal,  S1 and  S2 ,no S3.  No murmur, or thrill. Apical beat not displaced Peripheral pulses normal.  Abdomen: Soft, non tender, no organomegaly or masses. No bruits. Bowel sounds normal. No guarding, tenderness or rebound.      Musculoskeletal exam: Full ROM of spine, hips , shoulders and reduced in  knees. No deformity ,swelling or crepitus noted. No muscle wasting or atrophy.   Neurologic: Cranial nerves 2 to 12 intact. Power, tone ,sensation and reflexes normal throughout. No disturbance in gait. No tremor.  Skin: Intact, no ulceration, erythema , scaling or rash noted. Pigmentation normal throughout  Psych; Normal mood and affect. Judgement and concentration normal   Assessment & Plan:  Annual physical exam Annual exam as documented. Counseling done  re healthy lifestyle involving commitment to 150 minutes exercise per week, heart healthy diet, and attaining healthy weight.The importance of adequate sleep also discussed. Regular seat belt use and home safety, is also discussed. Changes in health habits are decided on by the patient with goals and time frames  set for  achieving them. Immunization and cancer screening needs are specifically addressed at this visit.

## 2019-06-14 ENCOUNTER — Telehealth: Payer: Self-pay | Admitting: *Deleted

## 2019-06-14 NOTE — Telephone Encounter (Signed)
Pt was returning a call about her lab results

## 2019-06-14 NOTE — Telephone Encounter (Signed)
Spoke with patient and advised of results.

## 2019-06-17 ENCOUNTER — Other Ambulatory Visit: Payer: Self-pay | Admitting: Family Medicine

## 2019-06-18 ENCOUNTER — Ambulatory Visit (HOSPITAL_COMMUNITY)
Admission: RE | Admit: 2019-06-18 | Discharge: 2019-06-18 | Disposition: A | Discharge status: Home / Self Care | Payer: Medicare Other | Source: Ambulatory Visit | Attending: Family Medicine | Admitting: Family Medicine

## 2019-06-18 ENCOUNTER — Other Ambulatory Visit: Payer: Self-pay

## 2019-06-18 DIAGNOSIS — Z1231 Encounter for screening mammogram for malignant neoplasm of breast: Secondary | ICD-10-CM

## 2019-07-21 ENCOUNTER — Other Ambulatory Visit: Payer: Self-pay | Admitting: Nurse Practitioner

## 2019-07-31 ENCOUNTER — Other Ambulatory Visit: Payer: Self-pay | Admitting: Family Medicine

## 2019-08-01 ENCOUNTER — Telehealth: Payer: Self-pay

## 2019-08-01 ENCOUNTER — Other Ambulatory Visit: Payer: Self-pay

## 2019-08-01 MED ORDER — LEVOTHYROXINE SODIUM 100 MCG PO TABS: 100.0000 ug | ORAL_TABLET | Freq: Every day | ORAL | 1 refills | Status: DC

## 2019-08-01 NOTE — Telephone Encounter (Signed)
Pt is calling wanting to know why she cant get the levothyroxine (SYNTHROID) 100 MCG tablet  Please call her

## 2019-08-01 NOTE — Telephone Encounter (Signed)
Prescription refilled and sent to pharmacy.

## 2019-08-04 ENCOUNTER — Ambulatory Visit: Payer: Medicare Other | Attending: Internal Medicine

## 2019-08-04 DIAGNOSIS — Z23 Encounter for immunization: Secondary | ICD-10-CM

## 2019-08-04 NOTE — Progress Notes (Signed)
   Covid-19 Vaccination Clinic  Name:  Renee Collins    MRN: [id number] DOB: 1940-12-16  08/04/2019  [name] was observed post Covid-19 immunization for 15 minutes without incidence. She was provided with Vaccine Information Sheet and instruction to access the V-Safe system.   [name] was instructed to call 911 with any severe reactions post vaccine: Marland Kitchen Difficulty breathing  . Swelling of your face and throat  . A fast heartbeat  . A bad rash all over your body  . Dizziness and weakness    Immunizations Administered    Name Date Dose VIS Date Route   Moderna COVID-19 Vaccine 08/04/2019 12:33 PM 0.5 mL 05/29/2019 Intramuscular   Manufacturer: Moderna   Lot: [id number]   [place]: [id number]

## 2019-08-07 ENCOUNTER — Other Ambulatory Visit: Payer: Self-pay

## 2019-08-07 ENCOUNTER — Inpatient Hospital Stay (HOSPITAL_COMMUNITY): Payer: Medicare Other | Attending: Hematology

## 2019-08-07 DIAGNOSIS — N189 Chronic kidney disease, unspecified: Secondary | ICD-10-CM | POA: Diagnosis not present

## 2019-08-07 DIAGNOSIS — E785 Hyperlipidemia, unspecified: Secondary | ICD-10-CM | POA: Diagnosis not present

## 2019-08-07 DIAGNOSIS — E1122 Type 2 diabetes mellitus with diabetic chronic kidney disease: Secondary | ICD-10-CM | POA: Diagnosis not present

## 2019-08-07 DIAGNOSIS — E039 Hypothyroidism, unspecified: Secondary | ICD-10-CM | POA: Diagnosis not present

## 2019-08-07 DIAGNOSIS — Z7982 Long term (current) use of aspirin: Secondary | ICD-10-CM | POA: Diagnosis not present

## 2019-08-07 DIAGNOSIS — D472 Monoclonal gammopathy: Secondary | ICD-10-CM

## 2019-08-07 DIAGNOSIS — K219 Gastro-esophageal reflux disease without esophagitis: Secondary | ICD-10-CM | POA: Diagnosis not present

## 2019-08-07 DIAGNOSIS — I10 Essential (primary) hypertension: Secondary | ICD-10-CM | POA: Diagnosis not present

## 2019-08-07 DIAGNOSIS — Z79899 Other long term (current) drug therapy: Secondary | ICD-10-CM | POA: Insufficient documentation

## 2019-08-07 DIAGNOSIS — I129 Hypertensive chronic kidney disease with stage 1 through stage 4 chronic kidney disease, or unspecified chronic kidney disease: Secondary | ICD-10-CM | POA: Diagnosis not present

## 2019-08-07 DIAGNOSIS — D631 Anemia in chronic kidney disease: Secondary | ICD-10-CM | POA: Diagnosis not present

## 2019-08-07 LAB — COMPREHENSIVE METABOLIC PANEL
ALT: 28 U/L (ref 0–44)
AST: 33 U/L (ref 15–41)
Albumin: 4 g/dL (ref 3.5–5.0)
Alkaline Phosphatase: 64 U/L (ref 38–126)
Anion gap: 6 (ref 5–15)
BUN: 25 mg/dL — ABNORMAL HIGH (ref 8–23)
CO2: 25 mmol/L (ref 22–32)
Calcium: 9.6 mg/dL (ref 8.9–10.3)
Chloride: 108 mmol/L (ref 98–111)
Creatinine, Ser: 1.51 mg/dL — ABNORMAL HIGH (ref 0.44–1.00)
GFR calc Af Amer: 37 mL/min — ABNORMAL LOW (ref >60)
GFR calc non Af Amer: 32 mL/min — ABNORMAL LOW (ref >60)
Glucose, Bld: 127 mg/dL — ABNORMAL HIGH (ref 70–99)
Potassium: 3.9 mmol/L (ref 3.5–5.1)
Sodium: 139 mmol/L (ref 135–145)
Total Bilirubin: 0.6 mg/dL (ref 0.3–1.2)
Total Protein: 9.1 g/dL — ABNORMAL HIGH (ref 6.5–8.1)

## 2019-08-07 LAB — LACTATE DEHYDROGENASE: LDH: 148 U/L (ref 98–192)

## 2019-08-07 LAB — CBC WITH DIFFERENTIAL/PLATELET
Abs Immature Granulocytes: 0.02 10*3/uL (ref 0.00–0.07)
Basophils Absolute: 0 10*3/uL (ref 0.0–0.1)
Basophils Relative: 1 %
Eosinophils Absolute: 0.3 10*3/uL (ref 0.0–0.5)
Eosinophils Relative: 7 %
HCT: 35.8 % — ABNORMAL LOW (ref 36.0–46.0)
Hemoglobin: 10.8 g/dL — ABNORMAL LOW (ref 12.0–15.0)
Immature Granulocytes: 0 %
Lymphocytes Relative: 34 %
Lymphs Abs: 1.5 10*3/uL (ref 0.7–4.0)
MCH: 26.8 pg (ref 26.0–34.0)
MCHC: 30.2 g/dL (ref 30.0–36.0)
MCV: 88.8 fL (ref 80.0–100.0)
Monocytes Absolute: 0.4 10*3/uL (ref 0.1–1.0)
Monocytes Relative: 9 %
Neutro Abs: 2.2 10*3/uL (ref 1.7–7.7)
Neutrophils Relative %: 49 %
Platelets: 215 10*3/uL (ref 150–400)
RBC: 4.03 MIL/uL (ref 3.87–5.11)
RDW: 13.2 % (ref 11.5–15.5)
WBC: 4.5 10*3/uL (ref 4.0–10.5)
nRBC: 0 % (ref 0.0–0.2)

## 2019-08-07 LAB — IRON AND TIBC
Iron: 87 ug/dL (ref 28–170)
Saturation Ratios: 26 % (ref 10.4–31.8)
TIBC: 335 ug/dL (ref 250–450)
UIBC: 248 ug/dL

## 2019-08-07 LAB — FERRITIN: Ferritin: 96 ng/mL (ref 11–307)

## 2019-08-08 LAB — PROTEIN ELECTROPHORESIS, SERUM
A/G Ratio: 0.9 (ref 0.7–1.7)
Albumin ELP: 4 g/dL (ref 2.9–4.4)
Alpha-1-Globulin: 0.2 g/dL (ref 0.0–0.4)
Alpha-2-Globulin: 0.8 g/dL (ref 0.4–1.0)
Beta Globulin: 1 g/dL (ref 0.7–1.3)
Gamma Globulin: 2.4 g/dL — ABNORMAL HIGH (ref 0.4–1.8)
Globulin, Total: 4.4 g/dL — ABNORMAL HIGH (ref 2.2–3.9)
M-Spike, %: 2.1 g/dL — ABNORMAL HIGH
Total Protein ELP: 8.4 g/dL (ref 6.0–8.5)

## 2019-08-08 LAB — KAPPA/LAMBDA LIGHT CHAINS
Kappa free light chain: 232.9 mg/L — ABNORMAL HIGH (ref 3.3–19.4)
Kappa, lambda light chain ratio: 11.59 — ABNORMAL HIGH (ref 0.26–1.65)
Lambda free light chains: 20.1 mg/L (ref 5.7–26.3)

## 2019-08-14 ENCOUNTER — Ambulatory Visit (HOSPITAL_COMMUNITY): Payer: Medicare Other | Admitting: Nurse Practitioner

## 2019-08-14 NOTE — Progress Notes (Deleted)
[place], [place] [address]   CLINIC:  Medical Oncology/Hematology  PCP:  [name], MD [address] [place] [address] [phone]   REASON FOR VISIT:  Follow-up for ***  CURRENT THERAPY: ***  BRIEF ONCOLOGIC HISTORY:  Oncology History   No history exists.     CANCER STAGING: Cancer Staging No matching staging information was found for the patient.   INTERVAL HISTORY:  Renee Collins 79 y.o. female returns for routine follow-up and consideration for next cycle of chemotherapy.   Due for cycle #*** of *** today.   Overall, she tells me she has been feeling pretty well. Energy levels ***; appetite ***.   Overall, she feels ready for next cycle of chemo today.     REVIEW OF SYSTEMS:  Review of Systems - Oncology   PAST MEDICAL/SURGICAL HISTORY:  Past Medical History:  Diagnosis Date  . Anemia   . Chronic back pain   . Depression   . Diabetes mellitus   . GERD (gastroesophageal reflux disease)   . Hyperlipidemia   . Hypertension   . Hypothyroidism    Past Surgical History:  Procedure Laterality Date  . BONE MARROW ASPIRATION Left 05/16/15  . BONE MARROW BIOPSY Left 05/16/15  . COLONOSCOPY  03/30/2012   Procedure: COLONOSCOPY;  Surgeon: Rogene Houston, MD;  [facility];  Service: Endoscopy;  Laterality: N/A;  730  . COLONOSCOPY N/A 06/30/2017   Procedure: COLONOSCOPY;  Surgeon: Rogene Houston, MD;  [facility];  Service: Endoscopy;  Laterality: N/A;  2:00  . LAMINECTOMY  1980's  . POLYPECTOMY  06/30/2017   Procedure: POLYPECTOMY;  Surgeon: Rogene Houston, MD;  [facility];  Service: Endoscopy;;  colon  . SPINE SURGERY  approx 1983   dr Joya Salm  . TOTAL ABDOMINAL HYSTERECTOMY  approx 1993   fibroids      SOCIAL HISTORY:  Social History   Socioeconomic History  . Marital status: Divorced    Spouse name: Not on file  . Number of children: 3  . Years  of education: Not on file  . Highest education level: Not on file  Occupational History  . Occupation: LPN  Tobacco Use  . Smoking status: Never Smoker  . Smokeless tobacco: Never Used  Substance and Sexual Activity  . Alcohol use: No  . Drug use: No  . Sexual activity: Not Currently  Other Topics Concern  . Not on file  Social History Narrative   Pt has 2 living adult children, 1 desease at age 18 secondary congential heart disease    Social Determinants of Health   Financial Resource Strain:   . Difficulty of Paying Living Expenses: Not on file  Food Insecurity:   . Worried About Charity fundraiser in the Last Year: Not on file  . Ran Out of Food in the Last Year: Not on file  Transportation Needs:   . Lack of Transportation (Medical): Not on file  . Lack of Transportation (Non-Medical): Not on file  Physical Activity:   . Days of Exercise per Week: Not on file  . Minutes of Exercise per Session: Not on file  Stress:   . Feeling of Stress : Not on file  Social Connections:   . Frequency of Communication with Friends and Family: Not on file  . Frequency of Social Gatherings with Friends and Family: Not on file  . Attends Religious Services:  Not on file  . Active Member of Clubs or Organizations: Not on file  . Attends Archivist Meetings: Not on file  . Marital Status: Not on file  Intimate Partner Violence:   . Fear of Current or Ex-Partner: Not on file  . Emotionally Abused: Not on file  . Physically Abused: Not on file  . Sexually Abused: Not on file    FAMILY HISTORY:  Family History  Problem Relation Age of Onset  . Heart attack Mother 35  . Heart disease Mother   . Cancer Mother        type unknown  . Diabetes Mother   . Kidney failure Father   . Kidney disease Father   . Myasthenia gravis Brother   . Diabetes Sister   . Breast cancer Sister 23  . Heart disease Brother   . Diabetes Sister   . Sickle cell anemia Sister   . Cancer Sister          unknown type    CURRENT MEDICATIONS:  Outpatient Encounter Medications as of 08/14/2019  Medication Sig  . acetaminophen (TYLENOL 8 HOUR ARTHRITIS PAIN) 650 MG CR tablet Take 650 mg by mouth every 8 (eight) hours as needed for pain.  Marland Kitchen amLODipine (NORVASC) 10 MG tablet TAKE 1 TABLET BY MOUTH  DAILY  . aspirin (ASPIRIN ADULT LOW STRENGTH) 81 MG EC tablet Take 1 tablet (81 mg total) by mouth daily.  . benazepril (LOTENSIN) 20 MG tablet TAKE 1 TABLET BY MOUTH  DAILY  . Calcium-Magnesium-Vitamin D (CALCIUM 1200+D3 PO) Take 1 tablet by mouth 2 (two) times daily.  . diphenhydrAMINE (BENADRYL) 25 mg capsule Take 25 mg by mouth at bedtime as needed (for allergies/sleep.).   Marland Kitchen gabapentin (NEURONTIN) 100 MG capsule TAKE 1 TO 2 CAPSULES BY  MOUTH 3 TIMES DAILY  . hydrochlorothiazide (HYDRODIURIL) 25 MG tablet TAKE 1 TABLET BY MOUTH  DAILY  . levothyroxine (SYNTHROID) 100 MCG tablet Take 1 tablet (100 mcg total) by mouth daily.  . Multiple Vitamin (MULTIVITAMIN WITH MINERALS) TABS tablet Take 1 tablet by mouth daily.  Marland Kitchen omeprazole (PRILOSEC) 20 MG capsule Take 20 mg by mouth daily as needed (for acid reflux.).   Marland Kitchen polyethylene glycol powder (GLYCOLAX/MIRALAX) powder Take 17 g by mouth daily as needed (FOR CONSTIPATION.).  Marland Kitchen pravastatin (PRAVACHOL) 20 MG tablet TAKE 1 TABLET BY MOUTH IN  THE EVENING   No facility-administered encounter medications on file as of 08/14/2019.    ALLERGIES:  No Known Allergies   PHYSICAL EXAM:  ECOG Performance status: ***  There were no vitals filed for this visit. There were no vitals filed for this visit.  Physical Exam   LABORATORY DATA:  I have reviewed the labs as listed.  CBC    Component Value Date/Time   WBC 4.5 08/07/2019 1109   RBC 4.03 08/07/2019 1109   HGB 10.8 (L) 08/07/2019 1109   HCT 35.8 (L) 08/07/2019 1109   PLT 215 08/07/2019 1109   MCV 88.8 08/07/2019 1109   MCH 26.8 08/07/2019 1109   MCHC 30.2 08/07/2019 1109   RDW 13.2  08/07/2019 1109   LYMPHSABS 1.5 08/07/2019 1109   MONOABS 0.4 08/07/2019 1109   EOSABS 0.3 08/07/2019 1109   BASOSABS 0.0 08/07/2019 1109   CMP Latest Ref Rng & Units 08/07/2019 06/12/2019 02/06/2019  Glucose 70 - 99 mg/dL 127(H) 112 96  BUN 8 - 23 mg/dL 25(H) 29(H) 27(H)  Creatinine 0.44 - 1.00 mg/dL 1.51(H) 1.53(H) 1.47(H)  Sodium  135 - 145 mmol/L 139 140 137  Potassium 3.5 - 5.1 mmol/L 3.9 4.2 3.9  Chloride 98 - 111 mmol/L 108 110 109  CO2 22 - 32 mmol/L 25 24 20(L)  Calcium 8.9 - 10.3 mg/dL 9.6 9.9 9.4  Total Protein 6.5 - 8.1 g/dL 9.1(H) 8.7(H) 9.2(H)  Total Bilirubin 0.3 - 1.2 mg/dL 0.6 0.3 0.5  Alkaline Phos 38 - 126 U/L 64 - 53  AST 15 - 41 U/L 33 21 22  ALT 0 - 44 U/L _0 DIAGNOSTIC IMAGING:  I have independently reviewed the scans and discussed with the patient.   I have reviewed Francene Finders, NP's note and agree with the documentation.  I personally performed a face-to-face visit, made revisions and my assessment and plan is as follows.    ASSESSMENT & PLAN:   MGUS (monoclonal gammopathy of unknown significance) 1.  IgG kappa MGUS: -Bone marrow biopsy on 05/16/2015 showed limited bone marrow sample with no increase in plasma cells in the very limited material present.  Cytogenetics 54, XX.  FISH panel was normal. -Bone survey on 02/22/2017 showed ill-defined calcified lesions in the distal right femur. -MRI of the right femur on 03/02/2017 showed no osseous lesions. -Patient denies any fevers, night sweats, or weight loss.  Denies any recurrent infections. -Labs done on showed M spike, free light chain ratio, creatinine, hemoglobin, calcium -Skeletal survey on 02/06/2019 did not show any lytic lesions. -We reviewed normal prognosis of MGUS. -She will come back in 6 months with repeat labs.  2.  Normocytic anemia: -This is from chronic kidney disease. -She denies any bright red bleeding per rectum or melena. -Labs done on -We will repeat labs at her  next visit.  3.  CKD: -Creatinine is been stable between 1.4 and 1.5. -Labs done on      Orders placed this encounter:  No orders of the defined types were placed in this encounter.     Derek Jack, MD [place] [phone]

## 2019-08-14 NOTE — Assessment & Plan Note (Deleted)
1.  IgG kappa MGUS: -Bone marrow biopsy on 05/16/2015 showed limited bone marrow sample with no increase in plasma cells in the very limited material present.  Cytogenetics 46, XX.  FISH panel was normal. -Bone survey on 02/22/2017 showed ill-defined calcified lesions in the distal right femur. -MRI of the right femur on 03/02/2017 showed no osseous lesions. -Patient denies any fevers, night sweats, or weight loss.  Denies any recurrent infections. -Labs done on showed M spike, free light chain ratio, creatinine, hemoglobin, calcium -Skeletal survey on 02/06/2019 did not show any lytic lesions. -We reviewed normal prognosis of MGUS. -She will come back in 6 months with repeat labs.  2.  Normocytic anemia: -This is from chronic kidney disease. -She denies any bright red bleeding per rectum or melena. -Labs done on -We will repeat labs at her next visit.  3.  CKD: -Creatinine is been stable between 1.4 and 1.5. -Labs done on 

## 2019-08-22 ENCOUNTER — Inpatient Hospital Stay (HOSPITAL_COMMUNITY): Payer: Medicare Other | Admitting: Nurse Practitioner

## 2019-08-22 ENCOUNTER — Other Ambulatory Visit: Payer: Self-pay

## 2019-08-22 DIAGNOSIS — E039 Hypothyroidism, unspecified: Secondary | ICD-10-CM | POA: Diagnosis not present

## 2019-08-22 DIAGNOSIS — D472 Monoclonal gammopathy: Secondary | ICD-10-CM | POA: Diagnosis not present

## 2019-08-22 DIAGNOSIS — K219 Gastro-esophageal reflux disease without esophagitis: Secondary | ICD-10-CM | POA: Diagnosis not present

## 2019-08-22 DIAGNOSIS — Z79899 Other long term (current) drug therapy: Secondary | ICD-10-CM | POA: Diagnosis not present

## 2019-08-22 DIAGNOSIS — I10 Essential (primary) hypertension: Secondary | ICD-10-CM | POA: Diagnosis not present

## 2019-08-22 DIAGNOSIS — Z7982 Long term (current) use of aspirin: Secondary | ICD-10-CM | POA: Diagnosis not present

## 2019-08-22 DIAGNOSIS — E785 Hyperlipidemia, unspecified: Secondary | ICD-10-CM | POA: Diagnosis not present

## 2019-08-22 DIAGNOSIS — I129 Hypertensive chronic kidney disease with stage 1 through stage 4 chronic kidney disease, or unspecified chronic kidney disease: Secondary | ICD-10-CM | POA: Diagnosis not present

## 2019-08-22 DIAGNOSIS — N189 Chronic kidney disease, unspecified: Secondary | ICD-10-CM | POA: Diagnosis not present

## 2019-08-22 DIAGNOSIS — D631 Anemia in chronic kidney disease: Secondary | ICD-10-CM | POA: Diagnosis not present

## 2019-08-22 DIAGNOSIS — E1122 Type 2 diabetes mellitus with diabetic chronic kidney disease: Secondary | ICD-10-CM | POA: Diagnosis not present

## 2019-08-22 NOTE — Patient Instructions (Signed)
[facility] at [facility] Discharge Instructions  Follow up in 4 months with labs    Thank you for choosing [facility] at [facility] to provide your oncology and hematology care.  To afford each patient quality time with our provider, please arrive at least 15 minutes before your scheduled appointment time.   If you have a lab appointment with the Cancer Center please come in thru the Main Entrance and check in at the main information desk.  You need to re-schedule your appointment should you arrive 10 or more minutes late.  We strive to give you quality time with our providers, and arriving late affects you and other patients whose appointments are after yours.  Also, if you no show three or more times for appointments you may be dismissed from the clinic at the providers discretion.     Again, thank you for choosing [facility].  Our hope is that these requests will decrease the amount of time that you wait before being seen by our physicians.       _____________________________________________________________  Should you have questions after your visit to [facility], please contact our office at [phone] between the hours of 8:00 a.m. and 4:30 p.m.  Voicemails left after 4:00 p.m. will not be returned until the following business day.  For prescription refill requests, have your pharmacy contact our office and allow 72 hours.    Due to Covid, you will need to wear a mask upon entering the hospital. If you do not have a mask, a mask will be given to you at the Main Entrance upon arrival. For doctor visits, patients may have 1 support person with them. For treatment visits, patients can not have anyone with them due to social distancing guidelines and our immunocompromised population.      

## 2019-08-22 NOTE — Progress Notes (Signed)
[name], [place] [address]   CLINIC:  Medical Oncology/Hematology  PCP:  [name], MD [address] [place] [address] [phone]   REASON FOR VISIT: Follow-up for MGUS  CURRENT THERAPY: Observation   INTERVAL HISTORY:  Renee Collins 79 y.o. female returns for routine follow-up for MGUS. Patient reports she is doing well since her last visit. She has no complaints at this time. She denies any B symptoms. She denies any new bone pain. Denies any nausea, vomiting, or diarrhea. Denies any new pains. Had not noticed any recent bleeding such as epistaxis, hematuria or hematochezia. Denies recent chest pain on exertion, shortness of breath on minimal exertion, pre-syncopal episodes, or palpitations. Denies any numbness or tingling in hands or feet. Denies any recent fevers, infections, or recent hospitalizations. Patient reports appetite at 75% and energy level at 75%. She is eating well and maintaining her weight at this time.     REVIEW OF SYSTEMS:  Review of Systems  All other systems reviewed and are negative.    PAST MEDICAL/SURGICAL HISTORY:  Past Medical History:  Diagnosis Date  . Anemia   . Chronic back pain   . Depression   . Diabetes mellitus   . GERD (gastroesophageal reflux disease)   . Hyperlipidemia   . Hypertension   . Hypothyroidism    Past Surgical History:  Procedure Laterality Date  . BONE MARROW ASPIRATION Left 05/16/15  . BONE MARROW BIOPSY Left 05/16/15  . COLONOSCOPY  03/30/2012   Procedure: COLONOSCOPY;  Surgeon: Rogene Houston, MD;  [facility];  Service: Endoscopy;  Laterality: N/A;  730  . COLONOSCOPY N/A 06/30/2017   Procedure: COLONOSCOPY;  Surgeon: Rogene Houston, MD;  [facility];  Service: Endoscopy;  Laterality: N/A;  2:00  . LAMINECTOMY  1980's  . POLYPECTOMY  06/30/2017   Procedure: POLYPECTOMY;  Surgeon: Rogene Houston, MD;  [facility];   Service: Endoscopy;;  colon  . SPINE SURGERY  approx 1983   dr Joya Salm  . TOTAL ABDOMINAL HYSTERECTOMY  approx 1993   fibroids      SOCIAL HISTORY:  Social History   Socioeconomic History  . Marital status: Divorced    Spouse name: Not on file  . Number of children: 3  . Years of education: Not on file  . Highest education level: Not on file  Occupational History  . Occupation: LPN  Tobacco Use  . Smoking status: Never Smoker  . Smokeless tobacco: Never Used  Substance and Sexual Activity  . Alcohol use: No  . Drug use: No  . Sexual activity: Not Currently  Other Topics Concern  . Not on file  Social History Narrative   Pt has 2 living adult children, 1 desease at age 24 secondary congential heart disease    Social Determinants of Health   Financial Resource Strain:   . Difficulty of Paying Living Expenses: Not on file  Food Insecurity:   . Worried About Charity fundraiser in the Last Year: Not on file  . Ran Out of Food in the Last Year: Not on file  Transportation Needs:   . Lack of Transportation (Medical): Not on file  . Lack of Transportation (Non-Medical): Not on file  Physical Activity:   . Days of Exercise per Week: Not on file  . Minutes of Exercise per Session: Not on file  Stress:   . Feeling of Stress :  Not on file  Social Connections:   . Frequency of Communication with Friends and Family: Not on file  . Frequency of Social Gatherings with Friends and Family: Not on file  . Attends Religious Services: Not on file  . Active Member of Clubs or Organizations: Not on file  . Attends Archivist Meetings: Not on file  . Marital Status: Not on file  Intimate Partner Violence:   . Fear of Current or Ex-Partner: Not on file  . Emotionally Abused: Not on file  . Physically Abused: Not on file  . Sexually Abused: Not on file    FAMILY HISTORY:  Family History  Problem Relation Age of Onset  . Heart attack Mother 48  . Heart disease Mother    . Cancer Mother        type unknown  . Diabetes Mother   . Kidney failure Father   . Kidney disease Father   . Myasthenia gravis Brother   . Diabetes Sister   . Breast cancer Sister 9  . Heart disease Brother   . Diabetes Sister   . Sickle cell anemia Sister   . Cancer Sister        unknown type    CURRENT MEDICATIONS:  Outpatient Encounter Medications as of 08/22/2019  Medication Sig  . amLODipine (NORVASC) 10 MG tablet TAKE 1 TABLET BY MOUTH  DAILY  . aspirin (ASPIRIN ADULT LOW STRENGTH) 81 MG EC tablet Take 1 tablet (81 mg total) by mouth daily.  . benazepril (LOTENSIN) 20 MG tablet TAKE 1 TABLET BY MOUTH  DAILY  . Calcium-Magnesium-Vitamin D (CALCIUM 1200+D3 PO) Take 1 tablet by mouth 2 (two) times daily.  Marland Kitchen gabapentin (NEURONTIN) 100 MG capsule TAKE 1 TO 2 CAPSULES BY  MOUTH 3 TIMES DAILY  . hydrochlorothiazide (HYDRODIURIL) 25 MG tablet TAKE 1 TABLET BY MOUTH  DAILY  . levothyroxine (SYNTHROID) 100 MCG tablet Take 1 tablet (100 mcg total) by mouth daily.  . Multiple Vitamin (MULTIVITAMIN WITH MINERALS) TABS tablet Take 1 tablet by mouth daily.  Marland Kitchen omeprazole (PRILOSEC) 20 MG capsule Take 20 mg by mouth daily as needed (for acid reflux.).   Marland Kitchen pravastatin (PRAVACHOL) 20 MG tablet TAKE 1 TABLET BY MOUTH IN  THE EVENING  . acetaminophen (TYLENOL 8 HOUR ARTHRITIS PAIN) 650 MG CR tablet Take 650 mg by mouth every 8 (eight) hours as needed for pain.  . diphenhydrAMINE (BENADRYL) 25 mg capsule Take 25 mg by mouth at bedtime as needed (for allergies/sleep.).   Marland Kitchen polyethylene glycol powder (GLYCOLAX/MIRALAX) powder Take 17 g by mouth daily as needed (FOR CONSTIPATION.).   No facility-administered encounter medications on file as of 08/22/2019.    ALLERGIES:  No Known Allergies   PHYSICAL EXAM:  ECOG Performance status: 1  Vitals:   08/22/19 0858  BP: 138/79  Pulse: 96  Resp: 14  Temp: 97.9 F (36.6 C)  SpO2: 99%   Filed Weights   08/22/19 0858  Weight: 203 lb (92.1 kg)     Physical Exam Constitutional:      Appearance: Normal appearance. She is normal weight.  Cardiovascular:     Rate and Rhythm: Normal rate and regular rhythm.     Heart sounds: Normal heart sounds.  Pulmonary:     Effort: Pulmonary effort is normal.     Breath sounds: Normal breath sounds.  Abdominal:     General: Bowel sounds are normal.     Palpations: Abdomen is soft.  Musculoskeletal:  General: Normal range of motion.  Skin:    General: Skin is warm.  Neurological:     Mental Status: She is alert and oriented to person, place, and time. Mental status is at baseline.  Psychiatric:        Mood and Affect: Mood normal.        Behavior: Behavior normal.        Thought Content: Thought content normal.        Judgment: Judgment normal.      LABORATORY DATA:  I have reviewed the labs as listed.  CBC    Component Value Date/Time   WBC 4.5 08/07/2019 1109   RBC 4.03 08/07/2019 1109   HGB 10.8 (L) 08/07/2019 1109   HCT 35.8 (L) 08/07/2019 1109   PLT 215 08/07/2019 1109   MCV 88.8 08/07/2019 1109   MCH 26.8 08/07/2019 1109   MCHC 30.2 08/07/2019 1109   RDW 13.2 08/07/2019 1109   LYMPHSABS 1.5 08/07/2019 1109   MONOABS 0.4 08/07/2019 1109   EOSABS 0.3 08/07/2019 1109   BASOSABS 0.0 08/07/2019 1109   CMP Latest Ref Rng & Units 08/07/2019 06/12/2019 02/06/2019  Glucose 70 - 99 mg/dL 127(H) 112 96  BUN 8 - 23 mg/dL 25(H) 29(H) 27(H)  Creatinine 0.44 - 1.00 mg/dL 1.51(H) 1.53(H) 1.47(H)  Sodium 135 - 145 mmol/L 139 140 137  Potassium 3.5 - 5.1 mmol/L 3.9 4.2 3.9  Chloride 98 - 111 mmol/L 108 110 109  CO2 22 - 32 mmol/L 25 24 20(L)  Calcium 8.9 - 10.3 mg/dL 9.6 9.9 9.4  Total Protein 6.5 - 8.1 g/dL 9.1(H) 8.7(H) 9.2(H)  Total Bilirubin 0.3 - 1.2 mg/dL 0.6 0.3 0.5  Alkaline Phos 38 - 126 U/L 64 - 53  AST 15 - 41 U/L 33 21 22  ALT 0 - 44 U/L '28 17 20     '$ I personally performed a face-to-face visit.  All questions were answered to patient's stated satisfaction.  Encouraged patient to call with any new concerns or questions before his next visit to the cancer center and we can certain see him sooner, if needed.     ASSESSMENT & PLAN:   MGUS (monoclonal gammopathy of unknown significance) 1.  IgG kappa MGUS: -Bone marrow biopsy on 05/16/2015 showed limited bone marrow sample with no increase in plasma cells in the very limited material present.  Cytogenetics 66, XX.  FISH panel was normal. -Bone survey on 02/22/2017 showed ill-defined calcified lesions in the distal right femur. -MRI of the right femur on 03/02/2017 showed no osseous lesions. -Patient denies any fevers, night sweats, or weight loss.  Denies any recurrent infections. -Skeletal survey on 02/06/2019 did not show any lytic lesions. -Labs done on 08/07/2019 showed M spike 2.1 g/dL, free light chain ratio 11.59, creatinine 1.51, hemoglobin 10.8, calcium 9.6. -Labs are unchanged since from 6 months ago. -We reviewed normal prognosis of MGUS. -She will come back in 4 months with repeat labs.  2.  Normocytic anemia: -This is from chronic kidney disease. -She denies any bright red bleeding per rectum or melena. -Labs done on 08/07/2019 showed hemoglobin 10.8, ferritin 96, percent saturation 26 -We will repeat labs at her next visit.  3.  CKD: -Creatinine is been stable between 1.4 and 1.5. -Labs done on 08/07/2019 showed creatinine 1.51.       Orders placed this encounter:  Orders Placed This Encounter  Procedures  . Lactate dehydrogenase  . Protein electrophoresis, serum  . Immunofixation electrophoresis  . Kappa/lambda  light chains  . CBC with Differential/Platelet  . Comprehensive metabolic panel  . Ferritin  . Iron and TIBC  . Vitamin B12  . VITAMIN D 25 Hydroxy (Vit-D Deficiency, Fractures)  . Folate      Francene Finders, FNP-C [place] [phone]

## 2019-08-22 NOTE — Assessment & Plan Note (Addendum)
1.  IgG kappa MGUS: -Bone marrow biopsy on 05/16/2015 showed limited bone marrow sample with no increase in plasma cells in the very limited material present.  Cytogenetics 30, XX.  FISH panel was normal. -Bone survey on 02/22/2017 showed ill-defined calcified lesions in the distal right femur. -MRI of the right femur on 03/02/2017 showed no osseous lesions. -Patient denies any fevers, night sweats, or weight loss.  Denies any recurrent infections. -Skeletal survey on 02/06/2019 did not show any lytic lesions. -Labs done on 08/07/2019 showed M spike 2.1 g/dL, free light chain ratio 11.59, creatinine 1.51, hemoglobin 10.8, calcium 9.6. -Labs are unchanged since from 6 months ago. -We reviewed normal prognosis of MGUS. -She will come back in 4 months with repeat labs.  2.  Normocytic anemia: -This is from chronic kidney disease. -She denies any bright red bleeding per rectum or melena. -Labs done on 08/07/2019 showed hemoglobin 10.8, ferritin 96, percent saturation 26 -We will repeat labs at her next visit.  3.  CKD: -Creatinine is been stable between 1.4 and 1.5. -Labs done on 08/07/2019 showed creatinine 1.51.

## 2019-09-04 ENCOUNTER — Ambulatory Visit: Payer: Self-pay | Attending: Internal Medicine

## 2019-09-04 DIAGNOSIS — Z23 Encounter for immunization: Secondary | ICD-10-CM | POA: Insufficient documentation

## 2019-09-04 NOTE — Progress Notes (Signed)
   Covid-19 Vaccination Clinic  Name:  SAHAR RYBACK    MRN: [id number] DOB: 01/10/1941  09/04/2019  Ms. Warga was observed post Covid-19 immunization for 30 minutes based on pre-vaccination screening without incident. She was provided with Vaccine Information Sheet and instruction to access the V-Safe system.   Ms. Jarquin was instructed to call 911 with any severe reactions post vaccine: Marland Kitchen Difficulty breathing  . Swelling of face and throat  . A fast heartbeat  . A bad rash all over body  . Dizziness and weakness   Immunizations Administered    Name Date Dose VIS Date Route   Moderna COVID-19 Vaccine 09/04/2019 12:24 PM 0.5 mL 05/29/2019 Intramuscular   Manufacturer: Moderna   Lot: [id number]   Julian: [id number]

## 2019-11-12 ENCOUNTER — Ambulatory Visit: Payer: Medicare Other | Admitting: Family Medicine

## 2019-11-15 DIAGNOSIS — E119 Type 2 diabetes mellitus without complications: Secondary | ICD-10-CM | POA: Diagnosis not present

## 2019-11-27 ENCOUNTER — Ambulatory Visit (INDEPENDENT_AMBULATORY_CARE_PROVIDER_SITE_OTHER): Payer: Medicare Other | Admitting: Family Medicine

## 2019-11-27 ENCOUNTER — Encounter: Payer: Self-pay | Admitting: Family Medicine

## 2019-11-27 ENCOUNTER — Ambulatory Visit: Payer: Medicare Other | Admitting: Internal Medicine

## 2019-11-27 ENCOUNTER — Ambulatory Visit (HOSPITAL_COMMUNITY)
Admission: RE | Admit: 2019-11-27 | Discharge: 2019-11-27 | Disposition: A | Discharge status: Home / Self Care | Payer: Medicare Other | Source: Ambulatory Visit | Attending: Family Medicine | Admitting: Family Medicine

## 2019-11-27 ENCOUNTER — Other Ambulatory Visit: Payer: Self-pay

## 2019-11-27 VITALS — BP 120/80 | HR 95 | Temp 97.2°F | Resp 15 | Ht 65.0 in | Wt 200.1 lb

## 2019-11-27 DIAGNOSIS — E1149 Type 2 diabetes mellitus with other diabetic neurological complication: Secondary | ICD-10-CM

## 2019-11-27 DIAGNOSIS — M5442 Lumbago with sciatica, left side: Secondary | ICD-10-CM | POA: Diagnosis not present

## 2019-11-27 DIAGNOSIS — N1832 Chronic kidney disease, stage 3b: Secondary | ICD-10-CM

## 2019-11-27 DIAGNOSIS — L608 Other nail disorders: Secondary | ICD-10-CM | POA: Diagnosis not present

## 2019-11-27 DIAGNOSIS — G8929 Other chronic pain: Secondary | ICD-10-CM

## 2019-11-27 DIAGNOSIS — E039 Hypothyroidism, unspecified: Secondary | ICD-10-CM

## 2019-11-27 DIAGNOSIS — I10 Essential (primary) hypertension: Secondary | ICD-10-CM

## 2019-11-27 DIAGNOSIS — B351 Tinea unguium: Secondary | ICD-10-CM | POA: Diagnosis not present

## 2019-11-27 DIAGNOSIS — E785 Hyperlipidemia, unspecified: Secondary | ICD-10-CM

## 2019-11-27 DIAGNOSIS — M545 Low back pain: Secondary | ICD-10-CM | POA: Diagnosis not present

## 2019-11-27 DIAGNOSIS — E669 Obesity, unspecified: Secondary | ICD-10-CM

## 2019-11-27 DIAGNOSIS — R6881 Early satiety: Secondary | ICD-10-CM | POA: Insufficient documentation

## 2019-11-27 DIAGNOSIS — E66811 Obesity, class 1: Secondary | ICD-10-CM

## 2019-11-27 MED ORDER — BUPROPION HCL ER (XL) 150 MG PO TB24: 150.0000 mg | ORAL_TABLET | Freq: Every day | ORAL | 3 refills | Status: DC

## 2019-11-27 NOTE — Assessment & Plan Note (Addendum)
new onset fatigue with multiple cV risk factors, eKG today Controlled, no change in medication DASH diet and commitment to daily physical activity for a minimum of 30 minutes discussed and encouraged, as a part of hypertension management. The importance of attaining a healthy weight is also discussed.  BP/Weight 11/27/2019 08/22/2019 06/12/2019 03/30/2019 02/13/2019 9/93/5701 01/01/9389  Systolic BP 300 923 300 762 263 335 456  Diastolic BP 80 79 80 65 65 68 74  Wt. (Lbs) 200.12 203 199 196 196.7 197 199  BMI 33.3 33.78 33.12 31.64 32.73 32.78 33.12

## 2019-11-27 NOTE — Progress Notes (Signed)
LIANNA SITZMANN     MRN: [id number]      DOB: 1940-10-05   HPI Ms. Perman is here for follow up and re-evaluation of chronic medical conditions, medication management and review of any available recent lab and radiology data.  Preventive health is updated, specifically  Cancer screening and Immunization.   Questions or concerns regarding consultations or procedures which the PT has had in the interim are  addressed. The PT denies any adverse reactions to current medications since the last visit.  3 week h/o poor appetite and early aiteity Mild symptoms of depression x 2 months, no trigger noted Has to rest with doing chores  Sometimes, with SOB x 1 month LBP non radiating , rated at an 8 when flared , normally a 2, most recent flare yesterday was on her feet even standing doing dishes is an 8, relieved with tylenol and rest Walking for under 20 mins is ok , otherwise back pain flares  ROS Denies recent fever or chills. Denies sinus pressure, nasal congestion, ear pain or sore throat. Denies chest congestion, productive cough or wheezing. Denies chest pains, palpitations and leg swelling, no PND or orthopnea Denies abdominal pain, nausea, vomiting,diarrhea or constipation.   Denies dysuria, frequency, hesitancy or incontinence.  Denies headaches, seizures, numbness, or tingling.  Denies skin break down or rash.   PE BP 120/80    Pulse 95    Temp (!) 97.2 F (36.2 C) (Temporal)    Resp 15    Ht 5\' 5"  (1.651 m)    Wt 200 lb 1.9 oz (90.8 kg)    SpO2 97%    BMI 33.30 kg/m    Patient alert and oriented and in no cardiopulmonary distress.  HEENT: No facial asymmetry, EOMI,     Neck supple .  Chest: Clear to auscultation bilaterally.  CVS: S1, S2 no murmurs, no S3.Regular rate.  ABD: Soft non tender.   Ext: No edema  MS: Decreased  ROM spine,adequate in  shoulders, hips and knees.  Skin: Intact, no ulcerations or rash noted.  Psych: Good eye contact, normal affect. Memory  intact not anxious but mildly depressed appearing.  CNS: CN 2-12 intact, power,  normal throughout.no focal deficits noted.   Assessment & Plan  Type 2 diabetes mellitus, controlled Updated lab needed at/ before next visit. Ms. Milbrath is reminded of the importance of commitment to daily physical activity for 30 minutes or more, as able and the need to limit carbohydrate intake to 30 to 60 grams per meal to help with blood sugar control.   The need to take medication as prescribed, test blood sugar as directed, and to call between visits if there is a concern that blood sugar is uncontrolled is also discussed.   Ms. Butikofer is reminded of the importance of daily foot exam, annual eye examination, and good blood sugar, blood pressure and cholesterol control.  Diabetic Labs Latest Ref Rng & Units 08/07/2019 06/12/2019 02/20/2019 02/06/2019 02/05/2019  HbA1c <5.7 % of total Hgb - 6.8(H) 6.5(H) - -  Microalbumin Not Estab. ug/mL - - 95.2(H) - 122.6(H)  Micro/Creat Ratio 0 - 29 mg/g creat - - 48(H) - 65(H)  Chol <200 mg/dL - 146 - - -  HDL > OR = 50 mg/dL - 52 - - -  Calc LDL mg/dL (calc) - 75 - - -  Triglycerides <150 mg/dL - 107 - - -  Creatinine 0.44 - 1.00 mg/dL 1.51(H) 1.53(H) - 1.47(H) -   BP/Weight  11/27/2019 08/22/2019 06/12/2019 03/30/2019 02/13/2019 07/16/4172 0/01/1447  Systolic BP 185 631 497 026 378 588 502  Diastolic BP 78 79 80 65 65 68 74  Wt. (Lbs) 200.12 203 199 196 196.7 197 199  BMI 33.3 33.78 33.12 31.64 32.73 32.78 33.12   Foot/eye exam completion dates Latest Ref Rng & Units 11/04/2018 06/12/2018  Eye Exam No Retinopathy No Retinopathy -  Foot Form Completion - - Done        Essential hypertension new onset fatigue with multiple cV risk factors, eKG today Controlled, no change in medication DASH diet and commitment to daily physical activity for a minimum of 30 minutes discussed and encouraged, as a part of hypertension management. The importance of attaining a healthy  weight is also discussed.  BP/Weight 11/27/2019 08/22/2019 06/12/2019 03/30/2019 02/13/2019 7/74/1287 02/01/7671  Systolic BP 094 709 628 366 294 765 465  Diastolic BP 80 79 80 65 65 68 74  Wt. (Lbs) 200.12 203 199 196 196.7 197 199  BMI 33.3 33.78 33.12 31.64 32.73 32.78 33.12       Hypothyroidism Updated lab needed at/ before next visit.   Obesity (BMI 30.0-34.9)  Patient re-educated about  the importance of commitment to a  minimum of 150 minutes of exercise per week as able.  The importance of healthy food choices with portion control discussed, as well as eating regularly and within a 12 hour window most days. The need to choose "clean , green" food 50 to 75% of the time is discussed, as well as to make water the primary drink and set a goal of 64 ounces water daily.    Weight /BMI 11/27/2019 08/22/2019 06/12/2019  WEIGHT 200 lb 1.9 oz 203 lb 199 lb  HEIGHT 5\' 5"  - 5\' 5"   BMI 33.3 kg/m2 33.78 kg/m2 33.12 kg/m2      Early satiety Will monitor x 6 weeks on antidepressant, if persists will need gI eval  CKD (chronic kidney disease) stage 3, GFR 30-59 ml/min Needs f/u appt

## 2019-11-27 NOTE — Assessment & Plan Note (Signed)
Will monitor x 6 weeks on antidepressant, if persists will need gI eval

## 2019-11-27 NOTE — Assessment & Plan Note (Signed)
Updated lab needed at/ before next visit.   

## 2019-11-27 NOTE — Assessment & Plan Note (Signed)
Needs f/u appt 

## 2019-11-27 NOTE — Assessment & Plan Note (Signed)
  Patient re-educated about  the importance of commitment to a  minimum of 150 minutes of exercise per week as able.  The importance of healthy food choices with portion control discussed, as well as eating regularly and within a 12 hour window most days. The need to choose "clean , green" food 50 to 75% of the time is discussed, as well as to make water the primary drink and set a goal of 64 ounces water daily.    Weight /BMI 11/27/2019 08/22/2019 06/12/2019  WEIGHT 200 lb 1.9 oz 203 lb 199 lb  HEIGHT 5\' 5"  - 5\' 5"   BMI 33.3 kg/m2 33.78 kg/m2 33.12 kg/m2

## 2019-11-27 NOTE — Assessment & Plan Note (Signed)
Updated lab needed at/ before next visit. Renee Collins is reminded of the importance of commitment to daily physical activity for 30 minutes or more, as able and the need to limit carbohydrate intake to 30 to 60 grams per meal to help with blood sugar control.   The need to take medication as prescribed, test blood sugar as directed, and to call between visits if there is a concern that blood sugar is uncontrolled is also discussed.   Renee Collins is reminded of the importance of daily foot exam, annual eye examination, and good blood sugar, blood pressure and cholesterol control.  Diabetic Labs Latest Ref Rng & Units 08/07/2019 06/12/2019 02/20/2019 02/06/2019 02/05/2019  HbA1c <5.7 % of total Hgb - 6.8(H) 6.5(H) - -  Microalbumin Not Estab. ug/mL - - 95.2(H) - 122.6(H)  Micro/Creat Ratio 0 - 29 mg/g creat - - 48(H) - 65(H)  Chol <200 mg/dL - 146 - - -  HDL > OR = 50 mg/dL - 52 - - -  Calc LDL mg/dL (calc) - 75 - - -  Triglycerides <150 mg/dL - 107 - - -  Creatinine 0.44 - 1.00 mg/dL 1.51(H) 1.53(H) - 1.47(H) -   BP/Weight 11/27/2019 08/22/2019 06/12/2019 03/30/2019 02/13/2019 5/63/8756 09/28/3293  Systolic BP 188 416 606 301 601 093 235  Diastolic BP 78 79 80 65 65 68 74  Wt. (Lbs) 200.12 203 199 196 196.7 197 199  BMI 33.3 33.78 33.12 31.64 32.73 32.78 33.12   Foot/eye exam completion dates Latest Ref Rng & Units 11/04/2018 06/12/2018  Eye Exam No Retinopathy No Retinopathy -  Foot Form Completion - - Done

## 2019-11-27 NOTE — Patient Instructions (Addendum)
F/U in 3 months re evaluate chronic problems  We will send for eye exam done in the past 2 months ( [facility]?)  New  For depression is wellbutrin once daily  Please get X ray of low back today, use ES tylenol 1 to 2 tablets daily  I am referring you to once weekly physical therapy  For 8 weeks  EKG today, due to exertional fatigue, this shows no sign of heart disease, which is good Please track your appetite, if not improving with new  medication call in about 6 to 8 weeks, I will refer you to GI at that time  You are referred for diabetic foot care  Fasting lipid, hBA1C, cmp and EGFr  And tSH this week please  Thanks for choosing [facility], we consider it a privelige to serve you.

## 2019-11-30 DIAGNOSIS — E785 Hyperlipidemia, unspecified: Secondary | ICD-10-CM | POA: Diagnosis not present

## 2019-11-30 DIAGNOSIS — E1149 Type 2 diabetes mellitus with other diabetic neurological complication: Secondary | ICD-10-CM | POA: Diagnosis not present

## 2019-12-01 LAB — HEMOGLOBIN A1C
Hgb A1c MFr Bld: 6.7 % of total Hgb — ABNORMAL HIGH (ref <5.7)
Mean Plasma Glucose: 146 (calc)
eAG (mmol/L): 8.1 (calc)

## 2019-12-01 LAB — COMPLETE METABOLIC PANEL WITH GFR
AG Ratio: 0.8 (calc) — ABNORMAL LOW (ref 1.0–2.5)
ALT: 18 U/L (ref 6–29)
AST: 23 U/L (ref 10–35)
Albumin: 4.1 g/dL (ref 3.6–5.1)
Alkaline phosphatase (APISO): 63 U/L (ref 37–153)
BUN/Creatinine Ratio: 16 (calc) (ref 6–22)
BUN: 25 mg/dL (ref 7–25)
CO2: 23 mmol/L (ref 20–32)
Calcium: 9.6 mg/dL (ref 8.6–10.4)
Chloride: 107 mmol/L (ref 98–110)
Creat: 1.58 mg/dL — ABNORMAL HIGH (ref 0.60–0.88)
GFR, Est African American: 35 mL/min/{1.73_m2} — ABNORMAL LOW (ref >=60)
GFR, Est Non African American: 31 mL/min/{1.73_m2} — ABNORMAL LOW (ref >=60)
Globulin: 4.9 g/dL (calc) — ABNORMAL HIGH (ref 1.9–3.7)
Glucose, Bld: 99 mg/dL (ref 65–99)
Potassium: 4.4 mmol/L (ref 3.5–5.3)
Sodium: 137 mmol/L (ref 135–146)
Total Bilirubin: 0.4 mg/dL (ref 0.2–1.2)
Total Protein: 9 g/dL — ABNORMAL HIGH (ref 6.1–8.1)

## 2019-12-01 LAB — LIPID PANEL
Cholesterol: 145 mg/dL (ref <200)
HDL: 51 mg/dL (ref >=50)
LDL Cholesterol (Calc): 79 mg/dL (calc)
Non-HDL Cholesterol (Calc): 94 mg/dL (calc) (ref <130)
Total CHOL/HDL Ratio: 2.8 (calc) (ref <5.0)
Triglycerides: 70 mg/dL (ref <150)

## 2019-12-01 LAB — TSH: TSH: 0.51 mIU/L (ref 0.40–4.50)

## 2019-12-03 ENCOUNTER — Other Ambulatory Visit: Payer: Self-pay | Admitting: Family Medicine

## 2019-12-03 ENCOUNTER — Encounter: Payer: Self-pay | Admitting: Family Medicine

## 2019-12-03 DIAGNOSIS — N183 Chronic kidney disease, stage 3 unspecified: Secondary | ICD-10-CM

## 2019-12-12 ENCOUNTER — Other Ambulatory Visit: Payer: Self-pay

## 2019-12-12 ENCOUNTER — Ambulatory Visit (HOSPITAL_COMMUNITY): Payer: Medicare Other | Attending: Family Medicine | Admitting: Physical Therapy

## 2019-12-12 DIAGNOSIS — M545 Low back pain, unspecified: Secondary | ICD-10-CM

## 2019-12-12 DIAGNOSIS — R262 Difficulty in walking, not elsewhere classified: Secondary | ICD-10-CM | POA: Diagnosis not present

## 2019-12-12 DIAGNOSIS — G8929 Other chronic pain: Secondary | ICD-10-CM

## 2019-12-12 NOTE — Therapy (Signed)
[place], [place], [address] Phone: [phone]   Fax:  [phone]  Physical Therapy Evaluation  Patient Details  Name: Renee Collins MRN: [id number] Date of Birth: [birth year] Referring Provider (PT): [name] MD   Encounter Date: 12/12/2019   PT End of Session - 12/12/19 1030    Visit Number 1    Number of Visits 6    Date for PT Re-Evaluation 01/23/20    Authorization Type medicare, no VL    Progress Note Due on Visit 10    PT Start Time 1030    PT Stop Time 1122    PT Time Calculation (min) 52 min    Activity Tolerance Patient tolerated treatment well    Behavior During Therapy [facility] for tasks assessed/performed           Past Medical History:  Diagnosis Date  . Anemia   . Chronic back pain   . Depression   . Diabetes mellitus   . GERD (gastroesophageal reflux disease)   . Hyperlipidemia   . Hypertension   . Hypothyroidism     Past Surgical History:  Procedure Laterality Date  . BONE MARROW ASPIRATION Left 05/16/15  . BONE MARROW BIOPSY Left 05/16/15  . COLONOSCOPY  03/30/2012   Procedure: COLONOSCOPY;  Surgeon: Rogene Houston, MD;  [facility];  Service: Endoscopy;  Laterality: N/A;  730  . COLONOSCOPY N/A 06/30/2017   Procedure: COLONOSCOPY;  Surgeon: Rogene Houston, MD;  Location: [facility];  Service: Endoscopy;  Laterality: N/A;  2:00  . LAMINECTOMY  1980's  . POLYPECTOMY  06/30/2017   Procedure: POLYPECTOMY;  Surgeon: Rogene Houston, MD;  Location: [facility];  Service: Endoscopy;;  colon  . SPINE SURGERY  approx 1983   dr Joya Salm  . TOTAL ABDOMINAL HYSTERECTOMY  approx 1993   fibroids     There were no vitals filed for this visit.    Subjective Assessment - 12/12/19 1153    Subjective Patient reports she has pain in her lower back and she can't stand for long periods of time. Started about 3 months ago. States max can stand is 30 minutes. Pain stays in  low back. Current pain is 4/10 because she just took Tylenol and gabapentin. At worse pain gets up to 8/10 and typically she is standing at kitchen sink washing dishes. Sitting down, medication and topical cream help ease pain. Pain does not wake her up at night. Walking is also very difficult due to pain. States she can walk about 5 minutes before pain gets real bad. States she used to walk about 30 minutes at a time. States she can do everything she needs to do she just has to rest in between.    Pertinent History DB, kidney disease, HTN    Currently in Pain? Yes    Pain Score 4     Pain Location Back    Pain Orientation Left    Pain Descriptors / Indicators Aching;Tightness    Pain Type Acute pain    Pain Onset More than a month ago    Pain Frequency Constant    Aggravating Factors  standing, walking, bending over, washing dishes    Pain Relieving Factors medication, rest, sitting              [facility] PT Assessment - 12/12/19 0001      Assessment   Medical Diagnosis Chronic LBP wiht L sided sciatica  Referring Provider (PT) [name] MD    Prior Therapy none       Precautions   Precautions None      Balance Screen   Has the patient fallen in the past 6 months No      [place] residence    Living Arrangements Children    Available Help at Discharge Family    Type of [place] Access Level entry    Home Layout One level      Prior Function   Level of [place]   Overall Cognitive Status Within Functional Limits for tasks assessed      Observation/Other Assessments   Focus on Therapeutic Outcomes (FOTO)  51% function      Posture/Postural Control   Posture Comments trunk flexed, limited in hip extension with standing      ROM / Strength   AROM / PROM / Strength AROM;Strength      AROM   AROM Assessment Site Lumbar;Hip    Right/Left Hip Right;Left    Right Hip Flexion 110     Right Hip External Rotation  45    Right Hip Internal Rotation  15    Left Hip Flexion 110    Left Hip External Rotation  45    Left Hip Internal Rotation  20    Lumbar Flexion 25% limited   no  change in symptoms   Lumbar Extension 75% limited   no change in symptoms   Lumbar - Right Side Bend 25% limited   felt good   Lumbar - Left Side Bend 50% limited   pain in low back and left side   Lumbar - Right Rotation 50% limited   no  change in symptoms   Lumbar - Left Rotation 50% limited   no  change in symptoms     Strength   Strength Assessment Site Hip;Knee    Right/Left Hip Right;Left    Right Hip Extension 2+/5    Left Hip Extension 2+/5    Right/Left Knee Right;Left      Palpation   Spinal mobility hypomobility in thoracic and lumbar spine - most painful at T12 and L5 with spring testing    Palpation comment Left glute med and max tender to palpation.       Special Tests   Other special tests Left Ely's + ; repeated extensions in prone- pain reduced                      Objective measurements completed on examination: See above findings.       [facility] PT Treatment/Exercise - 12/12/19 0001      Exercises   Exercises Lumbar      Lumbar Exercises: Stretches   Other Lumbar Stretch Exercise prone lying on pillow 3 minutes -->lumbar extension - prone - x30 5" holds     Other Lumbar Stretch Exercise standing lumbar extension over table 2x15 5" holds      Manual Therapy   Manual Therapy Soft tissue mobilization    Manual therapy comments all manual intervnetions performed independently of other interventions    Soft tissue mobilization STM to left glutes with wooden tool - felt good                  PT Education - 12/12/19 1150    Education Details educated patietn in current condition,  presentation and frequency of the exercises.    Person(s) Educated Patient    Methods Explanation            PT Short Term Goals - 12/12/19 1154      PT  SHORT TERM GOAL #1   Title Patient will be independent in self management strategies to improve quality of life and functional outcomes.    Time 3    Period Weeks    Status New    Target Date 01/02/20      PT SHORT TERM GOAL #2   Title Patient will report at least 50% improvement in overall symptoms and/or function to demonstrate improved functional mobility    Time 3    Period Weeks    Status New    Target Date 01/02/20             PT Long Term Goals - 12/12/19 1154      PT LONG TERM GOAL #1   Title Patient will report at least 75% improvement in overall symptoms and/or function to demonstrate improved functional mobility    Time 6    Period Weeks    Status New    Target Date 01/23/20      PT LONG TERM GOAL #2   Title Patietn will be able to walk at least 15 minutes at a time to improve ability to return to prior level of function and walking program    Time 6    Period Weeks    Status New    Target Date 01/23/20                  Plan - 12/12/19 1150    Clinical Impression Statement Patient presents with low back pain that started about 3 months ago. Patient presents with symptoms consistent with mechanical derangement syndrome with movement preference into lumbar extension. No pain noted end of session and session focused on education and strong HEP. Unsure if there is a frontal plane component to this derangement syndrome and patient would benefit from strengthening in more upright posture. Patient will be limited in PT participation secondary to financial limitations. Will focus on strong HEP with physical therapy. Patient would benefit from skilled physical therapy to return to her to optimal function.    Personal Factors and Comorbidities Comorbidity 1;Comorbidity 2    Comorbidities DB, HTN, kdney disease    Examination-Activity Limitations Bend;Carry;Hygiene/Grooming;Lift;Locomotion Level;Stand;Stairs    Examination-Participation Restrictions  Cleaning;Community Activity;Laundry;Meal Prep    Stability/Clinical Decision Making Stable/Uncomplicated    Clinical Decision Making Low    Rehab Potential Good    PT Frequency 1x / week    PT Duration 6 weeks    PT Treatment/Interventions ADLs/Self Care Home Management;Aquatic Therapy;Cryotherapy;Electrical Stimulation;Moist Heat;Traction;Balance training;Therapeutic exercise;Therapeutic activities;Functional mobility training;Stair training;Gait training;Neuromuscular re-education;Patient/family education;Manual techniques;Dry needling;Passive range of motion;Joint Manipulations    PT Next Visit Plan f/u with lumbar extension, add in frontal plane motion as tolerated, reassess ROM/pain    PT Home Exercise Plan 6/16 lumbar extension prone and standing, prone lying    Consulted and Agree with Plan of Care Patient           Patient will benefit from skilled therapeutic intervention in order to improve the following deficits and impairments:  Pain, Difficulty walking, Decreased strength, Decreased mobility, Decreased range of motion  Visit Diagnosis: Chronic midline low back pain without sciatica  Difficulty in walking, not elsewhere classified     Problem List Patient Active Problem List   Diagnosis  Date Noted  . Early satiety 11/27/2019  . Sciatica of right side 03/28/2018  . Obesity (BMI 30.0-34.9) 11/25/2017  . Annual physical exam 02/12/2015  . MGUS (monoclonal gammopathy of unknown significance) 08/13/2013  . Iron deficiency anemia due to chronic blood loss 08/13/2013  . CKD (chronic kidney disease) stage 3, GFR 30-59 ml/min 04/09/2013  . Cataracts, bilateral 04/09/2013  . Seasonal allergies 02/16/2011  . Type 2 diabetes mellitus, controlled ([place]) 01/28/2011  . THYROID NODULE 01/05/2010  . KNEE, ARTHRITIS, DEGEN./OSTEO 05/01/2009  . Hypothyroidism 11/11/2008  . Dyslipidemia 11/11/2008  . Anemia in chronic renal disease 11/11/2008  . Essential hypertension 06/14/2008     12:01 PM, 12/12/19 Jerene Pitch, DPT Physical Therapy with [facility]  [phone] office  [place] [address] [place], [place], [address] Phone: [phone]   Fax:  [phone]  Name: Renee Collins MRN: [id number] Date of Birth: Jul 25, 1940

## 2019-12-17 ENCOUNTER — Other Ambulatory Visit: Payer: Self-pay | Admitting: Family Medicine

## 2019-12-18 ENCOUNTER — Other Ambulatory Visit: Payer: Self-pay

## 2019-12-18 ENCOUNTER — Inpatient Hospital Stay (HOSPITAL_COMMUNITY): Payer: Medicare Other | Attending: Hematology

## 2019-12-18 DIAGNOSIS — E785 Hyperlipidemia, unspecified: Secondary | ICD-10-CM | POA: Insufficient documentation

## 2019-12-18 DIAGNOSIS — D472 Monoclonal gammopathy: Secondary | ICD-10-CM | POA: Insufficient documentation

## 2019-12-18 DIAGNOSIS — Z833 Family history of diabetes mellitus: Secondary | ICD-10-CM | POA: Insufficient documentation

## 2019-12-18 DIAGNOSIS — D649 Anemia, unspecified: Secondary | ICD-10-CM | POA: Diagnosis not present

## 2019-12-18 DIAGNOSIS — E039 Hypothyroidism, unspecified: Secondary | ICD-10-CM | POA: Insufficient documentation

## 2019-12-18 DIAGNOSIS — K219 Gastro-esophageal reflux disease without esophagitis: Secondary | ICD-10-CM | POA: Diagnosis not present

## 2019-12-18 DIAGNOSIS — I10 Essential (primary) hypertension: Secondary | ICD-10-CM | POA: Insufficient documentation

## 2019-12-18 DIAGNOSIS — F329 Major depressive disorder, single episode, unspecified: Secondary | ICD-10-CM | POA: Insufficient documentation

## 2019-12-18 DIAGNOSIS — Z79899 Other long term (current) drug therapy: Secondary | ICD-10-CM | POA: Insufficient documentation

## 2019-12-18 DIAGNOSIS — E119 Type 2 diabetes mellitus without complications: Secondary | ICD-10-CM | POA: Insufficient documentation

## 2019-12-18 DIAGNOSIS — N189 Chronic kidney disease, unspecified: Secondary | ICD-10-CM | POA: Insufficient documentation

## 2019-12-18 DIAGNOSIS — Z7982 Long term (current) use of aspirin: Secondary | ICD-10-CM | POA: Insufficient documentation

## 2019-12-18 DIAGNOSIS — Z8249 Family history of ischemic heart disease and other diseases of the circulatory system: Secondary | ICD-10-CM | POA: Insufficient documentation

## 2019-12-18 DIAGNOSIS — Z803 Family history of malignant neoplasm of breast: Secondary | ICD-10-CM | POA: Insufficient documentation

## 2019-12-18 LAB — COMPREHENSIVE METABOLIC PANEL
ALT: 21 U/L (ref 0–44)
AST: 24 U/L (ref 15–41)
Albumin: 3.9 g/dL (ref 3.5–5.0)
Alkaline Phosphatase: 63 U/L (ref 38–126)
Anion gap: 10 (ref 5–15)
BUN: 34 mg/dL — ABNORMAL HIGH (ref 8–23)
CO2: 21 mmol/L — ABNORMAL LOW (ref 22–32)
Calcium: 9.7 mg/dL (ref 8.9–10.3)
Chloride: 105 mmol/L (ref 98–111)
Creatinine, Ser: 2.05 mg/dL — ABNORMAL HIGH (ref 0.44–1.00)
GFR calc Af Amer: 26 mL/min — ABNORMAL LOW (ref >60)
GFR calc non Af Amer: 22 mL/min — ABNORMAL LOW (ref >60)
Glucose, Bld: 117 mg/dL — ABNORMAL HIGH (ref 70–99)
Potassium: 3.7 mmol/L (ref 3.5–5.1)
Sodium: 136 mmol/L (ref 135–145)
Total Bilirubin: 0.5 mg/dL (ref 0.3–1.2)
Total Protein: 9.4 g/dL — ABNORMAL HIGH (ref 6.5–8.1)

## 2019-12-18 LAB — VITAMIN B12: Vitamin B-12: 470 pg/mL (ref 180–914)

## 2019-12-18 LAB — LACTATE DEHYDROGENASE: LDH: 133 U/L (ref 98–192)

## 2019-12-18 LAB — CBC WITH DIFFERENTIAL/PLATELET
Abs Immature Granulocytes: 0.02 10*3/uL (ref 0.00–0.07)
Basophils Absolute: 0 10*3/uL (ref 0.0–0.1)
Basophils Relative: 1 %
Eosinophils Absolute: 0.4 10*3/uL (ref 0.0–0.5)
Eosinophils Relative: 8 %
HCT: 34.3 % — ABNORMAL LOW (ref 36.0–46.0)
Hemoglobin: 10.7 g/dL — ABNORMAL LOW (ref 12.0–15.0)
Immature Granulocytes: 0 %
Lymphocytes Relative: 39 %
Lymphs Abs: 1.9 10*3/uL (ref 0.7–4.0)
MCH: 26.6 pg (ref 26.0–34.0)
MCHC: 31.2 g/dL (ref 30.0–36.0)
MCV: 85.1 fL (ref 80.0–100.0)
Monocytes Absolute: 0.4 10*3/uL (ref 0.1–1.0)
Monocytes Relative: 9 %
Neutro Abs: 2.1 10*3/uL (ref 1.7–7.7)
Neutrophils Relative %: 43 %
Platelets: 223 10*3/uL (ref 150–400)
RBC: 4.03 MIL/uL (ref 3.87–5.11)
RDW: 13.3 % (ref 11.5–15.5)
WBC: 4.9 10*3/uL (ref 4.0–10.5)
nRBC: 0 % (ref 0.0–0.2)

## 2019-12-18 LAB — IRON AND TIBC
Iron: 50 ug/dL (ref 28–170)
Saturation Ratios: 16 % (ref 10.4–31.8)
TIBC: 322 ug/dL (ref 250–450)
UIBC: 272 ug/dL

## 2019-12-18 LAB — VITAMIN D 25 HYDROXY (VIT D DEFICIENCY, FRACTURES): Vit D, 25-Hydroxy: 41.92 ng/mL (ref 30–100)

## 2019-12-18 LAB — FOLATE: Folate: 43.8 ng/mL (ref >5.9)

## 2019-12-18 LAB — FERRITIN: Ferritin: 81 ng/mL (ref 11–307)

## 2019-12-21 LAB — PROTEIN ELECTROPHORESIS, SERUM
A/G Ratio: 0.8 (ref 0.7–1.7)
Albumin ELP: 3.9 g/dL (ref 2.9–4.4)
Alpha-1-Globulin: 0.3 g/dL (ref 0.0–0.4)
Alpha-2-Globulin: 1 g/dL (ref 0.4–1.0)
Beta Globulin: 1.2 g/dL (ref 0.7–1.3)
Gamma Globulin: 2.3 g/dL — ABNORMAL HIGH (ref 0.4–1.8)
Globulin, Total: 4.7 g/dL — ABNORMAL HIGH (ref 2.2–3.9)
M-Spike, %: 1.9 g/dL — ABNORMAL HIGH
Total Protein ELP: 8.6 g/dL — ABNORMAL HIGH (ref 6.0–8.5)

## 2019-12-21 LAB — KAPPA/LAMBDA LIGHT CHAINS
Kappa free light chain: 271.9 mg/L — ABNORMAL HIGH (ref 3.3–19.4)
Kappa, lambda light chain ratio: 13.53 — ABNORMAL HIGH (ref 0.26–1.65)
Lambda free light chains: 20.1 mg/L (ref 5.7–26.3)

## 2019-12-21 LAB — IMMUNOFIXATION ELECTROPHORESIS
IgA: 74 mg/dL (ref 64–422)
IgG (Immunoglobin G), Serum: 3125 mg/dL — ABNORMAL HIGH (ref 586–1602)
IgM (Immunoglobulin M), Srm: 47 mg/dL (ref 26–217)
Total Protein ELP: 8.5 g/dL (ref 6.0–8.5)

## 2019-12-24 ENCOUNTER — Encounter: Payer: Self-pay | Admitting: Podiatrist

## 2019-12-24 ENCOUNTER — Ambulatory Visit: Payer: Medicare Other | Admitting: Podiatrist

## 2019-12-24 ENCOUNTER — Other Ambulatory Visit: Payer: Self-pay

## 2019-12-24 VITALS — Temp 96.5°F

## 2019-12-24 DIAGNOSIS — B351 Tinea unguium: Secondary | ICD-10-CM

## 2019-12-24 DIAGNOSIS — M79675 Pain in left toe(s): Secondary | ICD-10-CM

## 2019-12-24 DIAGNOSIS — M79674 Pain in right toe(s): Secondary | ICD-10-CM | POA: Diagnosis not present

## 2019-12-24 NOTE — Progress Notes (Signed)
  Chief Complaint  Patient presents with  . Nail Problem    Thick, long toenails. Requests nail trim due to diabetes.  . Diabetes    Most recent A1c on file - 6.7. No known history of neuropathy or diabetic ulcers. Pt is unsure about her diagnosis.     HPI: Patient is 79 y.o. female who presents today for the concerns as listed above.  She relates she is diabetic and denies any symptoms of neuropathy.  Relates no foot issues or injuries in the past.,  States her toenails are long and thick and she is unable to trim them herself.    Review of Systems No fevers, chills, nausea, muscle aches, no difficulty breathing, no calf pain, no chest pain or shortness of breath.   Physical Exam  GENERAL APPEARANCE: Alert, conversant. Appropriately groomed. No acute distress.   VASCULAR: Pedal pulses palpable DP and PT bilateral.  Capillary refill time is immediate to all digits,  Proximal to distal cooling it warm to warm.  Digital perfusion adequate.   NEUROLOGIC: sensation is intact epicritically and protectively to 5.07 monofilament at 5/5 sites bilateral.  Light touch is intact bilateral, vibratory sensation intact bilateral, achilles tendon reflex is intact bilateral.   MUSCULOSKELETAL: acceptable muscle strength, tone and stability bilateral.  No gross boney pedal deformities noted.  No pain, crepitus or limitation noted with foot and ankle range of motion bilateral.   DERMATOLOGIC: skin is warm, supple, and dry.  No open lesions noted.  No rash, no pre ulcerative lesions. Digital nails are thick, discolored, dystrophic, brittle with subungual debris present and clinically mycotic x 10.         Assessment   Pain due to onychomycosis of toenails of both feet    Plan Debridement of toenails was recommended.  Onychoreduction of symptomatic toenails was performed via nail nipper and power burr without iatrogenic incident.  Patient was instructed on signs and symptoms of infection and was  told to call immediately should any of these arise.

## 2019-12-25 ENCOUNTER — Inpatient Hospital Stay (HOSPITAL_COMMUNITY): Payer: Medicare Other | Admitting: Nurse Practitioner

## 2019-12-25 DIAGNOSIS — N189 Chronic kidney disease, unspecified: Secondary | ICD-10-CM | POA: Diagnosis not present

## 2019-12-25 DIAGNOSIS — D649 Anemia, unspecified: Secondary | ICD-10-CM | POA: Diagnosis not present

## 2019-12-25 DIAGNOSIS — I10 Essential (primary) hypertension: Secondary | ICD-10-CM | POA: Diagnosis not present

## 2019-12-25 DIAGNOSIS — Z79899 Other long term (current) drug therapy: Secondary | ICD-10-CM | POA: Diagnosis not present

## 2019-12-25 DIAGNOSIS — K219 Gastro-esophageal reflux disease without esophagitis: Secondary | ICD-10-CM | POA: Diagnosis not present

## 2019-12-25 DIAGNOSIS — Z7982 Long term (current) use of aspirin: Secondary | ICD-10-CM | POA: Diagnosis not present

## 2019-12-25 DIAGNOSIS — Z803 Family history of malignant neoplasm of breast: Secondary | ICD-10-CM | POA: Diagnosis not present

## 2019-12-25 DIAGNOSIS — Z8249 Family history of ischemic heart disease and other diseases of the circulatory system: Secondary | ICD-10-CM | POA: Diagnosis not present

## 2019-12-25 DIAGNOSIS — E039 Hypothyroidism, unspecified: Secondary | ICD-10-CM | POA: Diagnosis not present

## 2019-12-25 DIAGNOSIS — D472 Monoclonal gammopathy: Secondary | ICD-10-CM

## 2019-12-25 DIAGNOSIS — E119 Type 2 diabetes mellitus without complications: Secondary | ICD-10-CM | POA: Diagnosis not present

## 2019-12-25 DIAGNOSIS — Z833 Family history of diabetes mellitus: Secondary | ICD-10-CM | POA: Diagnosis not present

## 2019-12-25 DIAGNOSIS — E785 Hyperlipidemia, unspecified: Secondary | ICD-10-CM | POA: Diagnosis not present

## 2019-12-25 NOTE — Assessment & Plan Note (Signed)
1.  IgG kappa MGUS: -Bone marrow biopsy on 05/16/2015 showed limited bone marrow sample with no increase in plasma cells in the very limited material present.  Cytogenetics 62, XX.  FISH panel was normal. -Bone survey on 02/22/2017 showed ill-defined calcified lesions in the distal right femur. -MRI of the right femur on 03/02/2017 showed no osseous lesions. -Patient denies any fevers, night sweats, or weight loss.  Denies any recurrent infections. -Skeletal survey on 02/06/2019 did not show any lytic lesions. -Labs done on 12/18/2019 showed M spike 1.9 g/dL, free light chain ratio 13.53, creatinine 2.05, hemoglobin 10.7, calcium 9.7. -Kidney function and free light chain ratio has increased.  We have referred her to a kidney doctor to her rule out any other causes of her CKD.  Recommended repeat bone marrow biopsy in the near future. -We will repeat labs in 6 weeks  2.  Normocytic anemia: -This is from chronic kidney disease. -She denies any bright red bleeding per rectum or melena. -Labs done on 12/18/2019 showed hemoglobin 10.7, ferritin 81, percent saturation 16 -We will repeat labs at her next visit.  3.  CKD: -Creatinine is been stable between 1.4 and 1.5. -Labs done on 08/07/2019 showed creatinine 1.51. -Labs done on 12/18/2019 showed creatinine 2.05

## 2019-12-25 NOTE — Progress Notes (Signed)
Renee Collins, [place] [address]   CLINIC:  Medical Oncology/Hematology  PCP:  [name], MD [address], [address] [place] [address] [phone]   REASON FOR VISIT: Follow-up for IgG kappa MGUS  CURRENT THERAPY: Observation   INTERVAL HISTORY:  [name] 79 y.o. female returns for routine follow-up for IgG kappa MGUS.  Patient reports she is doing well since her last visit.  She does report some fatigue throughout the day.  She denies any new bone pain. Denies any nausea, vomiting, or diarrhea. Denies any new pains. Had not noticed any recent bleeding such as epistaxis, hematuria or hematochezia. Denies recent chest pain on exertion, shortness of breath on minimal exertion, pre-syncopal episodes, or palpitations. Denies any numbness or tingling in hands or feet. Denies any recent fevers, infections, or recent hospitalizations. Patient reports appetite at 75% and energy level at 50%.   REVIEW OF SYSTEMS:  Review of Systems  All other systems reviewed and are negative.    PAST MEDICAL/SURGICAL HISTORY:  Past Medical History:  Diagnosis Date   Anemia    Chronic back pain    Depression    Diabetes mellitus    GERD (gastroesophageal reflux disease)    Hyperlipidemia    Hypertension    Hypothyroidism    Past Surgical History:  Procedure Laterality Date   BONE MARROW ASPIRATION Left 05/16/15   BONE MARROW BIOPSY Left 05/16/15   COLONOSCOPY  03/30/2012   Procedure: COLONOSCOPY;  Surgeon: Rogene Houston, MD;  [facility];  Service: Endoscopy;  Laterality: N/A;  730   COLONOSCOPY N/A 06/30/2017   Procedure: COLONOSCOPY;  Surgeon: Rogene Houston, MD;  [facility];  Service: Endoscopy;  Laterality: N/A;  2:00   LAMINECTOMY  1980's   POLYPECTOMY  06/30/2017   Procedure: POLYPECTOMY;  Surgeon: Rogene Houston, MD;  [facility];  Service: Endoscopy;;  colon   SPINE SURGERY  approx  1983   dr Joya Salm   TOTAL ABDOMINAL HYSTERECTOMY  approx 1993   fibroids      SOCIAL HISTORY:  Social History   Socioeconomic History   Marital status: Divorced    Spouse name: Not on file   Number of children: 3   Years of education: Not on file   Highest education level: Not on file  Occupational History   Occupation: LPN  Tobacco Use   Smoking status: Never Smoker   Smokeless tobacco: Never Used  Substance and Sexual Activity   Alcohol use: No   Drug use: No   Sexual activity: Not Currently  Other Topics Concern   Not on file  Social History Narrative   Pt has 2 living adult children, 1 desease at age 80 secondary congential heart disease    Social Determinants of Health   Financial Resource Strain:    Difficulty of Paying Living Expenses:   Food Insecurity:    Worried About Charity fundraiser in the Last Year:    Arboriculturist in the Last Year:   Transportation Needs:    Film/video editor (Medical):    Lack of Transportation (Non-Medical):   Physical Activity:    Days of Exercise per Week:    Minutes of Exercise per Session:   Stress:    Feeling of Stress :   Social Connections:    Frequency of Communication with Friends and Family:    Frequency of Social Gatherings with Friends  and Family:    Attends Religious Services:    Active Member of Clubs or Organizations:    Attends Engineer, structural:    Marital Status:   Intimate Partner Violence:    Fear of Current or Ex-Partner:    Emotionally Abused:    Physically Abused:    Sexually Abused:     FAMILY HISTORY:  Family History  Problem Relation Age of Onset   Heart attack Mother 42   Heart disease Mother    Cancer Mother        type unknown   Diabetes Mother    Kidney failure Father    Kidney disease Father    Myasthenia gravis Brother    Diabetes Sister    Breast cancer Sister 58   Heart disease Brother    Diabetes Sister    Sickle  cell anemia Sister    Cancer Sister        unknown type    CURRENT MEDICATIONS:  Outpatient Encounter Medications as of 12/25/2019  Medication Sig   amLODipine (NORVASC) 10 MG tablet TAKE 1 TABLET BY MOUTH  DAILY   aspirin (ASPIRIN ADULT LOW STRENGTH) 81 MG EC tablet Take 1 tablet (81 mg total) by mouth daily.   benazepril (LOTENSIN) 20 MG tablet TAKE 1 TABLET BY MOUTH  DAILY   buPROPion (WELLBUTRIN XL) 150 MG 24 hr tablet Take 1 tablet (150 mg total) by mouth daily.   Calcium-Magnesium-Vitamin D (CALCIUM 1200+D3 PO) Take 1 tablet by mouth 2 (two) times daily.   docusate sodium (COLACE) 50 MG capsule Take 50 mg by mouth daily.   gabapentin (NEURONTIN) 100 MG capsule TAKE 1 TO 2 CAPSULES BY  MOUTH 3 TIMES DAILY   hydrochlorothiazide (HYDRODIURIL) 25 MG tablet TAKE 1 TABLET BY MOUTH  DAILY   levothyroxine (SYNTHROID) 100 MCG tablet Take 1 tablet (100 mcg total) by mouth daily.   Multiple Vitamin (MULTIVITAMIN WITH MINERALS) TABS tablet Take 1 tablet by mouth daily.   pravastatin (PRAVACHOL) 20 MG tablet TAKE 1 TABLET BY MOUTH IN  THE EVENING   acetaminophen (TYLENOL 8 HOUR ARTHRITIS PAIN) 650 MG CR tablet Take 650 mg by mouth every 8 (eight) hours as needed for pain. (Patient not taking: Reported on 12/25/2019)   diphenhydrAMINE (BENADRYL) 25 mg capsule Take 25 mg by mouth at bedtime as needed (for allergies/sleep.).  (Patient not taking: Reported on 12/25/2019)   omeprazole (PRILOSEC) 20 MG capsule Take 20 mg by mouth daily as needed (for acid reflux.).  (Patient not taking: Reported on 12/25/2019)   No facility-administered encounter medications on file as of 12/25/2019.    ALLERGIES:  No Known Allergies   PHYSICAL EXAM:  ECOG Performance status: 1  Vital signs: BP 144/76 Pulse 99 Respiration 17 Temp 97.3 O2 98% on room air  Physical Exam Constitutional:      Appearance: Normal appearance. She is normal weight.  Cardiovascular:     Rate and Rhythm: Normal rate  and regular rhythm.     Heart sounds: Normal heart sounds.  Pulmonary:     Effort: Pulmonary effort is normal.     Breath sounds: Normal breath sounds.  Abdominal:     General: Bowel sounds are normal.     Palpations: Abdomen is soft.  Musculoskeletal:        General: Normal range of motion.  Skin:    General: Skin is warm.  Neurological:     Mental Status: She is alert and oriented to person, place, and time.  Mental status is at baseline.  Psychiatric:        Mood and Affect: Mood normal.        Behavior: Behavior normal.        Thought Content: Thought content normal.        Judgment: Judgment normal.      LABORATORY DATA:  I have reviewed the labs as listed.  CBC    Component Value Date/Time   WBC 4.9 12/18/2019 1028   RBC 4.03 12/18/2019 1028   HGB 10.7 (L) 12/18/2019 1028   HCT 34.3 (L) 12/18/2019 1028   PLT 223 12/18/2019 1028   MCV 85.1 12/18/2019 1028   MCH 26.6 12/18/2019 1028   MCHC 31.2 12/18/2019 1028   RDW 13.3 12/18/2019 1028   LYMPHSABS 1.9 12/18/2019 1028   MONOABS 0.4 12/18/2019 1028   EOSABS 0.4 12/18/2019 1028   BASOSABS 0.0 12/18/2019 1028   CMP Latest Ref Rng & Units 12/18/2019 11/30/2019 08/07/2019  Glucose 70 - 99 mg/dL 117(H) 99 127(H)  BUN 8 - 23 mg/dL 34(H) 25 25(H)  Creatinine 0.44 - 1.00 mg/dL 2.05(H) 1.58(H) 1.51(H)  Sodium 135 - 145 mmol/L 136 137 139  Potassium 3.5 - 5.1 mmol/L 3.7 4.4 3.9  Chloride 98 - 111 mmol/L 105 107 108  CO2 22 - 32 mmol/L 21(L) 23 25  Calcium 8.9 - 10.3 mg/dL 9.7 9.6 9.6  Total Protein 6.5 - 8.1 g/dL 9.4(H) 9.0(H) 9.1(H)  Total Bilirubin 0.3 - 1.2 mg/dL 0.5 0.4 0.6  Alkaline Phos 38 - 126 U/L 63 - 64  AST 15 - 41 U/L 24 23 33  ALT 0 - 44 U/L '21 18 28    '$ All questions were answered to patient's stated satisfaction. Encouraged patient to call with any new concerns or questions before his next visit to the cancer center and we can certain see him sooner, if needed.     ASSESSMENT & PLAN:  MGUS (monoclonal  gammopathy of unknown significance) 1.  IgG kappa MGUS: -Bone marrow biopsy on 05/16/2015 showed limited bone marrow sample with no increase in plasma cells in the very limited material present.  Cytogenetics 54, XX.  FISH panel was normal. -Bone survey on 02/22/2017 showed ill-defined calcified lesions in the distal right femur. -MRI of the right femur on 03/02/2017 showed no osseous lesions. -Patient denies any fevers, night sweats, or weight loss.  Denies any recurrent infections. -Skeletal survey on 02/06/2019 did not show any lytic lesions. -Labs done on 12/18/2019 showed M spike 1.9 g/dL, free light chain ratio 13.53, creatinine 2.05, hemoglobin 10.7, calcium 9.7. -Kidney function and free light chain ratio has increased.  We have referred her to a kidney doctor to her rule out any other causes of her CKD.  Recommended repeat bone marrow biopsy in the near future. -We will repeat labs in 6 weeks  2.  Normocytic anemia: -This is from chronic kidney disease. -She denies any bright red bleeding per rectum or melena. -Labs done on 12/18/2019 showed hemoglobin 10.7, ferritin 81, percent saturation 16 -We will repeat labs at her next visit.  3.  CKD: -Creatinine is been stable between 1.4 and 1.5. -Labs done on 08/07/2019 showed creatinine 1.51. -Labs done on 12/18/2019 showed creatinine 2.05     Orders placed this encounter:  Orders Placed This Encounter  Procedures   Lactate dehydrogenase   Protein electrophoresis, serum   Immunofixation electrophoresis   Kappa/lambda light chains   CBC with Differential/Platelet   Comprehensive metabolic panel   Ferritin  Iron and TIBC   Vitamin B12   VITAMIN D 25 Hydroxy (Vit-D Deficiency, Fractures)   Folate   IgG, IgA, IgM      Francene Finders, FNP-C [facility] [phone]

## 2019-12-26 ENCOUNTER — Encounter (HOSPITAL_COMMUNITY): Payer: Self-pay | Admitting: Physical Therapy

## 2019-12-26 ENCOUNTER — Ambulatory Visit (HOSPITAL_COMMUNITY): Payer: Medicare Other | Admitting: Physical Therapy

## 2019-12-26 ENCOUNTER — Other Ambulatory Visit: Payer: Self-pay

## 2019-12-26 DIAGNOSIS — M545 Low back pain, unspecified: Secondary | ICD-10-CM

## 2019-12-26 DIAGNOSIS — R262 Difficulty in walking, not elsewhere classified: Secondary | ICD-10-CM

## 2019-12-26 DIAGNOSIS — G8929 Other chronic pain: Secondary | ICD-10-CM

## 2019-12-26 NOTE — Therapy (Signed)
[facility] [address] [place], [place], [address] Phone: [phone]   Fax:  [phone]  Physical Therapy Treatment And Discharge Note  Patient Details  Name: Renee Collins MRN: [id number] Date of Birth: 10/07/1940 Referring Provider (PT): Margaret E Simpson MD  PHYSICAL THERAPY DISCHARGE SUMMARY  Visits from Start of Care: 2  Current functional level related to goals / functional outcomes: All short term goals met and 1/2 long term goals met   Remaining deficits: Continued walking endurance deficits   Education / Equipment: See below  Plan: Patient agrees to discharge.  Patient goals were partially met. Patient is being discharged due to meeting the stated rehab goals.  ?????       Encounter Date: 12/26/2019   PT End of Session - 12/26/19 0907    Visit Number 2    Number of Visits 6    Date for PT Re-Evaluation 01/23/20    Authorization Type medicare, no VL    Progress Note Due on Visit 10    PT Start Time 0908    PT Stop Time 0947    PT Time Calculation (min) 39 min    Activity Tolerance Patient tolerated treatment well    Behavior During Therapy WFL for tasks assessed/performed           Past Medical History:  Diagnosis Date  . Anemia   . Chronic back pain   . Depression   . Diabetes mellitus   . GERD (gastroesophageal reflux disease)   . Hyperlipidemia   . Hypertension   . Hypothyroidism     Past Surgical History:  Procedure Laterality Date  . BONE MARROW ASPIRATION Left 05/16/15  . BONE MARROW BIOPSY Left 05/16/15  . COLONOSCOPY  03/30/2012   Procedure: COLONOSCOPY;  Surgeon: Najeeb U Rehman, MD;  [facility];  Service: Endoscopy;  Laterality: N/A;  730  . COLONOSCOPY N/A 06/30/2017   Procedure: COLONOSCOPY;  Surgeon: Rehman, Najeeb U, MD;  [facility];  Service: Endoscopy;  Laterality: N/A;  2:00  . LAMINECTOMY  1980's  . POLYPECTOMY  06/30/2017   Procedure: POLYPECTOMY;   Surgeon: Rehman, Najeeb U, MD;  [facility];  Service: Endoscopy;;  colon  . SPINE SURGERY  approx 1983   dr botero  . TOTAL ABDOMINAL HYSTERECTOMY  approx 1993   fibroids     There were no vitals filed for this visit.   Subjective Assessment - 12/26/19 0922    Subjective States she is feeling better and has not taken a Tylenol since she was last in. States she has no difficulties or questions about exercise. Current pain level is zero. States the last time she had pain was last night after sitting at the computer but her exercises helped with the pain. States overall she feels about 80% better. States she would like to be able to walk longer, states she can currently walk about 10 minutes.    Currently in Pain? No/denies              [facility] PT Assessment - 12/26/19 0001      Assessment   Medical Diagnosis Chronic LBP wiht L sided sciatica    Referring Provider (PT) Margaret E Simpson MD      Observation/Other Assessments   Focus on Therapeutic Outcomes (FOTO)  71% function    WAS 51% FUNCTION      AROM   Lumbar Flexion 25% limited   no pain      Lumbar Extension 25% limited   no pain    Lumbar - Right Side Bend 25% limited   no pain    Lumbar - Left Side Bend 25% limited   no pain    Lumbar - Right Rotation 50% limited   no pain    Lumbar - Left Rotation 50% limited   no pain                         [facility] Adult PT Treatment/Exercise - 12/26/19 0001      Lumbar Exercises: Standing   Other Standing Lumbar Exercises hip extension 3x5 5" holds B       Lumbar Exercises: Seated   Sit to Stand 5 reps   4 SETS - NO ue ASSIST   Other Seated Lumbar Exercises seated trunk rotation 3x5 5" holds B                   PT Education - 12/26/19 0922    Education Details in walking program, in walking mechanics and pushing herself along. in HEP, plan moving forward and continued adherence to lumbar extension exercises    Person(s) Educated Patient     Methods Explanation    Comprehension Verbalized understanding            PT Short Term Goals - 12/26/19 0923      PT SHORT TERM GOAL #1   Title Patient will be independent in self management strategies to improve quality of life and functional outcomes.    Time 3    Period Weeks    Status Achieved    Target Date 01/02/20      PT SHORT TERM GOAL #2   Title Patient will report at least 50% improvement in overall symptoms and/or function to demonstrate improved functional mobility    Time 3    Period Weeks    Status Achieved    Target Date 01/02/20             PT Long Term Goals - 12/26/19 0924      PT LONG TERM GOAL #1   Title Patient will report at least 75% improvement in overall symptoms and/or function to demonstrate improved functional mobility    Time 6    Period Weeks    Status Achieved      PT LONG TERM GOAL #2   Title Patietn will be able to walk at least 15 minutes at a time to improve ability to return to prior level of function and walking program    Baseline can walk about 10 minutes    Time 6    Period Weeks    Status On-going                 Plan - 12/26/19 0908    Clinical Impression Statement Patient is doing well and has met all short term goals and  long term goals at this time. Reviewed HEP and FOTO score. Discussed plan moving forward and continued to develop HEP to work towards remaining goal. Patient to discharge from PT to HEP at this time secondary to progress made and independence in HEP.    Personal Factors and Comorbidities Comorbidity 1;Comorbidity 2    Comorbidities DB, HTN, kdney disease    Examination-Activity Limitations Bend;Carry;Hygiene/Grooming;Lift;Locomotion Level;Stand;Stairs    Examination-Participation Restrictions Cleaning;Community Activity;Laundry;Meal Prep    Stability/Clinical Decision Making Stable/Uncomplicated    Rehab Potential Good    PT Frequency 1x / week      PT Duration 6 weeks    PT  Treatment/Interventions ADLs/Self Care Home Management;Aquatic Therapy;Cryotherapy;Electrical Stimulation;Moist Heat;Traction;Balance training;Therapeutic exercise;Therapeutic activities;Functional mobility training;Stair training;Gait training;Neuromuscular re-education;Patient/family education;Manual techniques;Dry needling;Passive range of motion;Joint Manipulations    PT Next Visit Plan f/u with lumbar extension, add in frontal plane motion as tolerated, reassess ROM/pain    PT Home Exercise Plan 6/16 lumbar extension prone and standing, prone lying; 6/30 hip extension, trunk rotation, STS, walking program    Consulted and Agree with Plan of Care Patient           Patient will benefit from skilled therapeutic intervention in order to improve the following deficits and impairments:  Pain, Difficulty walking, Decreased strength, Decreased mobility, Decreased range of motion  Visit Diagnosis: Chronic midline low back pain without sciatica  Difficulty in walking, not elsewhere classified     Problem List Patient Active Problem List   Diagnosis Date Noted  . Early satiety 11/27/2019  . Sciatica of right side 03/28/2018  . Obesity (BMI 30.0-34.9) 11/25/2017  . Annual physical exam 02/12/2015  . MGUS (monoclonal gammopathy of unknown significance) 08/13/2013  . Iron deficiency anemia due to chronic blood loss 08/13/2013  . CKD (chronic kidney disease) stage 3, GFR 30-59 ml/min 04/09/2013  . Cataracts, bilateral 04/09/2013  . Seasonal allergies 02/16/2011  . Type 2 diabetes mellitus, controlled ([place]) 01/28/2011  . THYROID NODULE 01/05/2010  . KNEE, ARTHRITIS, DEGEN./OSTEO 05/01/2009  . Hypothyroidism 11/11/2008  . Dyslipidemia 11/11/2008  . Anemia in chronic renal disease 11/11/2008  . Essential hypertension 06/14/2008  9:50 AM, 12/26/19 Jerene Pitch, DPT Physical Therapy with [facility]  [phone] office  [place] [address] [place], [place], [address] Phone: [phone]   Fax:  [phone]  Name: LLUVIA GWYNNE MRN: [id number] Date of Birth: [birth year]

## 2020-01-03 ENCOUNTER — Other Ambulatory Visit: Payer: Self-pay | Admitting: Family Medicine

## 2020-01-17 DIAGNOSIS — D472 Monoclonal gammopathy: Secondary | ICD-10-CM | POA: Diagnosis not present

## 2020-01-17 DIAGNOSIS — R809 Proteinuria, unspecified: Secondary | ICD-10-CM | POA: Diagnosis not present

## 2020-01-17 DIAGNOSIS — N189 Chronic kidney disease, unspecified: Secondary | ICD-10-CM | POA: Diagnosis not present

## 2020-01-17 DIAGNOSIS — I129 Hypertensive chronic kidney disease with stage 1 through stage 4 chronic kidney disease, or unspecified chronic kidney disease: Secondary | ICD-10-CM | POA: Diagnosis not present

## 2020-01-17 DIAGNOSIS — D638 Anemia in other chronic diseases classified elsewhere: Secondary | ICD-10-CM | POA: Diagnosis not present

## 2020-01-18 ENCOUNTER — Other Ambulatory Visit (HOSPITAL_COMMUNITY): Payer: Self-pay | Admitting: Nephrology

## 2020-01-18 ENCOUNTER — Other Ambulatory Visit: Payer: Self-pay | Admitting: Nephrology

## 2020-01-18 DIAGNOSIS — E1129 Type 2 diabetes mellitus with other diabetic kidney complication: Secondary | ICD-10-CM

## 2020-01-21 DIAGNOSIS — N189 Chronic kidney disease, unspecified: Secondary | ICD-10-CM | POA: Diagnosis not present

## 2020-01-21 DIAGNOSIS — E1129 Type 2 diabetes mellitus with other diabetic kidney complication: Secondary | ICD-10-CM | POA: Diagnosis not present

## 2020-01-21 DIAGNOSIS — E1122 Type 2 diabetes mellitus with diabetic chronic kidney disease: Secondary | ICD-10-CM | POA: Diagnosis not present

## 2020-01-21 DIAGNOSIS — D472 Monoclonal gammopathy: Secondary | ICD-10-CM | POA: Diagnosis not present

## 2020-01-21 DIAGNOSIS — E559 Vitamin D deficiency, unspecified: Secondary | ICD-10-CM | POA: Diagnosis not present

## 2020-01-21 DIAGNOSIS — R809 Proteinuria, unspecified: Secondary | ICD-10-CM | POA: Diagnosis not present

## 2020-01-21 DIAGNOSIS — Z79899 Other long term (current) drug therapy: Secondary | ICD-10-CM | POA: Diagnosis not present

## 2020-01-24 ENCOUNTER — Other Ambulatory Visit: Payer: Self-pay

## 2020-01-24 ENCOUNTER — Ambulatory Visit (HOSPITAL_COMMUNITY)
Admission: RE | Admit: 2020-01-24 | Discharge: 2020-01-24 | Disposition: A | Discharge status: Home / Self Care | Payer: Medicare Other | Source: Ambulatory Visit | Attending: Nephrology | Admitting: Nephrology

## 2020-01-24 DIAGNOSIS — N281 Cyst of kidney, acquired: Secondary | ICD-10-CM | POA: Diagnosis not present

## 2020-01-24 DIAGNOSIS — R809 Proteinuria, unspecified: Secondary | ICD-10-CM | POA: Diagnosis not present

## 2020-01-24 DIAGNOSIS — E1129 Type 2 diabetes mellitus with other diabetic kidney complication: Secondary | ICD-10-CM | POA: Diagnosis not present

## 2020-01-29 ENCOUNTER — Inpatient Hospital Stay (HOSPITAL_COMMUNITY): Payer: Medicare Other | Attending: Hematology

## 2020-01-29 ENCOUNTER — Other Ambulatory Visit: Payer: Self-pay

## 2020-01-29 DIAGNOSIS — E1122 Type 2 diabetes mellitus with diabetic chronic kidney disease: Secondary | ICD-10-CM | POA: Insufficient documentation

## 2020-01-29 DIAGNOSIS — E785 Hyperlipidemia, unspecified: Secondary | ICD-10-CM | POA: Diagnosis not present

## 2020-01-29 DIAGNOSIS — R5383 Other fatigue: Secondary | ICD-10-CM | POA: Insufficient documentation

## 2020-01-29 DIAGNOSIS — Z8249 Family history of ischemic heart disease and other diseases of the circulatory system: Secondary | ICD-10-CM | POA: Insufficient documentation

## 2020-01-29 DIAGNOSIS — M2550 Pain in unspecified joint: Secondary | ICD-10-CM | POA: Insufficient documentation

## 2020-01-29 DIAGNOSIS — D649 Anemia, unspecified: Secondary | ICD-10-CM | POA: Diagnosis not present

## 2020-01-29 DIAGNOSIS — Z841 Family history of disorders of kidney and ureter: Secondary | ICD-10-CM | POA: Insufficient documentation

## 2020-01-29 DIAGNOSIS — N281 Cyst of kidney, acquired: Secondary | ICD-10-CM | POA: Insufficient documentation

## 2020-01-29 DIAGNOSIS — Z79899 Other long term (current) drug therapy: Secondary | ICD-10-CM | POA: Diagnosis not present

## 2020-01-29 DIAGNOSIS — N3289 Other specified disorders of bladder: Secondary | ICD-10-CM | POA: Diagnosis not present

## 2020-01-29 DIAGNOSIS — Z809 Family history of malignant neoplasm, unspecified: Secondary | ICD-10-CM | POA: Insufficient documentation

## 2020-01-29 DIAGNOSIS — N183 Chronic kidney disease, stage 3 unspecified: Secondary | ICD-10-CM | POA: Diagnosis not present

## 2020-01-29 DIAGNOSIS — Z832 Family history of diseases of the blood and blood-forming organs and certain disorders involving the immune mechanism: Secondary | ICD-10-CM | POA: Insufficient documentation

## 2020-01-29 DIAGNOSIS — E039 Hypothyroidism, unspecified: Secondary | ICD-10-CM | POA: Insufficient documentation

## 2020-01-29 DIAGNOSIS — I129 Hypertensive chronic kidney disease with stage 1 through stage 4 chronic kidney disease, or unspecified chronic kidney disease: Secondary | ICD-10-CM | POA: Diagnosis not present

## 2020-01-29 DIAGNOSIS — D472 Monoclonal gammopathy: Secondary | ICD-10-CM | POA: Insufficient documentation

## 2020-01-29 DIAGNOSIS — Z820 Family history of epilepsy and other diseases of the nervous system: Secondary | ICD-10-CM | POA: Insufficient documentation

## 2020-01-29 DIAGNOSIS — R630 Anorexia: Secondary | ICD-10-CM | POA: Diagnosis not present

## 2020-01-29 DIAGNOSIS — Z803 Family history of malignant neoplasm of breast: Secondary | ICD-10-CM | POA: Diagnosis not present

## 2020-01-29 LAB — COMPREHENSIVE METABOLIC PANEL
ALT: 22 U/L (ref 0–44)
AST: 26 U/L (ref 15–41)
Albumin: 3.9 g/dL (ref 3.5–5.0)
Alkaline Phosphatase: 68 U/L (ref 38–126)
Anion gap: 8 (ref 5–15)
BUN: 31 mg/dL — ABNORMAL HIGH (ref 8–23)
CO2: 18 mmol/L — ABNORMAL LOW (ref 22–32)
Calcium: 9.1 mg/dL (ref 8.9–10.3)
Chloride: 110 mmol/L (ref 98–111)
Creatinine, Ser: 1.93 mg/dL — ABNORMAL HIGH (ref 0.44–1.00)
GFR calc Af Amer: 28 mL/min — ABNORMAL LOW (ref >60)
GFR calc non Af Amer: 24 mL/min — ABNORMAL LOW (ref >60)
Glucose, Bld: 134 mg/dL — ABNORMAL HIGH (ref 70–99)
Potassium: 3.6 mmol/L (ref 3.5–5.1)
Sodium: 136 mmol/L (ref 135–145)
Total Bilirubin: 0.4 mg/dL (ref 0.3–1.2)
Total Protein: 9.3 g/dL — ABNORMAL HIGH (ref 6.5–8.1)

## 2020-01-29 LAB — CBC WITH DIFFERENTIAL/PLATELET
Abs Immature Granulocytes: 0.03 10*3/uL (ref 0.00–0.07)
Basophils Absolute: 0 10*3/uL (ref 0.0–0.1)
Basophils Relative: 0 %
Eosinophils Absolute: 0.2 10*3/uL (ref 0.0–0.5)
Eosinophils Relative: 5 %
HCT: 33.6 % — ABNORMAL LOW (ref 36.0–46.0)
Hemoglobin: 10.2 g/dL — ABNORMAL LOW (ref 12.0–15.0)
Immature Granulocytes: 1 %
Lymphocytes Relative: 34 %
Lymphs Abs: 1.7 10*3/uL (ref 0.7–4.0)
MCH: 26.2 pg (ref 26.0–34.0)
MCHC: 30.4 g/dL (ref 30.0–36.0)
MCV: 86.4 fL (ref 80.0–100.0)
Monocytes Absolute: 0.4 10*3/uL (ref 0.1–1.0)
Monocytes Relative: 7 %
Neutro Abs: 2.6 10*3/uL (ref 1.7–7.7)
Neutrophils Relative %: 53 %
Platelets: 215 10*3/uL (ref 150–400)
RBC: 3.89 MIL/uL (ref 3.87–5.11)
RDW: 13.5 % (ref 11.5–15.5)
WBC: 5 10*3/uL (ref 4.0–10.5)
nRBC: 0 % (ref 0.0–0.2)

## 2020-01-29 LAB — FERRITIN: Ferritin: 66 ng/mL (ref 11–307)

## 2020-01-29 LAB — IRON AND TIBC
Iron: 68 ug/dL (ref 28–170)
Saturation Ratios: 22 % (ref 10.4–31.8)
TIBC: 308 ug/dL (ref 250–450)
UIBC: 240 ug/dL

## 2020-01-29 LAB — VITAMIN D 25 HYDROXY (VIT D DEFICIENCY, FRACTURES): Vit D, 25-Hydroxy: 41.4 ng/mL (ref 30–100)

## 2020-01-29 LAB — VITAMIN B12: Vitamin B-12: 370 pg/mL (ref 180–914)

## 2020-01-29 LAB — FOLATE: Folate: 39 ng/mL (ref >5.9)

## 2020-01-29 LAB — LACTATE DEHYDROGENASE: LDH: 133 U/L (ref 98–192)

## 2020-01-30 LAB — PROTEIN ELECTROPHORESIS, SERUM
A/G Ratio: 0.8 (ref 0.7–1.7)
Albumin ELP: 3.8 g/dL (ref 2.9–4.4)
Alpha-1-Globulin: 0.3 g/dL (ref 0.0–0.4)
Alpha-2-Globulin: 1 g/dL (ref 0.4–1.0)
Beta Globulin: 1.1 g/dL (ref 0.7–1.3)
Gamma Globulin: 2.6 g/dL — ABNORMAL HIGH (ref 0.4–1.8)
Globulin, Total: 4.9 g/dL — ABNORMAL HIGH (ref 2.2–3.9)
M-Spike, %: 2.3 g/dL — ABNORMAL HIGH
Total Protein ELP: 8.7 g/dL — ABNORMAL HIGH (ref 6.0–8.5)

## 2020-01-30 LAB — IGG, IGA, IGM
IgA: 72 mg/dL (ref 64–422)
IgG (Immunoglobin G), Serum: 3174 mg/dL — ABNORMAL HIGH (ref 586–1602)
IgM (Immunoglobulin M), Srm: 50 mg/dL (ref 26–217)

## 2020-01-30 LAB — KAPPA/LAMBDA LIGHT CHAINS
Kappa free light chain: 308.6 mg/L — ABNORMAL HIGH (ref 3.3–19.4)
Kappa, lambda light chain ratio: 14.49 — ABNORMAL HIGH (ref 0.26–1.65)
Lambda free light chains: 21.3 mg/L (ref 5.7–26.3)

## 2020-01-30 LAB — IGA: IgA: 75 mg/dL (ref 64–422)

## 2020-01-30 LAB — IGM: IgM (Immunoglobulin M), Srm: 45 mg/dL (ref 26–217)

## 2020-01-30 LAB — IGG: IgG (Immunoglobin G), Serum: 3337 mg/dL — ABNORMAL HIGH (ref 586–1602)

## 2020-01-30 LAB — IMMUNOFIXATION ELECTROPHORESIS
IgA: 69 mg/dL (ref 64–422)
IgG (Immunoglobin G), Serum: 3003 mg/dL — ABNORMAL HIGH (ref 586–1602)
IgM (Immunoglobulin M), Srm: 50 mg/dL (ref 26–217)
Total Protein ELP: 8.4 g/dL (ref 6.0–8.5)

## 2020-02-05 ENCOUNTER — Inpatient Hospital Stay (HOSPITAL_BASED_OUTPATIENT_CLINIC_OR_DEPARTMENT_OTHER): Payer: Medicare Other | Admitting: Hematology

## 2020-02-05 ENCOUNTER — Other Ambulatory Visit: Payer: Self-pay

## 2020-02-05 VITALS — BP 134/66 | HR 98 | Temp 97.1°F | Resp 18 | Wt 199.6 lb

## 2020-02-05 DIAGNOSIS — E1122 Type 2 diabetes mellitus with diabetic chronic kidney disease: Secondary | ICD-10-CM | POA: Diagnosis not present

## 2020-02-05 DIAGNOSIS — D472 Monoclonal gammopathy: Secondary | ICD-10-CM

## 2020-02-05 DIAGNOSIS — I129 Hypertensive chronic kidney disease with stage 1 through stage 4 chronic kidney disease, or unspecified chronic kidney disease: Secondary | ICD-10-CM | POA: Diagnosis not present

## 2020-02-05 DIAGNOSIS — M2550 Pain in unspecified joint: Secondary | ICD-10-CM | POA: Diagnosis not present

## 2020-02-05 DIAGNOSIS — Z809 Family history of malignant neoplasm, unspecified: Secondary | ICD-10-CM | POA: Diagnosis not present

## 2020-02-05 DIAGNOSIS — D649 Anemia, unspecified: Secondary | ICD-10-CM | POA: Diagnosis not present

## 2020-02-05 DIAGNOSIS — N183 Chronic kidney disease, stage 3 unspecified: Secondary | ICD-10-CM | POA: Diagnosis not present

## 2020-02-05 DIAGNOSIS — Z8249 Family history of ischemic heart disease and other diseases of the circulatory system: Secondary | ICD-10-CM | POA: Diagnosis not present

## 2020-02-05 DIAGNOSIS — Z841 Family history of disorders of kidney and ureter: Secondary | ICD-10-CM | POA: Diagnosis not present

## 2020-02-05 DIAGNOSIS — Z832 Family history of diseases of the blood and blood-forming organs and certain disorders involving the immune mechanism: Secondary | ICD-10-CM | POA: Diagnosis not present

## 2020-02-05 DIAGNOSIS — D631 Anemia in chronic kidney disease: Secondary | ICD-10-CM | POA: Diagnosis not present

## 2020-02-05 DIAGNOSIS — N1832 Chronic kidney disease, stage 3b: Secondary | ICD-10-CM

## 2020-02-05 DIAGNOSIS — Z79899 Other long term (current) drug therapy: Secondary | ICD-10-CM | POA: Diagnosis not present

## 2020-02-05 DIAGNOSIS — R5383 Other fatigue: Secondary | ICD-10-CM | POA: Diagnosis not present

## 2020-02-05 DIAGNOSIS — E785 Hyperlipidemia, unspecified: Secondary | ICD-10-CM | POA: Diagnosis not present

## 2020-02-05 DIAGNOSIS — Z803 Family history of malignant neoplasm of breast: Secondary | ICD-10-CM | POA: Diagnosis not present

## 2020-02-05 DIAGNOSIS — E039 Hypothyroidism, unspecified: Secondary | ICD-10-CM | POA: Diagnosis not present

## 2020-02-05 DIAGNOSIS — N3289 Other specified disorders of bladder: Secondary | ICD-10-CM | POA: Diagnosis not present

## 2020-02-05 DIAGNOSIS — N281 Cyst of kidney, acquired: Secondary | ICD-10-CM | POA: Diagnosis not present

## 2020-02-05 NOTE — Progress Notes (Signed)
Renee Collins, Renee Collins [id number]   CLINIC:  Medical Oncology/Hematology  PCP:  Default, Provider, MD None  None  REASON FOR VISIT:  Follow-up for MGUS and normocytic anemia  PRIOR THERAPY: None  CURRENT THERAPY: Observation  INTERVAL HISTORY:  Renee Collins, a 79 y.o. female, returns for routine follow-up for her MGUS and normocytic anemia. Renee Collins was last seen on 02/13/2019.  Today she reports no changes in her overall health. She denies melena, hematochezia, hematuria, or bone pains except for arthritis. She denies any recent infections. She reports feeling more fatigued; she has received an iron infusion in the past, in 2015.   REVIEW OF SYSTEMS:  Review of Systems  Constitutional: Positive for appetite change (mildly decreased) and fatigue (moderate). Negative for chills and fever.  Gastrointestinal: Negative for blood in stool.  Genitourinary: Negative for hematuria.   Musculoskeletal: Positive for arthralgias (arthritis in multiple joints). Negative for myalgias.  All other systems reviewed and are negative.   PAST MEDICAL/SURGICAL HISTORY:  Past Medical History:  Diagnosis Date  . Anemia   . Chronic back pain   . Depression   . Diabetes mellitus   . GERD (gastroesophageal reflux disease)   . Hyperlipidemia   . Hypertension   . Hypothyroidism    Past Surgical History:  Procedure Laterality Date  . BONE MARROW ASPIRATION Left 05/16/15  . BONE MARROW BIOPSY Left 05/16/15  . COLONOSCOPY  03/30/2012   Procedure: COLONOSCOPY;  Surgeon: Rogene Houston, MD;  [facility];  Service: Endoscopy;  Laterality: N/A;  730  . COLONOSCOPY N/A 06/30/2017   Procedure: COLONOSCOPY;  Surgeon: Rogene Houston, MD;  [facility]: [facility];  Service: Endoscopy;  Laterality: N/A;  2:00  . LAMINECTOMY  1980's  . POLYPECTOMY  06/30/2017   Procedure: POLYPECTOMY;  Surgeon: Rogene Houston, MD;  [facility]: [facility];  Service:  Endoscopy;;  colon  . SPINE SURGERY  approx 1983   dr Joya Salm  . TOTAL ABDOMINAL HYSTERECTOMY  approx 1993   fibroids     SOCIAL HISTORY:  Social History   Socioeconomic History  . Marital status: Divorced    Spouse name: Not on file  . Number of children: 3  . Years of education: Not on file  . Highest education level: Not on file  Occupational History  . Occupation: LPN  Tobacco Use  . Smoking status: Never Smoker  . Smokeless tobacco: Never Used  Substance and Sexual Activity  . Alcohol use: No  . Drug use: No  . Sexual activity: Not Currently  Other Topics Concern  . Not on file  Social History Narrative   Pt has 2 living adult children, 1 desease at age 13 secondary congential heart disease    Social Determinants of Health   Financial Resource Strain:   . Difficulty of Paying Living Expenses:   Food Insecurity:   . Worried About Charity fundraiser in the Last Year:   . Arboriculturist in the Last Year:   Transportation Needs:   . Film/video editor (Medical):   Marland Kitchen Lack of Transportation (Non-Medical):   Physical Activity:   . Days of Exercise per Week:   . Minutes of Exercise per Session:   Stress:   . Feeling of Stress :   Social Connections:   . Frequency of Communication with Friends and Family:   . Frequency of Social Gatherings with Friends and Family:   .  Attends Religious Services:   . Active Member of Clubs or Organizations:   . Attends Archivist Meetings:   Marland Kitchen Marital Status:   Intimate Partner Violence:   . Fear of Current or Ex-Partner:   . Emotionally Abused:   Marland Kitchen Physically Abused:   . Sexually Abused:     FAMILY HISTORY:  Family History  Problem Relation Age of Onset  . Heart attack Mother 37  . Heart disease Mother   . Cancer Mother        type unknown  . Diabetes Mother   . Kidney failure Father   . Kidney disease Father   . Myasthenia gravis Brother   . Diabetes Sister   . Breast cancer Sister 76  . Heart  disease Brother   . Diabetes Sister   . Sickle cell anemia Sister   . Cancer Sister        unknown type    CURRENT MEDICATIONS:  Current Outpatient Medications  Medication Sig Dispense Refill  . amLODipine (NORVASC) 10 MG tablet TAKE 1 TABLET BY MOUTH  DAILY 90 tablet 3  . aspirin (ASPIRIN ADULT LOW STRENGTH) 81 MG EC tablet Take 1 tablet (81 mg total) by mouth daily. 30 tablet 12  . benazepril (LOTENSIN) 20 MG tablet TAKE 1 TABLET BY MOUTH  DAILY 90 tablet 3  . buPROPion (WELLBUTRIN XL) 150 MG 24 hr tablet Take 1 tablet (150 mg total) by mouth daily. 30 tablet 3  . Calcium-Magnesium-Vitamin D (CALCIUM 1200+D3 PO) Take 1 tablet by mouth 2 (two) times daily.    . diphenhydrAMINE (SOMINEX) 25 MG tablet Take by mouth.    . docusate sodium (COLACE) 50 MG capsule Take 50 mg by mouth daily.    Marland Kitchen gabapentin (NEURONTIN) 100 MG capsule TAKE 1 TO 2 CAPSULES BY  MOUTH 3 TIMES DAILY 540 capsule 3  . hydrochlorothiazide (HYDRODIURIL) 25 MG tablet TAKE 1 TABLET BY MOUTH  DAILY 90 tablet 3  . levothyroxine (SYNTHROID) 100 MCG tablet TAKE (1) TABLET BY MOUTH ONCE DAILY. TAKE 30 MINUTES BEFORE FIRST MEAL OF THE DAY. DRINKWATER. 90 tablet 0  . Multiple Vitamin (MULTIVITAMIN WITH MINERALS) TABS tablet Take 1 tablet by mouth daily.    Marland Kitchen omeprazole (PRILOSEC) 20 MG capsule Take 20 mg by mouth daily as needed (for acid reflux.).     Marland Kitchen polyethylene glycol powder (GLYCOLAX/MIRALAX) 17 GM/SCOOP powder Take by mouth.    . pravastatin (PRAVACHOL) 20 MG tablet TAKE 1 TABLET BY MOUTH IN  THE EVENING 90 tablet 3  . acetaminophen (TYLENOL 8 HOUR ARTHRITIS PAIN) 650 MG CR tablet Take 650 mg by mouth every 8 (eight) hours as needed for pain.  (Patient not taking: Reported on 02/05/2020)    . diphenhydrAMINE (BENADRYL) 25 mg capsule Take 25 mg by mouth at bedtime as needed (for allergies/sleep.).  (Patient not taking: Reported on 02/05/2020)     No current facility-administered medications for this visit.    ALLERGIES:    No Known Allergies  PHYSICAL EXAM:  Performance status (ECOG): 1 - Symptomatic but completely ambulatory  Vitals:   02/05/20 1113  BP: 134/66  Pulse: 98  Resp: 18  Temp: (!) 97.1 F (36.2 C)  SpO2: 100%   Wt Readings from Last 3 Encounters:  02/05/20 199 lb 9.6 oz (90.5 kg)  11/27/19 200 lb 1.9 oz (90.8 kg)  08/22/19 203 lb (92.1 kg)   Physical Exam Vitals reviewed.  Constitutional:      Appearance: Normal appearance. She is  obese.  Cardiovascular:     Rate and Rhythm: Normal rate and regular rhythm.     Pulses: Normal pulses.     Heart sounds: Normal heart sounds.  Pulmonary:     Effort: Pulmonary effort is normal.     Breath sounds: Normal breath sounds.  Abdominal:     Palpations: Abdomen is soft. There is no mass.     Tenderness: There is no abdominal tenderness. There is no right CVA tenderness or left CVA tenderness.     Hernia: No hernia is present.  Musculoskeletal:     Thoracic back: No bony tenderness.     Lumbar back: No bony tenderness.     Right lower leg: No edema.     Left lower leg: No edema.  Lymphadenopathy:     Upper Body:     Right upper body: No supraclavicular or axillary adenopathy.     Left upper body: No supraclavicular or axillary adenopathy.     Lower Body: No right inguinal adenopathy. No left inguinal adenopathy.  Neurological:     General: No focal deficit present.     Mental Status: She is alert and oriented to person, place, and time.  Psychiatric:        Mood and Affect: Mood normal.        Behavior: Behavior normal.     LABORATORY DATA:  I have reviewed the labs as listed.  CBC Latest Ref Rng & Units 01/29/2020 12/18/2019 08/07/2019  WBC 4.0 - 10.5 K/uL 5.0 4.9 4.5  Hemoglobin 12.0 - 15.0 g/dL 10.2(L) 10.7(L) 10.8(L)  Hematocrit 36 - 46 % 33.6(L) 34.3(L) 35.8(L)  Platelets 150 - 400 K/uL 215 223 215   CMP Latest Ref Rng & Units 01/29/2020 12/18/2019 11/30/2019  Glucose 70 - 99 mg/dL 134(H) 117(H) 99  BUN 8 - 23 mg/dL 31(H) 34(H)  25  Creatinine 0.44 - 1.00 mg/dL 1.93(H) 2.05(H) 1.58(H)  Sodium 135 - 145 mmol/L 136 136 137  Potassium 3.5 - 5.1 mmol/L 3.6 3.7 4.4  Chloride 98 - 111 mmol/L 110 105 107  CO2 22 - 32 mmol/L 18(L) 21(L) 23  Calcium 8.9 - 10.3 mg/dL 9.1 9.7 9.6  Total Protein 6.5 - 8.1 g/dL 9.3(H) 9.4(H) 9.0(H)  Total Bilirubin 0.3 - 1.2 mg/dL 0.4 0.5 0.4  Alkaline Phos 38 - 126 U/L 68 63 -  AST 15 - 41 U/L '26 24 23  '$ ALT 0 - 44 U/L '22 21 18      '$ Component Value Date/Time   RBC 3.89 01/29/2020 1035   MCV 86.4 01/29/2020 1035   MCH 26.2 01/29/2020 1035   MCHC 30.4 01/29/2020 1035   RDW 13.5 01/29/2020 1035   LYMPHSABS 1.7 01/29/2020 1035   MONOABS 0.4 01/29/2020 1035   EOSABS 0.2 01/29/2020 1035   BASOSABS 0.0 01/29/2020 1035   Lab Results  Component Value Date   IGGSERUM 3,003 (H) 01/29/2020   IGGSERUM 3,174 (H) 01/29/2020   IGGSERUM 3,337 (H) 01/29/2020   IGA 69 01/29/2020   IGA 72 01/29/2020   IGA 75 01/29/2020   IGMSERUM 50 01/29/2020   IGMSERUM 50 01/29/2020   IGMSERUM 45 01/29/2020   Lab Results  Component Value Date   VD25OH 41.40 01/29/2020   VD25OH 41.92 12/18/2019   VD25OH 36.6 02/20/2019   Lab Results  Component Value Date   TIBC 308 01/29/2020   TIBC 322 12/18/2019   TIBC 335 08/07/2019   FERRITIN 66 01/29/2020   FERRITIN 81 12/18/2019   FERRITIN 96 08/07/2019  IRONPCTSAT 22 01/29/2020   IRONPCTSAT 16 12/18/2019   IRONPCTSAT 26 08/07/2019   Lab Results  Component Value Date   TOTALPROTELP 8.7 (H) 01/29/2020   TOTALPROTELP 8.4 01/29/2020   ALBUMINELP 3.8 01/29/2020   A1GS 0.3 01/29/2020   A2GS 1.0 01/29/2020   BETS 1.1 01/29/2020   BETA2SER 3.0 (L) 08/13/2013   GAMS 2.6 (H) 01/29/2020   MSPIKE 2.3 (H) 01/29/2020   SPEI Comment 01/29/2020    Lab Results  Component Value Date   KPAFRELGTCHN 308.6 (H) 01/29/2020   LAMBDASER 21.3 01/29/2020   KAPLAMBRATIO 14.49 (H) 01/29/2020    DIAGNOSTIC IMAGING:  I have independently reviewed the scans and  discussed with the patient. US RENAL  Result Date: 01/24/2020 CLINICAL DATA:  Persistent proteinuria, type II diabetes mellitus EXAM: RENAL / URINARY TRACT ULTRASOUND COMPLETE COMPARISON:  None FINDINGS: Right Kidney: Renal measurements: 10.3 x 4.2 x 4.3 cm = volume: 95 mL. Cortical thinning. Normal cortical echogenicity. Small cyst at upper pole 15 x 12 x 17 mm. No additional mass or hydronephrosis. Left Kidney: Renal measurements: 10.1 x 4.1 x 3.9 cm = volume: 84 mL. Less well visualized than RIGHT kidney due to body habitus and poor acoustic window. No gross evidence of mass or hydronephrosis. Bladder: Appears normal for degree of bladder distention. Other: N/A IMPRESSION: Small RIGHT renal cyst. No additional renal sonographic abnormalities identified. Electronically Signed   By: Lavonia Dana M.D.   On: 01/24/2020 16:43     ASSESSMENT:  1.  IgG kappa MGUS: - Bone marrow biopsy on 05/16/2015 shows limited bone marrow sample with no increase in plasma cells in the very limited material present.  Cytogenetics-41, XX.  FISH panel was normal. - Bone survey on 02/22/2017 showed ill-defined calcified lesion in the distal right femur.  MRI of the right femur on 03/02/2017 showed no osseous lesion. -Skeletal survey on 02/06/2019 did not show any lytic lesions. -Labs on 01/29/2020 shows M spike increased to 2.3 g.  Kappa light chains increased to 308 and ratio 14.49.  Immunofixation was IgG kappa.  2.  Normocytic anemia: -This is from chronic kidney disease.  Hemoglobin is 10.8. -Ferritin was 106 and percent saturation is 26.  J88 and folic acid were normal. -Denies any bleeding per rectum or melena.  We will plan to repeat her labs along with ferritin and iron panel prior to next visit.  3.  CKD: - Creatinine has been stable between 1.4 and 1.5. -Renal ultrasound on 01/24/2020 shows small right renal cyst with no additional findings.   PLAN:  1.  IgG kappa MGUS: -As her M spike and light chains have  gotten worse, I have recommended PET CT scan. -I will see her back after the PET scan.  We will decide if she needs a bone marrow biopsy again.  2.  Normocytic anemia: -Her hemoglobin dropped to 10.2.  Ferritin was 66 and saturation was 22. -I have recommended weekly Feraheme x2.  3.  CKD: -This has slightly gotten worse since June of this year.   Orders placed this encounter:  No orders of the defined types were placed in this encounter.    Derek Jack, MD [place] [phone]   I, Milinda Antis, am acting as a scribe for Dr. Sanda Linger.  I, Derek Jack MD, have reviewed the above documentation for accuracy and completeness, and I agree with the above.

## 2020-02-05 NOTE — Patient Instructions (Signed)
[place] at [facility] Discharge Instructions  You were seen today by Dr. Delton Coombes. He went over your recent results. You will be scheduled for iron infusions and a PET scan before your next visit. Dr. Delton Coombes will see you back after your PET scan for follow up.   Thank you for choosing [place] at [facility] to provide your oncology and hematology care.  To afford each patient quality time with our provider, please arrive at least 15 minutes before your scheduled appointment time.   If you have a lab appointment with the [facility] please come in thru the Main Entrance and check in at the main information desk  You need to re-schedule your appointment should you arrive 10 or more minutes late.  We strive to give you quality time with our providers, and arriving late affects you and other patients whose appointments are after yours.  Also, if you no show three or more times for appointments you may be dismissed from the clinic at the providers discretion.     Again, thank you for choosing [facility].  Our hope is that these requests will decrease the amount of time that you wait before being seen by our physicians.       _____________________________________________________________  Should you have questions after your visit to [facility], please contact our office at [phone] between the hours of 8:00 a.m. and 4:30 p.m.  Voicemails left after 4:00 p.m. will not be returned until the following business day.  For prescription refill requests, have your pharmacy contact our office and allow 72 hours.    Cancer Center Support Programs:   > Cancer Support Group  2nd Tuesday of the month 1pm-2pm, Journey Room

## 2020-02-08 ENCOUNTER — Other Ambulatory Visit: Payer: Self-pay

## 2020-02-08 ENCOUNTER — Encounter (HOSPITAL_COMMUNITY): Payer: Self-pay

## 2020-02-08 ENCOUNTER — Inpatient Hospital Stay (HOSPITAL_COMMUNITY): Payer: Medicare Other

## 2020-02-08 VITALS — BP 127/69 | HR 73 | Temp 97.5°F | Resp 18

## 2020-02-08 DIAGNOSIS — Z8249 Family history of ischemic heart disease and other diseases of the circulatory system: Secondary | ICD-10-CM | POA: Diagnosis not present

## 2020-02-08 DIAGNOSIS — M2550 Pain in unspecified joint: Secondary | ICD-10-CM | POA: Diagnosis not present

## 2020-02-08 DIAGNOSIS — Z832 Family history of diseases of the blood and blood-forming organs and certain disorders involving the immune mechanism: Secondary | ICD-10-CM | POA: Diagnosis not present

## 2020-02-08 DIAGNOSIS — Z803 Family history of malignant neoplasm of breast: Secondary | ICD-10-CM | POA: Diagnosis not present

## 2020-02-08 DIAGNOSIS — Z809 Family history of malignant neoplasm, unspecified: Secondary | ICD-10-CM | POA: Diagnosis not present

## 2020-02-08 DIAGNOSIS — Z79899 Other long term (current) drug therapy: Secondary | ICD-10-CM | POA: Diagnosis not present

## 2020-02-08 DIAGNOSIS — E1122 Type 2 diabetes mellitus with diabetic chronic kidney disease: Secondary | ICD-10-CM | POA: Diagnosis not present

## 2020-02-08 DIAGNOSIS — N183 Chronic kidney disease, stage 3 unspecified: Secondary | ICD-10-CM | POA: Diagnosis not present

## 2020-02-08 DIAGNOSIS — R5383 Other fatigue: Secondary | ICD-10-CM | POA: Diagnosis not present

## 2020-02-08 DIAGNOSIS — E785 Hyperlipidemia, unspecified: Secondary | ICD-10-CM | POA: Diagnosis not present

## 2020-02-08 DIAGNOSIS — Z841 Family history of disorders of kidney and ureter: Secondary | ICD-10-CM | POA: Diagnosis not present

## 2020-02-08 DIAGNOSIS — I129 Hypertensive chronic kidney disease with stage 1 through stage 4 chronic kidney disease, or unspecified chronic kidney disease: Secondary | ICD-10-CM | POA: Diagnosis not present

## 2020-02-08 DIAGNOSIS — D500 Iron deficiency anemia secondary to blood loss (chronic): Secondary | ICD-10-CM

## 2020-02-08 DIAGNOSIS — N3289 Other specified disorders of bladder: Secondary | ICD-10-CM | POA: Diagnosis not present

## 2020-02-08 DIAGNOSIS — D649 Anemia, unspecified: Secondary | ICD-10-CM | POA: Diagnosis not present

## 2020-02-08 DIAGNOSIS — E039 Hypothyroidism, unspecified: Secondary | ICD-10-CM | POA: Diagnosis not present

## 2020-02-08 DIAGNOSIS — D472 Monoclonal gammopathy: Secondary | ICD-10-CM | POA: Diagnosis not present

## 2020-02-08 DIAGNOSIS — N281 Cyst of kidney, acquired: Secondary | ICD-10-CM | POA: Diagnosis not present

## 2020-02-08 MED ORDER — LORATADINE 10 MG PO TABS
10.0000 mg | ORAL_TABLET | Freq: Once | ORAL | Status: AC
  Administered 2020-02-08: 10 mg via ORAL
  Filled 2020-02-08: qty 1

## 2020-02-08 MED ORDER — ACETAMINOPHEN 325 MG PO TABS
650.0000 mg | ORAL_TABLET | Freq: Once | ORAL | Status: AC
  Administered 2020-02-08: 650 mg via ORAL
  Filled 2020-02-08: qty 2

## 2020-02-08 MED ORDER — FAMOTIDINE 20 MG PO TABS
20.0000 mg | ORAL_TABLET | Freq: Once | ORAL | Status: AC
  Administered 2020-02-08: 20 mg via ORAL
  Filled 2020-02-08: qty 1

## 2020-02-08 MED ORDER — SODIUM CHLORIDE 0.9 % IV SOLN: Freq: Once | INTRAVENOUS | Status: AC

## 2020-02-08 MED ORDER — SODIUM CHLORIDE 0.9 % IV SOLN
510.0000 mg | Freq: Once | INTRAVENOUS | Status: AC
  Administered 2020-02-08: 510 mg via INTRAVENOUS
  Filled 2020-02-08: qty 510

## 2020-02-08 NOTE — Patient Instructions (Signed)
[facility] at [facility] Discharge Instructions  Received Feraheme infusion today. Follow-up as scheduled   Thank you for choosing [facility] at [facility] to provide your oncology and hematology care.  To afford each patient quality time with our provider, please arrive at least 15 minutes before your scheduled appointment time.   If you have a lab appointment with the Cancer Center please come in thru the Main Entrance and check in at the main information desk.  You need to re-schedule your appointment should you arrive 10 or more minutes late.  We strive to give you quality time with our providers, and arriving late affects you and other patients whose appointments are after yours.  Also, if you no show three or more times for appointments you may be dismissed from the clinic at the providers discretion.     Again, thank you for choosing [facility].  Our hope is that these requests will decrease the amount of time that you wait before being seen by our physicians.       _____________________________________________________________  Should you have questions after your visit to [facility], please contact our office at [phone] and follow the prompts.  Our office hours are 8:00 a.m. and 4:30 p.m. Monday - Friday.  Please note that voicemails left after 4:00 p.m. may not be returned until the following business day.  We are closed weekends and major holidays.  You do have access to a nurse 24-7, just call the main number to the clinic [phone] and do not press any options, hold on the line and a nurse will answer the phone.    For prescription refill requests, have your pharmacy contact our office and allow 72 hours.    Due to Covid, you will need to wear a mask upon entering the hospital. If you do not have a mask, a mask will be given to you at the Main Entrance upon arrival. For doctor visits, patients may have  1 support person age 18 or older with them. For treatment visits, patients can not have anyone with them due to social distancing guidelines and our immunocompromised population.     

## 2020-02-08 NOTE — Progress Notes (Signed)
Sarina Ill tolerated Feraheme infusion well without complaints or incident. Peripheral IV site checked with positive blood return noted prior to and after infusion. VSS upon discharge. Pt discharged self ambulatory in satisfactory condition

## 2020-02-15 ENCOUNTER — Inpatient Hospital Stay (HOSPITAL_COMMUNITY): Payer: Medicare Other

## 2020-02-15 ENCOUNTER — Other Ambulatory Visit: Payer: Self-pay

## 2020-02-15 VITALS — BP 125/56 | HR 67 | Temp 97.0°F | Resp 18

## 2020-02-15 DIAGNOSIS — D649 Anemia, unspecified: Secondary | ICD-10-CM | POA: Diagnosis not present

## 2020-02-15 DIAGNOSIS — I129 Hypertensive chronic kidney disease with stage 1 through stage 4 chronic kidney disease, or unspecified chronic kidney disease: Secondary | ICD-10-CM | POA: Diagnosis not present

## 2020-02-15 DIAGNOSIS — N3289 Other specified disorders of bladder: Secondary | ICD-10-CM | POA: Diagnosis not present

## 2020-02-15 DIAGNOSIS — N281 Cyst of kidney, acquired: Secondary | ICD-10-CM | POA: Diagnosis not present

## 2020-02-15 DIAGNOSIS — Z841 Family history of disorders of kidney and ureter: Secondary | ICD-10-CM | POA: Diagnosis not present

## 2020-02-15 DIAGNOSIS — Z8249 Family history of ischemic heart disease and other diseases of the circulatory system: Secondary | ICD-10-CM | POA: Diagnosis not present

## 2020-02-15 DIAGNOSIS — R5383 Other fatigue: Secondary | ICD-10-CM | POA: Diagnosis not present

## 2020-02-15 DIAGNOSIS — D500 Iron deficiency anemia secondary to blood loss (chronic): Secondary | ICD-10-CM

## 2020-02-15 DIAGNOSIS — E1122 Type 2 diabetes mellitus with diabetic chronic kidney disease: Secondary | ICD-10-CM | POA: Diagnosis not present

## 2020-02-15 DIAGNOSIS — M2550 Pain in unspecified joint: Secondary | ICD-10-CM | POA: Diagnosis not present

## 2020-02-15 DIAGNOSIS — Z803 Family history of malignant neoplasm of breast: Secondary | ICD-10-CM | POA: Diagnosis not present

## 2020-02-15 DIAGNOSIS — D472 Monoclonal gammopathy: Secondary | ICD-10-CM | POA: Diagnosis not present

## 2020-02-15 DIAGNOSIS — E785 Hyperlipidemia, unspecified: Secondary | ICD-10-CM | POA: Diagnosis not present

## 2020-02-15 DIAGNOSIS — E039 Hypothyroidism, unspecified: Secondary | ICD-10-CM | POA: Diagnosis not present

## 2020-02-15 DIAGNOSIS — N183 Chronic kidney disease, stage 3 unspecified: Secondary | ICD-10-CM | POA: Diagnosis not present

## 2020-02-15 DIAGNOSIS — Z79899 Other long term (current) drug therapy: Secondary | ICD-10-CM | POA: Diagnosis not present

## 2020-02-15 DIAGNOSIS — Z809 Family history of malignant neoplasm, unspecified: Secondary | ICD-10-CM | POA: Diagnosis not present

## 2020-02-15 DIAGNOSIS — Z832 Family history of diseases of the blood and blood-forming organs and certain disorders involving the immune mechanism: Secondary | ICD-10-CM | POA: Diagnosis not present

## 2020-02-15 MED ORDER — SODIUM CHLORIDE 0.9 % IV SOLN: Freq: Once | INTRAVENOUS | Status: AC

## 2020-02-15 MED ORDER — ACETAMINOPHEN 325 MG PO TABS
650.0000 mg | ORAL_TABLET | Freq: Once | ORAL | Status: AC
  Administered 2020-02-15: 650 mg via ORAL

## 2020-02-15 MED ORDER — LORATADINE 10 MG PO TABS
10.0000 mg | ORAL_TABLET | Freq: Once | ORAL | Status: AC
  Administered 2020-02-15: 10 mg via ORAL

## 2020-02-15 MED ORDER — ACETAMINOPHEN 325 MG PO TABS
ORAL_TABLET | ORAL | Status: AC
  Filled 2020-02-15: qty 2

## 2020-02-15 MED ORDER — SODIUM CHLORIDE 0.9 % IV SOLN
510.0000 mg | Freq: Once | INTRAVENOUS | Status: AC
  Administered 2020-02-15: 510 mg via INTRAVENOUS
  Filled 2020-02-15: qty 510

## 2020-02-15 MED ORDER — FAMOTIDINE 20 MG PO TABS
ORAL_TABLET | ORAL | Status: AC
  Filled 2020-02-15: qty 1

## 2020-02-15 MED ORDER — FAMOTIDINE 20 MG PO TABS
20.0000 mg | ORAL_TABLET | Freq: Once | ORAL | Status: AC
  Administered 2020-02-15: 20 mg via ORAL

## 2020-02-15 MED ORDER — LORATADINE 10 MG PO TABS
ORAL_TABLET | ORAL | Status: AC
  Filled 2020-02-15: qty 1

## 2020-02-15 NOTE — Patient Instructions (Signed)
[facility] at [facility]  Discharge Instructions:   _______________________________________________________________  Thank you for choosing [facility] at [facility] to provide your oncology and hematology care.  To afford each patient quality time with our providers, please arrive at least 15 minutes before your scheduled appointment.  You need to re-schedule your appointment if you arrive 10 or more minutes late.  We strive to give you quality time with our providers, and arriving late affects you and other patients whose appointments are after yours.  Also, if you no show three or more times for appointments you may be dismissed from the clinic.  Again, thank you for choosing [facility] at [facility]. Our hope is that these requests will allow you access to exceptional care and in a timely manner. _______________________________________________________________  If you have questions after your visit, please contact our office at [phone] between the hours of 8:30 a.m. and 5:00 p.m. Voicemails left after 4:30 p.m. will not be returned until the following business day. _______________________________________________________________  For prescription refill requests, have your pharmacy contact our office. _______________________________________________________________  Recommendations made by the consultant and any test results will be sent to your referring physician. _______________________________________________________________ 

## 2020-02-15 NOTE — Progress Notes (Signed)
Iron infusion given per orders. Patient tolerated it well without problems. Vitals stable and discharged home from clinic ambulatory. Follow up as scheduled.  

## 2020-02-22 DIAGNOSIS — E211 Secondary hyperparathyroidism, not elsewhere classified: Secondary | ICD-10-CM | POA: Diagnosis not present

## 2020-02-22 DIAGNOSIS — D638 Anemia in other chronic diseases classified elsewhere: Secondary | ICD-10-CM | POA: Diagnosis not present

## 2020-02-22 DIAGNOSIS — R809 Proteinuria, unspecified: Secondary | ICD-10-CM | POA: Diagnosis not present

## 2020-02-22 DIAGNOSIS — N189 Chronic kidney disease, unspecified: Secondary | ICD-10-CM | POA: Diagnosis not present

## 2020-02-22 DIAGNOSIS — N170 Acute kidney failure with tubular necrosis: Secondary | ICD-10-CM | POA: Diagnosis not present

## 2020-02-25 ENCOUNTER — Other Ambulatory Visit: Payer: Self-pay

## 2020-02-25 ENCOUNTER — Encounter (HOSPITAL_COMMUNITY)
Admission: RE | Admit: 2020-02-25 | Discharge: 2020-02-25 | Disposition: A | Discharge status: Home / Self Care | Payer: Medicare Other | Source: Ambulatory Visit | Attending: Hematology | Admitting: Hematology

## 2020-02-25 DIAGNOSIS — K449 Diaphragmatic hernia without obstruction or gangrene: Secondary | ICD-10-CM | POA: Diagnosis not present

## 2020-02-25 DIAGNOSIS — J9811 Atelectasis: Secondary | ICD-10-CM | POA: Diagnosis not present

## 2020-02-25 DIAGNOSIS — I700 Atherosclerosis of aorta: Secondary | ICD-10-CM | POA: Diagnosis not present

## 2020-02-25 DIAGNOSIS — K5730 Diverticulosis of large intestine without perforation or abscess without bleeding: Secondary | ICD-10-CM | POA: Diagnosis not present

## 2020-02-25 DIAGNOSIS — D472 Monoclonal gammopathy: Secondary | ICD-10-CM

## 2020-02-25 MED ORDER — FLUDEOXYGLUCOSE F - 18 (FDG) INJECTION
11.3700 | Freq: Once | INTRAVENOUS | Status: AC | PRN
  Administered 2020-02-25: 11.37 via INTRAVENOUS

## 2020-02-27 ENCOUNTER — Ambulatory Visit (HOSPITAL_COMMUNITY): Payer: Medicare Other | Admitting: Hematology

## 2020-02-28 ENCOUNTER — Inpatient Hospital Stay (HOSPITAL_COMMUNITY): Payer: Medicare Other | Attending: Hematology | Admitting: Hematology

## 2020-02-28 ENCOUNTER — Other Ambulatory Visit: Payer: Self-pay

## 2020-02-28 VITALS — BP 128/71 | HR 88 | Temp 97.0°F | Resp 18 | Wt 197.0 lb

## 2020-02-28 DIAGNOSIS — I129 Hypertensive chronic kidney disease with stage 1 through stage 4 chronic kidney disease, or unspecified chronic kidney disease: Secondary | ICD-10-CM | POA: Diagnosis not present

## 2020-02-28 DIAGNOSIS — D631 Anemia in chronic kidney disease: Secondary | ICD-10-CM

## 2020-02-28 DIAGNOSIS — N189 Chronic kidney disease, unspecified: Secondary | ICD-10-CM | POA: Diagnosis not present

## 2020-02-28 DIAGNOSIS — Z79899 Other long term (current) drug therapy: Secondary | ICD-10-CM | POA: Insufficient documentation

## 2020-02-28 DIAGNOSIS — Z7982 Long term (current) use of aspirin: Secondary | ICD-10-CM | POA: Insufficient documentation

## 2020-02-28 DIAGNOSIS — K219 Gastro-esophageal reflux disease without esophagitis: Secondary | ICD-10-CM | POA: Insufficient documentation

## 2020-02-28 DIAGNOSIS — D472 Monoclonal gammopathy: Secondary | ICD-10-CM | POA: Insufficient documentation

## 2020-02-28 DIAGNOSIS — E785 Hyperlipidemia, unspecified: Secondary | ICD-10-CM | POA: Insufficient documentation

## 2020-02-28 DIAGNOSIS — N1832 Chronic kidney disease, stage 3b: Secondary | ICD-10-CM

## 2020-02-28 DIAGNOSIS — F329 Major depressive disorder, single episode, unspecified: Secondary | ICD-10-CM | POA: Diagnosis not present

## 2020-02-28 DIAGNOSIS — E1122 Type 2 diabetes mellitus with diabetic chronic kidney disease: Secondary | ICD-10-CM | POA: Insufficient documentation

## 2020-02-28 DIAGNOSIS — E039 Hypothyroidism, unspecified: Secondary | ICD-10-CM | POA: Insufficient documentation

## 2020-02-28 NOTE — Progress Notes (Signed)
Dr. Delton Coombes assessed patient prior to RN.

## 2020-02-28 NOTE — Patient Instructions (Addendum)
[facility] at [facility] Discharge Instructions  You were seen today by Dr. Katragadda. He went over your recent results and scans. Dr. Katragadda will see you back in 3 months for labs and follow up.   Thank you for choosing [facility] at [facility] to provide your oncology and hematology care.  To afford each patient quality time with our provider, please arrive at least 15 minutes before your scheduled appointment time.   If you have a lab appointment with the Cancer Center please come in thru the Main Entrance and check in at the main information desk  You need to re-schedule your appointment should you arrive 10 or more minutes late.  We strive to give you quality time with our providers, and arriving late affects you and other patients whose appointments are after yours.  Also, if you no show three or more times for appointments you may be dismissed from the clinic at the providers discretion.     Again, thank you for choosing [facility].  Our hope is that these requests will decrease the amount of time that you wait before being seen by our physicians.       _____________________________________________________________  Should you have questions after your visit to  Cancer Center, please contact our office at [phone] between the hours of 8:00 a.m. and 4:30 p.m.  Voicemails left after 4:00 p.m. will not be returned until the following business day.  For prescription refill requests, have your pharmacy contact our office and allow 72 hours.    Cancer Center Support Programs:   > Cancer Support Group  2nd Tuesday of the month 1pm-2pm, Journey Room    

## 2020-02-28 NOTE — Progress Notes (Signed)
[employer] [place], [place] [address]   CLINIC:  Medical Oncology/Hematology  PCP:  [name], MD [address], [address] / [place] [address]  [phone]  REASON FOR VISIT:  Follow-up for MGUS and normocytic anemia  PRIOR THERAPY: None  CURRENT THERAPY: Intermittent Feraheme  INTERVAL HISTORY:  [name], a 79 y.o. female, returns for routine follow-up for her MGUS and normocytic anemia. Renee Collins was last seen on 02/05/2020.  Today she reports feeling okay. She denies feeling any better after receiving the 2 iron infusions. She denies having melena, hematochezia or hematuria.   REVIEW OF SYSTEMS:  Review of Systems  Constitutional: Positive for appetite change and fatigue.  Gastrointestinal: Negative for blood in stool.  Genitourinary: Negative for hematuria.   All other systems reviewed and are negative.   PAST MEDICAL/SURGICAL HISTORY:  Past Medical History:  Diagnosis Date   Anemia    Chronic back pain    Depression    Diabetes mellitus    GERD (gastroesophageal reflux disease)    Hyperlipidemia    Hypertension    Hypothyroidism    Past Surgical History:  Procedure Laterality Date   BONE MARROW ASPIRATION Left 05/16/15   BONE MARROW BIOPSY Left 05/16/15   COLONOSCOPY  03/30/2012   Procedure: COLONOSCOPY;  Surgeon: Rogene Houston, MD;  Location: [facility];  Service: Endoscopy;  Laterality: N/A;  730   COLONOSCOPY N/A 06/30/2017   Procedure: COLONOSCOPY;  Surgeon: Rogene Houston, MD;  Location: [facility];  Service: Endoscopy;  Laterality: N/A;  2:00   LAMINECTOMY  1980's   POLYPECTOMY  06/30/2017   Procedure: POLYPECTOMY;  Surgeon: Rogene Houston, MD;  Location: [facility];  Service: Endoscopy;;  colon   SPINE SURGERY  approx 1983   dr Joya Salm   TOTAL ABDOMINAL HYSTERECTOMY  approx 1993   fibroids     SOCIAL HISTORY:  Social History   Socioeconomic History   Marital  status: Divorced    Spouse name: Not on file   Number of children: 3   Years of education: Not on file   Highest education level: Not on file  Occupational History   Occupation: LPN  Tobacco Use   Smoking status: Never Smoker   Smokeless tobacco: Never Used  Substance and Sexual Activity   Alcohol use: No   Drug use: No   Sexual activity: Not Currently  Other Topics Concern   Not on file  Social History Narrative   Pt has 2 living adult children, 1 desease at age 74 secondary congential heart disease    Social Determinants of Health   Financial Resource Strain:    Difficulty of Paying Living Expenses: Not on file  Food Insecurity:    Worried About Charity fundraiser in the Last Year: Not on file   [employer] of Food in the Last Year: Not on file  Transportation Needs:    Lack of Transportation (Medical): Not on file   Lack of Transportation (Non-Medical): Not on file  Physical Activity:    Days of Exercise per Week: Not on file   Minutes of Exercise per Session: Not on file  Stress:    Feeling of Stress : Not on file  Social Connections:    Frequency of Communication with Friends and Family: Not on file   Frequency of Social Gatherings with Friends and Family: Not on file   Attends Religious Services: Not on file  Active Member of Clubs or Organizations: Not on file   Attends Archivist Meetings: Not on file   Marital Status: Not on file  Intimate Partner Violence:    Fear of Current or Ex-Partner: Not on file   Emotionally Abused: Not on file   Physically Abused: Not on file   Sexually Abused: Not on file    FAMILY HISTORY:  Family History  Problem Relation Age of Onset   Heart attack Mother 58   Heart disease Mother    Cancer Mother        type unknown   Diabetes Mother    Kidney failure Father    Kidney disease Father    Myasthenia gravis Brother    Diabetes Sister    Breast cancer Sister 57   Heart disease  Brother    Diabetes Sister    Sickle cell anemia Sister    Cancer Sister        unknown type    CURRENT MEDICATIONS:  Current Outpatient Medications  Medication Sig Dispense Refill   acetaminophen (TYLENOL 8 HOUR ARTHRITIS PAIN) 650 MG CR tablet Take 650 mg by mouth every 8 (eight) hours as needed for pain.  (Patient not taking: Reported on 02/05/2020)     amLODipine (NORVASC) 10 MG tablet TAKE 1 TABLET BY MOUTH  DAILY 90 tablet 3   aspirin (ASPIRIN ADULT LOW STRENGTH) 81 MG EC tablet Take 1 tablet (81 mg total) by mouth daily. 30 tablet 12   benazepril (LOTENSIN) 20 MG tablet TAKE 1 TABLET BY MOUTH  DAILY 90 tablet 3   buPROPion (WELLBUTRIN XL) 150 MG 24 hr tablet Take 1 tablet (150 mg total) by mouth daily. 30 tablet 3   Calcium-Magnesium-Vitamin D (CALCIUM 1200+D3 PO) Take 1 tablet by mouth 2 (two) times daily.     diphenhydrAMINE (BENADRYL) 25 mg capsule Take 25 mg by mouth at bedtime as needed (for allergies/sleep.).  (Patient not taking: Reported on 02/05/2020)     diphenhydrAMINE (SOMINEX) 25 MG tablet Take by mouth.     docusate sodium (COLACE) 50 MG capsule Take 50 mg by mouth daily.     gabapentin (NEURONTIN) 100 MG capsule TAKE 1 TO 2 CAPSULES BY  MOUTH 3 TIMES DAILY 540 capsule 3   hydrochlorothiazide (HYDRODIURIL) 25 MG tablet TAKE 1 TABLET BY MOUTH  DAILY 90 tablet 3   levothyroxine (SYNTHROID) 100 MCG tablet TAKE (1) TABLET BY MOUTH ONCE DAILY. TAKE 30 MINUTES BEFORE FIRST MEAL OF THE DAY. DRINKWATER. 90 tablet 0   Multiple Vitamin (MULTIVITAMIN WITH MINERALS) TABS tablet Take 1 tablet by mouth daily.     omeprazole (PRILOSEC) 20 MG capsule Take 20 mg by mouth daily as needed (for acid reflux.).      polyethylene glycol powder (GLYCOLAX/MIRALAX) 17 GM/SCOOP powder Take by mouth.     pravastatin (PRAVACHOL) 20 MG tablet TAKE 1 TABLET BY MOUTH IN  THE EVENING 90 tablet 3   No current facility-administered medications for this visit.    ALLERGIES:  No Known  Allergies  PHYSICAL EXAM:  Performance status (ECOG): 1 - Symptomatic but completely ambulatory  Vitals:   02/28/20 1158  BP: 128/71  Pulse: 88  Resp: 18  Temp: (!) 97 F (36.1 C)  SpO2: 100%   Wt Readings from Last 3 Encounters:  02/28/20 197 lb (89.4 kg)  02/05/20 199 lb 9.6 oz (90.5 kg)  11/27/19 200 lb 1.9 oz (90.8 kg)   Physical Exam Vitals reviewed.  Constitutional:  Appearance: Normal appearance. She is obese.  Cardiovascular:     Rate and Rhythm: Normal rate and regular rhythm.     Pulses: Normal pulses.     Heart sounds: Normal heart sounds.  Pulmonary:     Effort: Pulmonary effort is normal.     Breath sounds: Normal breath sounds.  Musculoskeletal:     Right lower leg: No edema.     Left lower leg: No edema.  Neurological:     General: No focal deficit present.     Mental Status: She is alert and oriented to person, place, and time.  Psychiatric:        Mood and Affect: Mood normal.        Behavior: Behavior normal.     LABORATORY DATA:  I have reviewed the labs as listed.  CBC Latest Ref Rng & Units 01/29/2020 12/18/2019 08/07/2019  WBC 4.0 - 10.5 K/uL 5.0 4.9 4.5  Hemoglobin 12.0 - 15.0 g/dL 10.2(L) 10.7(L) 10.8(L)  Hematocrit 36 - 46 % 33.6(L) 34.3(L) 35.8(L)  Platelets 150 - 400 K/uL 215 223 215   CMP Latest Ref Rng & Units 01/29/2020 12/18/2019 11/30/2019  Glucose 70 - 99 mg/dL 134(H) 117(H) 99  BUN 8 - 23 mg/dL 31(H) 34(H) 25  Creatinine 0.44 - 1.00 mg/dL 1.93(H) 2.05(H) 1.58(H)  Sodium 135 - 145 mmol/L 136 136 137  Potassium 3.5 - 5.1 mmol/L 3.6 3.7 4.4  Chloride 98 - 111 mmol/L 110 105 107  CO2 22 - 32 mmol/L 18(L) 21(L) 23  Calcium 8.9 - 10.3 mg/dL 9.1 9.7 9.6  Total Protein 6.5 - 8.1 g/dL 9.3(H) 9.4(H) 9.0(H)  Total Bilirubin 0.3 - 1.2 mg/dL 0.4 0.5 0.4  Alkaline Phos 38 - 126 U/L 68 63 -  AST 15 - 41 U/L $Remo'26 24 23  'eHxWZ$ ALT 0 - 44 U/L $Remo'22 21 18      'UUlVe$ Component Value Date/Time   RBC 3.89 01/29/2020 1035   MCV 86.4 01/29/2020 1035   MCH 26.2  01/29/2020 1035   MCHC 30.4 01/29/2020 1035   RDW 13.5 01/29/2020 1035   LYMPHSABS 1.7 01/29/2020 1035   MONOABS 0.4 01/29/2020 1035   EOSABS 0.2 01/29/2020 1035   BASOSABS 0.0 01/29/2020 1035   Lab Results  Component Value Date   LDH 133 01/29/2020   LDH 133 12/18/2019   LDH 148 08/07/2019   Lab Results  Component Value Date   TIBC 308 01/29/2020   TIBC 322 12/18/2019   TIBC 335 08/07/2019   FERRITIN 66 01/29/2020   FERRITIN 81 12/18/2019   FERRITIN 96 08/07/2019   IRONPCTSAT 22 01/29/2020   IRONPCTSAT 16 12/18/2019   IRONPCTSAT 26 08/07/2019   Lab Results  Component Value Date   TOTALPROTELP 8.7 (H) 01/29/2020   TOTALPROTELP 8.4 01/29/2020   ALBUMINELP 3.8 01/29/2020   A1GS 0.3 01/29/2020   A2GS 1.0 01/29/2020   BETS 1.1 01/29/2020   BETA2SER 3.0 (L) 08/13/2013   GAMS 2.6 (H) 01/29/2020   MSPIKE 2.3 (H) 01/29/2020   SPEI Comment 01/29/2020    Lab Results  Component Value Date   KPAFRELGTCHN 308.6 (H) 01/29/2020   LAMBDASER 21.3 01/29/2020   KAPLAMBRATIO 14.49 (H) 01/29/2020   Lab Results  Component Value Date   IGGSERUM 3,003 (H) 01/29/2020   IGGSERUM 3,174 (H) 01/29/2020   IGGSERUM 3,337 (H) 01/29/2020   IGA 69 01/29/2020   IGA 72 01/29/2020   IGA 75 01/29/2020   IGMSERUM 50 01/29/2020   IGMSERUM 50 01/29/2020   IGMSERUM 45  01/29/2020     DIAGNOSTIC IMAGING:  I have independently reviewed the scans and discussed with the patient. NM PET Image Initial (PI) Skull Base To Thigh  Result Date: 02/26/2020 CLINICAL DATA:  Initial treatment strategy for monoclonal gammopathy. EXAM: NUCLEAR MEDICINE PET SKULL BASE TO THIGH TECHNIQUE: 11.4 mCi F-18 FDG was injected intravenously. Full-ring PET imaging was performed from the skull vertex to thigh after the radiotracer. CT data was obtained and used for attenuation correction and anatomic localization. Fasting blood glucose: 84 mg/dl COMPARISON:  Metastatic bone survey dated 02/06/2019 FINDINGS: Mediastinal blood  pool activity: SUV max 2.1 Liver activity: SUV max 2.9 HEAD/NECK: No significant abnormal hypermetabolic activity in this region. Incidental CT findings: Left thyroid nodule about 1.9 cm in diameter. This is not hypermetabolic, and was previously worked up on prior thyroid ultrasounds from 01/08/2010 and 12/12/2008. This has been evaluated on previous imaging. (ref: J Am Coll Radiol. 2015 Feb;12(2): 143-50). CHEST: No significant abnormal hypermetabolic activity in this region. Incidental CT findings: Aberrant right subclavian artery passes behind the esophagus. Atherosclerotic calcification of the thoracic aorta and branch vessels. Densely calcified mitral valve. Stranding in the anterior mediastinum likely representing residual thymic tissue, without a masslike contour. Moderate to large hiatal hernia. Mild atelectasis or scarring inferiorly in the right upper lobe and in both lower lobes. ABDOMEN/PELVIS: No significant abnormal hypermetabolic activity in this region. Incidental CT findings: Low position of the anorectal junction, query mild rectal prolapse. Aortoiliac atherosclerotic vascular disease. Photopenic mild hypodensity anteriorly in the right kidney upper pole, probably a cyst, as was described on renal ultrasound of 01/24/2020. Sigmoid colon diverticulosis. SKELETON: No significant abnormal hypermetabolic activity in this region. Incidental CT findings: Lower lumbar spondylosis and degenerative disc disease, with grade 1 anterolisthesis at L4-5. No appreciable lytic lesion characteristic of myeloma. Left eccentric postoperative findings at L5-S1 in the spine. IMPRESSION: 1. No findings of active malignancy or significant hypermetabolic lesion. 2. Other imaging findings of potential clinical significance: Aberrant right subclavian artery passes behind the esophagus. Aortic Atherosclerosis (ICD10-I70.0). Mitral valve calcification. Moderate to large sized hiatal hernia. Mild atelectasis particularly in  the lung bases. Pelvic floor laxity potentially with mild rectal prolapse. Sigmoid colon diverticulosis. Probable cyst in the right kidney upper pole. Lower lumbar spondylosis and degenerative disc disease. Electronically Signed   By: Van Clines M.D.   On: 02/26/2020 08:16     ASSESSMENT:  1. IgG kappa MGUS: -Bone marrow biopsy on 05/16/2015 shows limited bone marrow sample with no increase in plasma cells in the very limited material present. Cytogenetics-41, XX. FISH panel was normal. -Bone survey on 02/22/2017 showed ill-defined calcified lesion in the distal right femur. MRI of the right femur on 03/02/2017 showed no osseous lesion. -Skeletal survey on 02/06/2019 did not show any lytic lesions. -Labs on 01/29/2020 shows M spike increased to 2.3 g.  Kappa light chains increased to 308 and ratio 14.49.  Immunofixation was IgG kappa. -PET scan on 02/25/2020 was negative for any myeloma lesions or other malignancies.  2. Normocytic anemia: -This is from chronic kidney disease. Hemoglobin is 10.8. -Ferritin was 106 and percent saturation is 26. N82 and folic acid were normal. -Denies any bleeding per rectum or melena. We will plan to repeat her labs along with ferritin and iron panel prior to next visit.  3. CKD: -Creatinine has been stable between 1.4 and 1.5. -Renal ultrasound on 01/24/2020 shows small right renal cyst with no additional findings.   PLAN:  1. IgG kappa MGUS: -We  reviewed results of the PET scan which did not show any evidence of myeloma or plasmacytoma.  No other malignancies noted. -We will monitor her myeloma labs in 3 months.  2. Normocytic anemia: -Received Feraheme on 02/08/2020 and 02/15/2020.  V14, folic acid were normal. -Energy levels have improved.  I plan to repeat her ferritin and iron panel in 3 months.  3. CKD: -Creatinine was 1.93 on 01/29/2020.  Orders placed this encounter:  No orders of the defined types were placed in this  encounter.    Derek Jack, MD [place] [phone]   I, Milinda Antis, am acting as a scribe for Dr. Sanda Linger.  I, Derek Jack MD, have reviewed the above documentation for accuracy and completeness, and I agree with the above.

## 2020-03-04 DIAGNOSIS — N170 Acute kidney failure with tubular necrosis: Secondary | ICD-10-CM | POA: Diagnosis not present

## 2020-03-04 DIAGNOSIS — N189 Chronic kidney disease, unspecified: Secondary | ICD-10-CM | POA: Diagnosis not present

## 2020-03-04 DIAGNOSIS — E1122 Type 2 diabetes mellitus with diabetic chronic kidney disease: Secondary | ICD-10-CM | POA: Diagnosis not present

## 2020-03-04 DIAGNOSIS — R809 Proteinuria, unspecified: Secondary | ICD-10-CM | POA: Diagnosis not present

## 2020-03-04 DIAGNOSIS — E1129 Type 2 diabetes mellitus with other diabetic kidney complication: Secondary | ICD-10-CM | POA: Diagnosis not present

## 2020-03-07 DIAGNOSIS — N170 Acute kidney failure with tubular necrosis: Secondary | ICD-10-CM | POA: Diagnosis not present

## 2020-03-07 DIAGNOSIS — I129 Hypertensive chronic kidney disease with stage 1 through stage 4 chronic kidney disease, or unspecified chronic kidney disease: Secondary | ICD-10-CM | POA: Diagnosis not present

## 2020-03-07 DIAGNOSIS — D638 Anemia in other chronic diseases classified elsewhere: Secondary | ICD-10-CM | POA: Diagnosis not present

## 2020-03-07 DIAGNOSIS — N189 Chronic kidney disease, unspecified: Secondary | ICD-10-CM | POA: Diagnosis not present

## 2020-03-07 DIAGNOSIS — E211 Secondary hyperparathyroidism, not elsewhere classified: Secondary | ICD-10-CM | POA: Diagnosis not present

## 2020-03-25 ENCOUNTER — Encounter: Payer: Self-pay | Admitting: Family Medicine

## 2020-03-25 ENCOUNTER — Other Ambulatory Visit: Payer: Self-pay | Admitting: *Deleted

## 2020-03-25 ENCOUNTER — Other Ambulatory Visit: Payer: Self-pay

## 2020-03-25 ENCOUNTER — Ambulatory Visit (INDEPENDENT_AMBULATORY_CARE_PROVIDER_SITE_OTHER): Payer: Medicare Other | Admitting: Family Medicine

## 2020-03-25 ENCOUNTER — Other Ambulatory Visit (HOSPITAL_COMMUNITY)
Admission: AD | Admit: 2020-03-25 | Discharge: 2020-03-25 | Disposition: A | Discharge status: Home / Self Care | Payer: Medicare Other | Source: Skilled Nursing Facility | Attending: Family Medicine | Admitting: Family Medicine

## 2020-03-25 VITALS — BP 138/78 | HR 97 | Temp 98.7°F | Resp 18 | Ht 66.0 in | Wt 193.4 lb

## 2020-03-25 DIAGNOSIS — Z1231 Encounter for screening mammogram for malignant neoplasm of breast: Secondary | ICD-10-CM

## 2020-03-25 DIAGNOSIS — E1149 Type 2 diabetes mellitus with other diabetic neurological complication: Secondary | ICD-10-CM | POA: Insufficient documentation

## 2020-03-25 DIAGNOSIS — I10 Essential (primary) hypertension: Secondary | ICD-10-CM

## 2020-03-25 DIAGNOSIS — Z23 Encounter for immunization: Secondary | ICD-10-CM | POA: Diagnosis not present

## 2020-03-25 DIAGNOSIS — E785 Hyperlipidemia, unspecified: Secondary | ICD-10-CM | POA: Diagnosis not present

## 2020-03-25 DIAGNOSIS — F324 Major depressive disorder, single episode, in partial remission: Secondary | ICD-10-CM

## 2020-03-25 MED ORDER — LEVOTHYROXINE SODIUM 100 MCG PO TABS: ORAL_TABLET | ORAL | 0 refills | Status: DC

## 2020-03-25 MED ORDER — BUPROPION HCL ER (XL) 150 MG PO TB24
150.0000 mg | ORAL_TABLET | Freq: Every day | ORAL | 3 refills | Status: DC
Start: 2020-03-25 — End: 2020-03-26

## 2020-03-25 NOTE — Progress Notes (Signed)
Renee Collins     MRN: [id number]      DOB: [birth year]   HPI Renee Collins is here for follow up and re-evaluation of chronic medical conditions, medication management and review of any available recent lab and radiology data.  Preventive health is updated, specifically  Cancer screening and Immunization.   Questions or concerns regarding consultations or procedures which the PT has had in the interim are  addressed. The PT denies any adverse reactions to current medications since the last visit.  Hypopigmented nion pruritic rash on front of scalp x 1 year increasing in size 3 day h/o dry cough ROS Denies recent fever or chills. Denies sinus pressure, nasal congestion, ear pain or sore throat. 3 day h/o cough productive of white phlegm, no fevr , cjhills or body aches, increased allergy symptoms Denies chest pains, palpitations and leg swelling Denies abdominal pain, nausea, vomiting,diarrhea or constipation.   Denies dysuria, frequency, hesitancy or incontinence. Denies joint pain, swelling and limitation in mobility. Denies headaches, seizures, numbness, or tingling. Denies depression, anxiety or insomnia. Denies skin break down or rash.   PE  BP 138/78 (BP Location: Left Arm, Patient Position: Sitting, Cuff Size: Normal)   Pulse 97   Temp 98.7 F (37.1 C) (Temporal)   Resp 18   Ht 5\' 6"  (1.676 m)   Wt 193 lb 6.4 oz (87.7 kg)   SpO2 97%   BMI 31.22 kg/m   Patient alert and oriented and in no cardiopulmonary distress.  HEENT: No facial asymmetry, EOMI,     Neck supple .  Chest: Clear to auscultation bilaterally.  CVS: S1, S2 no murmurs, no S3.Regular rate.  ABD: Soft non tender.   Ext: No edema  MS: Adequate ROM spine, shoulders, hips and knees.  Skin: Intact, no ulcerations or rash noted.  Psych: Good eye contact, normal affect. Memory intact not anxious or depressed appearing.  CNS: CN 2-12 intact, power,  normal throughout.no focal deficits  noted.   Assessment & Plan  Type 2 diabetes mellitus (HCC) Controlled, no change in medication Renee Collins is reminded of the importance of commitment to daily physical activity for 30 minutes or more, as able and the need to limit carbohydrate intake to 30 to 60 grams per meal to help with blood sugar control.  Diet controlled   Renee Collins is reminded of the importance of daily foot exam, annual eye examination, and good blood sugar, blood pressure and cholesterol control.  Diabetic Labs Latest Ref Rng & Units 03/25/2020 01/29/2020 12/18/2019 11/30/2019 08/07/2019  HbA1c <5.7 % of total Hgb - - - 6.7(H) -  Microalbumin Not Estab. ug/mL 433.8(H) - - - -  Micro/Creat Ratio 0 - 29 mg/g creat - - - - -  Chol <200 mg/dL - - - 145 -  HDL > OR = 50 mg/dL - - - 51 -  Calc LDL mg/dL (calc) - - - 79 -  Triglycerides <150 mg/dL - - - 70 -  Creatinine 0.44 - 1.00 mg/dL - 1.93(H) 2.05(H) 1.58(H) 1.51(H)   BP/Weight 03/25/2020 02/28/2020 02/15/2020 02/08/2020 02/05/2020 11/27/2019 0/02/2329  Systolic BP 076 226 333 545 625 638 937  Diastolic BP 78 71 56 69 66 80 79  Wt. (Lbs) 193.4 197 - - 199.6 200.12 203  BMI 31.22 32.78 - - 33.22 33.3 33.78   Foot/eye exam completion dates Latest Ref Rng & Units 11/27/2019 11/04/2018  Eye Exam No Retinopathy - No Retinopathy  Foot Form Completion - Done -  Obesity, unspecified  Patient re-educated about  the importance of commitment to a  minimum of 150 minutes of exercise per week as able.  The importance of healthy food choices with portion control discussed, as well as eating regularly and within a 12 hour window most days. The need to choose "clean , green" food 50 to 75% of the time is discussed, as well as to make water the primary drink and set a goal of 64 ounces water daily.    Weight /BMI 03/25/2020 02/28/2020 02/05/2020  WEIGHT 193 lb 6.4 oz 197 lb 199 lb 9.6 oz  HEIGHT 5\' 6"  - -  BMI 31.22 kg/m2 32.78 kg/m2 33.22 kg/m2       Dyslipidemia Hyperlipidemia:Low fat diet discussed and encouraged.   Lipid Panel  Lab Results  Component Value Date   CHOL 145 11/30/2019   HDL 51 11/30/2019   LDLCALC 79 11/30/2019   TRIG 70 11/30/2019   CHOLHDL 2.8 11/30/2019       Depression, major, single episode, in partial remission (HCC) Markedly improved on medication, continue same

## 2020-03-25 NOTE — Patient Instructions (Addendum)
Annual physical exam in office with mD 12/10 or shortly after, call if you need me before  Please schedule mammogram at checkout.   Flu vaccine today  microalb from office today  Fasting lipid, cmp and eGFr, hBA1C 3 to 5 days before December visit  Congrats on weight loss and thankful that you are doing well  It is important that you exercise regularly at least 30 minutes 5 times a week. If you develop chest pain, have severe difficulty breathing, or feel very tired, stop exercising immediately and seek medical attention  Think about what you will eat, plan ahead. Choose " clean, green, fresh or frozen" over canned, processed or packaged foods which are more sugary, salty and fatty. 70 to 75% of food eaten should be vegetables and fruit. Three meals at set times with snacks allowed between meals, but they must be fruit or vegetables. Aim to eat over a 12 hour period , example 7 am to 7 pm, and STOP after  your last meal of the day. Drink water,generally about 64 ounces per day, no other drink is as healthy. Fruit juice is best enjoyed in a healthy way, by EATING the fruit. Thanks for choosing [facility], we consider it a privelige to serve you.

## 2020-03-26 ENCOUNTER — Encounter: Payer: Self-pay | Admitting: Family Medicine

## 2020-03-26 ENCOUNTER — Other Ambulatory Visit: Payer: Self-pay

## 2020-03-26 LAB — MICROALBUMIN, URINE: Microalb, Ur: 433.8 ug/mL — ABNORMAL HIGH

## 2020-03-26 MED ORDER — BUPROPION HCL ER (XL) 150 MG PO TB24
150.0000 mg | ORAL_TABLET | Freq: Every day | ORAL | 1 refills | Status: DC
Start: 2020-03-26 — End: 2020-08-25

## 2020-03-28 ENCOUNTER — Encounter: Payer: Self-pay | Admitting: Family Medicine

## 2020-03-28 DIAGNOSIS — F322 Major depressive disorder, single episode, severe without psychotic features: Secondary | ICD-10-CM | POA: Insufficient documentation

## 2020-03-28 DIAGNOSIS — F324 Major depressive disorder, single episode, in partial remission: Secondary | ICD-10-CM | POA: Insufficient documentation

## 2020-03-28 NOTE — Assessment & Plan Note (Signed)
Markedly improved on medication, continue same

## 2020-03-28 NOTE — Assessment & Plan Note (Signed)
Hyperlipidemia:Low fat diet discussed and encouraged.   Lipid Panel  Lab Results  Component Value Date   CHOL 145 11/30/2019   HDL 51 11/30/2019   LDLCALC 79 11/30/2019   TRIG 70 11/30/2019   CHOLHDL 2.8 11/30/2019

## 2020-03-28 NOTE — Assessment & Plan Note (Addendum)
Controlled, no change in medication Ms. Fenderson is reminded of the importance of commitment to daily physical activity for 30 minutes or more, as able and the need to limit carbohydrate intake to 30 to 60 grams per meal to help with blood sugar control.  Diet controlled   Ms. Wamboldt is reminded of the importance of daily foot exam, annual eye examination, and good blood sugar, blood pressure and cholesterol control.  Diabetic Labs Latest Ref Rng & Units 03/25/2020 01/29/2020 12/18/2019 11/30/2019 08/07/2019  HbA1c <5.7 % of total Hgb - - - 6.7(H) -  Microalbumin Not Estab. ug/mL 433.8(H) - - - -  Micro/Creat Ratio 0 - 29 mg/g creat - - - - -  Chol <200 mg/dL - - - 145 -  HDL > OR = 50 mg/dL - - - 51 -  Calc LDL mg/dL (calc) - - - 79 -  Triglycerides <150 mg/dL - - - 70 -  Creatinine 0.44 - 1.00 mg/dL - 1.93(H) 2.05(H) 1.58(H) 1.51(H)   BP/Weight 03/25/2020 02/28/2020 02/15/2020 02/08/2020 02/05/2020 11/27/2019 03/22/4627  Systolic BP 638 177 116 579 038 333 832  Diastolic BP 78 71 56 69 66 80 79  Wt. (Lbs) 193.4 197 - - 199.6 200.12 203  BMI 31.22 32.78 - - 33.22 33.3 33.78   Foot/eye exam completion dates Latest Ref Rng & Units 11/27/2019 11/04/2018  Eye Exam No Retinopathy - No Retinopathy  Foot Form Completion - Done -

## 2020-03-28 NOTE — Assessment & Plan Note (Signed)
  Patient re-educated about  the importance of commitment to a  minimum of 150 minutes of exercise per week as able.  The importance of healthy food choices with portion control discussed, as well as eating regularly and within a 12 hour window most days. The need to choose "clean , green" food 50 to 75% of the time is discussed, as well as to make water the primary drink and set a goal of 64 ounces water daily.    Weight /BMI 03/25/2020 02/28/2020 02/05/2020  WEIGHT 193 lb 6.4 oz 197 lb 199 lb 9.6 oz  HEIGHT 5\' 6"  - -  BMI 31.22 kg/m2 32.78 kg/m2 33.22 kg/m2

## 2020-04-01 ENCOUNTER — Encounter: Payer: Self-pay | Admitting: Family Medicine

## 2020-04-01 ENCOUNTER — Telehealth (INDEPENDENT_AMBULATORY_CARE_PROVIDER_SITE_OTHER): Payer: Medicare Other | Admitting: Family Medicine

## 2020-04-01 ENCOUNTER — Other Ambulatory Visit: Payer: Self-pay

## 2020-04-01 VITALS — BP 138/78 | HR 97 | Resp 18 | Ht 66.0 in | Wt 195.0 lb

## 2020-04-01 DIAGNOSIS — Z0000 Encounter for general adult medical examination without abnormal findings: Secondary | ICD-10-CM

## 2020-04-01 NOTE — Progress Notes (Signed)
Subjective:   Renee Collins is a 79 y.o. female who presents for Medicare Annual (Subsequent) preventive examination.  Method of visit: Telephone Location of Patient: Home Location of Provider: Office Consent was obtain for visit over the telephone. Services rendered by provider: Visit was performed via telephone.  I verified that I am speaking with the correct person using two identifiers.  Review of Systems     Cardiac Risk Factors include: advanced age (>45mn, >>97women);diabetes mellitus;dyslipidemia;obesity (BMI >30kg/m2)     Objective:    Today's Vitals   04/01/20 0958 04/01/20 0959  BP: 138/78   Pulse: 97   Resp: 18   Weight: 195 lb (88.5 kg)   Height: 5' 6" (1.676 m)   PainSc: 0-No pain 0-No pain   Body mass index is 31.47 kg/m.  Advanced Directives 02/08/2020 02/05/2020 12/25/2019 12/12/2019 08/22/2019 02/13/2019 08/14/2018  Does Patient Have a Medical Advance Directive? No Yes No Yes Yes Yes No  Type of Advance Directive - - - - Living will;Healthcare Power of Attorney Living will;Healthcare Power of Attorney -  Does patient want to make changes to medical advance directive? No - Patient declined No - Patient declined No - Patient declined No - Patient declined No - Patient declined No - Patient declined -  Copy of [place] Chart? - - - - - No - copy requested -  Would patient like information on creating a medical advance directive? No - Patient declined No - Patient declined No - Patient declined - No - Patient declined - -  Pre-existing out of facility DNR order (yellow form or pink MOST form) - - - - - - -    Current Medications (verified) Outpatient Encounter Medications as of 04/01/2020  Medication Sig  . acetaminophen (TYLENOL 8 HOUR ARTHRITIS PAIN) 650 MG CR tablet Take 650 mg by mouth every 8 (eight) hours as needed for pain.   .Marland KitchenamLODipine (NORVASC) 10 MG tablet TAKE 1 TABLET BY MOUTH  DAILY  . aspirin (ASPIRIN ADULT LOW STRENGTH) 81  MG EC tablet Take 1 tablet (81 mg total) by mouth daily.  .Marland KitchenbuPROPion (WELLBUTRIN XL) 150 MG 24 hr tablet Take 1 tablet (150 mg total) by mouth daily.  . Calcium-Magnesium-Vitamin D (CALCIUM 1200+D3 PO) Take 1 tablet by mouth 2 (two) times daily.  . diphenhydrAMINE (BENADRYL) 25 mg capsule Take 25 mg by mouth at bedtime as needed (for allergies/sleep.).   .Marland Kitchengabapentin (NEURONTIN) 100 MG capsule TAKE 1 TO 2 CAPSULES BY  MOUTH 3 TIMES DAILY  . hydrochlorothiazide (HYDRODIURIL) 25 MG tablet TAKE 1 TABLET BY MOUTH  DAILY  . levothyroxine (SYNTHROID) 100 MCG tablet TAKE (1) TABLET BY MOUTH ONCE DAILY. TAKE 30 MINUTES BEFORE FIRST MEAL OF THE DAY. DRINKWATER.  . Multiple Vitamin (MULTIVITAMIN WITH MINERALS) TABS tablet Take 1 tablet by mouth daily.  .Marland Kitchenomeprazole (PRILOSEC) 20 MG capsule Take 20 mg by mouth daily as needed (for acid reflux.).   .Marland Kitchenpravastatin (PRAVACHOL) 20 MG tablet TAKE 1 TABLET BY MOUTH IN  THE EVENING   No facility-administered encounter medications on file as of 04/01/2020.    Allergies (verified) Patient has no known allergies.   History: Past Medical History:  Diagnosis Date  . Anemia   . Chronic back pain   . Depression   . Diabetes mellitus   . GERD (gastroesophageal reflux disease)   . Hyperlipidemia   . Hypertension   . Hypothyroidism    Past Surgical History:  Procedure Laterality Date  . BONE MARROW ASPIRATION Left 05/16/15  . BONE MARROW BIOPSY Left 05/16/15  . COLONOSCOPY  03/30/2012   Procedure: COLONOSCOPY;  Surgeon: Rogene Houston, MD;  Location: [facility];  Service: Endoscopy;  Laterality: N/A;  730  . COLONOSCOPY N/A 06/30/2017   Procedure: COLONOSCOPY;  Surgeon: Rogene Houston, MD;  Location: [facility];  Service: Endoscopy;  Laterality: N/A;  2:00  . LAMINECTOMY  1980's  . POLYPECTOMY  06/30/2017   Procedure: POLYPECTOMY;  Surgeon: Rogene Houston, MD;  Location: [facility];  Service: Endoscopy;;  colon  . SPINE SURGERY  approx 1983   dr  Joya Salm  . TOTAL ABDOMINAL HYSTERECTOMY  approx 1993   fibroids    Family History  Problem Relation Age of Onset  . Heart attack Mother 24  . Heart disease Mother   . Cancer Mother        type unknown  . Diabetes Mother   . Kidney failure Father   . Kidney disease Father   . Myasthenia gravis Brother   . Diabetes Sister   . Breast cancer Sister 69  . Heart disease Brother   . Diabetes Sister   . Sickle cell anemia Sister   . Cancer Sister        unknown type   Social History   Socioeconomic History  . Marital status: Divorced    Spouse name: Not on file  . Number of children: 3  . Years of education: Not on file  . Highest education level: Not on file  Occupational History  . Occupation: LPN  Tobacco Use  . Smoking status: Never Smoker  . Smokeless tobacco: Never Used  Vaping Use  . Vaping Use: Never used  Substance and Sexual Activity  . Alcohol use: No  . Drug use: No  . Sexual activity: Not Currently  Other Topics Concern  . Not on file  Social History Narrative   Pt has 2 living adult children, 1 desease at age 44 secondary congential heart disease    Social Determinants of Health   Financial Resource Strain: Low Risk   . Difficulty of Paying Living Expenses: Not hard at all  Food Insecurity: No Food Insecurity  . Worried About Charity fundraiser in the Last Year: Never true  . Ran Out of Food in the Last Year: Never true  Transportation Needs: No Transportation Needs  . Lack of Transportation (Medical): No  . Lack of Transportation (Non-Medical): No  Physical Activity: Insufficiently Active  . Days of Exercise per Week: 3 days  . Minutes of Exercise per Session: 30 min  Stress: No Stress Concern Present  . Feeling of Stress : Only a little  Social Connections: Moderately Integrated  . Frequency of Communication with Friends and Family: More than three times a week  . Frequency of Social Gatherings with Friends and Family: Once a week  . Attends  Religious Services: More than 4 times per year  . Active Member of Clubs or Organizations: Yes  . Attends Archivist Meetings: More than 4 times per year  . Marital Status: Divorced    Tobacco Counseling Counseling given: Yes   Clinical Intake:  Pre-visit preparation completed: Yes  Pain : No/denies pain Pain Score: 0-No pain     BMI - recorded: 31.47 Nutritional Status: BMI > 30  Obese Nutritional Risks: None Diabetes: Yes CBG done?: No Did pt. bring in CBG monitor from home?: No  How  often do you need to have someone help you when you read instructions, pamphlets, or other written materials from your doctor or pharmacy?: 1 - Never What is the last grade level you completed in school?: GED + nursing degree  Diabetic? yes  Interpreter Needed?: No      Activities of Daily Living In your present state of health, do you have any difficulty performing the following activities: 04/01/2020  Hearing? N  Vision? N  Difficulty concentrating or making decisions? N  Walking or climbing stairs? N  Dressing or bathing? N  Doing errands, shopping? N  Preparing Food and eating ? N  Using the Toilet? N  In the past six months, have you accidently leaked urine? N  Do you have problems with loss of bowel control? N  Managing your Medications? N  Managing your Finances? N  Housekeeping or managing your Housekeeping? N  Some recent data might be hidden    Patient Care Team: [name], MD as PCP - General (Family Medicine) Whitney Muse, Kelby Fam, MD (Inactive) as Consulting Physician (Hematology and Oncology) Rutherford Guys, MD as Consulting Physician (Ophthalmology) [name], MD  Indicate any recent Medical Services you may have received from other than [facility] providers in the past year (date may be approximate).     Assessment:   This is a routine wellness examination for [place].  Hearing/Vision screen No exam data present  Dietary issues and  exercise activities discussed: Current Exercise Habits: Home exercise routine, Type of exercise: walking, Time (Minutes): 30, Frequency (Times/Week): 3, Weekly Exercise (Minutes/Week): 90, Intensity: Moderate, Exercise limited by: None identified  Goals    . Have 3 meals a day     Recommend eating 3 balanced meals a day.      Depression Screen PHQ 2/9 Scores 04/01/2020 04/01/2020 03/25/2020 11/27/2019 03/30/2019 02/05/2019 09/27/2018  PHQ - 2 Score 0 0 0 2 0 0 0  PHQ- 9 Score - - - 9 - - -    Fall Risk Fall Risk  04/01/2020 03/25/2020 11/27/2019 06/12/2019 03/30/2019  Falls in the past year? 0 0 0 0 0  Number falls in past yr: - 0 0 0 0  Injury with Fall? - 0 0 0 0  Risk for fall due to : - No Fall Risks - - -  Risk for fall due to: Comment - - - - -  Follow up - Falls evaluation completed - - -    Any stairs in or around the home? yes If so, are there any without handrails? No  Home free of loose throw rugs in walkways, pet beds, electrical cords, etc? Yes  Adequate lighting in your home to reduce risk of falls? Yes   ASSISTIVE DEVICES UTILIZED TO PREVENT FALLS:  Life alert? No  Use of a cane, walker or w/c? No  Grab bars in the bathroom? Yes  Shower chair or bench in shower? No  Elevated toilet seat or a handicapped toilet? No   TIMED UP AND GO:  Was the test performed? No .  Length of time to ambulate 10 feet:    Cognitive Function:     6CIT Screen 04/01/2020 03/30/2019 03/27/2018 10/25/2016  What Year? 0 points 0 points 0 points 0 points  What month? 0 points 0 points 0 points 0 points  What time? 0 points 0 points 0 points 0 points  Count back from 20 0 points 0 points 0 points 0 points  Months in reverse 0 points 0  points 0 points 0 points  Repeat phrase 0 points 0 points 4 points 0 points  Total Score 0 0 4 0    Immunizations Immunization History  Administered Date(s) Administered  . Fluad Quad(high Dose 65+) 02/19/2019, 03/25/2020  . Influenza Split 04/28/2011,  04/12/2012  . Influenza,inj,Quad PF,6+ Mos 04/09/2013, 05/02/2014, 03/11/2015, 05/28/2016, 03/22/2017, 03/27/2018  . Moderna SARS-COVID-2 Vaccination 08/04/2019, 09/04/2019  . Pneumococcal Conjugate-13 09/30/2014  . Pneumococcal Polysaccharide-23 04/28/2011  . Tdap 04/28/2011  . Zoster 08/26/2011  . Zoster Recombinat (Shingrix) 02/28/2019, 06/13/2019    TDAP status: Up to date Flu Vaccine status: Up to date Pneumococcal vaccine status: Up to date Covid-19 vaccine status: Completed vaccines  Qualifies for Shingles Vaccine? Yes   Zostavax completed Yes   Shingrix Completed?: Yes  Screening Tests Health Maintenance  Topic Date Due  . OPHTHALMOLOGY EXAM  11/04/2019  . HEMOGLOBIN A1C  05/31/2020  . FOOT EXAM  11/26/2020  . URINE MICROALBUMIN  03/25/2021  . TETANUS/TDAP  04/27/2021  . COLONOSCOPY  06/30/2022  . INFLUENZA VACCINE  Completed  . DEXA SCAN  Completed  . COVID-19 Vaccine  Completed  . PNA vac Low Risk Adult  Completed    Health Maintenance  Health Maintenance Due  Topic Date Due  . OPHTHALMOLOGY EXAM  11/04/2019    Colorectal cancer screening: Completed [id number]. Repeat every no longer needed after last one years Mammogram status: Completed [id number]. Repeat every year Bone Density status: Completed 2019. Results reflect: Bone density results: NORMAL. Repeat every 5 years.  Lung Cancer Screening: (Low Dose CT Chest recommended if Age 90-80 years, 30 pack-year currently smoking OR have quit w/in 15years.) does not qualify.   Lung Cancer Screening Referral:   Additional Screening:  Hepatitis C Screening: does not qualify; Completed   Vision Screening: Recommended annual ophthalmology exams for early detection of glaucoma and other disorders of the eye. Is the patient up to date with their annual eye exam?  Yes  Who is the provider or what is the name of the office in which the patient attends annual eye exams? Dr.Jones If pt is not established with a  provider, would they like to be referred to a provider to establish care? No .   Dental Screening: Recommended annual dental exams for proper oral hygiene  Community Resource Referral / Chronic Care Management: CRR required this visit?  No   CCM required this visit?  No      Plan:     1. Encounter for Medicare annual wellness exam   I have personally reviewed and noted the following in the patient's chart:   . Medical and social history . Use of alcohol, tobacco or illicit drugs  . Current medications and supplements . Functional ability and status . Nutritional status . Physical activity . Advanced directives . List of other physicians . Hospitalizations, surgeries, and ER visits in previous 12 months . Vitals . Screenings to include cognitive, depression, and falls . Referrals and appointments  In addition, I have reviewed and discussed with patient certain preventive protocols, quality metrics, and best practice recommendations. A written personalized care plan for preventive services as well as general preventive health recommendations were provided to patient.     Perlie Mayo, NP   04/01/2020    I provided 20 minutes of non-face-to-face time during this encounter.

## 2020-04-01 NOTE — Patient Instructions (Signed)
Renee Collins , Thank you for taking time to come for your Medicare Wellness Visit. I appreciate your ongoing commitment to your health goals. Please review the following plan we discussed and let me know if I can assist you in the future.   Please continue to practice social distancing to keep you, your family, and our community safe.  If you must go out, please wear a Mask and practice good handwashing.  Screening recommendations/referrals: Colonoscopy: up to date Mammogram: up to date Bone Density: up to date Recommended yearly ophthalmology/optometry visit for glaucoma screening and checkup Recommended yearly dental visit for hygiene and checkup  Vaccinations: up to date   Advanced directives: completed  Conditions/risks identified: Fall  Next appointment: 06/10/2020   Preventive Care 37 Years and Older, Female Preventive care refers to lifestyle choices and visits with your health care provider that can promote health and wellness. What does preventive care include?  A yearly physical exam. This is also called an annual well check.  Dental exams once or twice a year.  Routine eye exams. Ask your health care provider how often you should have your eyes checked.  Personal lifestyle choices, including:  Daily care of your teeth and gums.  Regular physical activity.  Eating a healthy diet.  Avoiding tobacco and drug use.  Limiting alcohol use.  Practicing safe sex.  Taking low-dose aspirin every day.  Taking vitamin and mineral supplements as recommended by your health care provider. What happens during an annual well check? The services and screenings done by your health care provider during your annual well check will depend on your age, overall health, lifestyle risk factors, and family history of disease. Counseling  Your health care provider may ask you questions about your:  Alcohol use.  Tobacco use.  Drug use.  Emotional well-being.  Home and  relationship well-being.  Sexual activity.  Eating habits.  History of falls.  Memory and ability to understand (cognition).  Work and work Statistician.  Reproductive health. Screening  You may have the following tests or measurements:  Height, weight, and BMI.  Blood pressure.  Lipid and cholesterol levels. These may be checked every 5 years, or more frequently if you are over 25 years old.  Skin check.  Lung cancer screening. You may have this screening every year starting at age 35 if you have a 30-pack-year history of smoking and currently smoke or have quit within the past 15 years.  Fecal occult blood test (FOBT) of the stool. You may have this test every year starting at age 67.  Flexible sigmoidoscopy or colonoscopy. You may have a sigmoidoscopy every 5 years or a colonoscopy every 10 years starting at age 27.  Hepatitis C blood test.  Hepatitis B blood test.  Sexually transmitted disease (STD) testing.  Diabetes screening. This is done by checking your blood sugar (glucose) after you have not eaten for a while (fasting). You may have this done every 1-3 years.  Bone density scan. This is done to screen for osteoporosis. You may have this done starting at age 71.  Mammogram. This may be done every 1-2 years. Talk to your health care provider about how often you should have regular mammograms. Talk with your health care provider about your test results, treatment options, and if necessary, the need for more tests. Vaccines  Your health care provider may recommend certain vaccines, such as:  Influenza vaccine. This is recommended every year.  Tetanus, diphtheria, and acellular pertussis (Tdap, Td) vaccine.  You may need a Td booster every 10 years.  Zoster vaccine. You may need this after age 24.  Pneumococcal 13-valent conjugate (PCV13) vaccine. One dose is recommended after age 42.  Pneumococcal polysaccharide (PPSV23) vaccine. One dose is recommended after  age 68. Talk to your health care provider about which screenings and vaccines you need and how often you need them. This information is not intended to replace advice given to you by your health care provider. Make sure you discuss any questions you have with your health care provider. Document Released: 07/11/2015 Document Revised: 03/03/2016 Document Reviewed: 04/15/2015 Elsevier Interactive Patient Education  2017 [place] Prevention in the Home Falls can cause injuries. They can happen to people of all ages. There are many things you can do to make your home safe and to help prevent falls. What can I do on the outside of my home?  Regularly fix the edges of walkways and driveways and fix any cracks.  Remove anything that might make you trip as you walk through a door, such as a raised step or threshold.  Trim any bushes or trees on the path to your home.  Use bright outdoor lighting.  Clear any walking paths of anything that might make someone trip, such as rocks or tools.  Regularly check to see if handrails are loose or broken. Make sure that both sides of any steps have handrails.  Any raised decks and porches should have guardrails on the edges.  Have any leaves, snow, or ice cleared regularly.  Use sand or salt on walking paths during winter.  Clean up any spills in your garage right away. This includes oil or grease spills. What can I do in the bathroom?  Use night lights.  Install grab bars by the toilet and in the tub and shower. Do not use towel bars as grab bars.  Use non-skid mats or decals in the tub or shower.  If you need to sit down in the shower, use a plastic, non-slip stool.  Keep the floor dry. Clean up any water that spills on the floor as soon as it happens.  Remove soap buildup in the tub or shower regularly.  Attach bath mats securely with double-sided non-slip rug tape.  Do not have throw rugs and other things on the floor that can  make you trip. What can I do in the bedroom?  Use night lights.  Make sure that you have a light by your bed that is easy to reach.  Do not use any sheets or blankets that are too big for your bed. They should not hang down onto the floor.  Have a firm chair that has side arms. You can use this for support while you get dressed.  Do not have throw rugs and other things on the floor that can make you trip. What can I do in the kitchen?  Clean up any spills right away.  Avoid walking on wet floors.  Keep items that you use a lot in easy-to-reach places.  If you need to reach something above you, use a strong step stool that has a grab bar.  Keep electrical cords out of the way.  Do not use floor polish or wax that makes floors slippery. If you must use wax, use non-skid floor wax.  Do not have throw rugs and other things on the floor that can make you trip. What can I do with my stairs?  Do not leave any  items on the stairs.  Make sure that there are handrails on both sides of the stairs and use them. Fix handrails that are broken or loose. Make sure that handrails are as long as the stairways.  Check any carpeting to make sure that it is firmly attached to the stairs. Fix any carpet that is loose or worn.  Avoid having throw rugs at the top or bottom of the stairs. If you do have throw rugs, attach them to the floor with carpet tape.  Make sure that you have a light switch at the top of the stairs and the bottom of the stairs. If you do not have them, ask someone to add them for you. What else can I do to help prevent falls?  Wear shoes that:  Do not have high heels.  Have rubber bottoms.  Are comfortable and fit you well.  Are closed at the toe. Do not wear sandals.  If you use a stepladder:  Make sure that it is fully opened. Do not climb a closed stepladder.  Make sure that both sides of the stepladder are locked into place.  Ask someone to hold it for you,  if possible.  Clearly mark and make sure that you can see:  Any grab bars or handrails.  First and last steps.  Where the edge of each step is.  Use tools that help you move around (mobility aids) if they are needed. These include:  Canes.  Walkers.  Scooters.  Crutches.  Turn on the lights when you go into a dark area. Replace any light bulbs as soon as they burn out.  Set up your furniture so you have a clear path. Avoid moving your furniture around.  If any of your floors are uneven, fix them.  If there are any pets around you, be aware of where they are.  Review your medicines with your doctor. Some medicines can make you feel dizzy. This can increase your chance of falling. Ask your doctor what other things that you can do to help prevent falls. This information is not intended to replace advice given to you by your health care provider. Make sure you discuss any questions you have with your health care provider. Document Released: 04/10/2009 Document Revised: 11/20/2015 Document Reviewed: 07/19/2014 Elsevier Interactive Patient Education  2017 [employer].

## 2020-04-28 DIAGNOSIS — N189 Chronic kidney disease, unspecified: Secondary | ICD-10-CM | POA: Diagnosis not present

## 2020-04-28 DIAGNOSIS — N170 Acute kidney failure with tubular necrosis: Secondary | ICD-10-CM | POA: Diagnosis not present

## 2020-04-28 DIAGNOSIS — E1122 Type 2 diabetes mellitus with diabetic chronic kidney disease: Secondary | ICD-10-CM | POA: Diagnosis not present

## 2020-04-28 DIAGNOSIS — E211 Secondary hyperparathyroidism, not elsewhere classified: Secondary | ICD-10-CM | POA: Diagnosis not present

## 2020-04-28 DIAGNOSIS — I129 Hypertensive chronic kidney disease with stage 1 through stage 4 chronic kidney disease, or unspecified chronic kidney disease: Secondary | ICD-10-CM | POA: Diagnosis not present

## 2020-05-01 DIAGNOSIS — R809 Proteinuria, unspecified: Secondary | ICD-10-CM | POA: Diagnosis not present

## 2020-05-01 DIAGNOSIS — D472 Monoclonal gammopathy: Secondary | ICD-10-CM | POA: Diagnosis not present

## 2020-05-01 DIAGNOSIS — D638 Anemia in other chronic diseases classified elsewhere: Secondary | ICD-10-CM | POA: Diagnosis not present

## 2020-05-01 DIAGNOSIS — I129 Hypertensive chronic kidney disease with stage 1 through stage 4 chronic kidney disease, or unspecified chronic kidney disease: Secondary | ICD-10-CM | POA: Diagnosis not present

## 2020-05-01 DIAGNOSIS — N189 Chronic kidney disease, unspecified: Secondary | ICD-10-CM | POA: Diagnosis not present

## 2020-05-15 DIAGNOSIS — R809 Proteinuria, unspecified: Secondary | ICD-10-CM | POA: Diagnosis not present

## 2020-05-15 DIAGNOSIS — E1129 Type 2 diabetes mellitus with other diabetic kidney complication: Secondary | ICD-10-CM | POA: Diagnosis not present

## 2020-05-15 DIAGNOSIS — E1122 Type 2 diabetes mellitus with diabetic chronic kidney disease: Secondary | ICD-10-CM | POA: Diagnosis not present

## 2020-05-15 DIAGNOSIS — N189 Chronic kidney disease, unspecified: Secondary | ICD-10-CM | POA: Diagnosis not present

## 2020-05-15 DIAGNOSIS — I129 Hypertensive chronic kidney disease with stage 1 through stage 4 chronic kidney disease, or unspecified chronic kidney disease: Secondary | ICD-10-CM | POA: Diagnosis not present

## 2020-05-19 ENCOUNTER — Other Ambulatory Visit: Payer: Self-pay | Admitting: Family Medicine

## 2020-05-29 ENCOUNTER — Inpatient Hospital Stay (HOSPITAL_COMMUNITY): Payer: Medicare Other

## 2020-06-02 ENCOUNTER — Ambulatory Visit: Payer: Medicare Other | Admitting: Podiatry

## 2020-06-02 ENCOUNTER — Other Ambulatory Visit: Payer: Self-pay

## 2020-06-02 ENCOUNTER — Encounter: Payer: Self-pay | Admitting: Podiatry

## 2020-06-02 DIAGNOSIS — B351 Tinea unguium: Secondary | ICD-10-CM

## 2020-06-02 DIAGNOSIS — M79674 Pain in right toe(s): Secondary | ICD-10-CM | POA: Diagnosis not present

## 2020-06-02 DIAGNOSIS — Z794 Long term (current) use of insulin: Secondary | ICD-10-CM

## 2020-06-02 DIAGNOSIS — L84 Corns and callosities: Secondary | ICD-10-CM

## 2020-06-02 DIAGNOSIS — M79675 Pain in left toe(s): Secondary | ICD-10-CM | POA: Diagnosis not present

## 2020-06-02 DIAGNOSIS — N1832 Chronic kidney disease, stage 3b: Secondary | ICD-10-CM

## 2020-06-02 DIAGNOSIS — E0822 Diabetes mellitus due to underlying condition with diabetic chronic kidney disease: Secondary | ICD-10-CM

## 2020-06-04 DIAGNOSIS — I10 Essential (primary) hypertension: Secondary | ICD-10-CM | POA: Diagnosis not present

## 2020-06-04 DIAGNOSIS — E785 Hyperlipidemia, unspecified: Secondary | ICD-10-CM | POA: Diagnosis not present

## 2020-06-04 DIAGNOSIS — E1149 Type 2 diabetes mellitus with other diabetic neurological complication: Secondary | ICD-10-CM | POA: Diagnosis not present

## 2020-06-05 ENCOUNTER — Ambulatory Visit (HOSPITAL_COMMUNITY): Payer: Medicare Other | Admitting: Hematology and Oncology

## 2020-06-05 LAB — CMP14+EGFR
ALT: 18 IU/L (ref 0–32)
AST: 23 IU/L (ref 0–40)
Albumin/Globulin Ratio: 0.9 — ABNORMAL LOW (ref 1.2–2.2)
Albumin: 4.3 g/dL (ref 3.6–4.6)
Alkaline Phosphatase: 79 IU/L (ref 44–121)
BUN/Creatinine Ratio: 10 — ABNORMAL LOW (ref 12–28)
BUN: 17 mg/dL (ref 8–27)
Bilirubin Total: 0.5 mg/dL (ref 0.0–1.2)
CO2: 23 mmol/L (ref 20–29)
Calcium: 9.7 mg/dL (ref 8.7–10.3)
Chloride: 102 mmol/L (ref 96–106)
Creatinine, Ser: 1.69 mg/dL — ABNORMAL HIGH (ref 0.57–1.00)
GFR calc Af Amer: 32 mL/min/{1.73_m2} — ABNORMAL LOW (ref >59)
GFR calc non Af Amer: 28 mL/min/{1.73_m2} — ABNORMAL LOW (ref >59)
Globulin, Total: 4.6 g/dL — ABNORMAL HIGH (ref 1.5–4.5)
Glucose: 113 mg/dL — ABNORMAL HIGH (ref 65–99)
Potassium: 4.3 mmol/L (ref 3.5–5.2)
Sodium: 139 mmol/L (ref 134–144)
Total Protein: 8.9 g/dL — ABNORMAL HIGH (ref 6.0–8.5)

## 2020-06-05 LAB — LIPID PANEL
Chol/HDL Ratio: 2.6 ratio (ref 0.0–4.4)
Cholesterol, Total: 154 mg/dL (ref 100–199)
HDL: 59 mg/dL (ref >39)
LDL Chol Calc (NIH): 81 mg/dL (ref 0–99)
Triglycerides: 75 mg/dL (ref 0–149)
VLDL Cholesterol Cal: 14 mg/dL (ref 5–40)

## 2020-06-05 LAB — HEMOGLOBIN A1C
Est. average glucose Bld gHb Est-mCnc: 140 mg/dL
Hgb A1c MFr Bld: 6.5 % — ABNORMAL HIGH (ref 4.8–5.6)

## 2020-06-06 LAB — MICROALBUMIN, URINE: Microalbumin, Urine: 381.6 ug/mL

## 2020-06-08 NOTE — Progress Notes (Signed)
Subjective:  Patient ID: Sarina Ill, female    DOB: [birth year],  MRN: [id number]  79 y.o. female presents with at risk foot care. Pt has h/o NIDDM with chronic kidney disease and callus(es) b/l and painful thick toenails that are difficult to trim. Painful toenails interfere with ambulation. Aggravating factors include wearing enclosed shoe gear. Pain is relieved with periodic professional debridement. Painful calluses are aggravated when weightbearing with and without shoegear. Pain is relieved with periodic professional debridement.   Patient controls diabetes via diet.  PCP: [name], MD and last visit was: 03/25/2020.  Review of Systems: Negative except as noted in the HPI.  Past Medical History:  Diagnosis Date  . Anemia   . Chronic back pain   . Depression   . Diabetes mellitus   . GERD (gastroesophageal reflux disease)   . Hyperlipidemia   . Hypertension   . Hypothyroidism    Past Surgical History:  Procedure Laterality Date  . BONE MARROW ASPIRATION Left 05/16/15  . BONE MARROW BIOPSY Left 05/16/15  . COLONOSCOPY  03/30/2012   Procedure: COLONOSCOPY;  Surgeon: Rogene Houston, MD;  [facility];  Service: Endoscopy;  Laterality: N/A;  730  . COLONOSCOPY N/A 06/30/2017   Procedure: COLONOSCOPY;  Surgeon: Rogene Houston, MD;  [facility];  Service: Endoscopy;  Laterality: N/A;  2:00  . LAMINECTOMY  1980's  . POLYPECTOMY  06/30/2017   Procedure: POLYPECTOMY;  Surgeon: Rogene Houston, MD;  [facility]: [facility];  Service: Endoscopy;;  colon  . SPINE SURGERY  approx 1983   dr Joya Salm  . TOTAL ABDOMINAL HYSTERECTOMY  approx 1993   fibroids    Patient Active Problem List   Diagnosis Date Noted  . Depression, major, single episode, in partial remission ([place]) 03/28/2020  . Early satiety 11/27/2019  . Sciatica of right side 03/28/2018  . Obesity, unspecified 11/25/2017  . Annual physical exam 02/12/2015  . Monoclonal gammopathy  08/13/2013  . 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  . Stage 3 chronic kidney disease ([place]) 04/09/2013  . Cataracts, bilateral 04/09/2013  . Seasonal allergies 02/16/2011  . Type 2 diabetes mellitus ([place]) 01/28/2011  . THYROID NODULE 01/05/2010  . KNEE, ARTHRITIS, DEGEN./OSTEO 05/01/2009  . Thyroid nodule 11/11/2008  . Dyslipidemia 11/11/2008  . Anemia in chronic kidney disease 11/11/2008  . Essential hypertension 06/14/2008    Current Outpatient Medications:  .  acetaminophen (TYLENOL 8 HOUR ARTHRITIS PAIN) 650 MG CR tablet, Take 650 mg by mouth every 8 (eight) hours as needed for pain. , Disp: , Rfl:  .  amLODipine (NORVASC) 10 MG tablet, TAKE 1 TABLET BY MOUTH  DAILY, Disp: 90 tablet, Rfl: 3 .  aspirin (ASPIRIN ADULT LOW STRENGTH) 81 MG EC tablet, Take 1 tablet (81 mg total) by mouth daily., Disp: 30 tablet, Rfl: 12 .  benazepril (LOTENSIN) 5 MG tablet, Take by mouth., Disp: , Rfl:  .  buPROPion (WELLBUTRIN XL) 150 MG 24 hr tablet, Take 1 tablet (150 mg total) by mouth daily., Disp: 90 tablet, Rfl: 1 .  Calcium-Magnesium-Vitamin D (CALCIUM 1200+D3 PO), Take 1 tablet by mouth 2 (two) times daily., Disp: , Rfl:  .  diphenhydrAMINE (BENADRYL) 25 mg capsule, Take 25 mg by mouth at bedtime as needed (for allergies/sleep.). , Disp: , Rfl:  .  gabapentin (NEURONTIN) 100 MG capsule, TAKE 1 TO 2 CAPSULES BY  MOUTH 3 TIMES DAILY, Disp: 540 capsule, Rfl: 3 .  hydrochlorothiazide (HYDRODIURIL) 25  MG tablet, TAKE 1 TABLET BY MOUTH  DAILY, Disp: 90 tablet, Rfl: 3 .  levothyroxine (SYNTHROID) 100 MCG tablet, TAKE 1 TABLET BY MOUTH ONCE DAILY 1/2 HOUR BEFORE FIRST MEAL OF THE DAY. DRINK  WATER, Disp: 90 tablet, Rfl: 3 .  Multiple Vitamin (MULTIVITAMIN WITH MINERALS) TABS tablet, Take 1 tablet by mouth daily., Disp: , Rfl:  .  omeprazole (PRILOSEC) 20 MG capsule, Take 20 mg by mouth daily as needed (for acid reflux.). , Disp: , Rfl:  .  pravastatin (PRAVACHOL) 20 MG tablet,  TAKE 1 TABLET BY MOUTH IN  THE EVENING, Disp: 90 tablet, Rfl: 3 No Known Allergies Social History   Tobacco Use  Smoking Status Never Smoker  Smokeless Tobacco Never Used    Objective:  There were no vitals filed for this visit. Constitutional Patient is a pleasant 79 y.o. African American female obese in NAD. AAO x 3.  Vascular Capillary refill time to digits immediate b/l. Palpable pedal pulses b/l LE. Pedal hair present. Lower extremity skin temperature gradient within normal limits. No edema noted b/l lower extremities. No cyanosis or clubbing noted.  Neurologic Normal speech. Protective sensation intact 5/5 intact bilaterally with 10g monofilament b/l. Vibratory sensation intact b/l. Proprioception intact bilaterally.  Dermatologic Pedal skin with normal turgor, texture and tone bilaterally. No open wounds bilaterally. No interdigital macerations bilaterally. Toenails 1-5 b/l elongated, discolored, dystrophic, thickened, crumbly with subungual debris and tenderness to dorsal palpation. Hyperkeratotic lesion(s) submet head 5 left foot.  No erythema, no edema, no drainage, no fluctuance.  Orthopedic: Normal muscle strength 5/5 to all lower extremity muscle groups bilaterally. No pain crepitus or joint limitation noted with ROM b/l. No gross bony deformities bilaterally.   Hemoglobin A1C Latest Ref Rng & Units 06/04/2020 11/30/2019 06/12/2019  HGBA1C 4.8 - 5.6 % 6.5(H) 6.7(H) 6.8(H)  Some recent data might be hidden   Assessment:   1. Pain due to onychomycosis of toenails of both feet   2. Callus   3. Diabetes mellitus due to underlying condition with stage 3b chronic kidney disease, with long-term current use of insulin ([place])    Plan:  Patient was evaluated and treated and all questions answered.  Onychomycosis with pain -Nails palliatively debridement as below. -Educated on self-care  Procedure: Nail Debridement Rationale: Pain Type of Debridement: manual, sharp  debridement. Instrumentation: Nail nipper, rotary burr. Number of Nails: 10  -Examined patient. -Continue diabetic foot care principles. -Patient to continue soft, supportive shoe gear daily. -Toenails 1-5 b/l were debrided in length and girth with sterile nail nippers and dremel without iatrogenic bleeding.  -Callus(es) submet head 5 left foot pared utilizing sterile scalpel blade without complication or incident. Total number debrided =1. -Patient to report any pedal injuries to medical professional immediately. -Patient/POA to call should there be question/concern in the interim.  Return in about 3 months (around 08/31/2020) for diabetic foot care.  Marzetta Board, DPM

## 2020-06-10 ENCOUNTER — Encounter: Payer: Medicare Other | Admitting: Family Medicine

## 2020-06-19 ENCOUNTER — Ambulatory Visit (HOSPITAL_COMMUNITY)
Admission: RE | Admit: 2020-06-19 | Discharge: 2020-06-19 | Disposition: A | Discharge status: Home / Self Care | Payer: Medicare Other | Source: Ambulatory Visit | Attending: Family Medicine | Admitting: Family Medicine

## 2020-06-19 ENCOUNTER — Other Ambulatory Visit: Payer: Self-pay

## 2020-06-19 DIAGNOSIS — Z1231 Encounter for screening mammogram for malignant neoplasm of breast: Secondary | ICD-10-CM | POA: Diagnosis not present

## 2020-07-02 DIAGNOSIS — E1122 Type 2 diabetes mellitus with diabetic chronic kidney disease: Secondary | ICD-10-CM | POA: Diagnosis not present

## 2020-07-02 DIAGNOSIS — N189 Chronic kidney disease, unspecified: Secondary | ICD-10-CM | POA: Diagnosis not present

## 2020-07-02 DIAGNOSIS — D519 Vitamin B12 deficiency anemia, unspecified: Secondary | ICD-10-CM | POA: Diagnosis not present

## 2020-07-02 DIAGNOSIS — Z79899 Other long term (current) drug therapy: Secondary | ICD-10-CM | POA: Diagnosis not present

## 2020-07-02 DIAGNOSIS — E1129 Type 2 diabetes mellitus with other diabetic kidney complication: Secondary | ICD-10-CM | POA: Diagnosis not present

## 2020-07-03 DIAGNOSIS — E1122 Type 2 diabetes mellitus with diabetic chronic kidney disease: Secondary | ICD-10-CM | POA: Diagnosis not present

## 2020-07-03 DIAGNOSIS — R809 Proteinuria, unspecified: Secondary | ICD-10-CM | POA: Diagnosis not present

## 2020-07-03 DIAGNOSIS — E211 Secondary hyperparathyroidism, not elsewhere classified: Secondary | ICD-10-CM | POA: Diagnosis not present

## 2020-07-03 DIAGNOSIS — E876 Hypokalemia: Secondary | ICD-10-CM | POA: Diagnosis not present

## 2020-07-03 DIAGNOSIS — N189 Chronic kidney disease, unspecified: Secondary | ICD-10-CM | POA: Diagnosis not present

## 2020-07-03 DIAGNOSIS — D638 Anemia in other chronic diseases classified elsewhere: Secondary | ICD-10-CM | POA: Diagnosis not present

## 2020-07-03 DIAGNOSIS — E1129 Type 2 diabetes mellitus with other diabetic kidney complication: Secondary | ICD-10-CM | POA: Diagnosis not present

## 2020-07-03 DIAGNOSIS — I129 Hypertensive chronic kidney disease with stage 1 through stage 4 chronic kidney disease, or unspecified chronic kidney disease: Secondary | ICD-10-CM | POA: Diagnosis not present

## 2020-07-08 ENCOUNTER — Ambulatory Visit (INDEPENDENT_AMBULATORY_CARE_PROVIDER_SITE_OTHER): Payer: Medicare Other | Admitting: Nurse Practitioner

## 2020-07-08 ENCOUNTER — Encounter: Payer: Self-pay | Admitting: Nurse Practitioner

## 2020-07-08 ENCOUNTER — Other Ambulatory Visit: Payer: Self-pay

## 2020-07-08 VITALS — BP 149/63 | HR 90 | Temp 98.5°F | Resp 18 | Ht 66.0 in | Wt 192.0 lb

## 2020-07-08 DIAGNOSIS — D631 Anemia in chronic kidney disease: Secondary | ICD-10-CM

## 2020-07-08 DIAGNOSIS — R011 Cardiac murmur, unspecified: Secondary | ICD-10-CM | POA: Diagnosis not present

## 2020-07-08 DIAGNOSIS — E785 Hyperlipidemia, unspecified: Secondary | ICD-10-CM | POA: Diagnosis not present

## 2020-07-08 DIAGNOSIS — Z0000 Encounter for general adult medical examination without abnormal findings: Secondary | ICD-10-CM | POA: Diagnosis not present

## 2020-07-08 DIAGNOSIS — E039 Hypothyroidism, unspecified: Secondary | ICD-10-CM

## 2020-07-08 DIAGNOSIS — I10 Essential (primary) hypertension: Secondary | ICD-10-CM

## 2020-07-08 DIAGNOSIS — N1832 Chronic kidney disease, stage 3b: Secondary | ICD-10-CM

## 2020-07-08 NOTE — Assessment & Plan Note (Signed)
-  pt takes pravastain 20 mg daily now -LDL 81, goal < 70 -recheck in 3 months, and if not at goal will consider increasing dose -diet information included with AVS to help with lifestyle modifications

## 2020-07-08 NOTE — Progress Notes (Signed)
Established Patient Office Visit  Subjective:  Patient ID: Renee Collins, female    DOB: 1940/12/30  Age: 80 y.o. MRN: [id number]  CC:  Chief Complaint  Patient presents with  . Annual Exam    HPI Renee Collins presents for yearly physical exam. No acute issues.  She has hx of DM and CKD. She is followed by Dr. Theador Hawthorne for CKD.  Other medical history as follows.  Past Medical History:  Diagnosis Date  . Anemia   . Chronic back pain   . Depression   . Diabetes mellitus   . GERD (gastroesophageal reflux disease)   . Hyperlipidemia   . Hypertension   . Hypothyroidism     Past Surgical History:  Procedure Laterality Date  . BONE MARROW ASPIRATION Left 05/16/15  . BONE MARROW BIOPSY Left 05/16/15  . COLONOSCOPY  03/30/2012   Procedure: COLONOSCOPY;  Surgeon: Rogene Houston, MD;  [facility];  Service: Endoscopy;  Laterality: N/A;  730  . COLONOSCOPY N/A 06/30/2017   Procedure: COLONOSCOPY;  Surgeon: Rogene Houston, MD;  [facility];  Service: Endoscopy;  Laterality: N/A;  2:00  . LAMINECTOMY  1980's  . POLYPECTOMY  06/30/2017   Procedure: POLYPECTOMY;  Surgeon: Rogene Houston, MD;  [facility];  Service: Endoscopy;;  colon  . SPINE SURGERY  approx 1983   dr Joya Salm  . TOTAL ABDOMINAL HYSTERECTOMY  approx 1993   fibroids     Family History  Problem Relation Age of Onset  . Heart attack Mother 77  . Heart disease Mother   . Cancer Mother        type unknown  . Diabetes Mother   . Kidney failure Father   . Kidney disease Father   . Myasthenia gravis Brother   . Diabetes Sister   . Breast cancer Sister 65  . Heart disease Brother   . Diabetes Sister   . Sickle cell anemia Sister   . Cancer Sister        unknown type    Social History   Socioeconomic History  . Marital status: Divorced    Spouse name: Not on file  . Number of children: 3  . Years of education: Not on file  . Highest education level: Not on file   Occupational History  . Occupation: LPN  Tobacco Use  . Smoking status: Never Smoker  . Smokeless tobacco: Never Used  Vaping Use  . Vaping Use: Never used  Substance and Sexual Activity  . Alcohol use: No  . Drug use: No  . Sexual activity: Not Currently  Other Topics Concern  . Not on file  Social History Narrative   Pt has 2 living adult children, 1 desease at age 58 secondary congential heart disease    Social Determinants of Health   Financial Resource Strain: Low Risk   . Difficulty of Paying Living Expenses: Not hard at all  Food Insecurity: No Food Insecurity  . Worried About Charity fundraiser in the Last Year: Never true  . Ran Out of Food in the Last Year: Never true  Transportation Needs: No Transportation Needs  . Lack of Transportation (Medical): No  . Lack of Transportation (Non-Medical): No  Physical Activity: Insufficiently Active  . Days of Exercise per Week: 3 days  . Minutes of Exercise per Session: 30 min  Stress: No Stress Concern Present  . Feeling of Stress : Only a little  Social Connections:  Moderately Integrated  . Frequency of Communication with Friends and Family: More than three times a week  . Frequency of Social Gatherings with Friends and Family: Once a week  . Attends Religious Services: More than 4 times per year  . Active Member of Clubs or Organizations: Yes  . Attends Archivist Meetings: More than 4 times per year  . Marital Status: Divorced  Human resources officer Violence: Not At Risk  . Fear of Current or Ex-Partner: No  . Emotionally Abused: No  . Physically Abused: No  . Sexually Abused: No    Outpatient Medications Prior to Visit  Medication Sig Dispense Refill  . acetaminophen (TYLENOL) 650 MG CR tablet Take 650 mg by mouth every 8 (eight) hours as needed for pain.     Marland Kitchen amLODipine (NORVASC) 10 MG tablet TAKE 1 TABLET BY MOUTH  DAILY 90 tablet 3  . aspirin 81 MG EC tablet Take 1 tablet (81 mg total) by mouth daily.  30 tablet 12  . buPROPion (WELLBUTRIN XL) 150 MG 24 hr tablet Take 1 tablet (150 mg total) by mouth daily. 90 tablet 1  . Calcium-Magnesium-Vitamin D (CALCIUM 1200+D3 PO) Take 1 tablet by mouth 2 (two) times daily.    . diphenhydrAMINE (BENADRYL) 25 mg capsule Take 25 mg by mouth at bedtime as needed (for allergies/sleep.).     Marland Kitchen gabapentin (NEURONTIN) 100 MG capsule TAKE 1 TO 2 CAPSULES BY  MOUTH 3 TIMES DAILY 540 capsule 3  . hydrochlorothiazide (HYDRODIURIL) 25 MG tablet TAKE 1 TABLET BY MOUTH  DAILY 90 tablet 3  . levothyroxine (SYNTHROID) 100 MCG tablet TAKE 1 TABLET BY MOUTH ONCE DAILY 1/2 HOUR BEFORE FIRST MEAL OF THE DAY. DRINK  WATER 90 tablet 3  . Multiple Vitamin (MULTIVITAMIN WITH MINERALS) TABS tablet Take 1 tablet by mouth daily.    Marland Kitchen omeprazole (PRILOSEC) 20 MG capsule Take 20 mg by mouth daily as needed (for acid reflux.).     Marland Kitchen pravastatin (PRAVACHOL) 20 MG tablet TAKE 1 TABLET BY MOUTH IN  THE EVENING 90 tablet 3   No facility-administered medications prior to visit.    No Known Allergies  ROS Review of Systems    Objective:    Physical Exam  BP (!) 149/63   Pulse 90   Temp 98.5 F (36.9 C)   Resp 18   Ht $R'5\' 6"'Xa$  (1.676 m)   Wt 192 lb (87.1 kg)   SpO2 97%   BMI 30.99 kg/m  Wt Readings from Last 3 Encounters:  07/08/20 192 lb (87.1 kg)  04/01/20 195 lb (88.5 kg)  03/25/20 193 lb 6.4 oz (87.7 kg)     There are no preventive care reminders to display for this patient.  There are no preventive care reminders to display for this patient.  Lab Results  Component Value Date   TSH 0.51 11/30/2019   Lab Results  Component Value Date   WBC 5.0 01/29/2020   HGB 10.2 (L) 01/29/2020   HCT 33.6 (L) 01/29/2020   MCV 86.4 01/29/2020   PLT 215 01/29/2020   Lab Results  Component Value Date   NA 139 06/04/2020   K 4.3 06/04/2020   CO2 23 06/04/2020   GLUCOSE 113 (H) 06/04/2020   BUN 17 06/04/2020   CREATININE 1.69 (H) 06/04/2020   BILITOT 0.5 06/04/2020    ALKPHOS 79 06/04/2020   AST 23 06/04/2020   ALT 18 06/04/2020   PROT 8.9 (H) 06/04/2020   ALBUMIN 4.3 06/04/2020  CALCIUM 9.7 06/04/2020   ANIONGAP 8 01/29/2020   Lab Results  Component Value Date   CHOL 154 06/04/2020   Lab Results  Component Value Date   HDL 59 06/04/2020   Lab Results  Component Value Date   LDLCALC 81 06/04/2020   Lab Results  Component Value Date   TRIG 75 06/04/2020   Lab Results  Component Value Date   CHOLHDL 2.6 06/04/2020   Lab Results  Component Value Date   HGBA1C 6.5 (H) 06/04/2020      Assessment & Plan:   Problem List Items Addressed This Visit      Cardiovascular and Mediastinum   Essential hypertension    -has some dyspnea with exertion -aortic murmur present on auscultation -refer to cardiology -BP slightly elevated today; will wait for cardiology report before adding extra antihypertensives        Other   Dyslipidemia    -pt takes pravastain 20 mg daily now -LDL 81, goal < 70 -recheck in 3 months, and if not at goal will consider increasing dose -diet information included with AVS to help with lifestyle modifications      Anemia in chronic kidney disease    -followed by Dr. Theador Hawthorne      Annual physical exam    -up to date with routine screenings      Murmur, cardiac    -aortic 3/6 -referred to cardiology today       Other Visit Diagnoses    Hypothyroidism, unspecified type    -  Primary   Relevant Orders   TSH + free T4   Murmur       Relevant Orders   Ambulatory referral to Cardiology      No orders of the defined types were placed in this encounter.   Follow-up: Return in about 3 months (around 10/06/2020) for Lab follow-up.    Noreene Larsson, NP

## 2020-07-08 NOTE — Assessment & Plan Note (Signed)
-  has some dyspnea with exertion -aortic murmur present on auscultation -refer to cardiology -BP slightly elevated today; will wait for cardiology report before adding extra antihypertensives

## 2020-07-08 NOTE — Assessment & Plan Note (Signed)
-  followed by Dr. Theador Hawthorne

## 2020-07-08 NOTE — Assessment & Plan Note (Signed)
-  aortic 3/6 -referred to cardiology today

## 2020-07-08 NOTE — Assessment & Plan Note (Signed)
-  up to date with routine screenings

## 2020-07-08 NOTE — Patient Instructions (Addendum)
We will check your thyroid labs today.  Your A1c is at goal.  Your blood pressure and cholesterol we a little elevated today, so we will recheck those in 3 months and modify medications if needed.  I included information to reduce cholesterol. Your BP may be elevated d/t the heart murmur, so I have referred you to cardiology to check out your heart.    Preventing High Cholesterol Cholesterol is a white, waxy substance similar to fat that the human body needs to help build cells. The liver makes all the cholesterol that a person's body needs. Having high cholesterol (hypercholesterolemia) increases your risk for heart disease and stroke. Extra or excess cholesterol comes from the food that you eat. High cholesterol can often be prevented with diet and lifestyle changes. If you already have high cholesterol, you can control it with diet, lifestyle changes, and medicines. How can high cholesterol affect me? If you have high cholesterol, fatty deposits (plaques) may build up on the walls of your blood vessels. The blood vessels that carry blood away from your heart are called arteries. Plaques make the arteries narrower and stiffer. This in turn can:  Restrict or block blood flow and cause blood clots to form.  Increase your risk for heart attack and stroke. What can increase my risk for high cholesterol? This condition is more likely to develop in people who:  Eat foods that are high in saturated fat or cholesterol. Saturated fat is mostly found in foods that come from animal sources.  Are overweight.  Are not getting enough exercise.  Have a family history of high cholesterol (familial hypercholesterolemia). What actions can I take to prevent this? Nutrition  Eat less saturated fat.  Avoid trans fats (partially hydrogenated oils). These are often found in margarine and in some baked goods, fried foods, and snacks bought in packages.  Avoid precooked or cured meat, such as bacon, sausages,  or meat loaves.  Avoid foods and drinks that have added sugars.  Eat more fruits, vegetables, and whole grains.  Choose healthy sources of protein, such as fish, poultry, lean cuts of red meat, beans, peas, lentils, and nuts.  Choose healthy sources of fat, such as: ? Nuts. ? Vegetable oils, especially olive oil. ? Fish that have healthy fats, such as omega-3 fatty acids. These fish include mackerel or salmon.    Lifestyle  Lose weight if you are overweight. Maintaining a healthy body mass index (BMI) can help prevent or control high cholesterol. It can also lower your risk for diabetes and high blood pressure. Ask your health care provider to help you with a diet and exercise plan to lose weight safely.  Do not use any products that contain nicotine or tobacco, such as cigarettes, e-cigarettes, and chewing tobacco. If you need help quitting, ask your health care provider. Alcohol use  Do not drink alcohol if: ? Your health care provider tells you not to drink. ? You are pregnant, may be pregnant, or are planning to become pregnant.  If you drink alcohol: ? Limit how much you use to:  0-1 drink a day for women.  0-2 drinks a day for men. ? Be aware of how much alcohol is in your drink. In the [place] one drink equals one 12 oz bottle of beer (355 mL), one 5 oz glass of wine (148 mL), or one 1 oz glass of hard liquor (44 mL). Activity  Get enough exercise. Do exercises as told by your health care provider.  Each week, do at least 150 minutes of exercise that takes a medium level of effort (moderate-intensity exercise). This kind of exercise: ? Makes your heart beat faster while allowing you to still be able to talk. ? Can be done in short sessions several times a day or longer sessions a few times a week. For example, on 5 days each week, you could walk fast or ride your bike 3 times a day for 10 minutes each time.    Medicines  Your health care provider may recommend  medicines to help lower cholesterol. This may be a medicine to lower the amount of cholesterol that your liver makes. You may need medicine if: ? Diet and lifestyle changes have not lowered your cholesterol enough. ? You have high cholesterol and other risk factors for heart disease or stroke.  Take over-the-counter and prescription medicines only as told by your health care provider. General information  Manage your risk factors for high cholesterol. Talk with your health care provider about all your risk factors and how to lower your risk.  Manage other conditions that you have, such as diabetes or high blood pressure (hypertension).  Have blood tests to check your cholesterol levels at regular points in time as told by your health care provider.  Keep all follow-up visits as told by your health care provider. This is important. Where to find more information  American Heart Association: www.heart.org  National Heart, Lung, and Blood Institute: https://wilson-e[place].com/ Summary  High cholesterol increases your risk for heart disease and stroke. By keeping your cholesterol level low, you can reduce your risk for these conditions.  High cholesterol can often be prevented with diet and lifestyle changes.  Work with your health care provider to manage your risk factors, and have your blood tested regularly. This information is not intended to replace advice given to you by your health care provider. Make sure you discuss any questions you have with your health care provider. Document Revised: 03/27/2019 Document Reviewed: 03/27/2019 Elsevier Patient Education  [place].

## 2020-07-09 ENCOUNTER — Other Ambulatory Visit: Payer: Self-pay | Admitting: Nurse Practitioner

## 2020-07-09 LAB — TSH+FREE T4
Free T4: 1.9 ng/dL — ABNORMAL HIGH (ref 0.82–1.77)
TSH: 0.291 u[IU]/mL — ABNORMAL LOW (ref 0.450–4.500)

## 2020-07-09 MED ORDER — LEVOTHYROXINE SODIUM 88 MCG PO TABS
ORAL_TABLET | ORAL | 1 refills | Status: DC
Start: 2020-07-09 — End: 2020-08-25

## 2020-07-09 NOTE — Addendum Note (Signed)
Addended by: Demetrius Revel on: 07/09/2020 10:21 AM   Modules accepted: Orders

## 2020-07-09 NOTE — Progress Notes (Signed)
I decreased the dose of her thyroid medication to 88 mcg per day. She can stop taking the 100 mcg dose.  Let's recheck her thyroid labs in 6 weeks.

## 2020-07-10 ENCOUNTER — Other Ambulatory Visit: Payer: Self-pay

## 2020-07-10 DIAGNOSIS — E041 Nontoxic single thyroid nodule: Secondary | ICD-10-CM

## 2020-07-21 ENCOUNTER — Other Ambulatory Visit: Payer: Self-pay | Admitting: Family Medicine

## 2020-07-23 ENCOUNTER — Other Ambulatory Visit: Payer: Self-pay | Admitting: Family Medicine

## 2020-08-01 NOTE — Progress Notes (Signed)
CARDIOLOGY CONSULT NOTE       Patient ID: Renee Collins MRN: [id number] DOB/AGE: [birth year] 80 y.o.  Admit date: (Not on file) Referring Physician: Moshe Cipro Primary Physician: [name], MD Primary Cardiologist: New Reason for Consultation: Murmur  Active Problems:   * No active hospital problems. *   HPI:  80 y.o. referred by Dr Moshe Cipro for murmur. History of HTN, HLD, Hypothyroidism, DM and anemia She  Sees Dr Theador Hawthorne for CKD Office visit 07/08/20 [place] [facility] Demetrius Revel NP noted labile BP  And murmur in aortic area. Patient denies history of such. She has not had rheumatic fever. She has not Had previous Echo. She is active for her age with no palpitations, dyspnea, chest pain or syncope Thyroid Studies 07/08/20 showed suppressed TSH with elevated T4  Her A1c is elevated at 6.5 Baseline Cr is 1.7 with GFR 32  She has 2 boys one lives with her. 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 Anemia   . Chronic back pain   . Depression   . Diabetes mellitus   . GERD (gastroesophageal reflux disease)   . Hyperlipidemia   . Hypertension   . Hypothyroidism     Family History  Problem Relation Age of Onset  . Heart attack Mother 25  . Heart disease Mother   . Cancer Mother        type unknown  . Diabetes Mother   . Kidney failure Father   . Kidney disease Father   . Myasthenia gravis Brother   . Diabetes Sister   . Breast cancer Sister 79  . Heart disease Brother   . Diabetes Sister   . Sickle cell anemia Sister   . Cancer Sister        unknown type    Social History   Socioeconomic History  . Marital status: Divorced    Spouse name: Not on  file  . Number of children: 3  . Years of education: Not on file  . Highest education level: Not on file  Occupational History  . Occupation: LPN  Tobacco Use  . Smoking status: Never Smoker  . Smokeless tobacco: Never Used  Vaping Use  . Vaping Use: Never used  Substance and Sexual Activity  . Alcohol use: No  . Drug use: No  . Sexual activity: Not Currently  Other Topics Concern  . Not on file  Social History Narrative   Pt has 2 living adult children, 1 desease at age 9 secondary congential heart disease    Social Determinants of Health   Financial Resource Strain: Low Risk   . Difficulty of Paying Living Expenses: Not hard at all  Food Insecurity: No Food Insecurity  . Worried About Charity fundraiser in the Last Year: Never true  . Ran Out of Food in the Last Year: Never true  Transportation Needs: No Transportation Needs  . Lack of Transportation (Medical): No  . Lack of Transportation (Non-Medical): No  Physical Activity: Insufficiently Active  . Days of Exercise per Week: 3 days  . Minutes of Exercise per Session: 30 min  Stress: No Stress Concern Present  .  Feeling of Stress : Only a little  Social Connections: Moderately Integrated  . Frequency of Communication with Friends and Family: More than three times a week  . Frequency of Social Gatherings with Friends and Family: Once a week  . Attends Religious Services: More than 4 times per year  . Active Member of Clubs or Organizations: Yes  . Attends Archivist Meetings: More than 4 times per year  . Marital Status: Divorced  Human resources officer Violence: Not At Risk  . Fear of Current or Ex-Partner: No  . Emotionally Abused: No  . Physically Abused: No  . Sexually Abused: No    Past Surgical History:  Procedure Laterality Date  . BONE MARROW ASPIRATION Left 05/16/15  . BONE MARROW BIOPSY Left 05/16/15  . COLONOSCOPY  03/30/2012   Procedure: COLONOSCOPY;  Surgeon: Rogene Houston, MD;  Location: [facility];  Service: Endoscopy;  Laterality: N/A;  730  . COLONOSCOPY N/A 06/30/2017   Procedure: COLONOSCOPY;  Surgeon: Rogene Houston, MD;  Location: [facility];  Service: Endoscopy;  Laterality: N/A;  2:00  . LAMINECTOMY  1980's  . POLYPECTOMY  06/30/2017   Procedure: POLYPECTOMY;  Surgeon: Rogene Houston, MD;  Location: [facility];  Service: Endoscopy;;  colon  . SPINE SURGERY  approx 1983   dr Joya Salm  . TOTAL ABDOMINAL HYSTERECTOMY  approx 1993   fibroids       Current Outpatient Medications:  .  acetaminophen (TYLENOL) 650 MG CR tablet, Take 650 mg by mouth every 8 (eight) hours as needed for pain. , Disp: , Rfl:  .  amLODipine (NORVASC) 10 MG tablet, TAKE 1 TABLET BY MOUTH  DAILY, Disp: 90 tablet, Rfl: 3 .  aspirin 81 MG EC tablet, Take 1 tablet (81 mg total) by mouth daily., Disp: 30 tablet, Rfl: 12 .  benazepril (LOTENSIN) 5 MG tablet, Take 5 mg by mouth daily., Disp: , Rfl:  .  buPROPion (WELLBUTRIN XL) 150 MG 24 hr tablet, Take 1 tablet (150 mg total) by mouth daily., Disp: 90 tablet, Rfl: 1 .  Calcium-Magnesium-Vitamin D (CALCIUM 1200+D3 PO), Take 1 tablet by mouth 2 (two) times daily., Disp: , Rfl:  .  diphenhydrAMINE (BENADRYL) 25 mg capsule, Take 25 mg by mouth at bedtime as needed (for allergies/sleep.). , Disp: , Rfl:  .  gabapentin (NEURONTIN) 100 MG capsule, TAKE 1 TO 2 CAPSULES BY  MOUTH 3 TIMES DAILY, Disp: 540 capsule, Rfl: 3 .  hydrochlorothiazide (HYDRODIURIL) 25 MG tablet, TAKE 1 TABLET BY MOUTH  DAILY, Disp: 90 tablet, Rfl: 3 .  levothyroxine (SYNTHROID) 88 MCG tablet, TAKE 1 TABLET BY MOUTH ONCE DAILY 1/2 HOUR BEFORE FIRST MEAL OF THE DAY. DRINK  WATER, Disp: 30 tablet, Rfl: 1 .  Multiple Vitamin (MULTIVITAMIN WITH MINERALS) TABS tablet, Take 1 tablet by mouth daily., Disp: , Rfl:  .  omeprazole (PRILOSEC) 20 MG capsule, Take 20 mg by mouth daily as needed (for acid reflux.). , Disp: , Rfl:  .  pravastatin (PRAVACHOL) 20 MG tablet, TAKE 1 TABLET BY MOUTH IN   THE EVENING, Disp: 90 tablet, Rfl: 3    Physical Exam: Blood pressure 138/80, pulse 86, height $RemoveBe'5\' 6"'PBjUjBqVg$  (1.676 m), weight 89.1 kg, SpO2 97 %.    Affect appropriate Healthy:  appears stated age 81: normal Neck supple with no adenopathy JVP normal no bruits no thyromegaly Lungs clear with no wheezing and good diaphragmatic motion Heart:  S1/S2 SEM AV sclerosis mild AS no AR  murmur, no rub, gallop or click PMI normal Abdomen: benighn, BS positve, no tenderness, no AAA no bruit.  No HSM or HJR Distal pulses intact with no bruits No edema Neuro non-focal Skin warm and dry No muscular weakness   Labs:   Lab Results  Component Value Date   WBC 5.0 01/29/2020   HGB 10.2 (L) 01/29/2020   HCT 33.6 (L) 01/29/2020   MCV 86.4 01/29/2020   PLT 215 01/29/2020   No results for input(s): NA, K, CL, CO2, BUN, CREATININE, CALCIUM, PROT, BILITOT, ALKPHOS, ALT, AST, GLUCOSE in the last 168 hours.  Invalid input(s): LABALBU No results found for: CKTOTAL, CKMB, CKMBINDEX, TROPONINI  Lab Results  Component Value Date   CHOL 154 06/04/2020   CHOL 145 11/30/2019   CHOL 146 06/12/2019   Lab Results  Component Value Date   HDL 59 06/04/2020   HDL 51 11/30/2019   HDL 52 06/12/2019   Lab Results  Component Value Date   LDLCALC 81 06/04/2020   LDLCALC 79 11/30/2019   LDLCALC 75 06/12/2019   Lab Results  Component Value Date   TRIG 75 06/04/2020   TRIG 70 11/30/2019   TRIG 107 06/12/2019   Lab Results  Component Value Date   CHOLHDL 2.6 06/04/2020   CHOLHDL 2.8 11/30/2019   CHOLHDL 2.8 06/12/2019   No results found for: LDLDIRECT    Radiology: No results found.  EKG: SR Rate 76 normal 11/27/19    ASSESSMENT AND PLAN:   1. Murmur:  ? AV sclerosis vs mild stenosis f/u echo ordered  2. HTN:  Well controlled.  Continue current medications and low sodium Dash type diet.   3. DM:  Discussed low carb diet.  Target hemoglobin A1c is 6.5 or less.  Continue current  medications. 4. Thyroid:  Should have replacement dose of synthroid reduced as TSH is suppressed 5. HLD:  On statin labs with primary   TTE for murmur ? AS F/U in a year   Signed: Jenkins Rouge 08/11/2020, 1:51 PM

## 2020-08-11 ENCOUNTER — Encounter: Payer: Self-pay | Admitting: Cardiovascular Disease

## 2020-08-11 ENCOUNTER — Ambulatory Visit: Payer: Medicare Other | Admitting: Cardiovascular Disease

## 2020-08-11 ENCOUNTER — Other Ambulatory Visit: Payer: Self-pay

## 2020-08-11 VITALS — BP 138/80 | HR 86 | Ht 66.0 in | Wt 196.4 lb

## 2020-08-11 DIAGNOSIS — R011 Cardiac murmur, unspecified: Secondary | ICD-10-CM

## 2020-08-11 NOTE — Patient Instructions (Signed)
Medication Instructions:  Your physician recommends that you continue on your current medications as directed. Please refer to the Current Medication list given to you today.  *If you need a refill on your cardiac medications before your next appointment, please call your pharmacy*   Lab Work: None Today If you have labs (blood work) drawn today and your tests are completely normal, you will receive your results only by: Marland Kitchen MyChart Message (if you have MyChart) OR . A paper copy in the mail If you have any lab test that is abnormal or we need to change your treatment, we will call you to review the results.   Testing/Procedures: Your physician has requested that you have an echocardiogram. Echocardiography is a painless test that uses sound waves to create images of your heart. It provides your doctor with information about the size and shape of your heart and how well your heart's chambers and valves are working. This procedure takes approximately one hour. There are no restrictions for this procedure.     Follow-Up: At [facility], you and your health needs are our priority.  As part of our continuing mission to provide you with exceptional heart care, we have created designated Provider Care Teams.  These Care Teams include your primary Cardiologist (physician) and Advanced Practice Providers (APPs -  Physician Assistants and Nurse Practitioners) who all work together to provide you with the care you need, when you need it.  We recommend signing up for the patient portal called "MyChart".  Sign up information is provided on this After Visit Summary.  MyChart is used to connect with patients for Virtual Visits (Telemedicine).  Patients are able to view lab/test results, encounter notes, upcoming appointments, etc.  Non-urgent messages can be sent to your provider as well.   To learn more about what you can do with MyChart, go to [email or web address].    Your next appointment:   12  month(s)  The format for your next appointment:   In Person  Provider:   Jenkins Rouge, MD   Other Instructions None Today

## 2020-08-12 ENCOUNTER — Encounter: Payer: Self-pay | Admitting: Family Medicine

## 2020-08-12 ENCOUNTER — Ambulatory Visit (HOSPITAL_COMMUNITY)
Admission: RE | Admit: 2020-08-12 | Discharge: 2020-08-12 | Disposition: A | Discharge status: Home / Self Care | Payer: Medicare Other | Source: Ambulatory Visit | Attending: Cardiovascular Disease | Admitting: Cardiovascular Disease

## 2020-08-12 ENCOUNTER — Other Ambulatory Visit: Payer: Self-pay

## 2020-08-12 DIAGNOSIS — R011 Cardiac murmur, unspecified: Secondary | ICD-10-CM | POA: Insufficient documentation

## 2020-08-12 LAB — ECHOCARDIOGRAM COMPLETE
AR max vel: 1.11 cm2
AV Area VTI: 1.12 cm2
AV Area mean vel: 1.31 cm2
AV Mean grad: 9 mmHg
AV Peak grad: 19.2 mmHg
Ao pk vel: 2.19 m/s
Area-P 1/2: 3.3 cm2
S' Lateral: 1.8 cm

## 2020-08-12 MED ORDER — PERFLUTREN LIPID MICROSPHERE
1.0000 mL | INTRAVENOUS | Status: AC | PRN
  Administered 2020-08-12: 2 mL via INTRAVENOUS
  Filled 2020-08-12: qty 10

## 2020-08-12 NOTE — Progress Notes (Signed)
*  PRELIMINARY RESULTS* Echocardiogram 2D Echocardiogram has been performed with Definity.  Samuel Germany 08/12/2020, 1:08 PM

## 2020-08-22 DIAGNOSIS — E041 Nontoxic single thyroid nodule: Secondary | ICD-10-CM | POA: Diagnosis not present

## 2020-08-23 ENCOUNTER — Other Ambulatory Visit: Payer: Self-pay | Admitting: Family Medicine

## 2020-08-23 LAB — TSH+T4F+T3FREE
Free T4: 1.43 ng/dL (ref 0.82–1.77)
T3, Free: 2.4 pg/mL (ref 2.0–4.4)
TSH: 2.47 u[IU]/mL (ref 0.450–4.500)

## 2020-08-25 ENCOUNTER — Other Ambulatory Visit: Payer: Self-pay

## 2020-08-25 DIAGNOSIS — E041 Nontoxic single thyroid nodule: Secondary | ICD-10-CM

## 2020-08-25 MED ORDER — LEVOTHYROXINE SODIUM 88 MCG PO TABS: ORAL_TABLET | ORAL | 1 refills | Status: DC

## 2020-08-25 NOTE — Progress Notes (Signed)
Her thyroid labs are great. No reason to change medications.

## 2020-08-29 DIAGNOSIS — E1129 Type 2 diabetes mellitus with other diabetic kidney complication: Secondary | ICD-10-CM | POA: Diagnosis not present

## 2020-08-29 DIAGNOSIS — I129 Hypertensive chronic kidney disease with stage 1 through stage 4 chronic kidney disease, or unspecified chronic kidney disease: Secondary | ICD-10-CM | POA: Diagnosis not present

## 2020-08-29 DIAGNOSIS — E1122 Type 2 diabetes mellitus with diabetic chronic kidney disease: Secondary | ICD-10-CM | POA: Diagnosis not present

## 2020-08-29 DIAGNOSIS — N189 Chronic kidney disease, unspecified: Secondary | ICD-10-CM | POA: Diagnosis not present

## 2020-08-29 DIAGNOSIS — R809 Proteinuria, unspecified: Secondary | ICD-10-CM | POA: Diagnosis not present

## 2020-09-02 ENCOUNTER — Ambulatory Visit: Payer: Medicare Other | Admitting: Podiatry

## 2020-09-04 DIAGNOSIS — E1122 Type 2 diabetes mellitus with diabetic chronic kidney disease: Secondary | ICD-10-CM | POA: Diagnosis not present

## 2020-09-04 DIAGNOSIS — D638 Anemia in other chronic diseases classified elsewhere: Secondary | ICD-10-CM | POA: Diagnosis not present

## 2020-09-04 DIAGNOSIS — E1129 Type 2 diabetes mellitus with other diabetic kidney complication: Secondary | ICD-10-CM | POA: Diagnosis not present

## 2020-09-04 DIAGNOSIS — E211 Secondary hyperparathyroidism, not elsewhere classified: Secondary | ICD-10-CM | POA: Diagnosis not present

## 2020-09-04 DIAGNOSIS — N189 Chronic kidney disease, unspecified: Secondary | ICD-10-CM | POA: Diagnosis not present

## 2020-09-04 DIAGNOSIS — R809 Proteinuria, unspecified: Secondary | ICD-10-CM | POA: Diagnosis not present

## 2020-09-04 DIAGNOSIS — I129 Hypertensive chronic kidney disease with stage 1 through stage 4 chronic kidney disease, or unspecified chronic kidney disease: Secondary | ICD-10-CM | POA: Diagnosis not present

## 2020-09-05 ENCOUNTER — Other Ambulatory Visit: Payer: Self-pay | Admitting: Family Medicine

## 2020-09-22 ENCOUNTER — Other Ambulatory Visit: Payer: Self-pay | Admitting: Nurse Practitioner

## 2020-09-22 DIAGNOSIS — E041 Nontoxic single thyroid nodule: Secondary | ICD-10-CM

## 2020-10-06 ENCOUNTER — Encounter: Payer: Self-pay | Admitting: Family Medicine

## 2020-10-06 ENCOUNTER — Other Ambulatory Visit: Payer: Self-pay

## 2020-10-06 ENCOUNTER — Ambulatory Visit (INDEPENDENT_AMBULATORY_CARE_PROVIDER_SITE_OTHER): Payer: Medicare Other | Admitting: Family Medicine

## 2020-10-06 VITALS — BP 136/80 | HR 100 | Temp 98.0°F | Ht 66.0 in | Wt 195.0 lb

## 2020-10-06 DIAGNOSIS — I10 Essential (primary) hypertension: Secondary | ICD-10-CM

## 2020-10-06 DIAGNOSIS — D472 Monoclonal gammopathy: Secondary | ICD-10-CM | POA: Diagnosis not present

## 2020-10-06 DIAGNOSIS — N1832 Chronic kidney disease, stage 3b: Secondary | ICD-10-CM | POA: Diagnosis not present

## 2020-10-06 DIAGNOSIS — E1149 Type 2 diabetes mellitus with other diabetic neurological complication: Secondary | ICD-10-CM

## 2020-10-06 DIAGNOSIS — N183 Chronic kidney disease, stage 3 unspecified: Secondary | ICD-10-CM

## 2020-10-06 DIAGNOSIS — E785 Hyperlipidemia, unspecified: Secondary | ICD-10-CM | POA: Diagnosis not present

## 2020-10-06 DIAGNOSIS — E039 Hypothyroidism, unspecified: Secondary | ICD-10-CM

## 2020-10-06 DIAGNOSIS — D631 Anemia in chronic kidney disease: Secondary | ICD-10-CM | POA: Diagnosis not present

## 2020-10-06 DIAGNOSIS — E559 Vitamin D deficiency, unspecified: Secondary | ICD-10-CM | POA: Diagnosis not present

## 2020-10-06 DIAGNOSIS — F324 Major depressive disorder, single episode, in partial remission: Secondary | ICD-10-CM

## 2020-10-06 NOTE — Assessment & Plan Note (Signed)
Needs oncology follow up , referral sent

## 2020-10-06 NOTE — Assessment & Plan Note (Signed)
Renee Collins is reminded of the importance of commitment to daily physical activity for 30 minutes or more, as able and the need to limit carbohydrate intake to 30 to 60 grams per meal to help with blood sugar control.   Diet controlled, continue same  Renee Collins is reminded of the importance of daily foot exam, annual eye examination, and good blood sugar, blood pressure and cholesterol control.  Diabetic Labs Latest Ref Rng & Units 06/04/2020 03/25/2020 01/29/2020 12/18/2019 11/30/2019  HbA1c 4.8 - 5.6 % 6.5(H) - - - 6.7(H)  Microalbumin Not Estab. ug/mL - 433.8(H) - - -  Micro/Creat Ratio 0 - 29 mg/g creat - - - - -  Chol 100 - 199 mg/dL 154 - - - 145  HDL >39 mg/dL 59 - - - 51  Calc LDL 0 - 99 mg/dL 81 - - - 79  Triglycerides 0 - 149 mg/dL 75 - - - 70  Creatinine 0.57 - 1.00 mg/dL 1.69(H) - 1.93(H) 2.05(H) 1.58(H)   BP/Weight 10/06/2020 08/12/2020 08/11/2020 07/08/2020 04/01/2020 1/61/0960 09/30/4096  Systolic BP 119 147 829 562 130 865 784  Diastolic BP 80 83 80 63 78 78 71  Wt. (Lbs) 195 - 196.4 192 195 193.4 197  BMI 31.47 - 31.7 30.99 31.47 31.22 32.78   Foot/eye exam completion dates Latest Ref Rng & Units 11/27/2019 11/04/2018  Eye Exam No Retinopathy - No Retinopathy  Foot Form Completion - Done -

## 2020-10-06 NOTE — Assessment & Plan Note (Signed)
  Patient re-educated about  the importance of commitment to a  minimum of 150 minutes of exercise per week as able.  The importance of healthy food choices with portion control discussed, as well as eating regularly and within a 12 hour window most days. The need to choose "clean , green" food 50 to 75% of the time is discussed, as well as to make water the primary drink and set a goal of 64 ounces water daily.    Weight /BMI 10/06/2020 08/11/2020 07/08/2020  WEIGHT 195 lb 196 lb 6.4 oz 192 lb  HEIGHT 5\' 6"  5\' 6"  5\' 6"   BMI 31.47 kg/m2 31.7 kg/m2 30.99 kg/m2

## 2020-10-06 NOTE — Assessment & Plan Note (Signed)
Controlled, no change in medication DASH diet and commitment to daily physical activity for a minimum of 30 minutes discussed and encouraged, as a part of hypertension management. The importance of attaining a healthy weight is also discussed.  BP/Weight 10/06/2020 08/12/2020 08/11/2020 07/08/2020 04/01/2020 11/12/9840 1/0/3128  Systolic BP 118 867 737 366 815 947 076  Diastolic BP 80 83 80 63 78 78 71  Wt. (Lbs) 195 - 196.4 192 195 193.4 197  BMI 31.47 - 31.7 30.99 31.47 31.22 32.78

## 2020-10-06 NOTE — Progress Notes (Signed)
Renee Collins     MRN: [id number]      DOB: 1941/01/03   HPI Renee Collins is here for follow up and re-evaluation of chronic medical conditions, medication management and review of any available recent lab and radiology data.  Preventive health is updated, specifically  Cancer screening and Immunization.   Questions or concerns regarding consultations or procedures which the PT has had in the interim are  addressed. The PT denies any adverse reactions to current medications since the last visit.  There are no new concerns.  There are no specific complaints   ROS Denies recent fever or chills. Denies sinus pressure, nasal congestion, ear pain or sore throat. Denies chest congestion, productive cough or wheezing. Denies chest pains, palpitations and leg swelling Denies abdominal pain, nausea, vomiting,diarrhea or constipation.   Denies dysuria, frequency, hesitancy or incontinence. Denies joint pain, swelling and limitation in mobility. Denies headaches, seizures, numbness, or tingling. Denies depression, anxiety or insomnia. Denies skin break down or rash.   PE  BP 136/80   Pulse 100   Temp 98 F (36.7 C) (Oral)   Ht 5\' 6"  (1.676 m)   Wt 195 lb (88.5 kg)   SpO2 96%   BMI 31.47 kg/m    Patient alert and oriented and in no cardiopulmonary distress.  HEENT: No facial asymmetry, EOMI,     Neck supple .  Chest: Clear to auscultation bilaterally.  CVS: S1, S2 no murmurs, no S3.Regular rate.  ABD: Soft non tender.   Ext: No edema  MS: Adequate ROM spine, shoulders, hips and knees.  Skin: Intact, no ulcerations or rash noted.  Psych: Good eye contact, normal affect. Memory intact not anxious or depressed appearing.  CNS: CN 2-12 intact, power,  normal throughout.no focal deficits noted.   Assessment & Plan  Essential hypertension Controlled, no change in medication DASH diet and commitment to daily physical activity for a minimum of 30 minutes discussed and  encouraged, as a part of hypertension management. The importance of attaining a healthy weight is also discussed.  BP/Weight 10/06/2020 08/12/2020 08/11/2020 07/08/2020 04/01/2020 01/05/6268 10/03/5460  Systolic BP 703 500 938 182 993 716 967  Diastolic BP 80 83 80 63 78 78 71  Wt. (Lbs) 195 - 196.4 192 195 193.4 197  BMI 31.47 - 31.7 30.99 31.47 31.22 32.78       Dyslipidemia Hyperlipidemia:Low fat diet discussed and encouraged.   Lipid Panel  Lab Results  Component Value Date   CHOL 154 06/04/2020   HDL 59 06/04/2020   LDLCALC 81 06/04/2020   TRIG 75 06/04/2020   CHOLHDL 2.6 06/04/2020     Controlled, no change in medication   Monoclonal gammopathy Needs oncology follow up , referral sent  Obesity, unspecified  Patient re-educated about  the importance of commitment to a  minimum of 150 minutes of exercise per week as able.  The importance of healthy food choices with portion control discussed, as well as eating regularly and within a 12 hour window most days. The need to choose "clean , green" food 50 to 75% of the time is discussed, as well as to make water the primary drink and set a goal of 64 ounces water daily.    Weight /BMI 10/06/2020 08/11/2020 07/08/2020  WEIGHT 195 lb 196 lb 6.4 oz 192 lb  HEIGHT 5\' 6"  5\' 6"  5\' 6"   BMI 31.47 kg/m2 31.7 kg/m2 30.99 kg/m2      Type 2 diabetes mellitus ([place]) Renee Collins  is reminded of the importance of commitment to daily physical activity for 30 minutes or more, as able and the need to limit carbohydrate intake to 30 to 60 grams per meal to help with blood sugar control.   Diet controlled, continue same  Renee Collins is reminded of the importance of daily foot exam, annual eye examination, and good blood sugar, blood pressure and cholesterol control.  Diabetic Labs Latest Ref Rng & Units 06/04/2020 03/25/2020 01/29/2020 12/18/2019 11/30/2019  HbA1c 4.8 - 5.6 % 6.5(H) - - - 6.7(H)  Microalbumin Not Estab. ug/mL - 433.8(H) - - -   Micro/Creat Ratio 0 - 29 mg/g creat - - - - -  Chol 100 - 199 mg/dL 154 - - - 145  HDL >39 mg/dL 59 - - - 51  Calc LDL 0 - 99 mg/dL 81 - - - 79  Triglycerides 0 - 149 mg/dL 75 - - - 70  Creatinine 0.57 - 1.00 mg/dL 1.69(H) - 1.93(H) 2.05(H) 1.58(H)   BP/Weight 10/06/2020 08/12/2020 08/11/2020 07/08/2020 04/01/2020 02/07/8870 03/02/9746  Systolic BP 185 501 586 825 749 355 217  Diastolic BP 80 83 80 63 78 78 71  Wt. (Lbs) 195 - 196.4 192 195 193.4 197  BMI 31.47 - 31.7 30.99 31.47 31.22 32.78   Foot/eye exam completion dates Latest Ref Rng & Units 11/27/2019 11/04/2018  Eye Exam No Retinopathy - No Retinopathy  Foot Form Completion - Done -        Depression, major, single episode, in partial remission (HCC) Controlled, no change in medication

## 2020-10-06 NOTE — Assessment & Plan Note (Signed)
Controlled, no change in medication  

## 2020-10-06 NOTE — Assessment & Plan Note (Signed)
Hyperlipidemia:Low fat diet discussed and encouraged.   Lipid Panel  Lab Results  Component Value Date   CHOL 154 06/04/2020   HDL 59 06/04/2020   LDLCALC 81 06/04/2020   TRIG 75 06/04/2020   CHOLHDL 2.6 06/04/2020     Controlled, no change in medication

## 2020-10-06 NOTE — Patient Instructions (Addendum)
Annual exam in office with MD in 2nd or 3rd week in  July , call if you need me sooner, lab review at visit  Fasting lipid, cmp and eGFr, TSH, hBA1C and Vit D first week in July  You need to f/u with oncology regularly , and I am referring you again  It is important that you exercise regularly at least 30 minutes 5 times a week. If you develop chest pain, have severe difficulty breathing, or feel very tired, stop exercising immediately and seek medical attention    Think about what you will eat, plan ahead. Choose " clean, green, fresh or frozen" over canned, processed or packaged foods which are more sugary, salty and fatty. 70 to 75% of food eaten should be vegetables and fruit. Three meals at set times with snacks allowed between meals, but they must be fruit or vegetables. Aim to eat over a 12 hour period , example 7 am to 7 pm, and STOP after  your last meal of the day. Drink water,generally about 64 ounces per day, no other drink is as healthy. Fruit juice is best enjoyed in a healthy way, by EATING the fruit.

## 2020-10-15 ENCOUNTER — Other Ambulatory Visit: Payer: Self-pay

## 2020-10-15 ENCOUNTER — Encounter: Payer: Self-pay | Admitting: Podiatry

## 2020-10-15 ENCOUNTER — Other Ambulatory Visit (HOSPITAL_COMMUNITY): Payer: Self-pay | Admitting: *Deleted

## 2020-10-15 ENCOUNTER — Ambulatory Visit (INDEPENDENT_AMBULATORY_CARE_PROVIDER_SITE_OTHER): Payer: Medicare Other | Admitting: Podiatry

## 2020-10-15 DIAGNOSIS — Z794 Long term (current) use of insulin: Secondary | ICD-10-CM

## 2020-10-15 DIAGNOSIS — D472 Monoclonal gammopathy: Secondary | ICD-10-CM

## 2020-10-15 DIAGNOSIS — D631 Anemia in chronic kidney disease: Secondary | ICD-10-CM

## 2020-10-15 DIAGNOSIS — M79674 Pain in right toe(s): Secondary | ICD-10-CM

## 2020-10-15 DIAGNOSIS — E0822 Diabetes mellitus due to underlying condition with diabetic chronic kidney disease: Secondary | ICD-10-CM

## 2020-10-15 DIAGNOSIS — B351 Tinea unguium: Secondary | ICD-10-CM | POA: Diagnosis not present

## 2020-10-15 DIAGNOSIS — N1832 Chronic kidney disease, stage 3b: Secondary | ICD-10-CM | POA: Diagnosis not present

## 2020-10-15 DIAGNOSIS — M79675 Pain in left toe(s): Secondary | ICD-10-CM

## 2020-10-15 DIAGNOSIS — D500 Iron deficiency anemia secondary to blood loss (chronic): Secondary | ICD-10-CM

## 2020-10-15 NOTE — Progress Notes (Signed)
This patient returns to my office for at risk foot care.  This patient requires this care by a professional since this patient will be at risk due to having diabetes and CKD. This patient is unable to cut nails herself since the patient cannot reach her nails.These nails are painful walking and wearing shoes.  This patient presents for at risk foot care today.  General Appearance  Alert, conversant and in no acute stress.  Vascular  Dorsalis pedis and posterior tibial  pulses are palpable  bilaterally.  Capillary return is within normal limits  bilaterally. Temperature is within normal limits  bilaterally.  Neurologic  Senn-Weinstein monofilament wire test within normal limits  bilaterally. Muscle power within normal limits bilaterally.  Nails Thick disfigured discolored nails with subungual debris  from hallux to fifth toes bilaterally. No evidence of bacterial infection or drainage bilaterally.  Orthopedic  No limitations of motion  feet .  No crepitus or effusions noted.  No bony pathology or digital deformities noted.  Skin  normotropic skin with no porokeratosis noted bilaterally.  No signs of infections or ulcers noted.     Onychomycosis  Pain in right toes  Pain in left toes  Consent was obtained for treatment procedures.   Mechanical debridement of nails 1-5  bilaterally performed with a nail nipper.  Filed with dremel without incident.    Return office visit    4 months                  Told patient to return for periodic foot care and evaluation due to potential at risk complications.   Calhoun Reichardt DPM   

## 2020-10-16 ENCOUNTER — Inpatient Hospital Stay (HOSPITAL_COMMUNITY): Payer: Medicare Other | Attending: Hematology

## 2020-10-16 DIAGNOSIS — D500 Iron deficiency anemia secondary to blood loss (chronic): Secondary | ICD-10-CM

## 2020-10-16 DIAGNOSIS — Z820 Family history of epilepsy and other diseases of the nervous system: Secondary | ICD-10-CM | POA: Insufficient documentation

## 2020-10-16 DIAGNOSIS — Z841 Family history of disorders of kidney and ureter: Secondary | ICD-10-CM | POA: Insufficient documentation

## 2020-10-16 DIAGNOSIS — Z832 Family history of diseases of the blood and blood-forming organs and certain disorders involving the immune mechanism: Secondary | ICD-10-CM | POA: Insufficient documentation

## 2020-10-16 DIAGNOSIS — G8929 Other chronic pain: Secondary | ICD-10-CM | POA: Diagnosis not present

## 2020-10-16 DIAGNOSIS — Z809 Family history of malignant neoplasm, unspecified: Secondary | ICD-10-CM | POA: Diagnosis not present

## 2020-10-16 DIAGNOSIS — Z79899 Other long term (current) drug therapy: Secondary | ICD-10-CM | POA: Diagnosis not present

## 2020-10-16 DIAGNOSIS — D649 Anemia, unspecified: Secondary | ICD-10-CM | POA: Insufficient documentation

## 2020-10-16 DIAGNOSIS — Z8249 Family history of ischemic heart disease and other diseases of the circulatory system: Secondary | ICD-10-CM | POA: Insufficient documentation

## 2020-10-16 DIAGNOSIS — D472 Monoclonal gammopathy: Secondary | ICD-10-CM | POA: Insufficient documentation

## 2020-10-16 DIAGNOSIS — D631 Anemia in chronic kidney disease: Secondary | ICD-10-CM

## 2020-10-16 DIAGNOSIS — Z803 Family history of malignant neoplasm of breast: Secondary | ICD-10-CM | POA: Diagnosis not present

## 2020-10-16 DIAGNOSIS — Z833 Family history of diabetes mellitus: Secondary | ICD-10-CM | POA: Insufficient documentation

## 2020-10-16 DIAGNOSIS — R5383 Other fatigue: Secondary | ICD-10-CM | POA: Insufficient documentation

## 2020-10-16 DIAGNOSIS — M549 Dorsalgia, unspecified: Secondary | ICD-10-CM | POA: Insufficient documentation

## 2020-10-16 DIAGNOSIS — N183 Chronic kidney disease, stage 3 unspecified: Secondary | ICD-10-CM | POA: Diagnosis not present

## 2020-10-16 DIAGNOSIS — N281 Cyst of kidney, acquired: Secondary | ICD-10-CM | POA: Insufficient documentation

## 2020-10-16 DIAGNOSIS — E785 Hyperlipidemia, unspecified: Secondary | ICD-10-CM | POA: Insufficient documentation

## 2020-10-16 DIAGNOSIS — E669 Obesity, unspecified: Secondary | ICD-10-CM | POA: Insufficient documentation

## 2020-10-16 DIAGNOSIS — N1832 Chronic kidney disease, stage 3b: Secondary | ICD-10-CM

## 2020-10-16 LAB — COMPREHENSIVE METABOLIC PANEL
ALT: 21 U/L (ref 0–44)
AST: 26 U/L (ref 15–41)
Albumin: 3.8 g/dL (ref 3.5–5.0)
Alkaline Phosphatase: 66 U/L (ref 38–126)
Anion gap: 7 (ref 5–15)
BUN: 26 mg/dL — ABNORMAL HIGH (ref 8–23)
CO2: 24 mmol/L (ref 22–32)
Calcium: 9.3 mg/dL (ref 8.9–10.3)
Chloride: 105 mmol/L (ref 98–111)
Creatinine, Ser: 1.72 mg/dL — ABNORMAL HIGH (ref 0.44–1.00)
GFR, Estimated: 30 mL/min — ABNORMAL LOW (ref >60)
Glucose, Bld: 96 mg/dL (ref 70–99)
Potassium: 3.4 mmol/L — ABNORMAL LOW (ref 3.5–5.1)
Sodium: 136 mmol/L (ref 135–145)
Total Bilirubin: 0.5 mg/dL (ref 0.3–1.2)
Total Protein: 9.2 g/dL — ABNORMAL HIGH (ref 6.5–8.1)

## 2020-10-16 LAB — FERRITIN: Ferritin: 240 ng/mL (ref 11–307)

## 2020-10-16 LAB — CBC WITH DIFFERENTIAL/PLATELET
Abs Immature Granulocytes: 0.01 10*3/uL (ref 0.00–0.07)
Basophils Absolute: 0 10*3/uL (ref 0.0–0.1)
Basophils Relative: 1 %
Eosinophils Absolute: 0.2 10*3/uL (ref 0.0–0.5)
Eosinophils Relative: 5 %
HCT: 37.2 % (ref 36.0–46.0)
Hemoglobin: 11.7 g/dL — ABNORMAL LOW (ref 12.0–15.0)
Immature Granulocytes: 0 %
Lymphocytes Relative: 32 %
Lymphs Abs: 1.4 10*3/uL (ref 0.7–4.0)
MCH: 27.9 pg (ref 26.0–34.0)
MCHC: 31.5 g/dL (ref 30.0–36.0)
MCV: 88.8 fL (ref 80.0–100.0)
Monocytes Absolute: 0.4 10*3/uL (ref 0.1–1.0)
Monocytes Relative: 8 %
Neutro Abs: 2.4 10*3/uL (ref 1.7–7.7)
Neutrophils Relative %: 54 %
Platelets: 208 10*3/uL (ref 150–400)
RBC: 4.19 MIL/uL (ref 3.87–5.11)
RDW: 13 % (ref 11.5–15.5)
WBC: 4.4 10*3/uL (ref 4.0–10.5)
nRBC: 0 % (ref 0.0–0.2)

## 2020-10-16 LAB — IRON AND TIBC
Iron: 91 ug/dL (ref 28–170)
Saturation Ratios: 31 % (ref 10.4–31.8)
TIBC: 292 ug/dL (ref 250–450)
UIBC: 201 ug/dL

## 2020-10-16 LAB — LACTATE DEHYDROGENASE: LDH: 142 U/L (ref 98–192)

## 2020-10-17 LAB — KAPPA/LAMBDA LIGHT CHAINS
Kappa free light chain: 238.4 mg/L — ABNORMAL HIGH (ref 3.3–19.4)
Kappa, lambda light chain ratio: 13.86 — ABNORMAL HIGH (ref 0.26–1.65)
Lambda free light chains: 17.2 mg/L (ref 5.7–26.3)

## 2020-10-19 LAB — PROTEIN ELECTROPHORESIS, SERUM
A/G Ratio: 0.8 (ref 0.7–1.7)
Albumin ELP: 3.8 g/dL (ref 2.9–4.4)
Alpha-1-Globulin: 0.3 g/dL (ref 0.0–0.4)
Alpha-2-Globulin: 0.9 g/dL (ref 0.4–1.0)
Beta Globulin: 1 g/dL (ref 0.7–1.3)
Gamma Globulin: 2.4 g/dL — ABNORMAL HIGH (ref 0.4–1.8)
Globulin, Total: 4.7 g/dL — ABNORMAL HIGH (ref 2.2–3.9)
M-Spike, %: 2.2 g/dL — ABNORMAL HIGH
Total Protein ELP: 8.5 g/dL (ref 6.0–8.5)

## 2020-10-20 LAB — IMMUNOFIXATION ELECTROPHORESIS
IgA: 85 mg/dL (ref 64–422)
IgG (Immunoglobin G), Serum: 3024 mg/dL — ABNORMAL HIGH (ref 586–1602)
IgM (Immunoglobulin M), Srm: 45 mg/dL (ref 26–217)
Total Protein ELP: 8.5 g/dL (ref 6.0–8.5)

## 2020-10-23 ENCOUNTER — Other Ambulatory Visit: Payer: Self-pay

## 2020-10-23 ENCOUNTER — Inpatient Hospital Stay (HOSPITAL_COMMUNITY): Payer: Medicare Other | Admitting: Physician Assistant

## 2020-10-23 VITALS — BP 148/80 | HR 93 | Temp 97.2°F | Resp 18 | Wt 195.5 lb

## 2020-10-23 DIAGNOSIS — R5383 Other fatigue: Secondary | ICD-10-CM | POA: Diagnosis not present

## 2020-10-23 DIAGNOSIS — N1832 Chronic kidney disease, stage 3b: Secondary | ICD-10-CM | POA: Diagnosis not present

## 2020-10-23 DIAGNOSIS — Z809 Family history of malignant neoplasm, unspecified: Secondary | ICD-10-CM | POA: Diagnosis not present

## 2020-10-23 DIAGNOSIS — Z833 Family history of diabetes mellitus: Secondary | ICD-10-CM | POA: Diagnosis not present

## 2020-10-23 DIAGNOSIS — M549 Dorsalgia, unspecified: Secondary | ICD-10-CM | POA: Diagnosis not present

## 2020-10-23 DIAGNOSIS — Z832 Family history of diseases of the blood and blood-forming organs and certain disorders involving the immune mechanism: Secondary | ICD-10-CM | POA: Diagnosis not present

## 2020-10-23 DIAGNOSIS — D631 Anemia in chronic kidney disease: Secondary | ICD-10-CM | POA: Diagnosis not present

## 2020-10-23 DIAGNOSIS — D472 Monoclonal gammopathy: Secondary | ICD-10-CM

## 2020-10-23 DIAGNOSIS — Z803 Family history of malignant neoplasm of breast: Secondary | ICD-10-CM | POA: Diagnosis not present

## 2020-10-23 DIAGNOSIS — N281 Cyst of kidney, acquired: Secondary | ICD-10-CM | POA: Diagnosis not present

## 2020-10-23 DIAGNOSIS — D500 Iron deficiency anemia secondary to blood loss (chronic): Secondary | ICD-10-CM | POA: Diagnosis not present

## 2020-10-23 DIAGNOSIS — E785 Hyperlipidemia, unspecified: Secondary | ICD-10-CM | POA: Diagnosis not present

## 2020-10-23 DIAGNOSIS — G8929 Other chronic pain: Secondary | ICD-10-CM | POA: Diagnosis not present

## 2020-10-23 DIAGNOSIS — N183 Chronic kidney disease, stage 3 unspecified: Secondary | ICD-10-CM | POA: Diagnosis not present

## 2020-10-23 DIAGNOSIS — D649 Anemia, unspecified: Secondary | ICD-10-CM | POA: Diagnosis not present

## 2020-10-23 DIAGNOSIS — Z841 Family history of disorders of kidney and ureter: Secondary | ICD-10-CM | POA: Diagnosis not present

## 2020-10-23 DIAGNOSIS — Z79899 Other long term (current) drug therapy: Secondary | ICD-10-CM | POA: Diagnosis not present

## 2020-10-23 DIAGNOSIS — Z8249 Family history of ischemic heart disease and other diseases of the circulatory system: Secondary | ICD-10-CM | POA: Diagnosis not present

## 2020-10-23 NOTE — Patient Instructions (Signed)
[place] at [facility] Discharge Instructions  You were seen today by Tarri Abernethy PA-C for your MGUS (abnormal protein) and anemia.  All of your labs are stable, so we will repeat tests and see you again in 5-6 months.    LABS: Return in 5 months for labs.   OTHER TESTS: Whole-body Xray in 5 months  MEDICATIONS: No changes  FOLLOW-UP APPOINTMENT: Office visit in 5 months   Thank you for choosing [place] at [facility] to provide your oncology and hematology care.  To afford each patient quality time with our provider, please arrive at least 15 minutes before your scheduled appointment time.   If you have a lab appointment with the [facility] please come in thru the Main Entrance and check in at the main information desk.  You need to re-schedule your appointment should you arrive 10 or more minutes late.  We strive to give you quality time with our providers, and arriving late affects you and other patients whose appointments are after yours.  Also, if you no show three or more times for appointments you may be dismissed from the clinic at the providers discretion.     Again, thank you for choosing [facility].  Our hope is that these requests will decrease the amount of time that you wait before being seen by our physicians.       _____________________________________________________________  Should you have questions after your visit to [facility], please contact our office at [phone] and follow the prompts.  Our office hours are 8:00 a.m. and 4:30 p.m. Monday - Friday.  Please note that voicemails left after 4:00 p.m. may not be returned until the following business day.  We are closed weekends and major holidays.  You do have access to a nurse 24-7, just call the main number to the clinic [phone] and do not press any options, hold on the line and a nurse will answer the phone.    For  prescription refill requests, have your pharmacy contact our office and allow 72 hours.    Due to Covid, you will need to wear a mask upon entering the hospital. If you do not have a mask, a mask will be given to you at the Main Entrance upon arrival. For doctor visits, patients may have 1 support person age 43 or older with them. For treatment visits, patients can not have anyone with them due to social distancing guidelines and our immunocompromised population.

## 2020-10-23 NOTE — Progress Notes (Signed)
Renee Collins, [place] [id number]   CLINIC:  Medical Oncology/Hematology  PCP:  [name], MD [address] [place] [address] [phone]   REASON FOR VISIT:  Follow-up for MGUS and normocytic anemia  CURRENT THERAPY: Feraheme  INTERVAL HISTORY:  Renee Collins 80 y.o. 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  She was last seen by Dr. Delton Coombes on 02/28/2020.  Patient reports she has been doing well since her last visit.  Denies any recent fevers, infections, or hospitalizations.  She has some fatigue, rates her energy at 70%.  Her appetite is 100%.  She is maintaining a stable weight.  She complains of 5/10 chronic back pain today, but denies any new bone pains or recent fractures.  Patient's ROS is otherwise negative as noted below.  No new masses or adenopathy, no bright red blood per rectum or melena, no new neurologic symptoms or peripheral neuropathy.   REVIEW OF SYSTEMS:  Review of Systems  Constitutional: Positive for fatigue. Negative for appetite change, chills, diaphoresis, fever and unexpected weight change.  HENT:   Negative for lump/mass and nosebleeds.   Eyes: Negative for eye problems.  Respiratory: Negative for cough, hemoptysis and shortness of breath.   Cardiovascular: Negative for chest pain, leg swelling and palpitations.  Gastrointestinal: Negative for abdominal pain, blood in stool, constipation, diarrhea, nausea and vomiting.  Genitourinary: Negative for hematuria.   Musculoskeletal: Positive for back pain.  Skin: Negative.   Neurological: Negative for dizziness, headaches and light-headedness.  Hematological: Does not bruise/bleed easily.      PAST MEDICAL/SURGICAL HISTORY:  Past Medical History:  Diagnosis Date  . Anemia   . Chronic back pain   . Depression   . Diabetes mellitus   . GERD (gastroesophageal reflux disease)   . Hyperlipidemia   . Hypertension    . Hypothyroidism    Past Surgical History:  Procedure Laterality Date  . BONE MARROW ASPIRATION Left 05/16/15  . BONE MARROW BIOPSY Left 05/16/15  . COLONOSCOPY  03/30/2012   Procedure: COLONOSCOPY;  Surgeon: Rogene Houston, MD;  Location: [facility];  Service: Endoscopy;  Laterality: N/A;  730  . COLONOSCOPY N/A 06/30/2017   Procedure: COLONOSCOPY;  Surgeon: Rogene Houston, MD;  Location: [facility];  Service: Endoscopy;  Laterality: N/A;  2:00  . LAMINECTOMY  1980's  . POLYPECTOMY  06/30/2017   Procedure: POLYPECTOMY;  Surgeon: Rogene Houston, MD;  Location: [facility];  Service: Endoscopy;;  colon  . SPINE SURGERY  approx 1983   dr Joya Salm  . TOTAL ABDOMINAL HYSTERECTOMY  approx 1993   fibroids      SOCIAL HISTORY:  Social History   Socioeconomic History  . Marital status: Divorced    Spouse name: Not on file  . Number of children: 3  . Years of education: Not on file  . Highest education level: Not on file  Occupational History  . Occupation: LPN  Tobacco Use  . Smoking status: Never Smoker  . Smokeless tobacco: Never Used  Vaping Use  . Vaping Use: Never used  Substance and Sexual Activity  . Alcohol use: No  . Drug use: No  . Sexual activity: Not Currently  Other Topics Concern  . Not on file  Social History Narrative   Pt has 2 living adult children, 1 desease at age 41 secondary congential heart disease    Social Determinants of Health  Financial Resource Strain: Low Risk   . Difficulty of Paying Living Expenses: Not hard at all  Food Insecurity: No Food Insecurity  . Worried About Charity fundraiser in the Last Year: Never true  . Ran Out of Food in the Last Year: Never true  Transportation Needs: No Transportation Needs  . Lack of Transportation (Medical): No  . Lack of Transportation (Non-Medical): No  Physical Activity: Insufficiently Active  . Days of Exercise per Week: 3 days  . Minutes of Exercise per Session: 30 min  Stress: No  Stress Concern Present  . Feeling of Stress : Only a little  Social Connections: Moderately Integrated  . Frequency of Communication with Friends and Family: More than three times a week  . Frequency of Social Gatherings with Friends and Family: Once a week  . Attends Religious Services: More than 4 times per year  . Active Member of Clubs or Organizations: Yes  . Attends Archivist Meetings: More than 4 times per year  . Marital Status: Divorced  Human resources officer Violence: Not At Risk  . Fear of Current or Ex-Partner: No  . Emotionally Abused: No  . Physically Abused: No  . Sexually Abused: No    FAMILY HISTORY:  Family History  Problem Relation Age of Onset  . Heart attack Mother 39  . Heart disease Mother   . Cancer Mother        type unknown  . Diabetes Mother   . Kidney failure Father   . Kidney disease Father   . Myasthenia gravis Brother   . Diabetes Sister   . Breast cancer Sister 41  . Heart disease Brother   . Diabetes Sister   . Sickle cell anemia Sister   . Cancer Sister        unknown type    CURRENT MEDICATIONS:  Outpatient Encounter Medications as of 10/23/2020  Medication Sig  . acetaminophen (TYLENOL) 650 MG CR tablet Take 650 mg by mouth every 8 (eight) hours as needed for pain.   Marland Kitchen amLODipine (NORVASC) 10 MG tablet TAKE 1 TABLET BY MOUTH  DAILY  . aspirin 81 MG EC tablet Take 1 tablet (81 mg total) by mouth daily.  . benazepril (LOTENSIN) 5 MG tablet Take 5 mg by mouth daily.  Marland Kitchen buPROPion (WELLBUTRIN XL) 150 MG 24 hr tablet TAKE 1 TABLET BY MOUTH  DAILY  . Calcium-Magnesium-Vitamin D (CALCIUM 1200+D3 PO) Take 1 tablet by mouth 2 (two) times daily.  . diphenhydrAMINE (BENADRYL) 25 mg capsule Take 25 mg by mouth at bedtime as needed (for allergies/sleep.).   Marland Kitchen gabapentin (NEURONTIN) 100 MG capsule TAKE 1 TO 2 CAPSULES BY  MOUTH 3 TIMES DAILY  . hydrochlorothiazide (HYDRODIURIL) 25 MG tablet TAKE 1 TABLET BY MOUTH  DAILY  . levothyroxine  (SYNTHROID) 88 MCG tablet TAKE 1 TABLET BY MOUTH ONCE DAILY 1/2 HOUR BEFORE FIRST MEAL OF THE DAY. DRINK  WATER  . Multiple Vitamin (MULTIVITAMIN WITH MINERALS) TABS tablet Take 1 tablet by mouth daily.  Marland Kitchen omeprazole (PRILOSEC) 20 MG capsule Take 20 mg by mouth daily as needed (for acid reflux.).   Marland Kitchen pravastatin (PRAVACHOL) 20 MG tablet TAKE 1 TABLET BY MOUTH IN  THE EVENING   No facility-administered encounter medications on file as of 10/23/2020.    ALLERGIES:  No Known Allergies   PHYSICAL EXAM:  ECOG PERFORMANCE STATUS: 1 - Symptomatic but completely ambulatory  Vitals:   10/23/20 1121  BP: (!) 148/80  Pulse: 93  Resp: 18  Temp: (!) 97.2 F (36.2 C)  SpO2: 99%   Filed Weights   10/23/20 1121  Weight: 195 lb 8.8 oz (88.7 kg)   Physical Exam Constitutional:      Appearance: Normal appearance. She is obese.  HENT:     Head: Normocephalic and atraumatic.     Mouth/Throat:     Mouth: Mucous membranes are moist.  Eyes:     Extraocular Movements: Extraocular movements intact.     Pupils: Pupils are equal, round, and reactive to light.  Cardiovascular:     Rate and Rhythm: Normal rate and regular rhythm.     Pulses: Normal pulses.     Heart sounds: Normal heart sounds.  Pulmonary:     Effort: Pulmonary effort is normal.     Breath sounds: Normal breath sounds.  Abdominal:     General: Bowel sounds are normal.     Palpations: Abdomen is soft.     Tenderness: There is no abdominal tenderness.  Musculoskeletal:        General: No swelling.     Right lower leg: No edema.     Left lower leg: No edema.  Lymphadenopathy:     Cervical: No cervical adenopathy.  Skin:    General: Skin is warm and dry.  Neurological:     General: No focal deficit present.     Mental Status: She is alert and oriented to person, place, and time.  Psychiatric:        Mood and Affect: Mood normal.        Behavior: Behavior normal.      LABORATORY DATA:  I have reviewed the labs as  listed.  CBC    Component Value Date/Time   WBC 4.4 10/16/2020 1236   RBC 4.19 10/16/2020 1236   HGB 11.7 (L) 10/16/2020 1236   HCT 37.2 10/16/2020 1236   PLT 208 10/16/2020 1236   MCV 88.8 10/16/2020 1236   MCH 27.9 10/16/2020 1236   MCHC 31.5 10/16/2020 1236   RDW 13.0 10/16/2020 1236   LYMPHSABS 1.4 10/16/2020 1236   MONOABS 0.4 10/16/2020 1236   EOSABS 0.2 10/16/2020 1236   BASOSABS 0.0 10/16/2020 1236   CMP Latest Ref Rng & Units 10/16/2020 06/04/2020 01/29/2020  Glucose 70 - 99 mg/dL 96 113(H) 134(H)  BUN 8 - 23 mg/dL 26(H) 17 31(H)  Creatinine 0.44 - 1.00 mg/dL 1.72(H) 1.69(H) 1.93(H)  Sodium 135 - 145 mmol/L 136 139 136  Potassium 3.5 - 5.1 mmol/L 3.4(L) 4.3 3.6  Chloride 98 - 111 mmol/L 105 102 110  CO2 22 - 32 mmol/L 24 23 18(L)  Calcium 8.9 - 10.3 mg/dL 9.3 9.7 9.1  Total Protein 6.5 - 8.1 g/dL 9.2(H) 8.9(H) 9.3(H)  Total Bilirubin 0.3 - 1.2 mg/dL 0.5 0.5 0.4  Alkaline Phos 38 - 126 U/L 66 79 68  AST 15 - 41 U/L _0 ALT 0 - 44 U/L _1 DIAGNOSTIC IMAGING:  I have independently reviewed the relevant imaging and discussed with the patient.  ASSESSMENT: 1. IgG kappa MGUS: -Bone marrow biopsy on 05/16/2015 shows limited bone marrow sample with no increase in plasma cells in the very limited material present. Cytogenetics-71, XX. FISH panel was normal. -Bone survey on 02/22/2017 showed ill-defined calcified lesion in the distal right femur. MRI of the right femur on 03/02/2017 showed no osseous lesion. -Skeletal survey on 02/06/2019 did not show any lytic lesions. -PET scan on 02/25/2020 was negative for  any myeloma lesions or other malignancies. - Labs on 10/16/2020 show IFE with IgG kappa, M spike stable at 2.2, and free light chains are stable compared to previous (elevated kappa 238.4, normal lambda 17.2, elevated ratio 13.86) - Creatinine stable at 1.72, hemoglobin stable at 11.7, calcium normal at 9.3  2. Normocytic anemia: -This is from chronic  kidney disease. Hemoglobin is 11.7 -Ferritin was 240 and percent saturation is 31%. V03 and folic acid were normal. -Denies any bleeding per rectum or melena. We will plan to repeat her labs along with ferritin and iron panel prior to next visit.  3. CKD stage III/IV: -Creatinine has been stable between 1.5 and 1.8 - Renal ultrasound on 01/24/2020 shows small right renal cyst with no additional findings.   PLAN:  1. IgG kappa MGUS: - MGUS labs are stable at this visit - We will repeat MGUS panel as well as skeletal survey 5 months - RTC in 5 months  2. Normocytic anemia: - Iron levels normal, no need for intervention at this time-hemoglobin improved from previous -Energy levels have improved.  We will repeat her ferritin and iron panel in 5 months.  3. CKD: -Creatinine was 1.72 on 10/16/2020.   PLAN SUMMARY & DISPOSITION: - Labs and skeletal survey with RTC in 5 months  All questions were answered. The patient knows to call the clinic with any problems, questions or concerns.  Medical decision making: Low  Time spent on visit: I spent 20 minutes counseling the patient face to face. The total time spent in the appointment was 30 minutes and more than 50% was on counseling.   Harriett Rush, PA-C  10/23/20 11:58 AM

## 2020-11-05 DIAGNOSIS — Z20822 Contact with and (suspected) exposure to covid-19: Secondary | ICD-10-CM | POA: Diagnosis not present

## 2020-11-14 DIAGNOSIS — E1122 Type 2 diabetes mellitus with diabetic chronic kidney disease: Secondary | ICD-10-CM | POA: Diagnosis not present

## 2020-11-14 DIAGNOSIS — R809 Proteinuria, unspecified: Secondary | ICD-10-CM | POA: Diagnosis not present

## 2020-11-14 DIAGNOSIS — E1129 Type 2 diabetes mellitus with other diabetic kidney complication: Secondary | ICD-10-CM | POA: Diagnosis not present

## 2020-11-14 DIAGNOSIS — N189 Chronic kidney disease, unspecified: Secondary | ICD-10-CM | POA: Diagnosis not present

## 2020-11-14 DIAGNOSIS — I129 Hypertensive chronic kidney disease with stage 1 through stage 4 chronic kidney disease, or unspecified chronic kidney disease: Secondary | ICD-10-CM | POA: Diagnosis not present

## 2020-11-20 DIAGNOSIS — I129 Hypertensive chronic kidney disease with stage 1 through stage 4 chronic kidney disease, or unspecified chronic kidney disease: Secondary | ICD-10-CM | POA: Diagnosis not present

## 2020-11-20 DIAGNOSIS — E1122 Type 2 diabetes mellitus with diabetic chronic kidney disease: Secondary | ICD-10-CM | POA: Diagnosis not present

## 2020-11-20 DIAGNOSIS — E1129 Type 2 diabetes mellitus with other diabetic kidney complication: Secondary | ICD-10-CM | POA: Diagnosis not present

## 2020-11-20 DIAGNOSIS — D638 Anemia in other chronic diseases classified elsewhere: Secondary | ICD-10-CM | POA: Diagnosis not present

## 2020-11-20 DIAGNOSIS — N189 Chronic kidney disease, unspecified: Secondary | ICD-10-CM | POA: Diagnosis not present

## 2020-11-20 DIAGNOSIS — R809 Proteinuria, unspecified: Secondary | ICD-10-CM | POA: Diagnosis not present

## 2020-11-20 DIAGNOSIS — E211 Secondary hyperparathyroidism, not elsewhere classified: Secondary | ICD-10-CM | POA: Diagnosis not present

## 2020-12-04 DIAGNOSIS — E119 Type 2 diabetes mellitus without complications: Secondary | ICD-10-CM | POA: Diagnosis not present

## 2021-01-02 DIAGNOSIS — E785 Hyperlipidemia, unspecified: Secondary | ICD-10-CM | POA: Diagnosis not present

## 2021-01-02 DIAGNOSIS — E1149 Type 2 diabetes mellitus with other diabetic neurological complication: Secondary | ICD-10-CM | POA: Diagnosis not present

## 2021-01-02 DIAGNOSIS — D631 Anemia in chronic kidney disease: Secondary | ICD-10-CM | POA: Diagnosis not present

## 2021-01-02 DIAGNOSIS — N1832 Chronic kidney disease, stage 3b: Secondary | ICD-10-CM | POA: Diagnosis not present

## 2021-01-02 DIAGNOSIS — E039 Hypothyroidism, unspecified: Secondary | ICD-10-CM | POA: Diagnosis not present

## 2021-01-03 LAB — CMP14+EGFR
ALT: 17 IU/L (ref 0–32)
AST: 18 IU/L (ref 0–40)
Albumin/Globulin Ratio: 1 — ABNORMAL LOW (ref 1.2–2.2)
Albumin: 3.9 g/dL (ref 3.6–4.6)
Alkaline Phosphatase: 77 IU/L (ref 44–121)
BUN/Creatinine Ratio: 10 — ABNORMAL LOW (ref 12–28)
BUN: 16 mg/dL (ref 8–27)
Bilirubin Total: 0.4 mg/dL (ref 0.0–1.2)
CO2: 19 mmol/L — ABNORMAL LOW (ref 20–29)
Calcium: 9.4 mg/dL (ref 8.7–10.3)
Chloride: 106 mmol/L (ref 96–106)
Creatinine, Ser: 1.54 mg/dL — ABNORMAL HIGH (ref 0.57–1.00)
Globulin, Total: 3.8 g/dL (ref 1.5–4.5)
Glucose: 89 mg/dL (ref 65–99)
Potassium: 4.3 mmol/L (ref 3.5–5.2)
Sodium: 139 mmol/L (ref 134–144)
Total Protein: 7.7 g/dL (ref 6.0–8.5)
eGFR: 34 mL/min/{1.73_m2} — ABNORMAL LOW (ref >59)

## 2021-01-03 LAB — HEMOGLOBIN A1C
Est. average glucose Bld gHb Est-mCnc: 140 mg/dL
Hgb A1c MFr Bld: 6.5 % — ABNORMAL HIGH (ref 4.8–5.6)

## 2021-01-03 LAB — VITAMIN D 25 HYDROXY (VIT D DEFICIENCY, FRACTURES): Vit D, 25-Hydroxy: 44.6 ng/mL (ref 30.0–100.0)

## 2021-01-03 LAB — LIPID PANEL
Chol/HDL Ratio: 2.5 ratio (ref 0.0–4.4)
Cholesterol, Total: 142 mg/dL (ref 100–199)
HDL: 56 mg/dL (ref >39)
LDL Chol Calc (NIH): 74 mg/dL (ref 0–99)
Triglycerides: 57 mg/dL (ref 0–149)
VLDL Cholesterol Cal: 12 mg/dL (ref 5–40)

## 2021-01-03 LAB — TSH: TSH: 2.3 u[IU]/mL (ref 0.450–4.500)

## 2021-01-12 ENCOUNTER — Encounter: Payer: Medicare Other | Admitting: Family Medicine

## 2021-01-13 ENCOUNTER — Telehealth: Payer: Self-pay | Admitting: Podiatry

## 2021-01-13 ENCOUNTER — Encounter: Payer: Self-pay | Admitting: Podiatry

## 2021-01-13 NOTE — Telephone Encounter (Signed)
Called patient to reschedule 8/24 appt. Patient stated that she will call back. Also sent reschedule letter.

## 2021-01-16 ENCOUNTER — Other Ambulatory Visit: Payer: Self-pay

## 2021-01-16 ENCOUNTER — Ambulatory Visit (INDEPENDENT_AMBULATORY_CARE_PROVIDER_SITE_OTHER): Payer: Medicare Other | Admitting: Family Medicine

## 2021-01-16 ENCOUNTER — Encounter: Payer: Self-pay | Admitting: Family Medicine

## 2021-01-16 VITALS — BP 130/79 | HR 94 | Temp 98.8°F | Resp 20 | Ht 66.0 in | Wt 194.0 lb

## 2021-01-16 DIAGNOSIS — D472 Monoclonal gammopathy: Secondary | ICD-10-CM

## 2021-01-16 DIAGNOSIS — Z1231 Encounter for screening mammogram for malignant neoplasm of breast: Secondary | ICD-10-CM | POA: Diagnosis not present

## 2021-01-16 DIAGNOSIS — E1129 Type 2 diabetes mellitus with other diabetic kidney complication: Secondary | ICD-10-CM | POA: Diagnosis not present

## 2021-01-16 DIAGNOSIS — E041 Nontoxic single thyroid nodule: Secondary | ICD-10-CM

## 2021-01-16 DIAGNOSIS — E1149 Type 2 diabetes mellitus with other diabetic neurological complication: Secondary | ICD-10-CM | POA: Diagnosis not present

## 2021-01-16 DIAGNOSIS — Z0000 Encounter for general adult medical examination without abnormal findings: Secondary | ICD-10-CM

## 2021-01-16 DIAGNOSIS — Z1211 Encounter for screening for malignant neoplasm of colon: Secondary | ICD-10-CM

## 2021-01-16 DIAGNOSIS — E6609 Other obesity due to excess calories: Secondary | ICD-10-CM

## 2021-01-16 DIAGNOSIS — M5431 Sciatica, right side: Secondary | ICD-10-CM | POA: Diagnosis not present

## 2021-01-16 DIAGNOSIS — N189 Chronic kidney disease, unspecified: Secondary | ICD-10-CM | POA: Diagnosis not present

## 2021-01-16 DIAGNOSIS — I129 Hypertensive chronic kidney disease with stage 1 through stage 4 chronic kidney disease, or unspecified chronic kidney disease: Secondary | ICD-10-CM | POA: Diagnosis not present

## 2021-01-16 DIAGNOSIS — R809 Proteinuria, unspecified: Secondary | ICD-10-CM | POA: Diagnosis not present

## 2021-01-16 DIAGNOSIS — I10 Essential (primary) hypertension: Secondary | ICD-10-CM

## 2021-01-16 DIAGNOSIS — Z6831 Body mass index (BMI) 31.0-31.9, adult: Secondary | ICD-10-CM

## 2021-01-16 DIAGNOSIS — F324 Major depressive disorder, single episode, in partial remission: Secondary | ICD-10-CM

## 2021-01-16 DIAGNOSIS — E1122 Type 2 diabetes mellitus with diabetic chronic kidney disease: Secondary | ICD-10-CM | POA: Diagnosis not present

## 2021-01-16 NOTE — Assessment & Plan Note (Signed)

## 2021-01-16 NOTE — Patient Instructions (Addendum)
F/U in mid to end  December, call if you need me before  Call and Come for flu vac in early September  please  Please schedule mammogram at checkout  Tsh 3 to 5 days before next visit  Nurse please send for eye exam June , 2022, at [facility] [place] and document  result  It is important that you exercise regularly at least 30 minutes 5 times a week. If you develop chest pain, have severe difficulty breathing, or feel very tired, stop exercising immediately and seek medical attention  Thanks for choosing [facility], we consider it a privelige to serve you.  Thanks for choosing [facility], we consider it a privelige to serve you.

## 2021-01-16 NOTE — Progress Notes (Addendum)
Please arfrange    Renee Collins     MRN: [id number]      DOB: 1940-09-23  [id number] f/u. t. Recent labs,  are reviewed. Immunization is reviewed , and  updated if needed.   PE: BP 130/79 (BP Location: Right Arm, Patient Position: Sitting, Cuff Size: Large)   Pulse 94   Temp 98.8 F (37.1 C)   Resp 20   Ht 5\' 6"  (1.676 m)   Wt 194 lb (88 kg)   SpO2 97%   BMI 31.31 kg/m   Pleasant  female, alert and oriented x 3, in no cardio-pulmonary distress. Afebrile. HEENT No facial trauma or asymetry. Sinuses non tender.  Extra occullar muscles intact.. External ears normal, . Neck: supple, no adenopathy,JVD or thyromegaly.No bruits.  Chest: Clear to ascultation bilaterally.No crackles or wheezes. Non tender to palpation  Breast: No asymetry,no masses or lumps. No tenderness. No nipple discharge or inversion. No axillary or supraclavicular adenopathy  Cardiovascular system; Heart sounds normal,  S1 and  S2 ,no S3.  No murmur, or thrill. Apical beat not displaced Peripheral pulses normal.  Abdomen: Soft, non tender, no organomegaly or masses. No bruits. Bowel sounds normal. No guarding, tenderness or rebound.    Musculoskeletal exam: Full  though mildly reduced ROM of spine, hips , shoulders and knees. No deformity ,swelling or crepitus noted. No muscle wasting or atrophy.   Neurologic: Cranial nerves 2 to 12 intact. Power, tone ,sensation and reflexes normal throughout. No disturbance in gait. No tremor.  Skin: Intact, no ulceration, erythema , scaling or rash noted. Pigmentation normal throughout  Psych; Normal mood and affect. Judgement and concentration normal   Assessment & Plan:   Annual physical exam Annual exam as documented. Counseling done  re healthy lifestyle involving commitment to 150 minutes exercise per week, heart healthy diet, and attaining healthy weight.The importance of adequate sleep also discussed. Regular seat belt use and home  safety, is also discussed. Changes in health habits are decided on by the patient with goals and time frames  set for achieving them. Immunization and cancer screening needs are specifically addressed at this visit.   Essential hypertension Controlled, no change in medication DASH diet and commitment to daily physical activity for a minimum of 30 minutes discussed and encouraged, as a part of hypertension management. The importance of attaining a healthy weight is also discussed.  BP/Weight 01/16/2021 10/23/2020 10/06/2020 08/12/2020 08/11/2020 07/08/2020 99/07/4266  Systolic BP 341 962 229 798 921 194 174  Diastolic BP 79 80 80 83 80 63 78  Wt. (Lbs) 194 195.55 195 - 196.4 192 195  BMI 31.31 31.56 31.47 - 31.7 30.99 31.47       Type 2 diabetes mellitus ([place]) Renee Collins is reminded of the importance of commitment to daily physical activity for 30 minutes or more, as able and the need to limit carbohydrate intake to 30 to 60 grams per meal to help with blood sugar control.    Renee Collins is reminded of the importance of daily foot exam, annual eye examination, and good blood sugar, blood pressure and cholesterol control.  Diabetic Labs Latest Ref Rng & Units 01/02/2021 10/16/2020 06/04/2020 03/25/2020 01/29/2020  HbA1c 4.8 - 5.6 % 6.5(H) - 6.5(H) - -  Microalbumin Not Estab. ug/mL - - - 433.8(H) -  Micro/Creat Ratio 0 - 29 mg/g creat - - - - -  Chol 100 - 199 mg/dL 142 - 154 - -  HDL >39 mg/dL 56 - 59 - -  Calc LDL 0 - 99 mg/dL 74 - 81 - -  Triglycerides 0 - 149 mg/dL 57 - 75 - -  Creatinine 0.57 - 1.00 mg/dL 1.54(H) 1.72(H) 1.69(H) - 1.93(H)   BP/Weight 01/16/2021 10/23/2020 10/06/2020 08/12/2020 08/11/2020 07/08/2020 03/29/7252  Systolic BP 664 403 474 259 563 875 643  Diastolic BP 79 80 80 83 80 63 78  Wt. (Lbs) 194 195.55 195 - 196.4 192 195  BMI 31.31 31.56 31.47 - 31.7 30.99 31.47   Foot/eye exam completion dates Latest Ref Rng & Units 01/16/2021 11/27/2019  Eye Exam No Retinopathy - -  Foot Form  Completion - Done Done        Obesity, unspecified  Patient re-educated about  the importance of commitment to a  minimum of 150 minutes of exercise per week as able.  The importance of healthy food choices with portion control discussed, as well as eating regularly and within a 12 hour window most days. The need to choose "clean , green" food 50 to 75% of the time is discussed, as well as to make water the primary drink and set a goal of 64 ounces water daily.    Weight /BMI 01/16/2021 10/23/2020 10/06/2020  WEIGHT 194 lb 195 lb 8.8 oz 195 lb  HEIGHT 5\' 6"  - 5\' 6"   BMI 31.31 kg/m2 31.56 kg/m2 31.47 kg/m2      Sciatica of right side No current flare  Depression, major, single episode, in partial remission (HCC) Controlled, no change in medication   Monoclonal gammopathy Followed by Oncology

## 2021-01-20 ENCOUNTER — Other Ambulatory Visit: Payer: Self-pay | Admitting: Family Medicine

## 2021-01-21 DIAGNOSIS — N189 Chronic kidney disease, unspecified: Secondary | ICD-10-CM | POA: Diagnosis not present

## 2021-01-21 DIAGNOSIS — E211 Secondary hyperparathyroidism, not elsewhere classified: Secondary | ICD-10-CM | POA: Diagnosis not present

## 2021-01-21 DIAGNOSIS — D638 Anemia in other chronic diseases classified elsewhere: Secondary | ICD-10-CM | POA: Diagnosis not present

## 2021-01-21 DIAGNOSIS — R809 Proteinuria, unspecified: Secondary | ICD-10-CM | POA: Diagnosis not present

## 2021-01-21 DIAGNOSIS — I129 Hypertensive chronic kidney disease with stage 1 through stage 4 chronic kidney disease, or unspecified chronic kidney disease: Secondary | ICD-10-CM | POA: Diagnosis not present

## 2021-01-21 DIAGNOSIS — E1122 Type 2 diabetes mellitus with diabetic chronic kidney disease: Secondary | ICD-10-CM | POA: Diagnosis not present

## 2021-01-21 DIAGNOSIS — E1129 Type 2 diabetes mellitus with other diabetic kidney complication: Secondary | ICD-10-CM | POA: Diagnosis not present

## 2021-01-30 LAB — COLOGUARD

## 2021-02-09 DIAGNOSIS — Z1211 Encounter for screening for malignant neoplasm of colon: Secondary | ICD-10-CM | POA: Diagnosis not present

## 2021-02-13 LAB — COLOGUARD: Cologuard: NEGATIVE

## 2021-02-18 ENCOUNTER — Ambulatory Visit: Payer: Medicare Other | Admitting: Podiatry

## 2021-02-26 ENCOUNTER — Encounter: Payer: Self-pay | Admitting: Family Medicine

## 2021-03-01 NOTE — Assessment & Plan Note (Signed)
Controlled, no change in medication DASH diet and commitment to daily physical activity for a minimum of 30 minutes discussed and encouraged, as a part of hypertension management. The importance of attaining a healthy weight is also discussed.  BP/Weight 01/16/2021 10/23/2020 10/06/2020 08/12/2020 08/11/2020 07/08/2020 28/11/7517  Systolic BP 824 299 806 999 672 277 375  Diastolic BP 79 80 80 83 80 63 78  Wt. (Lbs) 194 195.55 195 - 196.4 192 195  BMI 31.31 31.56 31.47 - 31.7 30.99 31.47

## 2021-03-01 NOTE — Assessment & Plan Note (Addendum)
Renee Collins is reminded of the importance of commitment to daily physical activity for 30 minutes or more, as able and the need to limit carbohydrate intake to 30 to 60 grams per meal to help with blood sugar control.    Renee Collins is reminded of the importance of daily foot exam, annual eye examination, and good blood sugar, blood pressure and cholesterol control.  Diabetic Labs Latest Ref Rng & Units 01/02/2021 10/16/2020 06/04/2020 03/25/2020 01/29/2020  HbA1c 4.8 - 5.6 % 6.5(H) - 6.5(H) - -  Microalbumin Not Estab. ug/mL - - - 433.8(H) -  Micro/Creat Ratio 0 - 29 mg/g creat - - - - -  Chol 100 - 199 mg/dL 142 - 154 - -  HDL >39 mg/dL 56 - 59 - -  Calc LDL 0 - 99 mg/dL 74 - 81 - -  Triglycerides 0 - 149 mg/dL 57 - 75 - -  Creatinine 0.57 - 1.00 mg/dL 1.54(H) 1.72(H) 1.69(H) - 1.93(H)   BP/Weight 01/16/2021 10/23/2020 10/06/2020 08/12/2020 08/11/2020 07/08/2020 01/0/9323  Systolic BP 557 322 025 427 062 376 283  Diastolic BP 79 80 80 83 80 63 78  Wt. (Lbs) 194 195.55 195 - 196.4 192 195  BMI 31.31 31.56 31.47 - 31.7 30.99 31.47   Foot/eye exam completion dates Latest Ref Rng & Units 01/16/2021 11/27/2019  Eye Exam No Retinopathy - -  Foot Form Completion - Done Done

## 2021-03-01 NOTE — Addendum Note (Signed)
Addended by: [name] on: 03/01/2021 09:52 PM   Modules accepted: Level of Service

## 2021-03-01 NOTE — Assessment & Plan Note (Signed)
Controlled, no change in medication  

## 2021-03-01 NOTE — Assessment & Plan Note (Signed)
No current flare 

## 2021-03-01 NOTE — Assessment & Plan Note (Signed)
Followed by Oncology

## 2021-03-01 NOTE — Assessment & Plan Note (Signed)
  Patient re-educated about  the importance of commitment to a  minimum of 150 minutes of exercise per week as able.  The importance of healthy food choices with portion control discussed, as well as eating regularly and within a 12 hour window most days. The need to choose "clean , green" food 50 to 75% of the time is discussed, as well as to make water the primary drink and set a goal of 64 ounces water daily.    Weight /BMI 01/16/2021 10/23/2020 10/06/2020  WEIGHT 194 lb 195 lb 8.8 oz 195 lb  HEIGHT 5\' 6"  - 5\' 6"   BMI 31.31 kg/m2 31.56 kg/m2 31.47 kg/m2

## 2021-03-04 ENCOUNTER — Encounter: Payer: Self-pay | Admitting: Podiatry

## 2021-03-04 ENCOUNTER — Ambulatory Visit (INDEPENDENT_AMBULATORY_CARE_PROVIDER_SITE_OTHER): Payer: Medicare Other | Admitting: Podiatry

## 2021-03-04 ENCOUNTER — Other Ambulatory Visit: Payer: Self-pay

## 2021-03-04 DIAGNOSIS — M79675 Pain in left toe(s): Secondary | ICD-10-CM

## 2021-03-04 DIAGNOSIS — M79674 Pain in right toe(s): Secondary | ICD-10-CM

## 2021-03-04 DIAGNOSIS — N1832 Chronic kidney disease, stage 3b: Secondary | ICD-10-CM | POA: Diagnosis not present

## 2021-03-04 DIAGNOSIS — B351 Tinea unguium: Secondary | ICD-10-CM | POA: Diagnosis not present

## 2021-03-04 DIAGNOSIS — Z794 Long term (current) use of insulin: Secondary | ICD-10-CM | POA: Diagnosis not present

## 2021-03-04 DIAGNOSIS — E0822 Diabetes mellitus due to underlying condition with diabetic chronic kidney disease: Secondary | ICD-10-CM | POA: Diagnosis not present

## 2021-03-04 NOTE — Progress Notes (Signed)
This patient returns to my office for at risk foot care.  This patient requires this care by a professional since this patient will be at risk due to having diabetes and CKD. This patient is unable to cut nails herself since the patient cannot reach her nails.These nails are painful walking and wearing shoes.  This patient presents for at risk foot care today.  General Appearance  Alert, conversant and in no acute stress.  Vascular  Dorsalis pedis and posterior tibial  pulses are palpable  bilaterally.  Capillary return is within normal limits  bilaterally. Temperature is within normal limits  bilaterally.  Neurologic  Senn-Weinstein monofilament wire test within normal limits  bilaterally. Muscle power within normal limits bilaterally.  Nails Thick disfigured discolored nails with subungual debris  from hallux to fifth toes bilaterally. No evidence of bacterial infection or drainage bilaterally.  Orthopedic  No limitations of motion  feet .  No crepitus or effusions noted.  No bony pathology or digital deformities noted.  Skin  normotropic skin with no porokeratosis noted bilaterally.  No signs of infections or ulcers noted.     Onychomycosis  Pain in right toes  Pain in left toes  Consent was obtained for treatment procedures.   Mechanical debridement of nails 1-5  bilaterally performed with a nail nipper.  Filed with dremel without incident.    Return office visit    4 months                  Told patient to return for periodic foot care and evaluation due to potential at risk complications.   Donja Tipping DPM   

## 2021-03-18 ENCOUNTER — Other Ambulatory Visit: Payer: Self-pay

## 2021-03-18 ENCOUNTER — Inpatient Hospital Stay (HOSPITAL_COMMUNITY): Payer: Medicare Other | Attending: Hematology

## 2021-03-18 ENCOUNTER — Ambulatory Visit (HOSPITAL_COMMUNITY)
Admission: RE | Admit: 2021-03-18 | Discharge: 2021-03-18 | Disposition: A | Discharge status: Home / Self Care | Payer: Medicare Other | Source: Ambulatory Visit | Attending: Physician Assistant | Admitting: Physician Assistant

## 2021-03-18 DIAGNOSIS — D631 Anemia in chronic kidney disease: Secondary | ICD-10-CM | POA: Diagnosis not present

## 2021-03-18 DIAGNOSIS — N1832 Chronic kidney disease, stage 3b: Secondary | ICD-10-CM | POA: Diagnosis not present

## 2021-03-18 DIAGNOSIS — I129 Hypertensive chronic kidney disease with stage 1 through stage 4 chronic kidney disease, or unspecified chronic kidney disease: Secondary | ICD-10-CM | POA: Insufficient documentation

## 2021-03-18 DIAGNOSIS — D472 Monoclonal gammopathy: Secondary | ICD-10-CM | POA: Diagnosis not present

## 2021-03-18 DIAGNOSIS — Z803 Family history of malignant neoplasm of breast: Secondary | ICD-10-CM | POA: Diagnosis not present

## 2021-03-18 DIAGNOSIS — E1122 Type 2 diabetes mellitus with diabetic chronic kidney disease: Secondary | ICD-10-CM | POA: Insufficient documentation

## 2021-03-18 DIAGNOSIS — D500 Iron deficiency anemia secondary to blood loss (chronic): Secondary | ICD-10-CM

## 2021-03-18 LAB — CBC WITH DIFFERENTIAL/PLATELET
Abs Immature Granulocytes: 0.01 10*3/uL (ref 0.00–0.07)
Basophils Absolute: 0 10*3/uL (ref 0.0–0.1)
Basophils Relative: 1 %
Eosinophils Absolute: 0.3 10*3/uL (ref 0.0–0.5)
Eosinophils Relative: 6 %
HCT: 34.4 % — ABNORMAL LOW (ref 36.0–46.0)
Hemoglobin: 10.9 g/dL — ABNORMAL LOW (ref 12.0–15.0)
Immature Granulocytes: 0 %
Lymphocytes Relative: 41 %
Lymphs Abs: 1.9 10*3/uL (ref 0.7–4.0)
MCH: 27.7 pg (ref 26.0–34.0)
MCHC: 31.7 g/dL (ref 30.0–36.0)
MCV: 87.5 fL (ref 80.0–100.0)
Monocytes Absolute: 0.4 10*3/uL (ref 0.1–1.0)
Monocytes Relative: 10 %
Neutro Abs: 2 10*3/uL (ref 1.7–7.7)
Neutrophils Relative %: 42 %
Platelets: 201 10*3/uL (ref 150–400)
RBC: 3.93 MIL/uL (ref 3.87–5.11)
RDW: 13.2 % (ref 11.5–15.5)
WBC: 4.6 10*3/uL (ref 4.0–10.5)
nRBC: 0 % (ref 0.0–0.2)

## 2021-03-18 LAB — COMPREHENSIVE METABOLIC PANEL
ALT: 21 U/L (ref 0–44)
AST: 25 U/L (ref 15–41)
Albumin: 3.9 g/dL (ref 3.5–5.0)
Alkaline Phosphatase: 63 U/L (ref 38–126)
Anion gap: 7 (ref 5–15)
BUN: 29 mg/dL — ABNORMAL HIGH (ref 8–23)
CO2: 23 mmol/L (ref 22–32)
Calcium: 9.4 mg/dL (ref 8.9–10.3)
Chloride: 107 mmol/L (ref 98–111)
Creatinine, Ser: 1.78 mg/dL — ABNORMAL HIGH (ref 0.44–1.00)
GFR, Estimated: 28 mL/min — ABNORMAL LOW (ref >60)
Glucose, Bld: 103 mg/dL — ABNORMAL HIGH (ref 70–99)
Potassium: 3.8 mmol/L (ref 3.5–5.1)
Sodium: 137 mmol/L (ref 135–145)
Total Bilirubin: 0.4 mg/dL (ref 0.3–1.2)
Total Protein: 9.1 g/dL — ABNORMAL HIGH (ref 6.5–8.1)

## 2021-03-18 LAB — IRON AND TIBC
Iron: 85 ug/dL (ref 28–170)
Saturation Ratios: 29 % (ref 10.4–31.8)
TIBC: 293 ug/dL (ref 250–450)
UIBC: 208 ug/dL

## 2021-03-18 LAB — FERRITIN: Ferritin: 223 ng/mL (ref 11–307)

## 2021-03-18 LAB — LACTATE DEHYDROGENASE: LDH: 137 U/L (ref 98–192)

## 2021-03-19 LAB — PROTEIN ELECTROPHORESIS, SERUM
A/G Ratio: 0.8 (ref 0.7–1.7)
Albumin ELP: 3.9 g/dL (ref 2.9–4.4)
Alpha-1-Globulin: 0.3 g/dL (ref 0.0–0.4)
Alpha-2-Globulin: 0.9 g/dL (ref 0.4–1.0)
Beta Globulin: 1 g/dL (ref 0.7–1.3)
Gamma Globulin: 2.5 g/dL — ABNORMAL HIGH (ref 0.4–1.8)
Globulin, Total: 4.6 g/dL — ABNORMAL HIGH (ref 2.2–3.9)
M-Spike, %: 2.2 g/dL — ABNORMAL HIGH
Total Protein ELP: 8.5 g/dL (ref 6.0–8.5)

## 2021-03-19 LAB — KAPPA/LAMBDA LIGHT CHAINS
Kappa free light chain: 223.6 mg/L — ABNORMAL HIGH (ref 3.3–19.4)
Kappa, lambda light chain ratio: 11.96 — ABNORMAL HIGH (ref 0.26–1.65)
Lambda free light chains: 18.7 mg/L (ref 5.7–26.3)

## 2021-03-20 DIAGNOSIS — N189 Chronic kidney disease, unspecified: Secondary | ICD-10-CM | POA: Diagnosis not present

## 2021-03-20 DIAGNOSIS — I129 Hypertensive chronic kidney disease with stage 1 through stage 4 chronic kidney disease, or unspecified chronic kidney disease: Secondary | ICD-10-CM | POA: Diagnosis not present

## 2021-03-20 DIAGNOSIS — R809 Proteinuria, unspecified: Secondary | ICD-10-CM | POA: Diagnosis not present

## 2021-03-20 DIAGNOSIS — E1122 Type 2 diabetes mellitus with diabetic chronic kidney disease: Secondary | ICD-10-CM | POA: Diagnosis not present

## 2021-03-20 DIAGNOSIS — E1129 Type 2 diabetes mellitus with other diabetic kidney complication: Secondary | ICD-10-CM | POA: Diagnosis not present

## 2021-03-24 NOTE — Progress Notes (Signed)
 [facility] [address] [place], [place] [address]   CLINIC:  Medical Oncology/Hematology  PCP:  Simpson, Margaret E, MD [address] [place] [address] [phone]   REASON FOR VISIT:  Follow-up for MGUS and normocytic anemia  CURRENT THERAPY: As needed Feraheme (last given 02/15/2020)  INTERVAL HISTORY:  [name] 80 y.o. 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  She was last seen by Rebekah Pennington PA-C on 10/23/2020.  At today's visit, she reports feeling well.  No recent hospitalizations, surgeries, or changes in baseline health status.  She denies any bleeding episodes such as epistaxis, hematemesis, hematochezia, or melena.  She denies any unusual fatigue, but does report that she is a bit more tired than usual, as she has been helping to take care of her granddaughter with stage IV cancer.  She denies any chest pain, dyspnea on exertion, or syncope.  No new neurologic changes or vasomotor symptoms - she denies any neuropathy, vision changes, tinnitus, strokelike symptoms, dizziness, or headache.  She does report some pain in her right upper arm, extending from just below her shoulder to just above her elbow.  She reports that this is a sudden sharp/stabbing pain that occurs most often while she is sitting at the computer with her arm bent.  It usually resolves in less than 1 minute.  She denies any other new bony pain.  She has 75% energy and low appetite. She endorses that she is maintaining a stable weight.    REVIEW OF SYSTEMS:  Review of Systems  Constitutional:  Positive for appetite change. Negative for chills, diaphoresis, fatigue, fever and unexpected weight change.  HENT:   Negative for lump/mass and nosebleeds.   Eyes:  Negative for eye problems.  Respiratory:  Negative for cough, hemoptysis and shortness of breath.   Cardiovascular:  Negative for chest pain, leg swelling and palpitations.   Gastrointestinal:  Negative for abdominal pain, blood in stool, constipation, diarrhea, nausea and vomiting.  Genitourinary:  Negative for hematuria.   Musculoskeletal:  Positive for arthralgias.  Skin: Negative.   Neurological:  Negative for dizziness, headaches and light-headedness.  Hematological:  Does not bruise/bleed easily.     PAST MEDICAL/SURGICAL HISTORY:  Past Medical History:  Diagnosis Date   Anemia    Chronic back pain    Depression    Diabetes mellitus    GERD (gastroesophageal reflux disease)    Hyperlipidemia    Hypertension    Hypothyroidism    Past Surgical History:  Procedure Laterality Date   BONE MARROW ASPIRATION Left 05/16/15   BONE MARROW BIOPSY Left 05/16/15   COLONOSCOPY  03/30/2012   Procedure: COLONOSCOPY;  Surgeon: Najeeb U Rehman, MD;  [facility]: [facility];  Service: Endoscopy;  Laterality: N/A;  730   COLONOSCOPY N/A 06/30/2017   Procedure: COLONOSCOPY;  Surgeon: Rehman, Najeeb U, MD;  Location: [facility];  Service: Endoscopy;  Laterality: N/A;  2:00   LAMINECTOMY  1980's   POLYPECTOMY  06/30/2017   Procedure: POLYPECTOMY;  Surgeon: Rehman, Najeeb U, MD;  Location: [facility];  Service: Endoscopy;;  colon   SPINE SURGERY  approx 1983   dr botero   TOTAL ABDOMINAL HYSTERECTOMY  approx 1993   fibroids      SOCIAL HISTORY:  Social History   Socioeconomic History   Marital status: Divorced    Spouse name: Not on file   Number of children: 3   Years of   education: Not on file   Highest education level: Not on file  Occupational History   Occupation: LPN  Tobacco Use   Smoking status: Never   Smokeless tobacco: Never  Vaping Use   Vaping Use: Never used  Substance and Sexual Activity   Alcohol use: No   Drug use: No   Sexual activity: Not Currently  Other Topics Concern   Not on file  Social History Narrative   Pt has 2 living adult children, 1 desease at age 46 secondary congential heart disease    Social Determinants of  Health   Financial Resource Strain: Low Risk    Difficulty of Paying Living Expenses: Not hard at all  Food Insecurity: No Food Insecurity   Worried About Charity fundraiser in the Last Year: Never true   Ran Out of Food in the Last Year: Never true  Transportation Needs: No Transportation Needs   Lack of Transportation (Medical): No   Lack of Transportation (Non-Medical): No  Physical Activity: Insufficiently Active   Days of Exercise per Week: 3 days   Minutes of Exercise per Session: 30 min  Stress: No Stress Concern Present   Feeling of Stress : Only a little  Social Connections: Moderately Integrated   Frequency of Communication with Friends and Family: More than three times a week   Frequency of Social Gatherings with Friends and Family: Once a week   Attends Religious Services: More than 4 times per year   Active Member of [employer] or Organizations: Yes   Attends Music therapist: More than 4 times per year   Marital Status: Divorced  Human resources officer Violence: Not At Risk   Fear of Current or Ex-Partner: No   Emotionally Abused: No   Physically Abused: No   Sexually Abused: No    FAMILY HISTORY:  Family History  Problem Relation Age of Onset   Heart attack Mother 73   Heart disease Mother    Cancer Mother        type unknown   Diabetes Mother    Kidney failure Father    Kidney disease Father    Myasthenia gravis Brother    Diabetes Sister    Breast cancer Sister 38   Heart disease Brother    Diabetes Sister    Sickle cell anemia Sister    Cancer Sister        unknown type    CURRENT MEDICATIONS:  Outpatient Encounter Medications as of 03/25/2021  Medication Sig   acetaminophen (TYLENOL) 650 MG CR tablet Take 650 mg by mouth every 8 (eight) hours as needed for pain.    amLODipine (NORVASC) 10 MG tablet TAKE 1 TABLET BY MOUTH  DAILY   aspirin 81 MG EC tablet Take 1 tablet (81 mg total) by mouth daily.   benazepril (LOTENSIN) 5 MG tablet Take 5 mg  by mouth daily.   buPROPion (WELLBUTRIN XL) 150 MG 24 hr tablet TAKE 1 TABLET BY MOUTH  DAILY   calcitRIOL (ROCALTROL) 0.25 MCG capsule Take by mouth.   Calcium-Magnesium-Vitamin D (CALCIUM 1200+D3 PO) Take 1 tablet by mouth 2 (two) times daily.   diphenhydrAMINE (BENADRYL) 25 mg capsule Take 25 mg by mouth at bedtime as needed (for allergies/sleep.).    gabapentin (NEURONTIN) 100 MG capsule TAKE 1 TO 2 CAPSULES BY  MOUTH 3 TIMES DAILY   hydrochlorothiazide (HYDRODIURIL) 25 MG tablet TAKE 1 TABLET BY MOUTH  DAILY   levothyroxine (SYNTHROID) 88 MCG tablet TAKE 1 TABLET BY  MOUTH ONCE DAILY 1/2 HOUR BEFORE FIRST MEAL OF THE DAY. DRINK  WATER   Multiple Vitamin (MULTIVITAMIN WITH MINERALS) TABS tablet Take 1 tablet by mouth daily.   omeprazole (PRILOSEC) 20 MG capsule Take 20 mg by mouth daily as needed (for acid reflux.).    pravastatin (PRAVACHOL) 20 MG tablet TAKE 1 TABLET BY MOUTH IN  THE EVENING   No facility-administered encounter medications on file as of 03/25/2021.    ALLERGIES:  No Known Allergies   PHYSICAL EXAM:  ECOG PERFORMANCE STATUS: 0 - Asymptomatic  There were no vitals filed for this visit. There were no vitals filed for this visit. Physical Exam Constitutional:      Appearance: Normal appearance.  HENT:     Head: Normocephalic and atraumatic.     Mouth/Throat:     Mouth: Mucous membranes are moist.  Eyes:     Extraocular Movements: Extraocular movements intact.     Pupils: Pupils are equal, round, and reactive to light.  Cardiovascular:     Rate and Rhythm: Normal rate and regular rhythm.     Pulses: Normal pulses.     Heart sounds: Normal heart sounds.  Pulmonary:     Effort: Pulmonary effort is normal.     Breath sounds: Normal breath sounds.  Abdominal:     General: Bowel sounds are normal.     Palpations: Abdomen is soft.     Tenderness: There is no abdominal tenderness.  Musculoskeletal:        General: No swelling.     Right lower leg: No edema.      Left lower leg: No edema.  Lymphadenopathy:     Cervical: No cervical adenopathy.  Skin:    General: Skin is warm and dry.  Neurological:     General: No focal deficit present.     Mental Status: She is alert and oriented to person, place, and time.  Psychiatric:        Mood and Affect: Mood normal.        Behavior: Behavior normal.     LABORATORY DATA:  I have reviewed the labs as listed.  CBC    Component Value Date/Time   WBC 4.6 03/18/2021 1208   RBC 3.93 03/18/2021 1208   HGB 10.9 (L) 03/18/2021 1208   HCT 34.4 (L) 03/18/2021 1208   PLT 201 03/18/2021 1208   MCV 87.5 03/18/2021 1208   MCH 27.7 03/18/2021 1208   MCHC 31.7 03/18/2021 1208   RDW 13.2 03/18/2021 1208   LYMPHSABS 1.9 03/18/2021 1208   MONOABS 0.4 03/18/2021 1208   EOSABS 0.3 03/18/2021 1208   BASOSABS 0.0 03/18/2021 1208   CMP Latest Ref Rng & Units 03/18/2021 01/02/2021 10/16/2020  Glucose 70 - 99 mg/dL 103(H) 89 96  BUN 8 - 23 mg/dL 29(H) 16 26(H)  Creatinine 0.44 - 1.00 mg/dL 1.78(H) 1.54(H) 1.72(H)  Sodium 135 - 145 mmol/L 137 139 136  Potassium 3.5 - 5.1 mmol/L 3.8 4.3 3.4(L)  Chloride 98 - 111 mmol/L 107 106 105  CO2 22 - 32 mmol/L 23 19(L) 24  Calcium 8.9 - 10.3 mg/dL 9.4 9.4 9.3  Total Protein 6.5 - 8.1 g/dL 9.1(H) 7.7 9.2(H)  Total Bilirubin 0.3 - 1.2 mg/dL 0.4 0.4 0.5  Alkaline Phos 38 - 126 U/L 63 77 66  AST 15 - 41 U/L _0 ALT 0 - 44 U/L _1 DIAGNOSTIC IMAGING:  I have independently reviewed the relevant imaging and discussed  with the patient.  ASSESSMENT & PLAN: 1.  IgG kappa MGUS: - Bone marrow biopsy on 05/16/2015 shows limited bone marrow sample with no increase in plasma cells in the very limited material present.  Cytogenetics-21, XX.  FISH panel was normal. - Bone survey on 02/22/2017 showed ill-defined calcified lesion in the distal right femur.  MRI of the right femur on 03/02/2017 showed no osseous lesion. -PET scan on 02/25/2020 was negative for any myeloma lesions  or other malignancies. - Most recent skeletal survey (03/18/2021): No lytic or suspicious osseous lesion throughout the axial or appendicular skeleton - Immunofixation shows monoclonal protein with IgG kappa specificity - No B symptoms or new bony pain - Most recent labs (03/18/2021): SPEP shows stable M spike 2.2; elevated kappa light chains 233.6, normal lambda 18.7, elevated ratio 11.96. 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 - Patient complained of right arm/shoulder pain, but review of recent bone survey (03/18/2021) was negative for any lytic or osseous lesion, but did show degenerative changes of bilateral shoulders.  No concern for myeloma-related bone pain at this time.  Patient advised to follow-up with PCP for complaints of shoulder/arm pain. - PLAN:  Repeat MGUS/myeloma panel and RTC in 6 months  2.  Normocytic anemia: -This is from chronic kidney disease and relative iron deficiency.  She last received IV Feraheme on 02/15/2020 - Denies any bleeding per rectum or melena - Most recent labs (03/18/2021): Hgb 10.9 with MCV 87.5, normal LDH, ferritin 223 with iron saturation 29% - Hemoglobin is at goal considering underlying CKD stage IIIb/IV - If patient has worsening anemia with Hgb < 10.0, will consider starting her on ESA - PLAN: No treatment needed at this time.  Repeat anemia panel in 6 months.  3.  CKD stage IIIb/IV: - Creatinine has been stable between 1.5 and 1.8 - Renal ultrasound on 01/24/2020 shows small right renal cyst with no additional findings. - Per note by Dr. Theador Hawthorne, CKD is secondary to diabetes mellitus   PLAN SUMMARY & DISPOSITION: - MGUS/myeloma panel & anemia panel in 6 months - RTC for office visit after labs  All questions were answered. The patient knows to call the clinic with any problems,  questions or concerns.  Medical decision making: Moderate  Time spent on visit: I spent 15 minutes counseling the patient face to face. The total time spent in the appointment was 25 minutes and more than 50% was on counseling.   Harriett Rush, PA-C  03/25/2021 12:03 PM

## 2021-03-25 ENCOUNTER — Ambulatory Visit (INDEPENDENT_AMBULATORY_CARE_PROVIDER_SITE_OTHER): Payer: Medicare Other

## 2021-03-25 ENCOUNTER — Encounter (HOSPITAL_COMMUNITY): Payer: Self-pay | Admitting: Physician Assistant

## 2021-03-25 ENCOUNTER — Inpatient Hospital Stay (HOSPITAL_COMMUNITY): Payer: Medicare Other | Admitting: Physician Assistant

## 2021-03-25 ENCOUNTER — Ambulatory Visit (HOSPITAL_COMMUNITY): Payer: Medicare Other | Admitting: Physician Assistant

## 2021-03-25 ENCOUNTER — Other Ambulatory Visit: Payer: Self-pay

## 2021-03-25 VITALS — BP 156/74 | HR 91 | Temp 97.8°F | Resp 18 | Wt 195.1 lb

## 2021-03-25 DIAGNOSIS — Z23 Encounter for immunization: Secondary | ICD-10-CM | POA: Diagnosis not present

## 2021-03-25 DIAGNOSIS — E1122 Type 2 diabetes mellitus with diabetic chronic kidney disease: Secondary | ICD-10-CM | POA: Diagnosis not present

## 2021-03-25 DIAGNOSIS — N1832 Chronic kidney disease, stage 3b: Secondary | ICD-10-CM | POA: Diagnosis not present

## 2021-03-25 DIAGNOSIS — D631 Anemia in chronic kidney disease: Secondary | ICD-10-CM | POA: Diagnosis not present

## 2021-03-25 DIAGNOSIS — D472 Monoclonal gammopathy: Secondary | ICD-10-CM

## 2021-03-25 DIAGNOSIS — Z803 Family history of malignant neoplasm of breast: Secondary | ICD-10-CM | POA: Diagnosis not present

## 2021-03-25 DIAGNOSIS — I129 Hypertensive chronic kidney disease with stage 1 through stage 4 chronic kidney disease, or unspecified chronic kidney disease: Secondary | ICD-10-CM | POA: Diagnosis not present

## 2021-03-25 NOTE — Patient Instructions (Signed)
[place] at [facility] Discharge Instructions  You were seen today by Tarri Abernethy PA-C for your abnormal protein (MGUS) and your anemia related to chronic kidney disease.  MGUS: As we discussed, this is an abnormal protein in your blood.  In some cases, it can progress to a type of cancer known as multiple myeloma.  You do not show any signs of progression at this time, but we will continue to monitor you with repeat labs and an office visit in 6 months.  ANEMIA: Your blood count is slightly lower than normal, which is related to your underlying chronic kidney disease.  At this time, you do not need any treatment for your anemia, but we will continue to monitor it.  If your blood count goes too low, we will consider starting you on injections called Retacrit, which helps your body to make more blood cells.  We do not believe that your right arm pain is related to the diagnosis that we are following you for.  Your recent skeletal survey did not show any signs of abnormal bone spots on your right shoulder or arm, but did show degenerative changes (arthritis) of your right shoulder.  If you continue to have pain that is becoming bothersome, please follow-up with your primary care provider.  LABS: Return in 6 months for repeat labs  OTHER TESTS: No other tests at this time  MEDICATIONS: No changes to home medications  FOLLOW-UP APPOINTMENT: Office visit in 6 months, after labs   Thank you for choosing [place] at [facility] to provide your oncology and hematology care.  To afford each patient quality time with our provider, please arrive at least 15 minutes before your scheduled appointment time.   If you have a lab appointment with the [facility] please come in thru the Main Entrance and check in at the main information desk.  You need to re-schedule your appointment should you arrive 10 or more minutes late.  We strive to give you  quality time with our providers, and arriving late affects you and other patients whose appointments are after yours.  Also, if you no show three or more times for appointments you may be dismissed from the clinic at the providers discretion.     Again, thank you for choosing [facility].  Our hope is that these requests will decrease the amount of time that you wait before being seen by our physicians.       _____________________________________________________________  Should you have questions after your visit to [facility], please contact our office at [phone] and follow the prompts.  Our office hours are 8:00 a.m. and 4:30 p.m. Monday - Friday.  Please note that voicemails left after 4:00 p.m. may not be returned until the following business day.  We are closed weekends and major holidays.  You do have access to a nurse 24-7, just call the main number to the clinic [phone] and do not press any options, hold on the line and a nurse will answer the phone.    For prescription refill requests, have your pharmacy contact our office and allow 72 hours.    Due to Covid, you will need to wear a mask upon entering the hospital. If you do not have a mask, a mask will be given to you at the Main Entrance upon arrival. For doctor visits, patients may have 1 support person age 24 or older with them.  For treatment visits, patients can not have anyone with them due to social distancing guidelines and our immunocompromised population.

## 2021-03-27 DIAGNOSIS — E211 Secondary hyperparathyroidism, not elsewhere classified: Secondary | ICD-10-CM | POA: Diagnosis not present

## 2021-03-27 DIAGNOSIS — N189 Chronic kidney disease, unspecified: Secondary | ICD-10-CM | POA: Diagnosis not present

## 2021-03-27 DIAGNOSIS — I129 Hypertensive chronic kidney disease with stage 1 through stage 4 chronic kidney disease, or unspecified chronic kidney disease: Secondary | ICD-10-CM | POA: Diagnosis not present

## 2021-03-27 DIAGNOSIS — R809 Proteinuria, unspecified: Secondary | ICD-10-CM | POA: Diagnosis not present

## 2021-03-27 DIAGNOSIS — E1122 Type 2 diabetes mellitus with diabetic chronic kidney disease: Secondary | ICD-10-CM | POA: Diagnosis not present

## 2021-03-27 DIAGNOSIS — E1129 Type 2 diabetes mellitus with other diabetic kidney complication: Secondary | ICD-10-CM | POA: Diagnosis not present

## 2021-03-27 DIAGNOSIS — D638 Anemia in other chronic diseases classified elsewhere: Secondary | ICD-10-CM | POA: Diagnosis not present

## 2021-04-15 ENCOUNTER — Other Ambulatory Visit: Payer: Self-pay

## 2021-04-15 ENCOUNTER — Telehealth: Payer: Self-pay

## 2021-04-15 ENCOUNTER — Ambulatory Visit (INDEPENDENT_AMBULATORY_CARE_PROVIDER_SITE_OTHER): Payer: Medicare Other

## 2021-04-15 VITALS — BP 145/74 | HR 97 | Ht 66.0 in | Wt 195.0 lb

## 2021-04-15 DIAGNOSIS — Z0000 Encounter for general adult medical examination without abnormal findings: Secondary | ICD-10-CM | POA: Diagnosis not present

## 2021-04-15 NOTE — Telephone Encounter (Signed)
Pt here for AWVS today. Pt with c/o of still having sharp, intermittent pain in there R upper arm. Pt states that she has noticed that when she tries to use that arm, she is now having tremors down into her hand. Thank you!

## 2021-04-15 NOTE — Progress Notes (Signed)
Subjective:   Renee Collins is a 80 y.o. female who presents for Medicare Annual (Subsequent) preventive examination.  Review of Systems     Cardiac Risk Factors include: advanced age (>48mn, >>76women);hypertension;diabetes mellitus;obesity (BMI >30kg/m2);sedentary lifestyle     Objective:    Today's Vitals   04/15/21 0902 04/15/21 0909  BP: (!) 145/74   Pulse: 97   SpO2: 96%   Weight: 195 lb (88.5 kg)   Height: _0  (1.676 m)   PainSc:  10-Worst pain ever   Body mass index is 31.47 kg/m.  Advanced Directives 04/15/2021 03/25/2021 10/23/2020 02/08/2020 02/05/2020 12/25/2019 12/12/2019  Does Patient Have a Medical Advance Directive? Yes Yes No No Yes No Yes  Type of [place] will - - - - -  Does patient want to make changes to medical advance directive? - No - Patient declined - No - Patient declined No - Patient declined No - Patient declined No - Patient declined  Copy of [place]? Yes - validated most recent copy scanned in chart (See row information) - - - - - -  Would patient like information on creating a medical advance directive? - - No - Patient declined No - Patient declined No - Patient declined No - Patient declined -  Pre-existing out of facility DNR order (yellow form or pink MOST form) - - - - - - -    Current Medications (verified) Outpatient Encounter Medications as of 04/15/2021  Medication Sig   acetaminophen (TYLENOL) 650 MG CR tablet Take 650 mg by mouth every 8 (eight) hours as needed for pain.    amLODipine (NORVASC) 10 MG tablet TAKE 1 TABLET BY MOUTH  DAILY   aspirin 81 MG EC tablet Take 1 tablet (81 mg total) by mouth daily.   benazepril (LOTENSIN) 5 MG tablet Take 5 mg by mouth daily.   buPROPion (WELLBUTRIN XL) 150 MG 24 hr tablet TAKE 1 TABLET BY MOUTH  DAILY   calcitRIOL (ROCALTROL) 0.25 MCG capsule Take by mouth.   Calcium-Magnesium-Vitamin D  (CALCIUM 1200+D3 PO) Take 1 tablet by mouth 2 (two) times daily.   diphenhydrAMINE (BENADRYL) 25 mg capsule Take 25 mg by mouth at bedtime as needed (for allergies/sleep.).    gabapentin (NEURONTIN) 100 MG capsule TAKE 1 TO 2 CAPSULES BY  MOUTH 3 TIMES DAILY   glucosamine-chondroitin 500-400 MG tablet Take 1 tablet by mouth 3 (three) times daily. Move Free is brand name.   hydrochlorothiazide (HYDRODIURIL) 25 MG tablet TAKE 1 TABLET BY MOUTH  DAILY   levothyroxine (SYNTHROID) 88 MCG tablet TAKE 1 TABLET BY MOUTH ONCE DAILY 1/2 HOUR BEFORE FIRST MEAL OF THE DAY. DRINK  WATER   MODERNA COVID-19 VACCINE 100 MCG/0.5ML injection    Multiple Vitamin (MULTIVITAMIN WITH MINERALS) TABS tablet Take 1 tablet by mouth daily.   omeprazole (PRILOSEC) 20 MG capsule Take 20 mg by mouth daily as needed (for acid reflux.).    pravastatin (PRAVACHOL) 20 MG tablet TAKE 1 TABLET BY MOUTH IN  THE EVENING   No facility-administered encounter medications on file as of 04/15/2021.    Allergies (verified) Patient has no known allergies.   History: Past Medical History:  Diagnosis Date   Anemia    Chronic back pain    Depression    Diabetes mellitus    GERD (gastroesophageal reflux disease)    Hyperlipidemia    Hypertension    Hypothyroidism  Past Surgical History:  Procedure Laterality Date   BONE MARROW ASPIRATION Left 05/16/15   BONE MARROW BIOPSY Left 05/16/15   COLONOSCOPY  03/30/2012   Procedure: COLONOSCOPY;  Surgeon: Rogene Houston, MD;  [facility];  Service: Endoscopy;  Laterality: N/A;  730   COLONOSCOPY N/A 06/30/2017   Procedure: COLONOSCOPY;  Surgeon: Rogene Houston, MD;  [facility];  Service: Endoscopy;  Laterality: N/A;  2:00   LAMINECTOMY  1980's   POLYPECTOMY  06/30/2017   Procedure: POLYPECTOMY;  Surgeon: Rogene Houston, MD;  [facility];  Service: Endoscopy;;  colon   SPINE SURGERY  approx 1983   dr Joya Salm   TOTAL ABDOMINAL HYSTERECTOMY   approx 1993   fibroids    Family History  Problem Relation Age of Onset   Heart attack Mother 72   Heart disease Mother    Cancer Mother        type unknown   Diabetes Mother    Kidney failure Father    Kidney disease Father    Myasthenia gravis Brother    Diabetes Sister    Breast cancer Sister 24   Heart disease Brother    Diabetes Sister    Sickle cell anemia Sister    Cancer Sister        unknown type   Social History   Socioeconomic History   Marital status: Divorced    Spouse name: Not on file   Number of children: 3   Years of education: Not on file   Highest education level: Not on file  Occupational History   Occupation: LPN  Tobacco Use   Smoking status: Never   Smokeless tobacco: Never  Vaping Use   Vaping Use: Never used  Substance and Sexual Activity   Alcohol use: No   Drug use: No   Sexual activity: Not Currently  Other Topics Concern   Not on file  Social History Narrative   Pt has 2 living adult children, 1 desease at age 54 secondary congential heart disease.   7 grandchildren.   Social Determinants of Health   Financial Resource Strain: Low Risk    Difficulty of Paying Living Expenses: Not hard at all  Food Insecurity: No Food Insecurity   Worried About Charity fundraiser in the Last Year: Never true   Arboriculturist in the Last Year: Never true  Transportation Needs: Public librarian (Medical): Yes   Lack of Transportation (Non-Medical): Yes  Physical Activity: Insufficiently Active   Days of Exercise per Week: 3 days   Minutes of Exercise per Session: 30 min  Stress: No Stress Concern Present   Feeling of Stress : Not at all  Social Connections: Moderately Integrated   Frequency of Communication with Friends and Family: More than three times a week   Frequency of Social Gatherings with Friends and Family: More than three times a week   Attends Religious Services: 1 to 4 times per year   Active  Member of [employer] or Organizations: Yes   Attends Archivist Meetings: 1 to 4 times per year   Marital Status: Divorced    Tobacco Counseling Counseling given: Not Answered   Clinical Intake:  Pre-visit preparation completed: Yes  Pain : 0-10 Pain Score: 10-Worst pain ever (When the pain "hits it is a 10" per pt.) Pain Type: Chronic pain Pain Location: Arm Pain Descriptors / Indicators: Sharp Pain Onset: More  than a month ago Pain Frequency: Intermittent     BMI - recorded: 31.47 Nutritional Status: BMI > 30  Obese Nutritional Risks: None Diabetes: Yes  How often do you need to have someone help you when you read instructions, pamphlets, or other written materials from your doctor or pharmacy?: 1 - Never  Diabetic?Nutrition Risk Assessment:  Has the patient had any N/V/D within the last 2 months?  No  Does the patient have any non-healing wounds?  No  Has the patient had any unintentional weight loss or weight gain?  No   Diabetes:  Is the patient diabetic?  Yes  If diabetic, was a CBG obtained today?  No  Did the patient bring in their glucometer from home?  No  How often do you monitor your CBG's? Monitors A1C.   Financial Strains and Diabetes Management:  Are you having any financial strains with the device, your supplies or your medication? No .  Does the patient want to be seen by Chronic Care Management for management of their diabetes?  No  Would the patient like to be referred to a Nutritionist or for Diabetic Management?  No   Diabetic Exams:  Diabetic Eye Exam: Completed 12/04/20. Pt has been advised about the importance in completing this exam. Diabetic Foot Exam: Completed 01/16/2021. Pt has been advised about the importance in completing this exam.   Interpreter Needed?: No  Information entered by :: MJ Kieryn Burtis, LPN   Activities of Daily Living In your present state of health, do you have any difficulty performing the following activities:  04/15/2021  Hearing? N  Vision? N  Difficulty concentrating or making decisions? N  Walking or climbing stairs? N  Dressing or bathing? N  Doing errands, shopping? N  Preparing Food and eating ? N  Using the Toilet? N  In the past six months, have you accidently leaked urine? N  Do you have problems with loss of bowel control? N  Managing your Medications? N  Managing your Finances? N  Housekeeping or managing your Housekeeping? N  Some recent data might be hidden    Patient Care Team: [name], MD as PCP - General (Family Medicine) Whitney Muse, Kelby Fam, MD (Inactive) as Consulting Physician (Hematology and Oncology) Rutherford Guys, MD as Consulting Physician (Ophthalmology) [name], MD  Indicate any recent Medical Services you may have received from other than [facility] providers in the past year (date may be approximate).     Assessment:   This is a routine wellness examination for [place].  Hearing/Vision screen Hearing Screening - Comments:: No hearing issues.  Vision Screening - Comments:: Glasses. Dr. Ronnald Ramp in [place]. 12/04/2020  Dietary issues and exercise activities discussed: Current Exercise Habits: Home exercise routine, Type of exercise: walking, Time (Minutes): 30, Frequency (Times/Week): 3, Weekly Exercise (Minutes/Week): 90, Intensity: Mild, Exercise limited by: cardiac condition(s)   Goals Addressed             This Visit's Progress    Exercise 3x per week (30 min per time)       Would like to get back to the [facility]     Have 3 meals a day   On track    Recommend eating 3 balanced meals a day.       Depression Screen PHQ 2/9 Scores 04/15/2021 01/16/2021 10/06/2020 07/08/2020 04/01/2020 04/01/2020 03/25/2020  PHQ - 2 Score 0 2 0 0 0 0 0  PHQ- 9 Score - 2 - - - - -  Fall Risk Fall Risk  04/15/2021 01/16/2021 10/06/2020 07/08/2020 04/01/2020  Falls in the past year? 0 0 0 0 0  Number falls in past yr: 0 0 0 0 -  Injury with Fall? 0 0 0 0  -  Risk for fall due to : No Fall Risks No Fall Risks No Fall Risks No Fall Risks -  Risk for fall due to: Comment - - - - -  Follow up Falls prevention discussed Falls evaluation completed Falls evaluation completed Falls evaluation completed -    FALL RISK PREVENTION PERTAINING TO THE HOME:  Any stairs in or around the home? Yes  If so, are there any without handrails? No  Home free of loose throw rugs in walkways, pet beds, electrical cords, etc? Yes  Adequate lighting in your home to reduce risk of falls? Yes   ASSISTIVE DEVICES UTILIZED TO PREVENT FALLS:  Life alert? No  Use of a cane, walker or w/c? No  Grab bars in the bathroom? Yes  Shower chair or bench in shower? No  Elevated toilet seat or a handicapped toilet? No   TIMED UP AND GO:  Was the test performed? Yes .  Length of time to ambulate 10 feet: 10 sec.   Gait steady and fast without use of assistive device  Cognitive Function:     6CIT Screen 04/15/2021 04/01/2020 03/30/2019 03/27/2018 10/25/2016  What Year? 0 points 0 points 0 points 0 points 0 points  What month? 0 points 0 points 0 points 0 points 0 points  What time? 0 points 0 points 0 points 0 points 0 points  Count back from 20 0 points 0 points 0 points 0 points 0 points  Months in reverse 2 points 0 points 0 points 0 points 0 points  Repeat phrase 0 points 0 points 0 points 4 points 0 points  Total Score 2 0 0 4 0    Immunizations Immunization History  Administered Date(s) Administered   Fluad Quad(high Dose 65+) 02/19/2019, 03/25/2020, 03/25/2021   Influenza Split 04/28/2011, 04/12/2012   Influenza,inj,Quad PF,6+ Mos 04/09/2013, 05/02/2014, 03/11/2015, 05/28/2016, 03/22/2017, 03/27/2018   Moderna SARS-COV2 Booster Vaccination 01/03/2021   Moderna Sars-Covid-2 Vaccination 08/04/2019, 09/04/2019, 04/25/2020   Pneumococcal Conjugate-13 09/30/2014   Pneumococcal Polysaccharide-23 04/28/2011   Tdap 04/28/2011   Zoster Recombinat (Shingrix)  02/28/2019, 02/28/2019, 06/13/2019   Zoster, Live 08/26/2011    TDAP status: Up to date  Flu Vaccine status: Up to date  Pneumococcal vaccine status: Up to date  Covid-19 vaccine status: Completed vaccines  Qualifies for Shingles Vaccine? Yes   Zostavax completed Yes   Shingrix Completed?: Yes  Screening Tests Health Maintenance  Topic Date Due   TETANUS/TDAP  04/27/2021   COVID-19 Vaccine (5 - Booster for Moderna series) 05/06/2021   HEMOGLOBIN A1C  07/05/2021   OPHTHALMOLOGY EXAM  12/04/2021   FOOT EXAM  01/16/2022   COLONOSCOPY (Pts 45-48yr Insurance coverage will need to be confirmed)  06/30/2022   INFLUENZA VACCINE  Completed   DEXA SCAN  Completed   Zoster Vaccines- Shingrix  Completed   HPV VACCINES  Aged Out    Health Maintenance  There are no preventive care reminders to display for this patient.  Colorectal cancer screening: Type of screening: Colonoscopy. Completed 06/30/2017. Repeat every 10 years  Mammogram status: Completed 06/19/20. Repeat every year  Bone Density status: Completed 06/14/2018. Results reflect: Bone density results: NORMAL. Repeat every 2 years.  Lung Cancer Screening: (Low Dose CT Chest recommended if Age 82-80  years, 30 pack-year currently smoking OR have quit w/in 15years.) does not qualify.   Additional Screening:  Hepatitis C Screening: does not qualify.  Vision Screening: Recommended annual ophthalmology exams for early detection of glaucoma and other disorders of the eye. Is the patient up to date with their annual eye exam?  Yes  Who is the provider or what is the name of the office in which the patient attends annual eye exams? Dr. Ronnald Ramp at [facility] If pt is not established with a provider, would they like to be referred to a provider to establish care? No .   Dental Screening: Recommended annual dental exams for proper oral hygiene  Community Resource Referral / Chronic Care Management: CRR required this visit?  No    CCM required this visit?  No      Plan:     I have personally reviewed and noted the following in the patient's chart:   Medical and social history Use of alcohol, tobacco or illicit drugs  Current medications and supplements including opioid prescriptions.  Functional ability and status Nutritional status Physical activity Advanced directives List of other physicians Hospitalizations, surgeries, and ER visits in previous 12 months Vitals Screenings to include cognitive, depression, and falls Referrals and appointments  In addition, I have reviewed and discussed with patient certain preventive protocols, quality metrics, and best practice recommendations. A written personalized care plan for preventive services as well as general preventive health recommendations were provided to patient.     Chriss Driver, LPN   [id number]   Nurse Notes: Pt states she is doing well other than still having issues with R arm pain, intermittent. Up to date on all Health Maintenance. Mammogram is scheduled for 04/24/2021. Declines repeat bone density at this time.

## 2021-04-15 NOTE — Patient Instructions (Signed)
Renee Collins , Thank you for taking time to come for your Medicare Wellness Visit. I appreciate your ongoing commitment to your health goals. Please review the following plan we discussed and let me know if I can assist you in the future.   Screening recommendations/referrals: Colonoscopy: Done 06/30/2017 Repeat in 10 years  Mammogram: Scheduled for 06/24/2020 Bone Density: Done 06/14/2018  Recommended yearly ophthalmology/optometry visit for glaucoma screening and checkup Recommended yearly dental visit for hygiene and checkup  Vaccinations: Influenza vaccine: Done 03/25/2021 Repeat annually  Pneumococcal vaccine: Done 04/28/2011 and 09/30/2014 Tdap vaccine: Done 04/28/2011 Repeat in 10 years  Shingles vaccine: Done 08/26/2011, 02/28/2019, 06/13/2019   Covid-19:Done 01/03/2021, 04/25/2020, 09/04/2019, 08/04/2019  Advanced directives: Advance directive discussed with you today. I have provided a copy for you to complete at home and have notarized. Once this is complete please bring a copy in to our office so we can scan it into your chart.   Conditions/risks identified: Aim for 30 minutes of exercise or walking each day, drink 6-8 glasses of water and eat lots of fruits and vegetables.   Next appointment: Follow up in one year for your annual wellness visit 2023.   Preventive Care 46 Years and Older, Female Preventive care refers to lifestyle choices and visits with your health care provider that can promote health and wellness. What does preventive care include? A yearly physical exam. This is also called an annual well check. Dental exams once or twice a year. Routine eye exams. Ask your health care provider how often you should have your eyes checked. Personal lifestyle choices, including: Daily care of your teeth and gums. Regular physical activity. Eating a healthy diet. Avoiding tobacco and drug use. Limiting alcohol use. Practicing safe sex. Taking low-dose aspirin every day. Taking  vitamin and mineral supplements as recommended by your health care provider. What happens during an annual well check? The services and screenings done by your health care provider during your annual well check will depend on your age, overall health, lifestyle risk factors, and family history of disease. Counseling  Your health care provider may ask you questions about your: Alcohol use. Tobacco use. Drug use. Emotional well-being. Home and relationship well-being. Sexual activity. Eating habits. History of falls. Memory and ability to understand (cognition). Work and work Statistician. Reproductive health. Screening  You may have the following tests or measurements: Height, weight, and BMI. Blood pressure. Lipid and cholesterol levels. These may be checked every 5 years, or more frequently if you are over 80 years old. Skin check. Lung cancer screening. You may have this screening every year starting at age 81 if you have a 30-pack-year history of smoking and currently smoke or have quit within the past 15 years. Fecal occult blood test (FOBT) of the stool. You may have this test every year starting at age 4. Flexible sigmoidoscopy or colonoscopy. You may have a sigmoidoscopy every 5 years or a colonoscopy every 10 years starting at age 29. Hepatitis C blood test. Hepatitis B blood test. Sexually transmitted disease (STD) testing. Diabetes screening. This is done by checking your blood sugar (glucose) after you have not eaten for a while (fasting). You may have this done every 1-3 years. Bone density scan. This is done to screen for osteoporosis. You may have this done starting at age 72. Mammogram. This may be done every 1-2 years. Talk to your health care provider about how often you should have regular mammograms. Talk with your health care provider about your  test results, treatment options, and if necessary, the need for more tests. Vaccines  Your health care provider may  recommend certain vaccines, such as: Influenza vaccine. This is recommended every year. Tetanus, diphtheria, and acellular pertussis (Tdap, Td) vaccine. You may need a Td booster every 10 years. Zoster vaccine. You may need this after age 68. Pneumococcal 13-valent conjugate (PCV13) vaccine. One dose is recommended after age 41. Pneumococcal polysaccharide (PPSV23) vaccine. One dose is recommended after age 65. Talk to your health care provider about which screenings and vaccines you need and how often you need them. This information is not intended to replace advice given to you by your health care provider. Make sure you discuss any questions you have with your health care provider. Document Released: 07/11/2015 Document Revised: 03/03/2016 Document Reviewed: 04/15/2015 Elsevier Interactive Patient Education  2017 [place] Prevention in the Home Falls can cause injuries. They can happen to people of all ages. There are many things you can do to make your home safe and to help prevent falls. What can I do on the outside of my home? Regularly fix the edges of walkways and driveways and fix any cracks. Remove anything that might make you trip as you walk through a door, such as a raised step or threshold. Trim any bushes or trees on the path to your home. Use bright outdoor lighting. Clear any walking paths of anything that might make someone trip, such as rocks or tools. Regularly check to see if handrails are loose or broken. Make sure that both sides of any steps have handrails. Any raised decks and porches should have guardrails on the edges. Have any leaves, snow, or ice cleared regularly. Use sand or salt on walking paths during winter. Clean up any spills in your garage right away. This includes oil or grease spills. What can I do in the bathroom? Use night lights. Install grab bars by the toilet and in the tub and shower. Do not use towel bars as grab bars. Use non-skid  mats or decals in the tub or shower. If you need to sit down in the shower, use a plastic, non-slip stool. Keep the floor dry. Clean up any water that spills on the floor as soon as it happens. Remove soap buildup in the tub or shower regularly. Attach bath mats securely with double-sided non-slip rug tape. Do not have throw rugs and other things on the floor that can make you trip. What can I do in the bedroom? Use night lights. Make sure that you have a light by your bed that is easy to reach. Do not use any sheets or blankets that are too big for your bed. They should not hang down onto the floor. Have a firm chair that has side arms. You can use this for support while you get dressed. Do not have throw rugs and other things on the floor that can make you trip. What can I do in the kitchen? Clean up any spills right away. Avoid walking on wet floors. Keep items that you use a lot in easy-to-reach places. If you need to reach something above you, use a strong step stool that has a grab bar. Keep electrical cords out of the way. Do not use floor polish or wax that makes floors slippery. If you must use wax, use non-skid floor wax. Do not have throw rugs and other things on the floor that can make you trip. What can I do with my  stairs? Do not leave any items on the stairs. Make sure that there are handrails on both sides of the stairs and use them. Fix handrails that are broken or loose. Make sure that handrails are as long as the stairways. Check any carpeting to make sure that it is firmly attached to the stairs. Fix any carpet that is loose or worn. Avoid having throw rugs at the top or bottom of the stairs. If you do have throw rugs, attach them to the floor with carpet tape. Make sure that you have a light switch at the top of the stairs and the bottom of the stairs. If you do not have them, ask someone to add them for you. What else can I do to help prevent falls? Wear shoes  that: Do not have high heels. Have rubber bottoms. Are comfortable and fit you well. Are closed at the toe. Do not wear sandals. If you use a stepladder: Make sure that it is fully opened. Do not climb a closed stepladder. Make sure that both sides of the stepladder are locked into place. Ask someone to hold it for you, if possible. Clearly mark and make sure that you can see: Any grab bars or handrails. First and last steps. Where the edge of each step is. Use tools that help you move around (mobility aids) if they are needed. These include: Canes. Walkers. Scooters. Crutches. Turn on the lights when you go into a dark area. Replace any light bulbs as soon as they burn out. Set up your furniture so you have a clear path. Avoid moving your furniture around. If any of your floors are uneven, fix them. If there are any pets around you, be aware of where they are. Review your medicines with your doctor. Some medicines can make you feel dizzy. This can increase your chance of falling. Ask your doctor what other things that you can do to help prevent falls. This information is not intended to replace advice given to you by your health care provider. Make sure you discuss any questions you have with your health care provider. Document Released: 04/10/2009 Document Revised: 11/20/2015 Document Reviewed: 07/19/2014 Elsevier Interactive Patient Education  2017 [employer].

## 2021-04-17 ENCOUNTER — Telehealth: Payer: Self-pay

## 2021-04-17 NOTE — Telephone Encounter (Signed)
Called patient about a bill she has for $325.00.  I do not see anything that is self pay for Ms. Nori Riis.  She has 2 balances for [facility] but they are bother with Insurance pending payment.  Ms Mccartney said ok and she will let us know if she has any other questions.

## 2021-04-22 ENCOUNTER — Ambulatory Visit (INDEPENDENT_AMBULATORY_CARE_PROVIDER_SITE_OTHER): Payer: Medicare Other | Admitting: Family Medicine

## 2021-04-22 ENCOUNTER — Encounter: Payer: Self-pay | Admitting: Family Medicine

## 2021-04-22 ENCOUNTER — Other Ambulatory Visit: Payer: Self-pay

## 2021-04-22 ENCOUNTER — Ambulatory Visit (HOSPITAL_COMMUNITY)
Admission: RE | Admit: 2021-04-22 | Discharge: 2021-04-22 | Disposition: A | Discharge status: Home / Self Care | Payer: Medicare Other | Source: Ambulatory Visit | Attending: Family Medicine | Admitting: Family Medicine

## 2021-04-22 VITALS — BP 130/70 | HR 98 | Resp 18 | Ht 66.0 in | Wt 196.0 lb

## 2021-04-22 DIAGNOSIS — M47812 Spondylosis without myelopathy or radiculopathy, cervical region: Secondary | ICD-10-CM | POA: Diagnosis not present

## 2021-04-22 DIAGNOSIS — M792 Neuralgia and neuritis, unspecified: Secondary | ICD-10-CM | POA: Diagnosis not present

## 2021-04-22 DIAGNOSIS — M79601 Pain in right arm: Secondary | ICD-10-CM | POA: Insufficient documentation

## 2021-04-22 DIAGNOSIS — I10 Essential (primary) hypertension: Secondary | ICD-10-CM

## 2021-04-22 MED ORDER — METHYLPREDNISOLONE ACETATE 80 MG/ML IJ SUSP
80.0000 mg | Freq: Once | INTRAMUSCULAR | Status: AC
Start: 2021-04-22 — End: 2021-04-22
  Administered 2021-04-22: 80 mg via INTRAMUSCULAR

## 2021-04-22 MED ORDER — PREDNISONE 10 MG PO TABS: 10.0000 mg | ORAL_TABLET | Freq: Two times a day (BID) | ORAL | 0 refills | Status: DC

## 2021-04-22 NOTE — Assessment & Plan Note (Signed)
Localized soft tissue swelling in area of pain, obtain US

## 2021-04-22 NOTE — Assessment & Plan Note (Signed)
Controlled, no change in medication DASH diet and commitment to daily physical activity for a minimum of 30 minutes discussed and encouraged, as a part of hypertension management. The importance of attaining a healthy weight is also discussed.  BP/Weight 04/22/2021 04/15/2021 03/25/2021 01/16/2021 10/23/2020 10/06/2020 03/06/300  Systolic BP 499 692 493 241 991 444 584  Diastolic BP 70 74 74 79 80 80 83  Wt. (Lbs) 196.04 195 195.1 194 195.55 195 -  BMI 31.64 31.47 31.49 31.31 31.56 31.47 -

## 2021-04-22 NOTE — Assessment & Plan Note (Addendum)
3 month history, increasingly disabling, uppdate  C spine x ray, short course of oral prednisone, and depo medrol 80 mg im in office

## 2021-04-22 NOTE — Progress Notes (Signed)
   Renee Collins     MRN: [id number]      DOB: 1941-06-16   HPI Renee Collins is here with left arm pain intermittently since 01/16/2021. It at times awakens her, when it hits brings tears to her eyes, increased in frequency at least 4 days / week, several episodes from twice to 4 times/ day, some relief from advil, not tylenol , no inciting trauma Had recent bone scan which is normal  ROS Denies recent fever or chills. Denies sinus pressure, nasal congestion, ear pain or sore throat. Denies chest congestion, productive cough or wheezing. Denies chest pains, palpitations and leg swelling Denies abdominal pain, nausea, vomiting,diarrhea or constipation.   Denies dysuria, frequency, hesitancy or incontinence. Denies headaches, seizures, numbness, or tingling.  Denies skin break down or rash.   PE  BP 130/70   Pulse 98   Resp 18   Ht 5\' 6"  (1.676 m)   Wt 196 lb 0.6 oz (88.9 kg)   BMI 31.64 kg/m    Patient alert and oriented and in no cardiopulmonary distress.Patient in pain  HEENT: No facial asymmetry, EOMI,     Neck supple .  Chest: Clear to auscultation bilaterally.  CVS: S1, S2 no murmurs, no S3.Regular rate.  ABD: Soft non tender.   Ext: No edema, tender mass on right upper arm, approx 3 cm diameter   MS: Adequate ROM spine, shoulders, hips and knees.  Skin: Intact, no ulcerations or rash noted.  Psych: Good eye contact, normal affect. Memory intact not anxious or depressed appearing.  CNS: CN 2-12 intact, power,  normal throughout.no focal deficits noted.   Assessment & Plan  Arm pain, lateral, right Localized soft tissue swelling in area of pain, obtain US  Cervical spine arthritis with nerve pain 3 month history, increasingly disabling, uppdate  C spine x ray, short course of oral prednisone, and depo medrol 80 mg im in office  Essential hypertension Controlled, no change in medication DASH diet and commitment to daily physical activity for a minimum of 30  minutes discussed and encouraged, as a part of hypertension management. The importance of attaining a healthy weight is also discussed.  BP/Weight 04/22/2021 04/15/2021 03/25/2021 01/16/2021 10/23/2020 10/06/2020 2/33/4356  Systolic BP 861 683 729 021 115 520 802  Diastolic BP 70 74 74 79 80 80 83  Wt. (Lbs) 196.04 195 195.1 194 195.55 195 -  BMI 31.64 31.47 31.49 31.31 31.56 31.47 -

## 2021-04-22 NOTE — Patient Instructions (Signed)
F/U as before, call if  pain persists  X ray of C spine today, I believe the pain in your arm is coming from arthritis in your neck  Depo medrol 80 mg IM in the office today, followed by a  short course of prednisone  You are referred for an US of the right arm where you feel pain, we will call with appointment info  Thanks for choosing [facility], we consider it a privelige to serve you.

## 2021-04-23 ENCOUNTER — Ambulatory Visit (HOSPITAL_COMMUNITY)
Admission: RE | Admit: 2021-04-23 | Discharge: 2021-04-23 | Disposition: A | Discharge status: Home / Self Care | Payer: Medicare Other | Source: Ambulatory Visit | Attending: Family Medicine | Admitting: Family Medicine

## 2021-04-23 DIAGNOSIS — M79601 Pain in right arm: Secondary | ICD-10-CM | POA: Diagnosis not present

## 2021-04-23 DIAGNOSIS — Z0389 Encounter for observation for other suspected diseases and conditions ruled out: Secondary | ICD-10-CM | POA: Diagnosis not present

## 2021-05-04 ENCOUNTER — Encounter: Payer: Self-pay | Admitting: Family Medicine

## 2021-05-05 ENCOUNTER — Other Ambulatory Visit: Payer: Self-pay | Admitting: Family Medicine

## 2021-05-05 DIAGNOSIS — M542 Cervicalgia: Secondary | ICD-10-CM

## 2021-05-06 NOTE — Telephone Encounter (Signed)
MRI scheduled 05/08/2021 @11am  arrival time 10:30am  [facility].  Pt knows and expresses understanding.

## 2021-05-08 ENCOUNTER — Ambulatory Visit (HOSPITAL_COMMUNITY)
Admission: RE | Admit: 2021-05-08 | Discharge: 2021-05-08 | Disposition: A | Discharge status: Home / Self Care | Payer: Medicare Other | Source: Ambulatory Visit | Attending: Family Medicine | Admitting: Family Medicine

## 2021-05-08 ENCOUNTER — Other Ambulatory Visit: Payer: Self-pay

## 2021-05-08 DIAGNOSIS — M542 Cervicalgia: Secondary | ICD-10-CM | POA: Insufficient documentation

## 2021-05-13 ENCOUNTER — Other Ambulatory Visit: Payer: Self-pay

## 2021-05-13 ENCOUNTER — Telehealth: Payer: Self-pay | Admitting: Family Medicine

## 2021-05-13 DIAGNOSIS — M792 Neuralgia and neuritis, unspecified: Secondary | ICD-10-CM

## 2021-05-13 DIAGNOSIS — M47812 Spondylosis without myelopathy or radiculopathy, cervical region: Secondary | ICD-10-CM

## 2021-05-13 NOTE — Telephone Encounter (Signed)
Pt called in to return call for blood work

## 2021-05-13 NOTE — Telephone Encounter (Signed)
Pt aware of results 

## 2021-05-25 DIAGNOSIS — N189 Chronic kidney disease, unspecified: Secondary | ICD-10-CM | POA: Diagnosis not present

## 2021-05-25 DIAGNOSIS — E1122 Type 2 diabetes mellitus with diabetic chronic kidney disease: Secondary | ICD-10-CM | POA: Diagnosis not present

## 2021-05-25 DIAGNOSIS — E1129 Type 2 diabetes mellitus with other diabetic kidney complication: Secondary | ICD-10-CM | POA: Diagnosis not present

## 2021-05-25 DIAGNOSIS — I10 Essential (primary) hypertension: Secondary | ICD-10-CM | POA: Diagnosis not present

## 2021-05-25 DIAGNOSIS — M4712 Other spondylosis with myelopathy, cervical region: Secondary | ICD-10-CM | POA: Diagnosis not present

## 2021-05-25 DIAGNOSIS — R809 Proteinuria, unspecified: Secondary | ICD-10-CM | POA: Diagnosis not present

## 2021-05-25 DIAGNOSIS — I129 Hypertensive chronic kidney disease with stage 1 through stage 4 chronic kidney disease, or unspecified chronic kidney disease: Secondary | ICD-10-CM | POA: Diagnosis not present

## 2021-05-29 DIAGNOSIS — E1129 Type 2 diabetes mellitus with other diabetic kidney complication: Secondary | ICD-10-CM | POA: Diagnosis not present

## 2021-05-29 DIAGNOSIS — I129 Hypertensive chronic kidney disease with stage 1 through stage 4 chronic kidney disease, or unspecified chronic kidney disease: Secondary | ICD-10-CM | POA: Diagnosis not present

## 2021-05-29 DIAGNOSIS — E211 Secondary hyperparathyroidism, not elsewhere classified: Secondary | ICD-10-CM | POA: Diagnosis not present

## 2021-05-29 DIAGNOSIS — D638 Anemia in other chronic diseases classified elsewhere: Secondary | ICD-10-CM | POA: Diagnosis not present

## 2021-05-29 DIAGNOSIS — E1122 Type 2 diabetes mellitus with diabetic chronic kidney disease: Secondary | ICD-10-CM | POA: Diagnosis not present

## 2021-05-29 DIAGNOSIS — N189 Chronic kidney disease, unspecified: Secondary | ICD-10-CM | POA: Diagnosis not present

## 2021-05-29 DIAGNOSIS — R809 Proteinuria, unspecified: Secondary | ICD-10-CM | POA: Diagnosis not present

## 2021-06-03 ENCOUNTER — Other Ambulatory Visit: Payer: Self-pay | Admitting: Family Medicine

## 2021-06-12 ENCOUNTER — Ambulatory Visit: Payer: Medicare Other | Admitting: Family Medicine

## 2021-06-15 ENCOUNTER — Ambulatory Visit (HOSPITAL_COMMUNITY): Payer: Medicare Other | Attending: Neurosurgery | Admitting: Physical Therapy

## 2021-06-15 ENCOUNTER — Other Ambulatory Visit: Payer: Self-pay

## 2021-06-15 ENCOUNTER — Encounter (HOSPITAL_COMMUNITY): Payer: Self-pay | Admitting: Physical Therapy

## 2021-06-15 DIAGNOSIS — M542 Cervicalgia: Secondary | ICD-10-CM | POA: Diagnosis not present

## 2021-06-15 NOTE — Therapy (Signed)
[place], [place], [address] Phone: [phone]   Fax:  [phone]  Physical Therapy Evaluation  Patient Details  Name: Renee Collins MRN: [id number] Date of Birth: 07/06/1940 Referring Provider (PT): Duffy Rhody MD   Encounter Date: 06/15/2021   PT End of Session - 06/15/21 1109     Visit Number 1    Number of Visits 1    Date for PT Re-Evaluation 06/15/21    Authorization Type [employer] medicare no auth, no VL    PT Start Time 0601    PT Stop Time 1103    PT Time Calculation (min) 20 min    Activity Tolerance Patient tolerated treatment well    Behavior During Therapy [facility] for tasks assessed/performed             Past Medical History:  Diagnosis Date   Anemia    Chronic back pain    Depression    Diabetes mellitus    GERD (gastroesophageal reflux disease)    Hyperlipidemia    Hypertension    Hypothyroidism     Past Surgical History:  Procedure Laterality Date   BONE MARROW ASPIRATION Left 05/16/15   BONE MARROW BIOPSY Left 05/16/15   COLONOSCOPY  03/30/2012   Procedure: COLONOSCOPY;  Surgeon: Rogene Houston, MD;  [facility];  Service: Endoscopy;  Laterality: N/A;  730   COLONOSCOPY N/A 06/30/2017   Procedure: COLONOSCOPY;  Surgeon: Rogene Houston, MD;  Location: [facility];  Service: Endoscopy;  Laterality: N/A;  2:00   LAMINECTOMY  1980's   POLYPECTOMY  06/30/2017   Procedure: POLYPECTOMY;  Surgeon: Rogene Houston, MD;  Location: [facility];  Service: Endoscopy;;  colon   SPINE SURGERY  approx 1983   dr Joya Salm   TOTAL ABDOMINAL HYSTERECTOMY  approx 1993   fibroids     There were no vitals filed for this visit.    Subjective Assessment - 06/15/21 1042     Subjective Patient is a 80 y.o. female who presents to physical therapy with referral for cervical spondylosis with myelopathy. Patient states symptoms into R UE. She went to MD who told her to increase medication,  Gabapentin. She is not having that pain much now but every now and then she has some pain. Symptoms increase with using computer. States she was told it is a pinched nerve. She is not having much difficulty with things currently. Symptoms were sharp pain and keeping her up at night.    Limitations Other (comment)   using computer   Patient Stated Goals get checked over    Currently in Pain? No/denies                [facility] PT Assessment - 06/15/21 0001       Assessment   Medical Diagnosis Cervical spondylosis w/ myelopathy    Referring Provider (PT) Duffy Rhody MD    Onset Date/Surgical Date 12/26/20    Next MD Visit Jan/Feb      Precautions   Precautions None      Restrictions   Weight Bearing Restrictions No      Prior Function   Level of Independence Independent    Vocation Retired      Associate Professor   Overall Cognitive Status Within Functional Limits for tasks assessed      Observation/Other Assessments   Observations Ambulates without AD    Focus on Therapeutic Outcomes (FOTO)  n/a - possibly one  time visit      Sensation   Light Touch Appears Intact      ROM / Strength   AROM / PROM / Strength AROM;Strength      AROM   AROM Assessment Site Cervical    Cervical Flexion 0% limited, no symptoms    Cervical Extension 0% limited, no symptoms    Cervical - Right Side Bend 25% limited, no symptoms    Cervical - Left Side Bend 25% limited, no symptoms    Cervical - Right Rotation 0% limited, no symptoms    Cervical - Left Rotation 0% limited, no symptoms      Strength   Strength Assessment Site Shoulder;Elbow;Wrist;Hand    Right/Left Shoulder Right;Left    Right Shoulder Flexion 5/5    Right Shoulder ABduction 5/5    Left Shoulder Flexion 5/5    Left Shoulder ABduction 5/5    Right/Left Elbow Right;Left    Right Elbow Flexion 5/5    Right Elbow Extension 5/5    Left Elbow Flexion 5/5    Left Elbow Extension 5/5    Right/Left Wrist Right;Left    Right  Wrist Flexion 5/5    Right Wrist Extension 5/5    Left Wrist Flexion 5/5    Left Wrist Extension 5/5    Right/Left hand Right;Left    Right Hand Gross Grasp Functional    Left Hand Gross Grasp Functional      Palpation   Spinal mobility WFL c/sp    Palpation comment no tenderness thoughout cervical spine, bilateral  RC, periscap, BUE                        Objective measurements completed on examination: See above findings.                PT Education - 06/15/21 1041     Education Details Patient educated on exam findings, POC, scope of PT, returning to PT if necessary    Person(s) Educated Patient    Methods Explanation;Demonstration    Comprehension Verbalized understanding;Returned demonstration              PT Short Term Goals - 06/15/21 1114       PT SHORT TERM GOAL #1   Title Patient will be educated on exam findings, POC    Time 1    Period Days    Status Achieved    Target Date 06/15/21                       Plan - 06/15/21 1110     Clinical Impression Statement Patient is a 80 y.o. female who presents to physical therapy with referral for cervical spondylosis with myelopathy. Patient with symptoms when using the computer at home but they have decreased significantly with increase in Gabapentin after MD f/u. Patient without symptom reproduction at this time with minimal limitation noted with testing. Patient educated on f/u if symptoms increase/persist. Patient does not require additional PT services at this time.    Personal Factors and Comorbidities Age;Time since onset of injury/illness/exacerbation    Examination-Activity Limitations Sleep;Other   using computer   Examination-Participation Restrictions Shop    Stability/Clinical Decision Making Stable/Uncomplicated    Clinical Decision Making Low    PT Frequency One time visit    PT Next Visit Plan n/a    Consulted and Agree with Plan of Care Patient  Patient will benefit from skilled therapeutic intervention in order to improve the following deficits and impairments:  Decreased activity tolerance, Decreased mobility, Decreased range of motion  Visit Diagnosis: Cervicalgia     Problem List Patient Active Problem List   Diagnosis Date Noted   Arm pain, lateral, right 04/22/2021   Cervical spine arthritis with nerve pain 04/22/2021   Murmur, cardiac 07/08/2020   Depression, major, single episode, in partial remission ([place]) 03/28/2020   Early satiety 11/27/2019   Sciatica of right side 03/28/2018   Obesity, unspecified 11/25/2017   Monoclonal gammopathy 08/13/2013   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

## 2021-06-24 ENCOUNTER — Other Ambulatory Visit: Payer: Self-pay

## 2021-06-24 ENCOUNTER — Ambulatory Visit (HOSPITAL_COMMUNITY)
Admission: RE | Admit: 2021-06-24 | Discharge: 2021-06-24 | Disposition: A | Discharge status: Home / Self Care | Payer: Medicare Other | Source: Ambulatory Visit | Attending: Family Medicine | Admitting: Family Medicine

## 2021-06-24 DIAGNOSIS — Z1231 Encounter for screening mammogram for malignant neoplasm of breast: Secondary | ICD-10-CM | POA: Insufficient documentation

## 2021-07-06 DIAGNOSIS — M4712 Other spondylosis with myelopathy, cervical region: Secondary | ICD-10-CM | POA: Diagnosis not present

## 2021-07-06 DIAGNOSIS — E041 Nontoxic single thyroid nodule: Secondary | ICD-10-CM | POA: Diagnosis not present

## 2021-07-07 LAB — TSH: TSH: 1.78 u[IU]/mL (ref 0.450–4.500)

## 2021-07-09 ENCOUNTER — Ambulatory Visit: Payer: Medicare Other | Admitting: Podiatry

## 2021-07-10 ENCOUNTER — Encounter: Payer: Self-pay | Admitting: Family Medicine

## 2021-07-10 ENCOUNTER — Other Ambulatory Visit (INDEPENDENT_AMBULATORY_CARE_PROVIDER_SITE_OTHER): Payer: Medicare Other

## 2021-07-10 ENCOUNTER — Other Ambulatory Visit: Payer: Self-pay

## 2021-07-10 ENCOUNTER — Ambulatory Visit (INDEPENDENT_AMBULATORY_CARE_PROVIDER_SITE_OTHER): Payer: Medicare Other | Admitting: Family Medicine

## 2021-07-10 VITALS — BP 132/75 | HR 99 | Ht 66.0 in | Wt 197.1 lb

## 2021-07-10 DIAGNOSIS — E1149 Type 2 diabetes mellitus with other diabetic neurological complication: Secondary | ICD-10-CM | POA: Diagnosis not present

## 2021-07-10 DIAGNOSIS — E785 Hyperlipidemia, unspecified: Secondary | ICD-10-CM

## 2021-07-10 DIAGNOSIS — R011 Cardiac murmur, unspecified: Secondary | ICD-10-CM

## 2021-07-10 DIAGNOSIS — Z0000 Encounter for general adult medical examination without abnormal findings: Secondary | ICD-10-CM

## 2021-07-10 LAB — POCT GLYCOSYLATED HEMOGLOBIN (HGB A1C): HbA1c, POC (controlled diabetic range): 6.6 % (ref 0.0–7.0)

## 2021-07-10 NOTE — Progress Notes (Signed)
° ° ° °  [facility]e [facility]     MRN: [id number]      DOB: [birth year]  HPI: Patient is in for annual physical exam. No other health concerns are expressed or addressed at the visit. Recent labs,  are reviewed. Immunization is reviewed , and  updated if needed.   PE: BP 132/75    Pulse 99    Ht 5\' 6"  (1.676 m)    Wt 197 lb 1.9 oz (89.4 kg)    SpO2 98%    BMI 31.82 kg/m   Pleasant  female, alert and oriented x 3, in no cardio-pulmonary distress. Afebrile. HEENT No facial trauma or asymetry. Sinuses non tender.  Extra occullar muscles intact.. External ears normal, . Neck:decreased ROM, no adenopathy,JVD or thyromegaly.No bruits.  Chest: Clear to ascultation bilaterally.No crackles or wheezes. Non tender to palpation   Cardiovascular system; Heart sounds normal,  S1 and  S2 ,no S3.  Systrolic murmur, or thrill. Apical beat not displaced Peripheral pulses normal.  Abdomen: Soft, non tender, no organomegaly or masses. No bruits. Bowel sounds normal. No guarding, tenderness or rebound.   .   Musculoskeletal exam: Decreased  ROM of spine, hips , shoulders and knees. No deformity ,swelling or crepitus noted. No muscle wasting or atrophy.   Neurologic: Cranial nerves 2 to 12 intact. Power, tone ,sensation and reflexes normal throughout. No disturbance in gait. No tremor.  Skin: Intact, no ulceration, erythema , scaling or rash noted. Pigmentation normal throughout  Psych; Normal mood and affect. Judgement and concentration normal   Assessment & Plan:  Murmur, cardiac Refer cardiology needs rept echo  Annual physical exam Annual exam as documented. Counseling done  re healthy lifestyle involving commitment to 150 minutes exercise per week, heart healthy diet, and attaining healthy weight.The importance of adequate sleep also discussed. Regular seat belt use and home safety, is also discussed. Changes in health habits are decided on by the patient with goals and time  frames  set for achieving them. Immunization and cancer screening needs are specifically addressed at this visit.

## 2021-07-10 NOTE — Patient Instructions (Signed)
F/U in end May, call if you need me before  Fasting lipid and hepatic panel with dr Yates Decamp lab work  Check pharmacy for TDAP if covered, you need it  Thyroid and blood sugar are good  It is important that you exercise regularly at least 30 minutes 5 times a week. If you develop chest pain, have severe difficulty breathing, or feel very tired, stop exercising immediately and seek medical attention    Think about what you will eat, plan ahead. Choose " clean, green, fresh or frozen" over canned, processed or packaged foods which are more sugary, salty and fatty. 70 to 75% of food eaten should be vegetables and fruit. Three meals at set times with snacks allowed between meals, but they must be fruit or vegetables. Aim to eat over a 12 hour period , example 7 am to 7 pm, and STOP after  your last meal of the day. Drink water,generally about 64 ounces per day, no other drink is as healthy. Fruit juice is best enjoyed in a healthy way, by EATING the fruit.   Thanks for choosing [facility], we consider it a privelige to serve you.

## 2021-07-11 ENCOUNTER — Encounter: Payer: Self-pay | Admitting: Family Medicine

## 2021-07-11 NOTE — Assessment & Plan Note (Signed)
Refer cardiology needs rept echo

## 2021-07-11 NOTE — Assessment & Plan Note (Signed)

## 2021-07-15 ENCOUNTER — Telehealth: Payer: Self-pay | Admitting: Family Medicine

## 2021-07-15 NOTE — Telephone Encounter (Signed)
Surgery Clearance  Received --[facility]   Sent to Nurse

## 2021-07-15 NOTE — Telephone Encounter (Signed)
Pt return call °

## 2021-07-15 NOTE — Telephone Encounter (Signed)
Spoke with patient she is aware of referral to cardiology to follow up on her heart murmur.

## 2021-07-16 ENCOUNTER — Telehealth: Payer: Self-pay

## 2021-07-16 DIAGNOSIS — I350 Nonrheumatic aortic (valve) stenosis: Secondary | ICD-10-CM

## 2021-07-16 NOTE — Telephone Encounter (Signed)
Needs yearly echo for AS, will forward front office staff to schedule.

## 2021-07-21 ENCOUNTER — Encounter: Payer: Self-pay | Admitting: Family Medicine

## 2021-07-22 NOTE — Telephone Encounter (Signed)
This has been scheduled

## 2021-07-24 ENCOUNTER — Encounter: Payer: Self-pay | Admitting: Family Medicine

## 2021-07-24 ENCOUNTER — Other Ambulatory Visit: Payer: Self-pay

## 2021-07-24 ENCOUNTER — Ambulatory Visit (HOSPITAL_COMMUNITY)
Admission: RE | Admit: 2021-07-24 | Discharge: 2021-07-24 | Disposition: A | Discharge status: Home / Self Care | Payer: Medicare Other | Source: Ambulatory Visit | Attending: Family Medicine | Admitting: Family Medicine

## 2021-07-24 ENCOUNTER — Ambulatory Visit (INDEPENDENT_AMBULATORY_CARE_PROVIDER_SITE_OTHER): Payer: Medicare Other | Admitting: Family Medicine

## 2021-07-24 ENCOUNTER — Other Ambulatory Visit: Payer: Self-pay | Admitting: Family Medicine

## 2021-07-24 VITALS — BP 136/79 | HR 95 | Ht 66.0 in | Wt 196.1 lb

## 2021-07-24 DIAGNOSIS — R9431 Abnormal electrocardiogram [ECG] [EKG]: Secondary | ICD-10-CM

## 2021-07-24 DIAGNOSIS — I10 Essential (primary) hypertension: Secondary | ICD-10-CM

## 2021-07-24 DIAGNOSIS — K449 Diaphragmatic hernia without obstruction or gangrene: Secondary | ICD-10-CM | POA: Diagnosis not present

## 2021-07-24 DIAGNOSIS — Z01818 Encounter for other preprocedural examination: Secondary | ICD-10-CM | POA: Insufficient documentation

## 2021-07-24 DIAGNOSIS — E785 Hyperlipidemia, unspecified: Secondary | ICD-10-CM

## 2021-07-24 DIAGNOSIS — I350 Nonrheumatic aortic (valve) stenosis: Secondary | ICD-10-CM | POA: Diagnosis not present

## 2021-07-24 LAB — POCT URINALYSIS DIP (CLINITEK)
Bilirubin, UA: NEGATIVE
Glucose, UA: NEGATIVE mg/dL
Ketones, POC UA: NEGATIVE mg/dL
Leukocytes, UA: NEGATIVE
Nitrite, UA: NEGATIVE
POC PROTEIN,UA: 30 — AB
Spec Grav, UA: 1.02 (ref 1.010–1.025)
Urobilinogen, UA: 1 E.U./dL
pH, UA: 6.5 (ref 5.0–8.0)

## 2021-07-24 NOTE — Patient Instructions (Addendum)
F/u as before, call if you need me sooner  Labs today Cbc, LIPID, CMP AND Egfr AND cbc  URINE IS SENT FOR CULTURE, WE WILL CALL YOU IF YOU HAVE AN INFECTION  You are referrd to Cardiology for clearance  CXR today please  All the best with spine surgery  Thanks for choosing [facility], we consider it a privelige to serve you.

## 2021-07-25 LAB — CMP14+EGFR
ALT: 23 IU/L (ref 0–32)
AST: 27 IU/L (ref 0–40)
Albumin/Globulin Ratio: 1 — ABNORMAL LOW (ref 1.2–2.2)
Albumin: 4.5 g/dL (ref 3.6–4.6)
Alkaline Phosphatase: 85 IU/L (ref 44–121)
BUN/Creatinine Ratio: 17 (ref 12–28)
BUN: 29 mg/dL — ABNORMAL HIGH (ref 8–27)
Bilirubin Total: 0.4 mg/dL (ref 0.0–1.2)
CO2: 23 mmol/L (ref 20–29)
Calcium: 10.7 mg/dL — ABNORMAL HIGH (ref 8.7–10.3)
Chloride: 101 mmol/L (ref 96–106)
Creatinine, Ser: 1.69 mg/dL — ABNORMAL HIGH (ref 0.57–1.00)
Globulin, Total: 4.3 g/dL (ref 1.5–4.5)
Glucose: 99 mg/dL (ref 70–99)
Potassium: 4 mmol/L (ref 3.5–5.2)
Sodium: 136 mmol/L (ref 134–144)
Total Protein: 8.8 g/dL — ABNORMAL HIGH (ref 6.0–8.5)
eGFR: 30 mL/min/{1.73_m2} — ABNORMAL LOW (ref >59)

## 2021-07-25 LAB — CBC
Hematocrit: 36.1 % (ref 34.0–46.6)
Hemoglobin: 12 g/dL (ref 11.1–15.9)
MCH: 27.7 pg (ref 26.6–33.0)
MCHC: 33.2 g/dL (ref 31.5–35.7)
MCV: 83 fL (ref 79–97)
Platelets: 233 10*3/uL (ref 150–450)
RBC: 4.33 x10E6/uL (ref 3.77–5.28)
RDW: 12.4 % (ref 11.7–15.4)
WBC: 5.3 10*3/uL (ref 3.4–10.8)

## 2021-07-25 LAB — LIPID PANEL
Chol/HDL Ratio: 2.7 ratio (ref 0.0–4.4)
Cholesterol, Total: 179 mg/dL (ref 100–199)
HDL: 67 mg/dL (ref >39)
LDL Chol Calc (NIH): 99 mg/dL (ref 0–99)
Triglycerides: 70 mg/dL (ref 0–149)
VLDL Cholesterol Cal: 13 mg/dL (ref 5–40)

## 2021-07-26 LAB — URINE CULTURE

## 2021-07-27 ENCOUNTER — Encounter: Payer: Self-pay | Admitting: Family Medicine

## 2021-07-27 NOTE — Progress Notes (Signed)
° °  [facility]e [facility]     MRN: [id number]      DOB: 1941-04-05   HPI Renee Collins is here for preop evaluation for clearance for C spine surgery Her ROS is positive only for  disabling neck and RUE pain Medications are reviewed and are as listed  ROS Denies recent fever or chills. Denies sinus pressure, nasal congestion, ear pain or sore throat. Denies chest congestion, productive cough or wheezing. Denies chest pains, palpitations and leg swelling Denies abdominal pain, nausea, vomiting,diarrhea or constipation.   Denies dysuria, frequency, hesitancy or incontinence.  Denies headaches, seizures,  Denies depression, anxiety or insomnia. Denies skin break down or rash.   PE  BP 136/79    Pulse 95    Ht 5\' 6"  (9.373 m)    Wt 196 lb 1.3 oz (88.9 kg)    SpO2 96%    BMI 31.65 kg/m   Patient alert and oriented and in no cardiopulmonary distress.  HEENT: No facial asymmetry, EOMI,     Neck decreased ROM .  Chest: Clear to auscultation bilaterally.  CVS: S1, S2 , systolic murmur, no S3.Regular rate. EKG: frequent PVC's and possible RVH, which is new in 06/2021  ABD: Soft non tender.   Ext: No edema  MS: Adequate ROM spine, shoulders, hips and knees.  Skin: Intact, no ulcerations or rash noted.  Psych: Good eye contact, normal affect. Memory intact not anxious or depressed appearing.  CNS: CN 2-12 intact, power,  normal throughout.no focal deficits noted.   Assessment & Plan  Abnormal EKG Mild AS with change in EKG, refer to cardiology for preop cardiology clearance, soonest available. Has echo scheduled in Feb to f/u AS  Pre-op evaluation CXR, clear lungs, normal heart size, lg hiatal hernia Urine C/S : negative for infection, CBC, normal, has CKD which is stable Cardiology to clear from cardiac standpoint Medically cleared otherwise

## 2021-07-27 NOTE — Assessment & Plan Note (Addendum)
CXR, clear lungs, normal heart size, lg hiatal hernia Urine C/S : negative for infection, CBC, normal, has CKD which is stable Cardiology to clear from cardiac standpoint Medically cleared otherwise

## 2021-07-27 NOTE — Assessment & Plan Note (Signed)
Mild AS with change in EKG, refer to cardiology for preop cardiology clearance, soonest available. Has echo scheduled in Feb to f/u AS

## 2021-07-28 NOTE — Progress Notes (Signed)
Cardiology Office Note:    Date:  07/30/2021   ID:  Renee Collins, DOB 1941-05-05, MRN [id number]  PCP:  [name], MD   [name] Providers Cardiologist:  [name], MD Referring MD: [name], MD   Chief Complaint/Reason for Referral: Preoperative cardiac evaluation for noncardiac surgery  ASSESSMENT:    Pre-op evaluation  Aortic stenosis, mild  Essential hypertension    PLAN:    In order of problems listed above:  1.  The patient is able to perform 4 METS of activity without any signs or symptoms of angina or dyspnea on exertion.  She has at low perioperative risk for cardiovascular complication.  She has an echocardiogram to be done in a few weeks time.  Her exam is not consistent with severe aortic stenosis however I think it makes sense to follow-up on this prior to proceeding to surgery.  2.  The patient's echocardiogram which I reviewed is reassuring with only mild aortic valve stenosis about 11 months ago.  She has repeat echocardiogram in a few weeks time.  3.  Blood pressure is above goal today; if elevated during next provider interaction then increasing her Lotensin may be in order.             Dispo:  No follow-ups on file.     Medication Adjustments/Labs and Tests Ordered: Current medicines are reviewed at length with the patient today.  Concerns regarding medicines are outlined above.   Tests Ordered: No orders of the defined types were placed in this encounter.   Medication Changes: No orders of the defined types were placed in this encounter.   History of Present Illness:    The patient is a 81 y.o. female with the indicated medical history here for preoperative cardiac evaluation for cervical spine surgery.  I am seeing the patient in an expedited fashion as the doctor of the day and the patient is managed by Dr. [name] regularly.  The patient is able to do all of her activities of daily living.  She was walking 2  miles 4 times a week prior to [place].  She is able to walk 1 mile but she is only done this a few times.  She goes grocery shopping and takes care of her family members who have cancer.  She is able to do all of her activities of daily living without any symptoms of angina or dyspnea.  She has had no presyncope, syncope, palpitations, paroxysmal atrial dyspnea, orthopnea.  She is otherwise well without significant complaints.       Previous Medical History: Past Medical History:  Diagnosis Date   Anemia    Chronic back pain    Depression    Diabetes mellitus    GERD (gastroesophageal reflux disease)    Hyperlipidemia    Hypertension    Hypothyroidism      Current Medications: Current Meds  Medication Sig   acetaminophen (TYLENOL) 650 MG CR tablet Take 650 mg by mouth every 8 (eight) hours as needed for pain.    amLODipine (NORVASC) 10 MG tablet TAKE 1 TABLET BY MOUTH  DAILY   aspirin 81 MG EC tablet Take 1 tablet (81 mg total) by mouth daily.   benazepril (LOTENSIN) 5 MG tablet Take 5 mg by mouth daily.   buPROPion (WELLBUTRIN XL) 150 MG 24 hr tablet TAKE 1 TABLET BY MOUTH  DAILY   calcitRIOL (ROCALTROL) 0.25 MCG capsule Take by mouth.   Calcium-Magnesium-Vitamin D (CALCIUM 1200+D3 PO)  Take 1 tablet by mouth 2 (two) times daily.   diphenhydrAMINE (BENADRYL) 25 mg capsule Take 25 mg by mouth at bedtime as needed (for allergies/sleep.).    gabapentin (NEURONTIN) 100 MG capsule TAKE 1 TO 2 CAPSULES BY  MOUTH 3 TIMES DAILY   glucosamine-chondroitin 500-400 MG tablet Take 1 tablet by mouth 3 (three) times daily. Move Free is brand name.   hydrochlorothiazide (HYDRODIURIL) 25 MG tablet TAKE 1 TABLET BY MOUTH  DAILY   levothyroxine (SYNTHROID) 88 MCG tablet TAKE 1 TABLET BY MOUTH ONCE DAILY 1/2 HOUR BEFORE FIRST MEAL OF THE DAY. DRINK  WATER   Multiple Vitamin (MULTIVITAMIN WITH MINERALS) TABS tablet Take 1 tablet by mouth daily.   omeprazole (PRILOSEC) 20 MG capsule Take 20 mg by mouth  daily as needed (for acid reflux.).    pravastatin (PRAVACHOL) 20 MG tablet TAKE 1 TABLET BY MOUTH IN  THE EVENING     Allergies:    Patient has no known allergies.   Social History:   Social History   Tobacco Use   Smoking status: Never   Smokeless tobacco: Never  Vaping Use   Vaping Use: Never used  Substance Use Topics   Alcohol use: No   Drug use: No     Family Hx: Family History  Problem Relation Age of Onset   Heart attack Mother 79   Heart disease Mother    Cancer Mother        type unknown   Diabetes Mother    Kidney failure Father    Kidney disease Father    Myasthenia gravis Brother    Diabetes Sister    Breast cancer Sister 89   Heart disease Brother    Diabetes Sister    Sickle cell anemia Sister    Cancer Sister        unknown type     Review of Systems:   Please see the history of present illness.    All other systems reviewed and are negative.     EKGs/Labs/Other Test Reviewed:    EKG: Sinus rhythm with PVCs  Prior CV studies: 2/22 TTE  1. Left ventricular ejection fraction, by estimation, is 60 to 65%. The  left ventricle has normal function. The left ventricle has no regional  wall motion abnormalities. There is moderate left ventricular hypertrophy.  Left ventricular diastolic  parameters are consistent with Grade I diastolic dysfunction (impaired  relaxation).   2. Right ventricular systolic function is normal. The right ventricular  size is normal.   3. Prominant shadowing artifact in LA.   4. The mitral valve is degenerative. Trivial mitral valve regurgitation.  No evidence of mitral stenosis. Severe mitral annular calcification.   5. The aortic valve is tricuspid. There is moderate calcification of the  aortic valve. There is moderate thickening of the aortic valve. Aortic  valve regurgitation is not visualized. Mild aortic valve stenosis.   6. The inferior vena cava is normal in size with greater than 50%  respiratory  variability, suggesting right atrial pressure of 3 mmHg.  Imaging studies that I have independently reviewed today: Echocardiogram  Recent Labs: 07/06/2021: TSH 1.780 07/24/2021: ALT 23; BUN 29; Creatinine, Ser 1.69; Hemoglobin 12.0; Platelets 233; Potassium 4.0; Sodium 136   Recent Lipid Panel Lab Results  Component Value Date/Time   CHOL 179 07/24/2021 02:02 PM   TRIG 70 07/24/2021 02:02 PM   HDL 67 07/24/2021 02:02 PM   LDLCALC 99 07/24/2021 02:02 PM   LDLCALC 79 11/30/2019  11:45 AM    Risk Assessment/Calculations:          Physical Exam:    VS:  BP (!) 142/84    Pulse 74    Ht 5\' 6"  (1.676 m)    Wt 196 lb (88.9 kg)    SpO2 98%    BMI 31.64 kg/m    Wt Readings from Last 3 Encounters:  07/30/21 196 lb (88.9 kg)  07/24/21 196 lb 1.3 oz (88.9 kg)  07/10/21 197 lb 1.9 oz (89.4 kg)    GENERAL:  No apparent distress, AOx3 HEENT:  No carotid bruits, +2 carotid impulses, no scleral icterus CAR: RRR  2/6 SEM, gallops, rubs, or thrills RES:  Clear to auscultation bilaterally ABD:  Soft, nontender, nondistended, positive bowel sounds x 4 VASC:  +2 radial pulses, +2 carotid pulses, palpable pedal pulses NEURO:  CN 2-12 grossly intact; motor and sensory grossly intact PSYCH:  No active depression or anxiety EXT:  No edema, ecchymosis, or cyanosis  Signed, Early Osmond, MD  07/30/2021 1:19 PM    [address], [place], [place]  [address] Phone: [phone]; Fax: [phone]   Note:  This document was prepared using Dragon voice recognition software and may include unintentional dictation errors.

## 2021-07-30 ENCOUNTER — Ambulatory Visit: Payer: Medicare Other | Admitting: Internal Medicine

## 2021-07-30 ENCOUNTER — Other Ambulatory Visit: Payer: Self-pay

## 2021-07-30 ENCOUNTER — Encounter: Payer: Self-pay | Admitting: Internal Medicine

## 2021-07-30 ENCOUNTER — Other Ambulatory Visit: Payer: Self-pay | Admitting: Family Medicine

## 2021-07-30 VITALS — BP 142/84 | HR 74 | Ht 66.0 in | Wt 196.0 lb

## 2021-07-30 DIAGNOSIS — Z01818 Encounter for other preprocedural examination: Secondary | ICD-10-CM | POA: Diagnosis not present

## 2021-07-30 DIAGNOSIS — I10 Essential (primary) hypertension: Secondary | ICD-10-CM

## 2021-07-30 DIAGNOSIS — I350 Nonrheumatic aortic (valve) stenosis: Secondary | ICD-10-CM | POA: Diagnosis not present

## 2021-07-30 NOTE — Patient Instructions (Signed)
Medication Instructions:  NO CHANGES *If you need a refill on your cardiac medications before your next appointment, please call your pharmacy*   Lab Work: NONE If you have labs (blood work) drawn today and your tests are completely normal, you will receive your results only by: [place] (if you have MyChart) OR A paper copy in the mail If you have any lab test that is abnormal or we need to change your treatment, we will call you to review the results.   Testing/Procedures: NONE   ECHO AS PLANNED  Follow-Up: At [facility], you and your health needs are our priority.  As part of our continuing mission to provide you with exceptional heart care, we have created designated Provider Care Teams.  These Care Teams include your primary Cardiologist (physician) and Advanced Practice Providers (APPs -  Physician Assistants and Nurse Practitioners) who all work together to provide you with the care you need, when you need it.  We recommend signing up for the patient portal called "MyChart".  Sign up information is provided on this After Visit Summary.  MyChart is used to connect with patients for Virtual Visits (Telemedicine).  Patients are able to view lab/test results, encounter notes, upcoming appointments, etc.  Non-urgent messages can be sent to your provider as well.   To learn more about what you can do with MyChart, go to [email or web address].    Your next appointment:   AS PLANNED  The format for your next appointment:

## 2021-08-03 DIAGNOSIS — E1122 Type 2 diabetes mellitus with diabetic chronic kidney disease: Secondary | ICD-10-CM | POA: Diagnosis not present

## 2021-08-03 DIAGNOSIS — E1129 Type 2 diabetes mellitus with other diabetic kidney complication: Secondary | ICD-10-CM | POA: Diagnosis not present

## 2021-08-03 DIAGNOSIS — N189 Chronic kidney disease, unspecified: Secondary | ICD-10-CM | POA: Diagnosis not present

## 2021-08-03 DIAGNOSIS — R809 Proteinuria, unspecified: Secondary | ICD-10-CM | POA: Diagnosis not present

## 2021-08-03 DIAGNOSIS — E211 Secondary hyperparathyroidism, not elsewhere classified: Secondary | ICD-10-CM | POA: Diagnosis not present

## 2021-08-07 DIAGNOSIS — N189 Chronic kidney disease, unspecified: Secondary | ICD-10-CM | POA: Diagnosis not present

## 2021-08-07 DIAGNOSIS — E1129 Type 2 diabetes mellitus with other diabetic kidney complication: Secondary | ICD-10-CM | POA: Diagnosis not present

## 2021-08-07 DIAGNOSIS — D472 Monoclonal gammopathy: Secondary | ICD-10-CM | POA: Diagnosis not present

## 2021-08-07 DIAGNOSIS — E1122 Type 2 diabetes mellitus with diabetic chronic kidney disease: Secondary | ICD-10-CM | POA: Diagnosis not present

## 2021-08-07 DIAGNOSIS — R809 Proteinuria, unspecified: Secondary | ICD-10-CM | POA: Diagnosis not present

## 2021-08-07 DIAGNOSIS — I129 Hypertensive chronic kidney disease with stage 1 through stage 4 chronic kidney disease, or unspecified chronic kidney disease: Secondary | ICD-10-CM | POA: Diagnosis not present

## 2021-08-07 DIAGNOSIS — E211 Secondary hyperparathyroidism, not elsewhere classified: Secondary | ICD-10-CM | POA: Diagnosis not present

## 2021-08-07 DIAGNOSIS — D638 Anemia in other chronic diseases classified elsewhere: Secondary | ICD-10-CM | POA: Diagnosis not present

## 2021-08-17 ENCOUNTER — Other Ambulatory Visit: Payer: Self-pay

## 2021-08-17 ENCOUNTER — Telehealth: Payer: Self-pay | Admitting: *Deleted

## 2021-08-17 ENCOUNTER — Ambulatory Visit (HOSPITAL_COMMUNITY)
Admission: RE | Admit: 2021-08-17 | Discharge: 2021-08-17 | Disposition: A | Discharge status: Home / Self Care | Payer: Medicare Other | Source: Ambulatory Visit | Attending: Cardiovascular Disease | Admitting: Cardiovascular Disease

## 2021-08-17 DIAGNOSIS — I350 Nonrheumatic aortic (valve) stenosis: Secondary | ICD-10-CM | POA: Insufficient documentation

## 2021-08-17 LAB — ECHOCARDIOGRAM COMPLETE
AR max vel: 1.5 cm2
AV Area VTI: 1.54 cm2
AV Area mean vel: 1.43 cm2
AV Mean grad: 8.7 mmHg
AV Peak grad: 17.4 mmHg
Ao pk vel: 2.08 m/s
Area-P 1/2: 5.02 cm2
MV VTI: 1.79 cm2
S' Lateral: 2 cm

## 2021-08-17 NOTE — Telephone Encounter (Signed)
-----   Message from Early Osmond, MD sent at 08/17/2021  2:16 PM EST ----- Regarding: Pre-op clearance Her echo shows only mild AS.  She is at low risk for periop CV complication.  Thanks. ----- Message ----- From: Josue Hector, MD Sent: 08/17/2021   2:07 PM EST To: Early Osmond, MD  I think this is your patient  ----- Message ----- From: Interface, [employer] Sent: 08/17/2021   1:11 PM EST To: Josue Hector, MD

## 2021-08-17 NOTE — Telephone Encounter (Signed)
Result message was sent to me today by Dr. Ali Lowe. Dr. Ali Lowe saw the pt for pre op clearance and ordered further testing. See notes from Dr. Ali Lowe. I will forward this to pre op pool for any final notes. Protocol is if the MD has seen the pt and orders further testing to clear the pt, the MD once testing is resulted, MD has cleared the pt, the results do not come back to pre op. MD or his/her nurse should fax over the given clearance to the requesting office. I will however forward this to pre op provider today for any final notes needed.

## 2021-08-17 NOTE — Progress Notes (Incomplete)
*  PRELIMINARY RESULTS* Echocardiogram 2D Echocardiogram has been performed.  Renee Collins 08/17/2021, 12:12 PM

## 2021-08-18 ENCOUNTER — Other Ambulatory Visit: Payer: Self-pay | Admitting: Neurosurgery

## 2021-08-18 NOTE — Telephone Encounter (Signed)
° °  Patient Name: Renee Collins  DOB: [birth year] MRN: [id number]  Primary Cardiologist: Dr. Ali Lowe  Chart reviewed as part of pre-operative protocol coverage.  Patient was seen recently in clinic by Dr. Ali Lowe for pre-op evaluation and he recommended echocardiogram. Per his message below, "Her echo shows only mild AS.  She is at low risk for periop CV complication." Agree with message below - no additional needs identified at this time from preop APP, will route to callback to route clearance to requesting party or have provider/nursing team do this. (No clearance request available in Epic at this time.   Charlie Pitter, PA-C 08/18/2021, 8:52 AM

## 2021-08-18 NOTE — Telephone Encounter (Signed)
Call placed to pt, she will contact the surgeon's office to have them send over formal surgical clearance request to be completed.

## 2021-08-19 ENCOUNTER — Other Ambulatory Visit: Payer: Self-pay

## 2021-08-19 DIAGNOSIS — E041 Nontoxic single thyroid nodule: Secondary | ICD-10-CM

## 2021-08-19 MED ORDER — LEVOTHYROXINE SODIUM 88 MCG PO TABS: ORAL_TABLET | ORAL | 5 refills | Status: DC

## 2021-08-20 NOTE — Telephone Encounter (Signed)
° °  Pre-operative Risk Assessment    Patient Name: Renee Collins  DOB: Nov 04, 1940 MRN: [id number]      Request for Surgical Clearance    Procedure:   ACDF C3-4, C4-5  Date of Surgery:  Clearance 09/15/21                                 Surgeon:  DR. Duffy Rhody Surgeon's Group or Practice Name:  [facility]   Phone number:  [phone] Fax number:  [phone]   Type of Clearance Requested:   - Medical  - Pharmacy:  Hold Aspirin     Type of Anesthesia:  General    Additional requests/questions:    Jiles Prows   08/20/2021, 5:29 PM

## 2021-08-21 NOTE — Telephone Encounter (Signed)
° °  Primary Cardiologist: Jenkins Rouge, MD  Chart reviewed as part of pre-operative protocol coverage. Given past medical history and time since last visit, based on ACC/AHA guidelines, Renee Collins would be at acceptable risk for the planned procedure without further cardiovascular testing.   Patient's aspirin is prescribed by a noncardiac provider for noncardiac reasons.  Recommendations on aspirin holding will need to come from prescribing provider.  I will route this recommendation to the requesting party via Epic fax function and remove from pre-op pool.  Please call with questions.  Jossie Ng. Thamar Holik NP-C    08/21/2021, 8:56 AM [address] Office [phone] Fax [phone]

## 2021-09-01 ENCOUNTER — Other Ambulatory Visit: Payer: Self-pay | Admitting: Family Medicine

## 2021-09-07 ENCOUNTER — Other Ambulatory Visit: Payer: Self-pay | Admitting: Neurosurgery

## 2021-09-10 NOTE — Progress Notes (Signed)
Surgical Instructions ? ? ? Your procedure is scheduled on Tuesday, March 21st, 2023. ? ? Report to [facility] Main Entrance "A" at 05:30 A.M., then check in with the Admitting office. ? Call this number if you have problems the morning of surgery: ? [phone] ? ? If you have any questions prior to your surgery date call [phone]: Open Monday-Friday 8am-4pm ? ? ? Remember: ? Do not eat or drink after midnight the night before your surgery ?  ? Take these medicines the morning of surgery with A SIP OF WATER:  ? ?amLODipine (NORVASC)  ?buPROPion (WELLBUTRIN XL) ?gabapentin (NEURONTIN)  ?levothyroxine (SYNTHROID) ? ?If needed: ? ?acetaminophen (TYLENOL) ?omeprazole (PRILOSEC) ? ?Follow your surgeon's instructions on when to stop Aspirin.  If no instructions were given by your surgeon then you will need to call the office to get those instructions.    ? ?As of today, STOP taking any Aspirin (unless otherwise instructed by your surgeon) Aleve, Naproxen, Ibuprofen, Motrin, Advil, Goody's, BC's, all herbal medications, fish oil, and all vitamins. ? ? ? ?HOW TO MANAGE YOUR DIABETES ?BEFORE AND AFTER SURGERY ? ?Why is it important to control my blood sugar before and after surgery? ?Improving blood sugar levels before and after surgery helps healing and can limit problems. ?A way of improving blood sugar control is eating a healthy diet by: ? Eating less sugar and carbohydrates ? Increasing activity/exercise ? Talking with your doctor about reaching your blood sugar goals ?High blood sugars (greater than 180 mg/dL) can raise your risk of infections and slow your recovery, so you will need to focus on controlling your diabetes during the weeks before surgery. ?Make sure that the doctor who takes care of your diabetes knows about your planned surgery including the date and location. ? ?How do I manage my blood sugar before surgery? ?Check your blood sugar at least 4 times a day, starting 2 days before surgery, to make  sure that the level is not too high or low. ? ?Check your blood sugar the morning of your surgery when you wake up and every 2 hours until you get to the Short Stay unit. ? ?If your blood sugar is less than 70 mg/dL, you will need to treat for low blood sugar: ?Do not take insulin. ?Treat a low blood sugar (less than 70 mg/dL) with ? cup of clear juice (cranberry or apple), 4 glucose tablets, OR glucose gel. ?Recheck blood sugar in 15 minutes after treatment (to make sure it is greater than 70 mg/dL). If your blood sugar is not greater than 70 mg/dL on recheck, call [phone] for further instructions. ?Report your blood sugar to the short stay nurse when you get to Short Stay. ? ?If you are admitted to the hospital after surgery: ?Your blood sugar will be checked by the staff and you will probably be given insulin after surgery (instead of oral diabetes medicines) to make sure you have good blood sugar levels. ?The ? goal for blood sugar control after surgery is 80-180 mg/dL.  ? ? ?The day of surgery: ?         ?Do not wear jewelry or makeup ?Do not wear lotions, powders, perfumes, or deodorant. ?Do not shave 48 hours prior to surgery.  ?Do not bring valuables to the hospital. ?Do not wear nail polish, gel polish, artificial nails, or any other type of covering on natural nails (fingers and toes) ?If you have artificial nails or gel coating that need to be removed  by a nail salon, please have this removed prior to surgery. Artificial nails or gel coating may interfere with anesthesia's ability to adequately monitor your vital signs. ? ? ?[facility] is not responsible for any belongings or valuables. .  ? ?Do NOT Smoke (Tobacco/Vaping)  24 hours prior to your procedure ? ?If you use a CPAP at night, you may bring your mask for your overnight stay. ?  ?Contacts, glasses, hearing aids, dentures or partials may not be worn into surgery, please bring cases for these belongings ?  ?For patients admitted to the  hospital, discharge time will be determined by your treatment team. ?  ?Patients discharged the day of surgery will not be allowed to drive home, and someone needs to stay with them for 24 hours. ? ?NO VISITORS WILL BE ALLOWED IN PRE-OP WHERE PATIENTS ARE PREPPED FOR SURGERY.  ONLY 1 SUPPORT PERSON MAY BE PRESENT IN THE WAITING ROOM WHILE YOU ARE IN SURGERY.  IF YOU ARE TO BE ADMITTED, ONCE YOU ARE IN YOUR ROOM YOU WILL BE ALLOWED TWO (2) VISITORS. 1 (ONE) VISITOR MAY STAY OVERNIGHT BUT MUST ARRIVE TO THE ROOM BY 8pm.  Minor children may have two parents present. Special consideration for safety and communication needs will be reviewed on a case by case basis. ? ?Special instructions:   ? ?Oral Hygiene is also important to reduce your risk of infection.  Remember - BRUSH YOUR TEETH THE MORNING OF SURGERY WITH YOUR REGULAR TOOTHPASTE ? ? ?[place]- Preparing For Surgery ? ?Before surgery, you can play an important role. Because skin is not sterile, your skin needs to be as free of germs as possible. You can reduce the number of germs on your skin by washing with CHG (chlorahexidine gluconate) Soap before surgery.  CHG is an antiseptic cleaner which kills germs and bonds with the skin to continue killing germs even after washing.   ? ? ?Please do not use if you have an allergy to CHG or antibacterial soaps. If your skin becomes reddened/irritated stop using the CHG.  ?Do not shave (including legs and underarms) for at least 48 hours prior to first CHG shower. It is OK to shave your face. ? ?Please follow these instructions carefully. ?  ? ? Shower the NIGHT BEFORE SURGERY and the MORNING OF SURGERY with CHG Soap.  ? If you chose to wash your hair, wash your hair first as usual with your normal shampoo. After you shampoo, rinse your hair and body thoroughly to remove the shampoo.  Then [employer] and genitals (private parts) with your normal soap and rinse thoroughly to remove soap. ? ?After that Use CHG Soap as you  would any other liquid soap. You can apply CHG directly to the skin and wash gently with a scrungie or a clean washcloth.  ? ?Apply the CHG Soap to your body ONLY FROM THE NECK DOWN.  Do not use on open wounds or open sores. Avoid contact with your eyes, ears, mouth and genitals (private parts). Wash Face and genitals (private parts)  with your normal soap.  ? ?Wash thoroughly, paying special attention to the area where your surgery will be performed. ? ?Thoroughly rinse your body with warm water from the neck down. ? ?DO NOT shower/wash with your normal soap after using and rinsing off the CHG Soap. ? ?Pat yourself dry with a CLEAN TOWEL. ? ?Wear CLEAN PAJAMAS to bed the night before surgery ? ?Place CLEAN SHEETS on your bed the night  before your surgery ? ?DO NOT SLEEP WITH PETS. ? ? ?Day of Surgery: ? ?Take a shower with CHG soap. ?Wear Clean/Comfortable clothing the morning of surgery ?Do not apply any deodorants/lotions.   ?Remember to brush your teeth WITH YOUR REGULAR TOOTHPASTE. ? ?  ?Please read over the following fact sheets that you were given.   ?

## 2021-09-11 ENCOUNTER — Encounter (HOSPITAL_COMMUNITY): Payer: Self-pay

## 2021-09-11 ENCOUNTER — Encounter (HOSPITAL_COMMUNITY)
Admission: RE | Admit: 2021-09-11 | Discharge: 2021-09-11 | Disposition: A | Discharge status: Home / Self Care | Payer: Medicare Other | Source: Ambulatory Visit | Attending: Neurosurgery | Admitting: Neurosurgery

## 2021-09-11 ENCOUNTER — Other Ambulatory Visit: Payer: Self-pay

## 2021-09-11 VITALS — BP 147/78 | HR 92 | Temp 98.0°F | Resp 17 | Ht 65.0 in | Wt 197.8 lb

## 2021-09-11 DIAGNOSIS — I129 Hypertensive chronic kidney disease with stage 1 through stage 4 chronic kidney disease, or unspecified chronic kidney disease: Secondary | ICD-10-CM | POA: Insufficient documentation

## 2021-09-11 DIAGNOSIS — R011 Cardiac murmur, unspecified: Secondary | ICD-10-CM | POA: Diagnosis not present

## 2021-09-11 DIAGNOSIS — N1832 Chronic kidney disease, stage 3b: Secondary | ICD-10-CM | POA: Insufficient documentation

## 2021-09-11 DIAGNOSIS — E785 Hyperlipidemia, unspecified: Secondary | ICD-10-CM | POA: Diagnosis not present

## 2021-09-11 DIAGNOSIS — Z01812 Encounter for preprocedural laboratory examination: Secondary | ICD-10-CM | POA: Insufficient documentation

## 2021-09-11 DIAGNOSIS — M4712 Other spondylosis with myelopathy, cervical region: Secondary | ICD-10-CM | POA: Diagnosis not present

## 2021-09-11 DIAGNOSIS — Z01818 Encounter for other preprocedural examination: Secondary | ICD-10-CM

## 2021-09-11 HISTORY — DX: Chronic kidney disease, unspecified: N18.9

## 2021-09-11 HISTORY — DX: Cardiac murmur, unspecified: R01.1

## 2021-09-11 HISTORY — DX: Prediabetes: R73.03

## 2021-09-11 LAB — CBC
HCT: 34.9 % — ABNORMAL LOW (ref 36.0–46.0)
Hemoglobin: 11 g/dL — ABNORMAL LOW (ref 12.0–15.0)
MCH: 27.8 pg (ref 26.0–34.0)
MCHC: 31.5 g/dL (ref 30.0–36.0)
MCV: 88.1 fL (ref 80.0–100.0)
Platelets: 211 10*3/uL (ref 150–400)
RBC: 3.96 MIL/uL (ref 3.87–5.11)
RDW: 13.1 % (ref 11.5–15.5)
WBC: 5 10*3/uL (ref 4.0–10.5)
nRBC: 0 % (ref 0.0–0.2)

## 2021-09-11 LAB — BASIC METABOLIC PANEL
Anion gap: 8 (ref 5–15)
BUN: 20 mg/dL (ref 8–23)
CO2: 22 mmol/L (ref 22–32)
Calcium: 9.3 mg/dL (ref 8.9–10.3)
Chloride: 107 mmol/L (ref 98–111)
Creatinine, Ser: 1.64 mg/dL — ABNORMAL HIGH (ref 0.44–1.00)
GFR, Estimated: 31 mL/min — ABNORMAL LOW (ref >60)
Glucose, Bld: 114 mg/dL — ABNORMAL HIGH (ref 70–99)
Potassium: 3.5 mmol/L (ref 3.5–5.1)
Sodium: 137 mmol/L (ref 135–145)

## 2021-09-11 LAB — TYPE AND SCREEN
ABO/RH(D): O POS
Antibody Screen: NEGATIVE

## 2021-09-11 LAB — SURGICAL PCR SCREEN

## 2021-09-11 NOTE — Progress Notes (Signed)
PCP: [name], MD ?Cardiologist: [name], MD ? ?EKG:07/24/21 ?CXR:na ?ECHO: 08/17/21 ?Stress Test: denies ?Cardiac Cath: denies ? ?Fasting Blood Sugar- pre-diabetic.  Does not check glucose ?Checks Blood Sugar__0_ times a day ? ?ASA: Last dose 09/08/21 ?Blood Thinner: No ? ?OSA/CPAP: No ? ?Covid test not needed ? ?Anesthesia Review: Yes, recent Echo for heart murmur.  ? ?Patient denies shortness of breath, fever, cough, and chest pain at PAT appointment. ? ?Patient verbalized understanding of instructions provided today at the PAT appointment.  Patient asked to review instructions at home and day of surgery.   ?

## 2021-09-14 NOTE — Progress Notes (Signed)
Anesthesia Chart Review: ? ?Follows with cardiology for history of HTN, HLD, murmur. Last seen by Dr. Ali Lowe 07/30/21 and surgery was discussed. Per note, "The patient is able to perform 4 METS of activity without any signs or symptoms of angina or dyspnea on exertion.  She has at low perioperative risk for cardiovascular complication.  She has an echocardiogram to be done in a few weeks time.  Her exam is not consistent with severe aortic stenosis however I think it makes sense to follow-up on this prior to proceeding to surgery."  Subsequent echo 08/17/2021 showed EF 65 to 70%, mild mitral stenosis with mean gradient 4 mmHg, mild AAS with mean gradient 8.7 mmHg.  Preop clearance per telephone encounter 08/21/21, "Chart reviewed as part of pre-operative protocol coverage. Given past medical history and time since last visit, based on ACC/AHA guidelines, Renee Collins would be at acceptable risk for the planned procedure without further cardiovascular testing. Patient's aspirin is prescribed by a noncardiac provider for noncardiac reasons.  Recommendations on aspirin holding will need to come from prescribing provider." ? ?History of CKD 3b/4, followed by nephrologist Dr. Theador Hawthorne. ? ?Preop labs reviewed, creatinine mildly elevated 1.64 (appears to be her baseline), mild anemia with hemoglobin 11.0, otherwise unremarkable. ? ?EKG 07/24/2021: Sinus rhythm with frequent PVCs.  Rate 88.  Possible right ventricular hypertrophy. ? ?TTE 08/17/2021: ? 1. Left ventricular ejection fraction, by estimation, is 65 to 70%. The  ?left ventricle has normal function. Left ventricular endocardial border  ?not optimally defined to evaluate regional wall motion. There is mild left  ?ventricular hypertrophy. Left  ?ventricular diastolic parameters are consistent with Grade I diastolic  ?dysfunction (impaired relaxation).  ? 2. Right ventricular systolic function is normal. The right ventricular  ?size is normal. There is normal pulmonary  artery systolic pressure.  ? 3. The mitral valve is abnormal. No evidence of mitral valve  ?regurgitation. Mild mitral stenosis.Mean gradient 4 mmHg. Severe mitral  ?annular calcification.  ? 4. The aortic valve was not well visualized. Aortic valve regurgitation  ?is not visualized. Mild aortic valve stenosis. Aortic valve mean gradient  ?measures 8.7 mmHg. Aortic valve peak gradient measures 17.4 mmHg. Aortic  ?valve area, by VTI measures 1.54  ?cm?.  ? 5. The inferior vena cava is normal in size with greater than 50%  ?respiratory variability, suggesting right atrial pressure of 3 mmHg. ? ? ?Renee Caldwell, PA-C ?[facility]esthesiology ?Phone [phone] ?09/14/2021 9:25 AM ? ?

## 2021-09-14 NOTE — Anesthesia Preprocedure Evaluation (Addendum)
Anesthesia Evaluation  ?Patient identified by MRN, date of birth, ID band ?Patient awake ? ? ? ?Reviewed: ?Allergy & Precautions, H&P , NPO status , Patient's Chart, lab work & pertinent test results ? ?Airway ?Mallampati: II ? ?TM Distance: >3 FB ?Neck ROM: Full ? ? ? Dental ?no notable dental hx. ?(+) Teeth Intact, Dental Advisory Given ?  ?Pulmonary ?neg pulmonary ROS,  ?  ?Pulmonary exam normal ?breath sounds clear to auscultation ? ? ? ? ? ? Cardiovascular ?Exercise Tolerance: Good ?hypertension, Pt. on medications ?+ Valvular Problems/Murmurs AS  ?Rhythm:Regular Rate:Normal ? ? ?  ?Neuro/Psych ?Depression negative neurological ROS ?   ? GI/Hepatic ?Neg liver ROS, GERD  Medicated,  ?Endo/Other  ?Hypothyroidism  ? Renal/GU ?Renal InsufficiencyRenal disease  ?negative genitourinary ?  ?Musculoskeletal ? ?(+) Arthritis , Osteoarthritis,   ? Abdominal ?  ?Peds ? Hematology ? ?(+) Blood dyscrasia, anemia ,   ?Anesthesia Other Findings ? ? Reproductive/Obstetrics ?negative OB ROS ? ?  ? ? ? ? ? ? ? ? ? ? ? ? ? ?  ?  ? ? ? ? ? ? ?Anesthesia Physical ?Anesthesia Plan ? ?ASA: 3 ? ?Anesthesia Plan: General  ? ?Post-op Pain Management: Tylenol PO (pre-op)*  ? ?Induction: Intravenous ? ?PONV Risk Score and Plan: 4 or greater and Ondansetron, Dexamethasone and Treatment may vary due to age or medical condition ? ?Airway Management Planned: Oral ETT ? ?Additional Equipment:  ? ?Intra-op Plan:  ? ?Post-operative Plan: Extubation in OR ? ?Informed Consent: I have reviewed the patients History and Physical, chart, labs and discussed the procedure including the risks, benefits and alternatives for the proposed anesthesia with the patient or authorized representative who has indicated his/her understanding and acceptance.  ? ? ? ?Dental advisory given ? ?Plan Discussed with: CRNA ? ?Anesthesia Plan Comments: (PAT note by Karoline Caldwell, PA-C: ?Follows with cardiology for history of HTN, HLD, murmur.  Last seen by Dr. Ali Lowe 07/30/21 and surgery was discussed. Per note, "The patient is able to perform 4 METS of activity without any signs or symptoms of angina or dyspnea on exertion. ?She has at low perioperative risk for cardiovascular complication. ?She has an echocardiogram to be done in a few weeks time. ?Her exam is not consistent with severe aortic stenosis however I think it makes sense to follow-up on this prior to proceeding to surgery."  Subsequent echo 08/17/2021 showed EF 65 to 70%, mild mitral stenosis with mean gradient 4 mmHg, mild AAS with mean gradient 8.7 mmHg.  Preop clearance per telephone encounter 08/21/21, "Chart reviewed as part of pre-operative protocol coverage. Given past medical history and time since last visit, based on ACC/AHA guidelines,?Renee Collins?would be at acceptable risk for the planned procedure without further cardiovascular testing. Patient's aspirin is prescribed by a noncardiac provider for noncardiac reasons. ?Recommendations on aspirin holding will need to come from prescribing provider." ? ?History of CKD 3b/4, followed by nephrologist Dr. Theador Hawthorne. ? ?Preop labs reviewed, creatinine mildly elevated 1.64 (appears to be her baseline), mild anemia with hemoglobin 11.0, otherwise unremarkable. ? ?EKG 07/24/2021: Sinus rhythm with frequent PVCs.  Rate 88.  Possible right ventricular hypertrophy. ? ?TTE 08/17/2021: ??1. Left ventricular ejection fraction, by estimation, is 65 to 70%. The  ?left ventricle has normal function. Left ventricular endocardial border  ?not optimally defined to evaluate regional wall motion. There is mild left  ?ventricular hypertrophy. Left  ?ventricular diastolic parameters are consistent with Grade I diastolic  ?dysfunction (impaired relaxation).  ??2. Right ventricular  systolic function is normal. The right ventricular  ?size is normal. There is normal pulmonary artery systolic pressure.  ??3. The mitral valve is abnormal. No evidence of mitral  valve  ?regurgitation. Mild mitral stenosis.Mean gradient 4 mmHg. Severe mitral  ?annular calcification.  ??4. The aortic valve was not well visualized. Aortic valve regurgitation  ?is not visualized. Mild aortic valve stenosis. Aortic valve mean gradient  ?measures 8.7 mmHg. Aortic valve peak gradient measures 17.4 mmHg. Aortic  ?valve area, by VTI measures 1.54  ?cm?.  ??5. The inferior vena cava is normal in size with greater than 50%  ?respiratory variability, suggesting right atrial pressure of 3 mmHg. ?)  ? ? ? ? ?Anesthesia Quick Evaluation ? ?

## 2021-09-15 ENCOUNTER — Ambulatory Visit (HOSPITAL_COMMUNITY): Payer: Medicare Other | Admitting: Physician Assistant

## 2021-09-15 ENCOUNTER — Other Ambulatory Visit: Payer: Self-pay

## 2021-09-15 ENCOUNTER — Ambulatory Visit (HOSPITAL_COMMUNITY)
Admission: RE | Admit: 2021-09-15 | Discharge: 2021-09-15 | Disposition: A | Discharge status: Home / Self Care | Payer: Medicare Other | Attending: Neurosurgery | Admitting: Neurosurgery

## 2021-09-15 ENCOUNTER — Encounter (HOSPITAL_COMMUNITY)
Admission: RE | Disposition: A | Discharge status: Home / Self Care | Payer: Self-pay | Source: Home / Self Care | Attending: Neurosurgery

## 2021-09-15 ENCOUNTER — Ambulatory Visit (HOSPITAL_BASED_OUTPATIENT_CLINIC_OR_DEPARTMENT_OTHER): Payer: Medicare Other | Admitting: Anesthesiology

## 2021-09-15 ENCOUNTER — Ambulatory Visit (HOSPITAL_COMMUNITY): Payer: Medicare Other

## 2021-09-15 DIAGNOSIS — R7303 Prediabetes: Secondary | ICD-10-CM | POA: Diagnosis not present

## 2021-09-15 DIAGNOSIS — M4802 Spinal stenosis, cervical region: Secondary | ICD-10-CM | POA: Insufficient documentation

## 2021-09-15 DIAGNOSIS — K219 Gastro-esophageal reflux disease without esophagitis: Secondary | ICD-10-CM | POA: Insufficient documentation

## 2021-09-15 DIAGNOSIS — M1990 Unspecified osteoarthritis, unspecified site: Secondary | ICD-10-CM | POA: Diagnosis not present

## 2021-09-15 DIAGNOSIS — D638 Anemia in other chronic diseases classified elsewhere: Secondary | ICD-10-CM | POA: Diagnosis not present

## 2021-09-15 DIAGNOSIS — E039 Hypothyroidism, unspecified: Secondary | ICD-10-CM | POA: Diagnosis not present

## 2021-09-15 DIAGNOSIS — G992 Myelopathy in diseases classified elsewhere: Secondary | ICD-10-CM | POA: Diagnosis not present

## 2021-09-15 DIAGNOSIS — I129 Hypertensive chronic kidney disease with stage 1 through stage 4 chronic kidney disease, or unspecified chronic kidney disease: Secondary | ICD-10-CM | POA: Diagnosis not present

## 2021-09-15 DIAGNOSIS — I10 Essential (primary) hypertension: Secondary | ICD-10-CM

## 2021-09-15 DIAGNOSIS — F32A Depression, unspecified: Secondary | ICD-10-CM | POA: Diagnosis not present

## 2021-09-15 DIAGNOSIS — N189 Chronic kidney disease, unspecified: Secondary | ICD-10-CM | POA: Diagnosis not present

## 2021-09-15 DIAGNOSIS — Z79899 Other long term (current) drug therapy: Secondary | ICD-10-CM | POA: Diagnosis not present

## 2021-09-15 DIAGNOSIS — M4322 Fusion of spine, cervical region: Secondary | ICD-10-CM | POA: Diagnosis not present

## 2021-09-15 DIAGNOSIS — E785 Hyperlipidemia, unspecified: Secondary | ICD-10-CM | POA: Insufficient documentation

## 2021-09-15 DIAGNOSIS — G959 Disease of spinal cord, unspecified: Secondary | ICD-10-CM | POA: Diagnosis not present

## 2021-09-15 DIAGNOSIS — Z981 Arthrodesis status: Secondary | ICD-10-CM | POA: Diagnosis not present

## 2021-09-15 DIAGNOSIS — M5031 Other cervical disc degeneration,  high cervical region: Secondary | ICD-10-CM | POA: Diagnosis not present

## 2021-09-15 HISTORY — PX: ANTERIOR CERVICAL DECOMP/DISCECTOMY FUSION: SHX1161

## 2021-09-15 LAB — ABO/RH: ABO/RH(D): O POS

## 2021-09-15 LAB — GLUCOSE, CAPILLARY: Glucose-Capillary: 120 mg/dL — ABNORMAL HIGH (ref 70–99)

## 2021-09-15 LAB — SURGICAL PCR SCREEN
MRSA, PCR: NEGATIVE
Staphylococcus aureus: NEGATIVE

## 2021-09-15 SURGERY — ANTERIOR CERVICAL DECOMPRESSION/DISCECTOMY FUSION 2 LEVELS
Anesthesia: General | Site: Neck

## 2021-09-15 MED ORDER — BUPIVACAINE HCL 0.5 % IJ SOLN
INTRAMUSCULAR | Status: DC | PRN
  Administered 2021-09-15: 3.5 mL

## 2021-09-15 MED ORDER — ROCURONIUM BROMIDE 10 MG/ML (PF) SYRINGE
PREFILLED_SYRINGE | INTRAVENOUS | Status: AC
  Filled 2021-09-15: qty 10

## 2021-09-15 MED ORDER — LIDOCAINE 2% (20 MG/ML) 5 ML SYRINGE
INTRAMUSCULAR | Status: DC | PRN
Start: 2021-09-15 — End: 2021-09-15
  Administered 2021-09-15: 60 mg via INTRAVENOUS

## 2021-09-15 MED ORDER — PHENYLEPHRINE HCL-NACL 20-0.9 MG/250ML-% IV SOLN
INTRAVENOUS | Status: AC
  Filled 2021-09-15: qty 500

## 2021-09-15 MED ORDER — LIDOCAINE-EPINEPHRINE 1 %-1:100000 IJ SOLN
INTRAMUSCULAR | Status: AC
  Filled 2021-09-15: qty 1

## 2021-09-15 MED ORDER — DOCUSATE SODIUM 100 MG PO CAPS: 100.0000 mg | ORAL_CAPSULE | Freq: Two times a day (BID) | ORAL | 2 refills | Status: DC

## 2021-09-15 MED ORDER — DEXAMETHASONE SODIUM PHOSPHATE 10 MG/ML IJ SOLN
INTRAMUSCULAR | Status: DC | PRN
  Administered 2021-09-15: 10 mg via INTRAVENOUS

## 2021-09-15 MED ORDER — PROPOFOL 10 MG/ML IV BOLUS
INTRAVENOUS | Status: DC | PRN
  Administered 2021-09-15 (×2): 50 mg via INTRAVENOUS

## 2021-09-15 MED ORDER — THROMBIN 5000 UNITS EX SOLR
CUTANEOUS | Status: DC | PRN
Start: 2021-09-15 — End: 2021-09-15
  Administered 2021-09-15 (×2): 5000 [IU] via TOPICAL

## 2021-09-15 MED ORDER — VASOPRESSIN 20 UNIT/ML IV SOLN
INTRAVENOUS | Status: AC
  Filled 2021-09-15: qty 1

## 2021-09-15 MED ORDER — ACETAMINOPHEN 500 MG PO TABS: 1000.0000 mg | ORAL_TABLET | Freq: Once | ORAL | Status: AC

## 2021-09-15 MED ORDER — CHLORHEXIDINE GLUCONATE 0.12 % MT SOLN: 15.0000 mL | Freq: Once | OROMUCOSAL | Status: AC

## 2021-09-15 MED ORDER — CEFAZOLIN SODIUM-DEXTROSE 2-4 GM/100ML-% IV SOLN
2.0000 g | INTRAVENOUS | Status: AC
Start: 2021-09-15 — End: 2021-09-15
  Administered 2021-09-15: 2 g via INTRAVENOUS

## 2021-09-15 MED ORDER — CHLORHEXIDINE GLUCONATE CLOTH 2 % EX PADS: 6.0000 | MEDICATED_PAD | Freq: Once | CUTANEOUS | Status: DC

## 2021-09-15 MED ORDER — ONDANSETRON HCL 4 MG/2ML IJ SOLN
INTRAMUSCULAR | Status: DC | PRN
  Administered 2021-09-15: 4 mg via INTRAVENOUS

## 2021-09-15 MED ORDER — PHENYLEPHRINE HCL-NACL 20-0.9 MG/250ML-% IV SOLN
INTRAVENOUS | Status: DC | PRN
  Administered 2021-09-15: 20 ug/min via INTRAVENOUS

## 2021-09-15 MED ORDER — PROPOFOL 10 MG/ML IV BOLUS
INTRAVENOUS | Status: AC
  Filled 2021-09-15: qty 20

## 2021-09-15 MED ORDER — BUPIVACAINE HCL (PF) 0.5 % IJ SOLN
INTRAMUSCULAR | Status: AC
  Filled 2021-09-15: qty 30

## 2021-09-15 MED ORDER — DEXMEDETOMIDINE (PRECEDEX) IN NS 20 MCG/5ML (4 MCG/ML) IV SYRINGE
PREFILLED_SYRINGE | INTRAVENOUS | Status: DC | PRN
  Administered 2021-09-15: 4 ug via INTRAVENOUS

## 2021-09-15 MED ORDER — SUCCINYLCHOLINE CHLORIDE 200 MG/10ML IV SOSY
PREFILLED_SYRINGE | INTRAVENOUS | Status: AC
  Filled 2021-09-15: qty 10

## 2021-09-15 MED ORDER — THROMBIN 5000 UNITS EX SOLR
CUTANEOUS | Status: AC
  Filled 2021-09-15: qty 15000

## 2021-09-15 MED ORDER — FENTANYL CITRATE (PF) 250 MCG/5ML IJ SOLN
INTRAMUSCULAR | Status: AC
  Filled 2021-09-15: qty 5

## 2021-09-15 MED ORDER — OXYCODONE-ACETAMINOPHEN 5-325 MG PO TABS: 1.0000 | ORAL_TABLET | ORAL | 0 refills | Status: DC | PRN

## 2021-09-15 MED ORDER — SUGAMMADEX SODIUM 200 MG/2ML IV SOLN
INTRAVENOUS | Status: DC | PRN
  Administered 2021-09-15: 400 mg via INTRAVENOUS

## 2021-09-15 MED ORDER — FENTANYL CITRATE (PF) 100 MCG/2ML IJ SOLN: 25.0000 ug | INTRAMUSCULAR | Status: DC | PRN

## 2021-09-15 MED ORDER — THROMBIN 5000 UNITS EX SOLR: OROMUCOSAL | Status: DC | PRN

## 2021-09-15 MED ORDER — LACTATED RINGERS IV SOLN: INTRAVENOUS | Status: DC

## 2021-09-15 MED ORDER — 0.9 % SODIUM CHLORIDE (POUR BTL) OPTIME
TOPICAL | Status: DC | PRN
  Administered 2021-09-15: 1000 mL

## 2021-09-15 MED ORDER — LIDOCAINE-EPINEPHRINE 1 %-1:100000 IJ SOLN
INTRAMUSCULAR | Status: DC | PRN
  Administered 2021-09-15: 3.5 mL

## 2021-09-15 MED ORDER — ONDANSETRON HCL 4 MG/2ML IJ SOLN
INTRAMUSCULAR | Status: AC
  Filled 2021-09-15: qty 2

## 2021-09-15 MED ORDER — CEFAZOLIN SODIUM-DEXTROSE 2-4 GM/100ML-% IV SOLN
INTRAVENOUS | Status: AC
  Filled 2021-09-15: qty 100

## 2021-09-15 MED ORDER — FENTANYL CITRATE (PF) 250 MCG/5ML IJ SOLN
INTRAMUSCULAR | Status: DC | PRN
  Administered 2021-09-15: 50 ug via INTRAVENOUS
  Administered 2021-09-15: 25 ug via INTRAVENOUS
  Administered 2021-09-15: 50 ug via INTRAVENOUS

## 2021-09-15 MED ORDER — LIDOCAINE 2% (20 MG/ML) 5 ML SYRINGE
INTRAMUSCULAR | Status: AC
  Filled 2021-09-15: qty 5

## 2021-09-15 MED ORDER — ROCURONIUM BROMIDE 10 MG/ML (PF) SYRINGE
PREFILLED_SYRINGE | INTRAVENOUS | Status: DC | PRN
  Administered 2021-09-15: 70 mg via INTRAVENOUS
  Administered 2021-09-15: 20 mg via INTRAVENOUS

## 2021-09-15 MED ORDER — DEXAMETHASONE SODIUM PHOSPHATE 10 MG/ML IJ SOLN
INTRAMUSCULAR | Status: AC
  Filled 2021-09-15: qty 1

## 2021-09-15 MED ORDER — HEMOSTATIC AGENTS (NO CHARGE) OPTIME
TOPICAL | Status: DC | PRN
  Administered 2021-09-15: 1 via TOPICAL

## 2021-09-15 MED ORDER — ORAL CARE MOUTH RINSE: 15.0000 mL | Freq: Once | OROMUCOSAL | Status: AC

## 2021-09-15 MED ORDER — CHLORHEXIDINE GLUCONATE 0.12 % MT SOLN
OROMUCOSAL | Status: AC
  Administered 2021-09-15: 15 mL via OROMUCOSAL
  Filled 2021-09-15: qty 15

## 2021-09-15 MED ORDER — ACETAMINOPHEN 500 MG PO TABS
ORAL_TABLET | ORAL | Status: AC
  Administered 2021-09-15: 1000 mg via ORAL
  Filled 2021-09-15: qty 2

## 2021-09-15 SURGICAL SUPPLY — 68 items
APL SKNCLS STERI-STRIP NONHPOA (GAUZE/BANDAGES/DRESSINGS) ×1
BAG COUNTER SPONGE SURGICOUNT (BAG) ×2 IMPLANT
BAG SPNG CNTER NS LX DISP (BAG) ×1
BAND INSRT 18 STRL LF DISP RB (MISCELLANEOUS) ×2
BAND RUBBER #18 3X1/16 STRL (MISCELLANEOUS) ×4 IMPLANT
BASKET BONE COLLECTION (BASKET) ×1 IMPLANT
BENZOIN TINCTURE PRP APPL 2/3 (GAUZE/BANDAGES/DRESSINGS) ×2 IMPLANT
BIT DRILL NEURO 2X3.1 SFT TUCH (MISCELLANEOUS) ×1 IMPLANT
BLADE CLIPPER SURG (BLADE) IMPLANT
BLADE SURG 15 STRL LF DISP TIS (BLADE) IMPLANT
BLADE SURG 15 STRL SS (BLADE)
BLADE ULTRA TIP 2M (BLADE) IMPLANT
BUR MATCHSTICK NEURO 3.0 LAGG (BURR) ×2 IMPLANT
CANISTER SUCT 3000ML PPV (MISCELLANEOUS) ×2 IMPLANT
DECANTER SPIKE VIAL GLASS SM (MISCELLANEOUS) ×1 IMPLANT
DEVICE ENDSKLTN TC NANOLCK 6MM (Cage) IMPLANT
DRAPE C-ARM 42X72 X-RAY (DRAPES) ×4 IMPLANT
DRAPE HALF SHEET 40X57 (DRAPES) IMPLANT
DRAPE LAPAROTOMY 100X72 PEDS (DRAPES) ×2 IMPLANT
DRAPE MICROSCOPE LEICA (MISCELLANEOUS) ×2 IMPLANT
DRESSING MEPILEX FLEX 4X4 (GAUZE/BANDAGES/DRESSINGS) ×1 IMPLANT
DRILL NEURO 2X3.1 SOFT TOUCH (MISCELLANEOUS) ×2
DRSG MEPILEX FLEX 4X4 (GAUZE/BANDAGES/DRESSINGS) ×2
DRSG OPSITE 4X5.5 SM (GAUZE/BANDAGES/DRESSINGS) ×4 IMPLANT
DRSG OPSITE POSTOP 4X6 (GAUZE/BANDAGES/DRESSINGS) ×1 IMPLANT
DURAPREP 26ML APPLICATOR (WOUND CARE) ×2 IMPLANT
ELECT COATED BLADE 2.86 ST (ELECTRODE) ×2 IMPLANT
ELECT REM PT RETURN 9FT ADLT (ELECTROSURGICAL) ×2
ELECTRODE REM PT RTRN 9FT ADLT (ELECTROSURGICAL) ×1 IMPLANT
ENDOSKELETON TC NANOLOCK 6MM (Cage) ×4 IMPLANT
GAUZE 4X4 16PLY ~~LOC~~+RFID DBL (SPONGE) ×1 IMPLANT
GLOVE SURG ENC MOIS LTX SZ8 (GLOVE) ×2 IMPLANT
GLOVE SURG LTX SZ7.5 (GLOVE) ×2 IMPLANT
GLOVE SURG LTX SZ9 (GLOVE) ×1 IMPLANT
GLOVE SURG UNDER POLY LF SZ7.5 (GLOVE) ×2 IMPLANT
GLOVE SURG UNDER POLY LF SZ8.5 (GLOVE) ×2 IMPLANT
GOWN STRL REUS W/ TWL LRG LVL3 (GOWN DISPOSABLE) ×2 IMPLANT
GOWN STRL REUS W/ TWL XL LVL3 (GOWN DISPOSABLE) ×1 IMPLANT
GOWN STRL REUS W/TWL 2XL LVL3 (GOWN DISPOSABLE) ×1 IMPLANT
GOWN STRL REUS W/TWL LRG LVL3 (GOWN DISPOSABLE) ×4
GOWN STRL REUS W/TWL XL LVL3 (GOWN DISPOSABLE) ×8
HEMOSTAT POWDER KIT SURGIFOAM (HEMOSTASIS) ×2 IMPLANT
KIT BASIN OR (CUSTOM PROCEDURE TRAY) ×2 IMPLANT
KIT TURNOVER KIT B (KITS) ×2 IMPLANT
NDL SPNL 22GX3.5 QUINCKE BK (NEEDLE) ×1 IMPLANT
NEEDLE HYPO 22GX1.5 SAFETY (NEEDLE) ×2 IMPLANT
NEEDLE SPNL 22GX3.5 QUINCKE BK (NEEDLE) ×2 IMPLANT
NS IRRIG 1000ML POUR BTL (IV SOLUTION) ×2 IMPLANT
PACK LAMINECTOMY NEURO (CUSTOM PROCEDURE TRAY) ×2 IMPLANT
PAD ARMBOARD 7.5X6 YLW CONV (MISCELLANEOUS) ×6 IMPLANT
PIN DISTRACTION 14MM (PIN) ×2 IMPLANT
PLATE ZEVO 2LVL 35MM (Plate) ×1 IMPLANT
PUTTY DBF 1CC CORTICAL FIBERS (Putty) ×1 IMPLANT
SCREW 3.5 SELFDRILL 15MM VARI (Screw) ×6 IMPLANT
SPONGE INTESTINAL PEANUT (DISPOSABLE) ×2 IMPLANT
SPONGE SURGIFOAM ABS GEL SZ50 (HEMOSTASIS) ×2 IMPLANT
STAPLER VISISTAT 35W (STAPLE) IMPLANT
STRIP CLOSURE SKIN 1/2X4 (GAUZE/BANDAGES/DRESSINGS) ×2 IMPLANT
SUT MNCRL AB 4-0 PS2 18 (SUTURE) ×2 IMPLANT
SUT SILK 2 0 TIES 10X30 (SUTURE) IMPLANT
SUT VIC AB 0 CT1 27 (SUTURE) ×2
SUT VIC AB 0 CT1 27XBRD ANTBC (SUTURE) IMPLANT
SUT VIC AB 2-0 CP2 18 (SUTURE) ×1 IMPLANT
SUT VIC AB 3-0 SH 8-18 (SUTURE) ×2 IMPLANT
TAPE CLOTH 3X10 TAN LF (GAUZE/BANDAGES/DRESSINGS) ×2 IMPLANT
TOWEL GREEN STERILE (TOWEL DISPOSABLE) ×2 IMPLANT
TOWEL GREEN STERILE FF (TOWEL DISPOSABLE) ×2 IMPLANT
WATER STERILE IRR 1000ML POUR (IV SOLUTION) ×2 IMPLANT

## 2021-09-15 NOTE — Discharge Instructions (Addendum)
Can remove transparent dressing and shower 48 hours after surgery.  Keep steri-strips in place. ?Wear soft cervical collar for comfort when out of bed ?Walk as much as possible ?No heavy lifting >10 lbs ?No excessive bending/twisting of the neck ?Chew small bites of easy to swallow foods ?Resume aspirin on 3/23 ?

## 2021-09-15 NOTE — Op Note (Signed)
PREOP DIAGNOSIS: Cervical stenosis with myelopathy ? ?POSTOP DIAGNOSIS: Cervical stenosis with myelopathy ? ?PROCEDURE: ?1. Arthrodesis C3-4, anterior interbody technique, including Discectomy for decompression of spinal cord and exiting nerve roots with foraminotomies  ?2. Arthrodesis, additional level C4-5 anterior interbody technique, including Discectomy for decompression of spinal cord and exiting nerve roots with foraminotomies  ?3. Placement of intervertebral biomechanical device C3-4 ?4. Placement of intervertebral biomechanical device C3-4 ?5. Placement of anterior instrumentation consisting of interbody plate and screws P7-1-0 ?6. Use of morselized bone allograft  ?7. Use of intraoperative microscope ? ?SURGEON: Dr. Duffy Rhody, MD ? ?ASSISTANT: Dr. Granville Lewis, MD.  Please note, no qualified trainees were available to assist with the procedure.  Assistance was required for retraction of the visceral structures to safely allow for instrumentation. ? ?ANESTHESIA: General Endotracheal ? ?EBL: 25 milliliters ? ?IMPLANTS: ?[employer] ?6 mm Titan C cage medium footprint at C3-4 and C4-5 ?Zevo plate ?15 mm screws x 6 ? ?SPECIMENS: None ? ?DRAINS: None ? ?COMPLICATIONS: None immediate ? ?CONDITION: Hemodynamically stable to PACU ? ?HISTORY: ?This is a 81 year old woman who complained of sharp pain in her right shoulder and upper arm.  She also had some mild bilateral hand weakness and hyperreflexia noted.  MRI showed severe cervical stenosis with cord signal change at C3-4, and to a lesser extent at C4-5.  Risks, benefits, alternatives, and expected convalescence were discussed with the patient.  Risks discussed included but were not limited to bleeding, pain, infection, dysphagia, dysphonia, pseudoarthrosis, hardware failure, adjacent segment disease, CSF leak, neurologic deficits, weakness, numbness, paralysis, coma, and death. After all questions were answered, informed consent was obtained. ? ?PROCEDURE IN  DETAIL: ?The patient was brought to the operating room and transferred to the operative table. After induction of general anesthesia, the patient was positioned on the operative table in the supine position with all pressure points meticulously padded. The skin of the neck was then prepped and draped in the usual sterile fashion. ? ?After timeout was conducted, the skin was infiltrated with local anesthetic. Skin incision was then made sharply and Bovie electrocautery was used to dissect the subcutaneous tissue until the platysma was identified. The platysma was then divided and undermined. The sternocleidomastoid muscle was then identified and, utilizing natural fascial planes in the neck, the prevertebral fascia was identified and the carotid sheath was retracted laterally and the trachea and esophagus retracted medially. Again using fluoroscopy, the C3-4 disc space was identified. Bovie electrocautery was used to dissect in the subperiosteal plane and elevate the bilateral longus coli muscles. Self-retaining retractors were then placed. Caspar distraction pins were placed in the adjacent bodies to allow for gentle distraction.  At this point, the microscope was draped and brought into the field, and the remainder of the case was done under the microscope using microdissecting technique. ? ?The disc space was incised sharply and combination of high speed drill, curettes, and rongeurs were use to initially complete a discectomy. The high-speed drill was then used to complete discectomy until the posterior annulus was identified and removed and the posterior longitudinal ligament was identified. Using a nerve hook, the PLL was elevated, and Kerrison rongeurs were used to remove the posterior longitudinal ligament and the ventral thecal sac was identified. Using a combination of curettes and rongeurs, complete decompression of the thecal sac and exiting nerve roots at this level was completed, and verified with easy  passage of micro-nerve hook centrally and in the bilateral foramina. ? ?Having completed our decompression, attention was  turned to placement of the intervertebral device. Trial spacers were used to select a size 6 mm graft. This graft was then filled with morcellized allograft, and inserted under live fluoroscopy. ? ?Attention was then turned to the C4-5 level. Caspar distraction pin was placed in the adjacent body to allow for gentle distraction of the disc space.  In a similar fashion, discectomy was completed initially with curettes and rongeurs, and completed with the drill. The PLL was again identified, elevated and incised. Using Kerrison rongeurs, decompression of the spinal cord and exiting roots was completed and confirmed with a dissector. Trial spacers were used to select a 6 mm graft. This graft was then filled with morcellized allograft, and inserted under live fluoroscopy. ? ?After placement of the intervertebral devices, the caspar pins were removed.  An anterior cervical plate was placed across the interspaces for anterior fixation.  Using a high-speed drill, the cortex of the cervical vertebral bodies was punctured, and screws inserted in the vertebral bodies. Final fluoroscopic images in AP and lateral projections were taken to confirm good hardware placement. ? ?At this point, after all counts were verified to be correct, meticulous hemostasis was secured using a combination of bipolar electrocautery and passive hemostatics. The platysma muscle was then closed using interrupted 3-0 Vicryl sutures, and the skin was closed with a 4-0 monocryl in subcuticular fashion. Sterile dressings were then applied and the drapes removed. ? ?The patient tolerated the procedure well and was extubated in the room and taken to the postanesthesia care unit in stable condition.  All counts were correct at the end of the procedure. ? ?

## 2021-09-15 NOTE — Anesthesia Postprocedure Evaluation (Signed)
Anesthesia Post Note ? ?Patient: Renee Collins ? ?Procedure(s) Performed: Anterior Cervical Decompression Discectomy Fusion Cervical three-four, Cervical four-five (Neck) ? ?  ? ?Patient location during evaluation: PACU ?Anesthesia Type: General ?Level of consciousness: awake and alert ?Pain management: pain level controlled ?Vital Signs Assessment: post-procedure vital signs reviewed and stable ?Respiratory status: spontaneous breathing, nonlabored ventilation and respiratory function stable ?Cardiovascular status: blood pressure returned to baseline and stable ?Postop Assessment: no apparent nausea or vomiting ?Anesthetic complications: no ? ? ?No notable events documented. ? ?Last Vitals:  ?Vitals:  ? 09/15/21 1115 09/15/21 1130  ?BP: 133/89 125/69  ?Pulse:  87  ?Resp:  16  ?Temp:    ?SpO2: 95% 95%  ?  ?Last Pain:  ?Vitals:  ? 09/15/21 1100  ?TempSrc:   ?PainSc: 0-No pain  ? ? ?  ?  ?  ?  ?  ?  ? ?Takisha Pelle,W. EDMOND ? ? ? ? ?

## 2021-09-15 NOTE — Anesthesia Procedure Notes (Signed)
Procedure Name: Intubation ?Date/Time: 09/15/2021 8:05 AM ?Performed by: Vonna Drafts, CRNA ?Pre-anesthesia Checklist: Patient identified, Emergency Drugs available, Suction available and Patient being monitored ?Patient Re-evaluated:Patient Re-evaluated prior to induction ?Oxygen Delivery Method: Circle system utilized ?Preoxygenation: Pre-oxygenation with 100% oxygen ?Induction Type: IV induction ?Ventilation: Mask ventilation without difficulty and Oral airway inserted - appropriate to patient size ?Laryngoscope Size: Glidescope and 3 ?Grade View: Grade I ?Tube type: Oral ?Tube size: 7.0 mm ?Number of attempts: 1 ?Airway Equipment and Method: Stylet and Oral airway ?Placement Confirmation: ETT inserted through vocal cords under direct vision, positive ETCO2 and breath sounds checked- equal and bilateral ?Secured at: 23 cm ?Tube secured with: Tape ?Dental Injury: Teeth and Oropharynx as per pre-operative assessment  ? ? ? ? ?

## 2021-09-15 NOTE — Transfer of Care (Signed)
Immediate Anesthesia Transfer of Care Note ? ?Patient: Renee Collins ? ?Procedure(s) Performed: Anterior Cervical Decompression Discectomy Fusion Cervical three-four, Cervical four-five (Neck) ? ?Patient Location: PACU ? ?Anesthesia Type:General ? ?Level of Consciousness: awake ? ?Airway & Oxygen Therapy: Patient Spontanous Breathing and Patient connected to nasal cannula oxygen ? ?Post-op Assessment: Report given to RN and Post -op Vital signs reviewed and stable ? ?Post vital signs: Reviewed and stable ? ?Last Vitals:  ?Vitals Value Taken Time  ?BP    ?Temp    ?Pulse 91 09/15/21 1031  ?Resp    ?SpO2 92 % 09/15/21 1031  ?Vitals shown include unvalidated device data. ? ?Last Pain:  ?Vitals:  ? 09/15/21 0636  ?TempSrc:   ?PainSc: 6   ?   ? ?Patients Stated Pain Goal: 3 (09/15/21 0636) ? ?Complications: No notable events documented. ?

## 2021-09-15 NOTE — H&P (Signed)
CC: cervical myelopathy ? ?HPI: ? ?  ? ?Patient is a 81 y.o. female presents with cervical stenosis with myelopathy, with progressive symptoms.  She is here for elective ACDF. ? ? ? ?Patient Active Problem List  ? Diagnosis Date Noted  ? Pre-op evaluation 07/24/2021  ? Aortic stenosis, mild 07/24/2021  ? Abnormal EKG 07/24/2021  ? Arm pain, lateral, right 04/22/2021  ? Cervical spine arthritis with nerve pain 04/22/2021  ? Murmur, cardiac 07/08/2020  ? 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 Early satiety 11/27/2019  ? Sciatica of right side 03/28/2018  ? Obesity, unspecified 11/25/2017  ? Monoclonal gammopathy 08/13/2013  ? 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 Stage 3 chronic kidney disease ([place]) 04/09/2013  ? Cataracts, bilateral 04/09/2013  ? Seasonal allergies 02/16/2011  ? Type 2 diabetes mellitus ([place]) 01/28/2011  ? THYROID NODULE 01/05/2010  ? KNEE, ARTHRITIS, DEGEN./OSTEO 05/01/2009  ? Dyslipidemia 11/11/2008  ? Anemia in chronic kidney disease 11/11/2008  ? Essential hypertension 06/14/2008  ? ?Past Medical History:  ?Diagnosis Date  ? Anemia   ? Chronic back pain   ? Chronic kidney disease   ? Depression   ? GERD (gastroesophageal reflux disease)   ? Heart murmur   ? Hyperlipidemia   ? Hypertension   ? Hypothyroidism   ? Pre-diabetes   ?  ?Past Surgical History:  ?Procedure Laterality Date  ? BONE MARROW ASPIRATION Left 05/16/15  ? BONE MARROW BIOPSY Left 05/16/15  ? COLONOSCOPY  03/30/2012  ? Procedure: COLONOSCOPY;  Surgeon: Rogene Houston, MD;  [facility]: [facility];  Service: Endoscopy;  Laterality: N/A;  730  ? COLONOSCOPY N/A 06/30/2017  ? Procedure: COLONOSCOPY;  Surgeon: Rogene Houston, MD;  Location: [facility];  Service: Endoscopy;  Laterality: N/A;  2:00  ? LAMINECTOMY  1980's  ? POLYPECTOMY  06/30/2017  ? Procedure: POLYPECTOMY;  Surgeon: Rogene Houston, MD;  Location: [facility];  Service: Endoscopy;;  colon  ? SPINE  SURGERY  approx 1983  ? dr Joya Salm  ? TOTAL ABDOMINAL HYSTERECTOMY  approx 1993  ? fibroids   ?  ?Medications Prior to Admission  ?Medication Sig Dispense Refill Last Dose  ? acetaminophen (TYLENOL) 650 MG CR tablet Take 650 mg by mouth every 8 (eight) hours as needed for pain.    09/14/2021  ? amLODipine (NORVASC) 10 MG tablet TAKE 1 TABLET BY MOUTH  DAILY 90 tablet 3 09/15/2021 at 0400  ? benazepril (LOTENSIN) 5 MG tablet Take 5 mg by mouth daily.   09/14/2021  ? buPROPion (WELLBUTRIN XL) 150 MG 24 hr tablet TAKE 1 TABLET BY MOUTH  DAILY 90 tablet 3 09/15/2021 at 0400  ? calcitRIOL (ROCALTROL) 0.25 MCG capsule Take 0.25 mcg by mouth every Monday, Wednesday, and Friday.   09/14/2021  ? Calcium-Magnesium-Vitamin D (CALCIUM 1200+D3 PO) Take 1 tablet by mouth 2 (two) times daily.   09/14/2021  ? diphenhydrAMINE (BENADRYL) 25 mg capsule Take 25 mg by mouth at bedtime as needed for allergies or sleep.   Past Week  ? gabapentin (NEURONTIN) 100 MG capsule TAKE 1 TO 2 CAPSULES BY  MOUTH 3 TIMES DAILY (Patient taking differently: Take 200 mg by mouth 3 (three) times daily.) 540 capsule 3 09/15/2021 at 0400  ? hydrochlorothiazide (HYDRODIURIL) 25 MG tablet TAKE 1 TABLET BY MOUTH  DAILY 90 tablet 3 09/14/2021  ? levothyroxine (SYNTHROID) 88 MCG tablet TAKE 1 TABLET BY MOUTH ONCE DAILY 1/2 HOUR  BEFORE FIRST MEAL OF THE DAY. DRINK  WATER 60 tablet 5 09/14/2021  ? Misc Natural Products (JOINT HEALTH PO) Take 1 capsule by mouth daily.   Past Week  ? Multiple Vitamin (MULTIVITAMIN WITH MINERALS) TABS tablet Take 1 tablet by mouth daily.   Past Week  ? omeprazole (PRILOSEC) 20 MG capsule Take 20 mg by mouth daily as needed (for acid reflux.).    09/14/2021  ? pravastatin (PRAVACHOL) 20 MG tablet TAKE 1 TABLET BY MOUTH IN  THE EVENING 90 tablet 3 Past Week  ? aspirin 81 MG EC tablet Take 1 tablet (81 mg total) by mouth daily. (Patient not taking: Reported on 09/10/2021) 30 tablet 12 09/08/2021  ? ?No Known Allergies  ?Social History  ? ?Tobacco  Use  ? Smoking status: Never  ? Smokeless tobacco: Never  ?Substance Use Topics  ? Alcohol use: No  ?  ?Family History  ?Problem Relation Age of Onset  ? Heart attack Mother 65  ? Heart disease Mother   ? Cancer Mother   ?     type unknown  ? Diabetes Mother   ? Kidney failure Father   ? Kidney disease Father   ? Myasthenia gravis Brother   ? Diabetes Sister   ? Breast cancer Sister 56  ? Heart disease Brother   ? Diabetes Sister   ? Sickle cell anemia Sister   ? Cancer Sister   ?     unknown type  ?  ? ?Review of Systems ?A comprehensive review of systems was negative. ? ?Objective:  ? ?Patient Vitals for the past 8 hrs: ? BP Temp Temp src Pulse Resp SpO2 Height Weight  ?09/15/21 0600 (!) 151/78 98 ?F (36.7 ?C) Oral 91 18 99 % $Re'5\' 5"'xsw$  (1.651 m) 89.4 kg  ? ?No intake/output data recorded. ?No intake/output data recorded. ? ? ? ? ? General : Alert, cooperative, no distress, appears stated age  ? Head:  Normocephalic/atraumatic   ? Eyes: PERRL, conjunctiva/corneas clear, EOM's intact. Fundi could not be visualized ?Neck: Supple ?Chest:  Respirations unlabored ?Chest wall: no tenderness or deformity ?Heart: Regular rate and rhythm ?Abdomen: Soft, nontender and nondistended ?Extremities: warm and well-perfused ?Skin: normal turgor, color and texture ?Neurologic:  Alert, oriented x 3.  Eyes open spontaneously. PERRL, EOMI, VFC, no facial droop. V1-3 intact.  No dysarthria, tongue protrusion symmetric.  CNII-XII intact. Normal strength, sensation and reflexes throughout.  No pronator drift, full strength in legs  ?  ? ? ? ?Data ReviewCBC:  ?Lab Results  ?Component Value Date  ? WBC 5.0 09/11/2021  ? RBC 3.96 09/11/2021  ? ?BMP:  ?Lab Results  ?Component Value Date  ? GLUCOSE 114 (H) 09/11/2021  ? CO2 22 09/11/2021  ? BUN 20 09/11/2021  ? BUN 29 (H) 07/24/2021  ? CREATININE 1.64 (H) 09/11/2021  ? CREATININE 1.58 (H) 11/30/2019  ? CALCIUM 9.3 09/11/2021  ? ?Radiology review:  ?Severe cervical stenosis with myelomalacia at  C3-4 and to a lesser extent C4-5 ? ?Assessment:  ? ?Active Problems: ?  * No active hospital problems. * ?Cervical stenosis with myelopathy ? ?Plan:  ? ?- C3-4, C4-5 ACDF today ? ? ?

## 2021-09-16 ENCOUNTER — Encounter (HOSPITAL_COMMUNITY): Payer: Self-pay | Admitting: Neurosurgery

## 2021-09-30 ENCOUNTER — Inpatient Hospital Stay (HOSPITAL_COMMUNITY): Payer: Medicare Other | Attending: Hematology

## 2021-09-30 DIAGNOSIS — D472 Monoclonal gammopathy: Secondary | ICD-10-CM | POA: Insufficient documentation

## 2021-09-30 DIAGNOSIS — Z803 Family history of malignant neoplasm of breast: Secondary | ICD-10-CM | POA: Insufficient documentation

## 2021-09-30 DIAGNOSIS — D649 Anemia, unspecified: Secondary | ICD-10-CM | POA: Diagnosis not present

## 2021-09-30 DIAGNOSIS — Z90710 Acquired absence of both cervix and uterus: Secondary | ICD-10-CM | POA: Diagnosis not present

## 2021-09-30 DIAGNOSIS — I129 Hypertensive chronic kidney disease with stage 1 through stage 4 chronic kidney disease, or unspecified chronic kidney disease: Secondary | ICD-10-CM | POA: Insufficient documentation

## 2021-09-30 DIAGNOSIS — D631 Anemia in chronic kidney disease: Secondary | ICD-10-CM

## 2021-09-30 DIAGNOSIS — N189 Chronic kidney disease, unspecified: Secondary | ICD-10-CM | POA: Insufficient documentation

## 2021-09-30 LAB — COMPREHENSIVE METABOLIC PANEL
ALT: 20 U/L (ref 0–44)
AST: 22 U/L (ref 15–41)
Albumin: 3.6 g/dL (ref 3.5–5.0)
Alkaline Phosphatase: 75 U/L (ref 38–126)
Anion gap: 8 (ref 5–15)
BUN: 24 mg/dL — ABNORMAL HIGH (ref 8–23)
CO2: 25 mmol/L (ref 22–32)
Calcium: 9.5 mg/dL (ref 8.9–10.3)
Chloride: 104 mmol/L (ref 98–111)
Creatinine, Ser: 2 mg/dL — ABNORMAL HIGH (ref 0.44–1.00)
GFR, Estimated: 24 mL/min — ABNORMAL LOW (ref >60)
Glucose, Bld: 133 mg/dL — ABNORMAL HIGH (ref 70–99)
Potassium: 3.4 mmol/L — ABNORMAL LOW (ref 3.5–5.1)
Sodium: 137 mmol/L (ref 135–145)
Total Bilirubin: 0.3 mg/dL (ref 0.3–1.2)
Total Protein: 8.9 g/dL — ABNORMAL HIGH (ref 6.5–8.1)

## 2021-09-30 LAB — IRON AND TIBC
Iron: 60 ug/dL (ref 28–170)
Saturation Ratios: 21 % (ref 10.4–31.8)
TIBC: 290 ug/dL (ref 250–450)
UIBC: 230 ug/dL

## 2021-09-30 LAB — CBC WITH DIFFERENTIAL/PLATELET
Abs Immature Granulocytes: 0.01 10*3/uL (ref 0.00–0.07)
Basophils Absolute: 0 10*3/uL (ref 0.0–0.1)
Basophils Relative: 1 %
Eosinophils Absolute: 0.2 10*3/uL (ref 0.0–0.5)
Eosinophils Relative: 3 %
HCT: 34.1 % — ABNORMAL LOW (ref 36.0–46.0)
Hemoglobin: 10.4 g/dL — ABNORMAL LOW (ref 12.0–15.0)
Immature Granulocytes: 0 %
Lymphocytes Relative: 26 %
Lymphs Abs: 1.7 10*3/uL (ref 0.7–4.0)
MCH: 26.6 pg (ref 26.0–34.0)
MCHC: 30.5 g/dL (ref 30.0–36.0)
MCV: 87.2 fL (ref 80.0–100.0)
Monocytes Absolute: 0.4 10*3/uL (ref 0.1–1.0)
Monocytes Relative: 6 %
Neutro Abs: 4 10*3/uL (ref 1.7–7.7)
Neutrophils Relative %: 64 %
Platelets: 249 10*3/uL (ref 150–400)
RBC: 3.91 MIL/uL (ref 3.87–5.11)
RDW: 12.6 % (ref 11.5–15.5)
WBC: 6.4 10*3/uL (ref 4.0–10.5)
nRBC: 0 % (ref 0.0–0.2)

## 2021-09-30 LAB — LACTATE DEHYDROGENASE: LDH: 126 U/L (ref 98–192)

## 2021-09-30 LAB — FERRITIN: Ferritin: 184 ng/mL (ref 11–307)

## 2021-10-01 LAB — KAPPA/LAMBDA LIGHT CHAINS
Kappa free light chain: 236.1 mg/L — ABNORMAL HIGH (ref 3.3–19.4)
Kappa, lambda light chain ratio: 14.22 — ABNORMAL HIGH (ref 0.26–1.65)
Lambda free light chains: 16.6 mg/L (ref 5.7–26.3)

## 2021-10-02 LAB — PROTEIN ELECTROPHORESIS, SERUM
A/G Ratio: 0.7 (ref 0.7–1.7)
Albumin ELP: 3.4 g/dL (ref 2.9–4.4)
Alpha-1-Globulin: 0.3 g/dL (ref 0.0–0.4)
Alpha-2-Globulin: 1.1 g/dL — ABNORMAL HIGH (ref 0.4–1.0)
Beta Globulin: 1.1 g/dL (ref 0.7–1.3)
Gamma Globulin: 2.2 g/dL — ABNORMAL HIGH (ref 0.4–1.8)
Globulin, Total: 4.7 g/dL — ABNORMAL HIGH (ref 2.2–3.9)
M-Spike, %: 1.9 g/dL — ABNORMAL HIGH
Total Protein ELP: 8.1 g/dL (ref 6.0–8.5)

## 2021-10-06 NOTE — Progress Notes (Signed)
[name] ?[address] ?[place], [place] [address] ? ? ?CLINIC:  ?Medical Oncology/Hematology ? ?PCP:  ?[name], MD ?[address], [address] ?[place] [address] ?[phone] ? ? ?REASON FOR VISIT:  ?Follow-up for MGUS and normocytic anemia ?  ?CURRENT THERAPY: Intermittent Feraheme (last given 02/15/2020) ?  ?INTERVAL HISTORY:  ?[name] 81 y.o. 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  She was last seen by Tarri Abernethy PA-C on 03/25/2021. ? ?She denies any bleeding episodes such as epistaxis, hematemesis, hematochezia, or melena. ?She has been a bit more tired than usual following her cervical neck surgery on 09/15/2021, reports that prior to that she was doing well. ?She denies any chest pain, dyspnea on exertion, or syncope. ?No new neurologic changes or vasomotor symptoms - she denies any neuropathy, vision changes, tinnitus, strokelike symptoms, dizziness, or headache. ?She denies any new bony pain. ?  ?She was previously having significant pain in her right upper arm, with associated paresthesias and neuropathy.  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  She underwent cervical spine surgery on 09/15/2021, and reports that her pain and neuropathy have been much better since that time. ? ?She has 50% energy and 70% appetite. She endorses that she is maintaining a stable weight. ? ? ?REVIEW OF SYSTEMS:  ?Review of Systems  ?Constitutional:  Positive for fatigue. Negative for appetite change, chills, diaphoresis, fever and unexpected weight change.  ?HENT:   Positive for trouble swallowing. Negative for lump/mass and nosebleeds.   ?Eyes:  Negative for eye problems.  ?Respiratory:  Negative for cough, hemoptysis and shortness of breath.   ?Cardiovascular:  Negative for chest pain, leg swelling and palpitations.  ?Gastrointestinal:  Positive for constipation. Negative for abdominal pain, blood in stool, diarrhea,  nausea and vomiting.  ?Genitourinary:  Negative for hematuria.   ?Skin: Negative.   ?Neurological:  Negative for dizziness, headaches and light-headedness.  ?Hematological:  Does not bruise/bleed easily.   ? ? ?PAST MEDICAL/SURGICAL HISTORY:  ?Past Medical History:  ?Diagnosis Date  ? Anemia   ? Chronic back pain   ? Chronic kidney disease   ? Depression   ? GERD (gastroesophageal reflux disease)   ? Heart murmur   ? Hyperlipidemia   ? Hypertension   ? Hypothyroidism   ? Pre-diabetes   ? ?Past Surgical History:  ?Procedure Laterality Date  ? ANTERIOR CERVICAL DECOMP/DISCECTOMY FUSION N/A 09/15/2021  ? Procedure: Anterior Cervical Decompression Discectomy Fusion Cervical three-four, Cervical four-five;  Surgeon: Vallarie Mare, MD;  Location: [facility];  Service: Neurosurgery;  Laterality: N/A;  ? BONE MARROW ASPIRATION Left 05/16/15  ? BONE MARROW BIOPSY Left 05/16/15  ? COLONOSCOPY  03/30/2012  ? Procedure: COLONOSCOPY;  Surgeon: Rogene Houston, MD;  [facility];  Service: Endoscopy;  Laterality: N/A;  730  ? COLONOSCOPY N/A 06/30/2017  ? Procedure: COLONOSCOPY;  Surgeon: Rogene Houston, MD;  [facility];  Service: Endoscopy;  Laterality: N/A;  2:00  ? LAMINECTOMY  1980's  ? POLYPECTOMY  06/30/2017  ? Procedure: POLYPECTOMY;  Surgeon: Rogene Houston, MD;  [facility];  Service: Endoscopy;;  colon  ? SPINE SURGERY  approx 1983  ? dr Joya Salm  ? TOTAL ABDOMINAL HYSTERECTOMY  approx 1993  ? fibroids   ? ? ? ?SOCIAL HISTORY:  ?Social History  ? ?Socioeconomic History  ? Marital status: Divorced  ?  Spouse name: Not on  file  ? Number of children: 3  ? Years of education: Not on file  ? Highest education level: Not on file  ?Occupational History  ? Occupation: LPN  ?Tobacco Use  ? Smoking status: Never  ? Smokeless tobacco: Never  ?Vaping Use  ? Vaping Use: Never used  ?Substance and Sexual Activity  ? Alcohol use: No  ? Drug use: No  ? Sexual activity: Not Currently  ?Other Topics  Concern  ? Not on file  ?Social History Narrative  ? Pt has 2 living adult children, 1 desease at age 45 secondary congential heart disease.  ? 7 grandchildren.  ? ?Social Determinants of Health  ? ?Financial Resource Strain: Low Risk   ? Difficulty of Paying Living Expenses: Not hard at all  ?Food Insecurity: No Food Insecurity  ? Worried About Charity fundraiser in the Last Year: Never true  ? Ran Out of Food in the Last Year: Never true  ?Transportation Needs: Unmet Transportation Needs  ? Lack of Transportation (Medical): Yes  ? Lack of Transportation (Non-Medical): Yes  ?Physical Activity: Insufficiently Active  ? Days of Exercise per Week: 3 days  ? Minutes of Exercise per Session: 30 min  ?Stress: No Stress Concern Present  ? Feeling of Stress : Not at all  ?Social Connections: Moderately Integrated  ? Frequency of Communication with Friends and Family: More than three times a week  ? Frequency of Social Gatherings with Friends and Family: More than three times a week  ? Attends Religious Services: 1 to 4 times per year  ? Active Member of Clubs or Organizations: Yes  ? Attends Archivist Meetings: 1 to 4 times per year  ? Marital Status: Divorced  ?Intimate Partner Violence: Not At Risk  ? Fear of Current or Ex-Partner: No  ? Emotionally Abused: No  ? Physically Abused: No  ? Sexually Abused: No  ? ? ?FAMILY HISTORY:  ?Family History  ?Problem Relation Age of Onset  ? Heart attack Mother 29  ? Heart disease Mother   ? Cancer Mother   ?     type unknown  ? Diabetes Mother   ? Kidney failure Father   ? Kidney disease Father   ? Myasthenia gravis Brother   ? Diabetes Sister   ? Breast cancer Sister 34  ? Heart disease Brother   ? Diabetes Sister   ? Sickle cell anemia Sister   ? Cancer Sister   ?     unknown type  ? ? ?CURRENT MEDICATIONS:  ?Outpatient Encounter Medications as of 10/07/2021  ?Medication Sig  ? acetaminophen (TYLENOL) 650 MG CR tablet Take 650 mg by mouth every 8 (eight) hours as  needed for pain.   ? amLODipine (NORVASC) 10 MG tablet TAKE 1 TABLET BY MOUTH  DAILY  ? aspirin 81 MG EC tablet Take 1 tablet (81 mg total) by mouth daily. (Patient not taking: Reported on 09/10/2021)  ? benazepril (LOTENSIN) 5 MG tablet Take 5 mg by mouth daily.  ? buPROPion (WELLBUTRIN XL) 150 MG 24 hr tablet TAKE 1 TABLET BY MOUTH  DAILY  ? calcitRIOL (ROCALTROL) 0.25 MCG capsule Take 0.25 mcg by mouth every Monday, Wednesday, and Friday.  ? Calcium-Magnesium-Vitamin D (CALCIUM 1200+D3 PO) Take 1 tablet by mouth 2 (two) times daily.  ? diphenhydrAMINE (BENADRYL) 25 mg capsule Take 25 mg by mouth at bedtime as needed for allergies or sleep.  ? docusate sodium (COLACE) 100 MG capsule Take 1 capsule (100  mg total) by mouth 2 (two) times daily.  ? gabapentin (NEURONTIN) 100 MG capsule TAKE 1 TO 2 CAPSULES BY  MOUTH 3 TIMES DAILY (Patient taking differently: Take 200 mg by mouth 3 (three) times daily.)  ? hydrochlorothiazide (HYDRODIURIL) 25 MG tablet TAKE 1 TABLET BY MOUTH  DAILY  ? levothyroxine (SYNTHROID) 88 MCG tablet TAKE 1 TABLET BY MOUTH ONCE DAILY 1/2 HOUR BEFORE FIRST MEAL OF THE DAY. DRINK  WATER  ? Misc Natural Products (JOINT HEALTH PO) Take 1 capsule by mouth daily.  ? Multiple Vitamin (MULTIVITAMIN WITH MINERALS) TABS tablet Take 1 tablet by mouth daily.  ? omeprazole (PRILOSEC) 20 MG capsule Take 20 mg by mouth daily as needed (for acid reflux.).   ? oxyCODONE-acetaminophen (PERCOCET) 5-325 MG tablet Take 1 tablet by mouth every 4 (four) hours as needed for severe pain.  ? pravastatin (PRAVACHOL) 20 MG tablet TAKE 1 TABLET BY MOUTH IN  THE EVENING  ? ?No facility-administered encounter medications on file as of 10/07/2021.  ? ? ?ALLERGIES:  ?No Known Allergies ? ? ?PHYSICAL EXAM:  ?ECOG PERFORMANCE STATUS: 1 - Symptomatic but completely ambulatory ? ?There were no vitals filed for this visit. ?There were no vitals filed for this visit. ?Physical Exam ?Constitutional:   ?   Appearance: Normal appearance.  She is obese.  ?HENT:  ?   Head: Normocephalic and atraumatic.  ?   Mouth/Throat:  ?   Mouth: Mucous membranes are moist.  ?Eyes:  ?   Extraocular Movements: Extraocular movements intact.  ?   Pupils: Pupils are equ

## 2021-10-07 ENCOUNTER — Inpatient Hospital Stay (HOSPITAL_COMMUNITY): Payer: Medicare Other | Admitting: Physician Assistant

## 2021-10-07 VITALS — BP 147/76 | HR 93 | Temp 97.6°F | Resp 18 | Ht 65.0 in | Wt 193.6 lb

## 2021-10-07 DIAGNOSIS — D472 Monoclonal gammopathy: Secondary | ICD-10-CM | POA: Diagnosis not present

## 2021-10-07 DIAGNOSIS — D631 Anemia in chronic kidney disease: Secondary | ICD-10-CM

## 2021-10-07 DIAGNOSIS — Z803 Family history of malignant neoplasm of breast: Secondary | ICD-10-CM | POA: Diagnosis not present

## 2021-10-07 DIAGNOSIS — D649 Anemia, unspecified: Secondary | ICD-10-CM | POA: Diagnosis not present

## 2021-10-07 DIAGNOSIS — N1832 Chronic kidney disease, stage 3b: Secondary | ICD-10-CM

## 2021-10-07 DIAGNOSIS — I129 Hypertensive chronic kidney disease with stage 1 through stage 4 chronic kidney disease, or unspecified chronic kidney disease: Secondary | ICD-10-CM | POA: Diagnosis not present

## 2021-10-07 DIAGNOSIS — D500 Iron deficiency anemia secondary to blood loss (chronic): Secondary | ICD-10-CM | POA: Diagnosis not present

## 2021-10-07 DIAGNOSIS — N189 Chronic kidney disease, unspecified: Secondary | ICD-10-CM | POA: Diagnosis not present

## 2021-10-07 NOTE — Patient Instructions (Signed)
[place] at [facility] ?Discharge Instructions ? ?You were seen today by Tarri Abernethy PA-C for your abnormal protein (MGUS) and your anemia related to chronic kidney disease. ? ?MGUS: As we discussed, this is an abnormal protein in your blood.  In some cases, it can progress to a type of cancer known as multiple myeloma.  You do not show any signs of progression at this time, but we will continue to monitor you with repeat labs and an office visit in 6 months.  We will also check XRAYS in 6 months to make sure you do not have any abnormal spots on your bones. ? ?ANEMIA: Your blood count is slightly lower than normal, which is related to your underlying chronic kidney disease.  At this time, you do not need any treatment for your anemia, but we will continue to monitor it.  If your blood count goes too low, we will consider starting you on injections called Retacrit, which helps your body to make more blood cells. ? ?LABS: Return in 6 months for repeat labs ? ?OTHER TESTS: Whole-body Xrays in 6 months ? ?MEDICATIONS: No changes to home medications ? ?FOLLOW-UP APPOINTMENT: Office visit in 6 months, after labs and Xrays ? ? ?Thank you for choosing [place] at [facility] to provide your oncology and hematology care.  To afford each patient quality time with our provider, please arrive at least 15 minutes before your scheduled appointment time.  ? ?If you have a lab appointment with the [facility] please come in thru the Main Entrance and check in at the main information desk. ? ?You need to re-schedule your appointment should you arrive 10 or more minutes late.  We strive to give you quality time with our providers, and arriving late affects you and other patients whose appointments are after yours.  Also, if you no show three or more times for appointments you may be dismissed from the clinic at the providers discretion.     ?Again, thank you for choosing  [facility].  Our hope is that these requests will decrease the amount of time that you wait before being seen by our physicians.       ?_____________________________________________________________ ? ?Should you have questions after your visit to [facility], please contact our office at [phone] and follow the prompts.  Our office hours are 8:00 a.m. and 4:30 p.m. Monday - Friday.  Please note that voicemails left after 4:00 p.m. may not be returned until the following business day.  We are closed weekends and major holidays.  You do have access to a nurse 24-7, just call the main number to the clinic [phone] and do not press any options, hold on the line and a nurse will answer the phone.   ? ?For prescription refill requests, have your pharmacy contact our office and allow 72 hours.   ? ?Due to Covid, you will need to wear a mask upon entering the hospital. If you do not have a mask, a mask will be given to you at the Main Entrance upon arrival. For doctor visits, patients may have 1 support person age 43 or older with them. For treatment visits, patients can not have anyone with them due to social distancing guidelines and our immunocompromised population.  ? ? ? ?

## 2021-10-15 ENCOUNTER — Telehealth: Payer: Self-pay | Admitting: Nurse Practitioner

## 2021-10-15 NOTE — Telephone Encounter (Signed)
Opened in error

## 2021-10-16 DIAGNOSIS — I129 Hypertensive chronic kidney disease with stage 1 through stage 4 chronic kidney disease, or unspecified chronic kidney disease: Secondary | ICD-10-CM | POA: Diagnosis not present

## 2021-10-16 DIAGNOSIS — N189 Chronic kidney disease, unspecified: Secondary | ICD-10-CM | POA: Diagnosis not present

## 2021-10-16 DIAGNOSIS — E1122 Type 2 diabetes mellitus with diabetic chronic kidney disease: Secondary | ICD-10-CM | POA: Diagnosis not present

## 2021-10-16 DIAGNOSIS — R809 Proteinuria, unspecified: Secondary | ICD-10-CM | POA: Diagnosis not present

## 2021-10-16 DIAGNOSIS — E1129 Type 2 diabetes mellitus with other diabetic kidney complication: Secondary | ICD-10-CM | POA: Diagnosis not present

## 2021-10-19 ENCOUNTER — Ambulatory Visit (INDEPENDENT_AMBULATORY_CARE_PROVIDER_SITE_OTHER): Payer: Medicare Other | Admitting: Podiatry

## 2021-10-19 DIAGNOSIS — E0822 Diabetes mellitus due to underlying condition with diabetic chronic kidney disease: Secondary | ICD-10-CM | POA: Diagnosis not present

## 2021-10-19 DIAGNOSIS — B351 Tinea unguium: Secondary | ICD-10-CM | POA: Diagnosis not present

## 2021-10-19 DIAGNOSIS — N1832 Chronic kidney disease, stage 3b: Secondary | ICD-10-CM

## 2021-10-19 DIAGNOSIS — M79675 Pain in left toe(s): Secondary | ICD-10-CM | POA: Diagnosis not present

## 2021-10-19 DIAGNOSIS — Z794 Long term (current) use of insulin: Secondary | ICD-10-CM

## 2021-10-19 DIAGNOSIS — L84 Corns and callosities: Secondary | ICD-10-CM | POA: Diagnosis not present

## 2021-10-19 DIAGNOSIS — M79674 Pain in right toe(s): Secondary | ICD-10-CM | POA: Diagnosis not present

## 2021-10-22 DIAGNOSIS — E1129 Type 2 diabetes mellitus with other diabetic kidney complication: Secondary | ICD-10-CM | POA: Diagnosis not present

## 2021-10-22 DIAGNOSIS — I129 Hypertensive chronic kidney disease with stage 1 through stage 4 chronic kidney disease, or unspecified chronic kidney disease: Secondary | ICD-10-CM | POA: Diagnosis not present

## 2021-10-22 DIAGNOSIS — R809 Proteinuria, unspecified: Secondary | ICD-10-CM | POA: Diagnosis not present

## 2021-10-22 DIAGNOSIS — E1122 Type 2 diabetes mellitus with diabetic chronic kidney disease: Secondary | ICD-10-CM | POA: Diagnosis not present

## 2021-10-22 DIAGNOSIS — D638 Anemia in other chronic diseases classified elsewhere: Secondary | ICD-10-CM | POA: Diagnosis not present

## 2021-10-22 DIAGNOSIS — E211 Secondary hyperparathyroidism, not elsewhere classified: Secondary | ICD-10-CM | POA: Diagnosis not present

## 2021-10-22 DIAGNOSIS — D472 Monoclonal gammopathy: Secondary | ICD-10-CM | POA: Diagnosis not present

## 2021-10-22 DIAGNOSIS — N189 Chronic kidney disease, unspecified: Secondary | ICD-10-CM | POA: Diagnosis not present

## 2021-10-26 NOTE — Progress Notes (Signed)
?  Subjective:  ?Patient ID: Renee Collins, female    DOB: [birth year],  MRN: [id number] ? ?Renee Collins presents to clinic today for at risk foot care. Pt has h/o NIDDM with chronic kidney disease and callus(es) left lower extremity and painful thick toenails that are difficult to trim. Painful toenails interfere with ambulation. Aggravating factors include wearing enclosed shoe gear. Pain is relieved with periodic professional debridement. Painful calluses are aggravated when weightbearing with and without shoegear. Pain is relieved with periodic professional debridement. ? ?Patient does not monitor blood glucose daily. ? ?New problem(s): None.  ? ?PCP is [name], MD , and last visit was July 24, 2021. ? ?No Known Allergies ? ?Review of Systems: Negative except as noted in the HPI. ? ?Objective: No changes noted in today's physical examination. ?Constitutional Patient is a pleasant 81 y.o. African American female obese in NAD. AAO x 3.  ?Vascular Capillary refill time to digits immediate b/l. Palpable pedal pulses b/l LE. Pedal hair present. Lower extremity skin temperature gradient within normal limits. No edema noted b/l lower extremities. No cyanosis or clubbing noted.  ?Neurologic Normal speech. Protective sensation intact 5/5 intact bilaterally with 10g monofilament b/l. Vibratory sensation intact b/l. Proprioception intact bilaterally.  ?Dermatologic Pedal skin with normal turgor, texture and tone bilaterally. No open wounds bilaterally. No interdigital macerations bilaterally. Toenails 1-5 b/l elongated, discolored, dystrophic, thickened, crumbly with subungual debris and tenderness to dorsal palpation. Hyperkeratotic lesion(s) submet head 5 left foot.  No erythema, no edema, no drainage, no fluctuance.  ?Orthopedic: Normal muscle strength 5/5 to all lower extremity muscle groups bilaterally. No pain crepitus or joint limitation noted with ROM b/l. No gross bony deformities bilaterally.  ? ? ?   Latest Ref Rng & Units 07/10/2021  ? 10:36 AM 01/02/2021  ? 10:56 AM  ?Hemoglobin A1C  ?Hemoglobin-A1c 0.0 - 7.0 % 6.6   6.5    ? ?Assessment/Plan: ?1. Pain due to onychomycosis of toenails of both feet   ?2. Callus   ?3. 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  ? ?-Patient was evaluated and treated. All patient's and/or POA's questions/concerns answered on today's visit. ?-Patient to continue soft, supportive shoe gear daily. ?-Mycotic toenails 1-5 bilaterally were debrided in length and girth with sterile nail nippers and dremel without incident. ?-Callus(es) submet head 5 left foot pared utilizing sterile scalpel blade without complication or incident. Total number debrided =1. ?-Patient/POA to call should there be question/concern in the interim.  ? ?Return in about 3 months (around 01/18/2022). ? ?Marzetta Board, DPM  ?

## 2021-11-20 DIAGNOSIS — M4712 Other spondylosis with myelopathy, cervical region: Secondary | ICD-10-CM | POA: Diagnosis not present

## 2021-11-24 ENCOUNTER — Ambulatory Visit: Payer: Medicare Other | Admitting: Family Medicine

## 2021-11-25 ENCOUNTER — Encounter: Payer: Self-pay | Admitting: Family Medicine

## 2021-11-25 ENCOUNTER — Ambulatory Visit (INDEPENDENT_AMBULATORY_CARE_PROVIDER_SITE_OTHER): Payer: Medicare Other | Admitting: Family Medicine

## 2021-11-25 VITALS — BP 127/80 | HR 98 | Ht 66.0 in | Wt 194.1 lb

## 2021-11-25 DIAGNOSIS — Z6831 Body mass index (BMI) 31.0-31.9, adult: Secondary | ICD-10-CM

## 2021-11-25 DIAGNOSIS — I10 Essential (primary) hypertension: Secondary | ICD-10-CM

## 2021-11-25 DIAGNOSIS — E559 Vitamin D deficiency, unspecified: Secondary | ICD-10-CM | POA: Diagnosis not present

## 2021-11-25 DIAGNOSIS — E1149 Type 2 diabetes mellitus with other diabetic neurological complication: Secondary | ICD-10-CM

## 2021-11-25 DIAGNOSIS — E785 Hyperlipidemia, unspecified: Secondary | ICD-10-CM

## 2021-11-25 DIAGNOSIS — E6609 Other obesity due to excess calories: Secondary | ICD-10-CM

## 2021-11-25 DIAGNOSIS — F324 Major depressive disorder, single episode, in partial remission: Secondary | ICD-10-CM

## 2021-11-25 DIAGNOSIS — E039 Hypothyroidism, unspecified: Secondary | ICD-10-CM

## 2021-11-25 NOTE — Assessment & Plan Note (Signed)
Controlled, no change in medication  

## 2021-11-25 NOTE — Patient Instructions (Addendum)
F/u END AUGUST , FLU VACCINE AT VISIT, CALL IF YOU NEED ME BEFORE  Nurse pls check [facility] for TdAP, states she had it last year and if not se needs to go get it,  Please get your covid booster, this is due  Fasting lipid, cmp and EGFr, hBA1C and vitamin D 1 week before next appointment  It is important that you exercise regularly at least 30 minutes 5 times a week. If you develop chest pain, have severe difficulty breathing, or feel very tired, stop exercising immediately and seek medical attention  Think about what you will eat, plan ahead. Choose " clean, green, fresh or frozen" over canned, processed or packaged foods which are more sugary, salty and fatty. 70 to 75% of food eaten should be vegetables and fruit. Three meals at set times with snacks allowed between meals, but they must be fruit or vegetables. Aim to eat over a 12 hour period , example 7 am to 7 pm, and STOP after  your last meal of the day. Drink water,generally about 64 ounces per day, no other drink is as healthy. Fruit juice is best enjoyed in a healthy way, by EATING the fruit. Thanks for choosing [facility], we consider it a privelige to serve you.

## 2021-11-25 NOTE — Assessment & Plan Note (Signed)
Controlled, no change in medication DASH diet and commitment to daily physical activity for a minimum of 30 minutes discussed and encouraged, as a part of hypertension management. The importance of attaining a healthy weight is also discussed.     11/25/2021    3:12 PM 10/07/2021   11:18 AM 09/15/2021   11:30 AM 09/15/2021   11:15 AM 09/15/2021   11:00 AM 09/15/2021   10:45 AM 09/15/2021   10:30 AM  BP/Weight  Systolic BP 840 335 331 [phone]  Diastolic BP 80 76 69 89 66 65 70  Wt. (Lbs) 194.12 193.56       BMI 31.33 kg/m2 32.21 kg/m2

## 2021-11-25 NOTE — Progress Notes (Signed)
MALIKA DEMARIO     MRN: [id number]      DOB: 1941-05-02   HPI Ms. Crutchfield is here for follow up and re-evaluation of chronic medical conditions, medication management and review of any available recent lab and radiology data.  Preventive health is updated, specifically  Cancer screening and Immunization.   Questions or concerns regarding consultations or procedures which the PT has had in the interim are  addressed. The PT denies any adverse reactions to current medications since the last visit.  There are no new concerns.  There are no specific complaints   ROS Denies recent fever or chills. Denies sinus pressure, nasal congestion, ear pain or sore throat. Denies chest congestion, productive cough or wheezing. Denies chest pains, palpitations and leg swelling Denies abdominal pain, nausea, vomiting,diarrhea or constipation.   Denies dysuria, frequency, hesitancy or incontinence. Denies joint pain, swelling and limitation in mobility. Denies headaches, seizures, numbness, or tingling. Denies depression, anxiety or insomnia. Denies skin break down or rash.   PE  BP 127/80   Pulse 98   Ht '5\' 6"'$  (1.676 m)   Wt 194 lb 1.9 oz (88.1 kg)   SpO2 97%   BMI 31.33 kg/m   Patient alert and oriented and in no cardiopulmonary distress.  HEENT: No facial asymmetry, EOMI,     Neck supple .  Chest: Clear to auscultation bilaterally.  CVS: S1, S2 no murmurs, no S3.Regular rate.  ABD: Soft non tender.   Ext: No edema  MS: Adequate ROM spine, shoulders, hips and knees.  Skin: Intact, no ulcerations or rash noted.  Psych: Good eye contact, normal affect. Memory intact not anxious or depressed appearing.  CNS: CN 2-12 intact, power,  normal throughout.no focal deficits noted.   Assessment & Plan  Essential hypertension Controlled, no change in medication DASH diet and commitment to daily physical activity for a minimum of 30 minutes discussed and encouraged, as a part of  hypertension management. The importance of attaining a healthy weight is also discussed.     11/25/2021    3:12 PM 10/07/2021   11:18 AM 09/15/2021   11:30 AM 09/15/2021   11:15 AM 09/15/2021   11:00 AM 09/15/2021   10:45 AM 09/15/2021   10:30 AM  BP/Weight  Systolic BP 295 188 416 606 301 601 093  Diastolic BP 80 76 69 89 66 65 70  Wt. (Lbs) 194.12 193.56       BMI 31.33 kg/m2 32.21 kg/m2            Type 2 diabetes mellitus ([place]) Ms. Tukes is reminded of the importance of commitment to daily physical activity for 30 minutes or more, as able and the need to limit carbohydrate intake to 30 to 60 grams per meal to help with blood sugar control.   Ms. Bomba is reminded of the importance of daily foot exam, annual eye examination, and good blood sugar, blood pressure and cholesterol control.     Latest Ref Rng & Units 09/30/2021   10:53 AM 09/11/2021    9:11 AM 07/24/2021    2:02 PM 07/10/2021   10:36 AM 03/18/2021   12:08 PM  Diabetic Labs  HbA1c 0.0 - 7.0 %    6.6     Chol 100 - 199 mg/dL   179      HDL >39 mg/dL   67      Calc LDL 0 - 99 mg/dL   99      Triglycerides 0 -  149 mg/dL   70      Creatinine 0.44 - 1.00 mg/dL 2.00   1.64   1.69    1.78        11/25/2021    3:12 PM 10/07/2021   11:18 AM 09/15/2021   11:30 AM 09/15/2021   11:15 AM 09/15/2021   11:00 AM 09/15/2021   10:45 AM 09/15/2021   10:30 AM  BP/Weight  Systolic BP 944 967 591 [phone] 357  Diastolic BP 80 76 69 89 66 65 70  Wt. (Lbs) 194.12 193.56       BMI 31.33 kg/m2 32.21 kg/m2           01/16/2021   10:00 AM 11/27/2019   10:40 AM  Foot/eye exam completion dates  Foot Form Completion Done Done      Updated lab needed at/ before next visit.   Obesity, unspecified  Patient re-educated about  the importance of commitment to a  minimum of 150 minutes of exercise per week as able.  The importance of healthy food choices with portion control discussed, as well as eating regularly and within a 12 hour  window most days. The need to choose "clean , green" food 50 to 75% of the time is discussed, as well as to make water the primary drink and set a goal of 64 ounces water daily.       11/25/2021    3:12 PM 10/07/2021   11:18 AM 09/15/2021    6:00 AM  Weight /BMI  Weight 194 lb 1.9 oz 193 lb 9 oz 197 lb  Height '5\' 6"'$  (1.676 m) '5\' 5"'$  (1.651 m) '5\' 5"'$  (1.651 m)  BMI 31.33 kg/m2 32.21 kg/m2 32.78 kg/m2      Dyslipidemia Hyperlipidemia:Low fat diet discussed and encouraged.   Lipid Panel  Lab Results  Component Value Date   CHOL 179 07/24/2021   HDL 67 07/24/2021   LDLCALC 99 07/24/2021   TRIG 70 07/24/2021   CHOLHDL 2.7 07/24/2021   Updated lab needed at/ before next visit.     Depression, major, single episode, in partial remission (HCC) Controlled, no change in medication   Hypothyroid Controlled, no change in medication Updated lab needed at/ before next visit.

## 2021-11-25 NOTE — Assessment & Plan Note (Signed)
  Patient re-educated about  the importance of commitment to a  minimum of 150 minutes of exercise per week as able.  The importance of healthy food choices with portion control discussed, as well as eating regularly and within a 12 hour window most days. The need to choose "clean , green" food 50 to 75% of the time is discussed, as well as to make water the primary drink and set a goal of 64 ounces water daily.       11/25/2021    3:12 PM 10/07/2021   11:18 AM 09/15/2021    6:00 AM  Weight /BMI  Weight 194 lb 1.9 oz 193 lb 9 oz 197 lb  Height '5\' 6"'$  (1.676 m) '5\' 5"'$  (1.651 m) '5\' 5"'$  (1.651 m)  BMI 31.33 kg/m2 32.21 kg/m2 32.78 kg/m2

## 2021-11-25 NOTE — Assessment & Plan Note (Signed)
Controlled, no change in medication Updated lab needed at/ before next visit.  

## 2021-11-25 NOTE — Assessment & Plan Note (Signed)
Hyperlipidemia:Low fat diet discussed and encouraged.   Lipid Panel  Lab Results  Component Value Date   CHOL 179 07/24/2021   HDL 67 07/24/2021   LDLCALC 99 07/24/2021   TRIG 70 07/24/2021   CHOLHDL 2.7 07/24/2021   Updated lab needed at/ before next visit.

## 2021-11-25 NOTE — Assessment & Plan Note (Signed)
Renee Collins is reminded of the importance of commitment to daily physical activity for 30 minutes or more, as able and the need to limit carbohydrate intake to 30 to 60 grams per meal to help with blood sugar control.   Renee Collins is reminded of the importance of daily foot exam, annual eye examination, and good blood sugar, blood pressure and cholesterol control.     Latest Ref Rng & Units 09/30/2021   10:53 AM 09/11/2021    9:11 AM 07/24/2021    2:02 PM 07/10/2021   10:36 AM 03/18/2021   12:08 PM  Diabetic Labs  HbA1c 0.0 - 7.0 %    6.6     Chol 100 - 199 mg/dL   179      HDL >39 mg/dL   67      Calc LDL 0 - 99 mg/dL   99      Triglycerides 0 - 149 mg/dL   70      Creatinine 0.44 - 1.00 mg/dL 2.00   1.64   1.69    1.78        11/25/2021    3:12 PM 10/07/2021   11:18 AM 09/15/2021   11:30 AM 09/15/2021   11:15 AM 09/15/2021   11:00 AM 09/15/2021   10:45 AM 09/15/2021   10:30 AM  BP/Weight  Systolic BP 748 270 786 [phone] 071  Diastolic BP 80 76 69 89 66 65 70  Wt. (Lbs) 194.12 193.56       BMI 31.33 kg/m2 32.21 kg/m2           01/16/2021   10:00 AM 11/27/2019   10:40 AM  Foot/eye exam completion dates  Foot Form Completion Done Done      Updated lab needed at/ before next visit.

## 2021-11-26 ENCOUNTER — Encounter: Payer: Self-pay | Admitting: Family Medicine

## 2021-11-26 LAB — HM DIABETES EYE EXAM

## 2021-11-27 ENCOUNTER — Encounter: Payer: Self-pay | Admitting: Family Medicine

## 2021-12-25 DIAGNOSIS — E1129 Type 2 diabetes mellitus with other diabetic kidney complication: Secondary | ICD-10-CM | POA: Diagnosis not present

## 2021-12-25 DIAGNOSIS — E1122 Type 2 diabetes mellitus with diabetic chronic kidney disease: Secondary | ICD-10-CM | POA: Diagnosis not present

## 2021-12-25 DIAGNOSIS — R809 Proteinuria, unspecified: Secondary | ICD-10-CM | POA: Diagnosis not present

## 2021-12-25 DIAGNOSIS — N189 Chronic kidney disease, unspecified: Secondary | ICD-10-CM | POA: Diagnosis not present

## 2021-12-25 DIAGNOSIS — I129 Hypertensive chronic kidney disease with stage 1 through stage 4 chronic kidney disease, or unspecified chronic kidney disease: Secondary | ICD-10-CM | POA: Diagnosis not present

## 2021-12-28 DIAGNOSIS — M4712 Other spondylosis with myelopathy, cervical region: Secondary | ICD-10-CM | POA: Diagnosis not present

## 2021-12-31 DIAGNOSIS — E1122 Type 2 diabetes mellitus with diabetic chronic kidney disease: Secondary | ICD-10-CM | POA: Diagnosis not present

## 2021-12-31 DIAGNOSIS — E211 Secondary hyperparathyroidism, not elsewhere classified: Secondary | ICD-10-CM | POA: Diagnosis not present

## 2021-12-31 DIAGNOSIS — N189 Chronic kidney disease, unspecified: Secondary | ICD-10-CM | POA: Diagnosis not present

## 2021-12-31 DIAGNOSIS — D472 Monoclonal gammopathy: Secondary | ICD-10-CM | POA: Diagnosis not present

## 2021-12-31 DIAGNOSIS — R809 Proteinuria, unspecified: Secondary | ICD-10-CM | POA: Diagnosis not present

## 2021-12-31 DIAGNOSIS — E1129 Type 2 diabetes mellitus with other diabetic kidney complication: Secondary | ICD-10-CM | POA: Diagnosis not present

## 2021-12-31 DIAGNOSIS — I129 Hypertensive chronic kidney disease with stage 1 through stage 4 chronic kidney disease, or unspecified chronic kidney disease: Secondary | ICD-10-CM | POA: Diagnosis not present

## 2021-12-31 DIAGNOSIS — D638 Anemia in other chronic diseases classified elsewhere: Secondary | ICD-10-CM | POA: Diagnosis not present

## 2022-01-25 ENCOUNTER — Encounter: Payer: Self-pay | Admitting: Podiatry

## 2022-01-25 ENCOUNTER — Ambulatory Visit (INDEPENDENT_AMBULATORY_CARE_PROVIDER_SITE_OTHER): Payer: Medicare Other | Admitting: Podiatry

## 2022-01-25 DIAGNOSIS — Z794 Long term (current) use of insulin: Secondary | ICD-10-CM | POA: Diagnosis not present

## 2022-01-25 DIAGNOSIS — M79674 Pain in right toe(s): Secondary | ICD-10-CM | POA: Diagnosis not present

## 2022-01-25 DIAGNOSIS — N1832 Chronic kidney disease, stage 3b: Secondary | ICD-10-CM

## 2022-01-25 DIAGNOSIS — B351 Tinea unguium: Secondary | ICD-10-CM

## 2022-01-25 DIAGNOSIS — L84 Corns and callosities: Secondary | ICD-10-CM

## 2022-01-25 DIAGNOSIS — M79675 Pain in left toe(s): Secondary | ICD-10-CM | POA: Diagnosis not present

## 2022-01-25 DIAGNOSIS — E0822 Diabetes mellitus due to underlying condition with diabetic chronic kidney disease: Secondary | ICD-10-CM

## 2022-01-29 NOTE — Progress Notes (Signed)
  Subjective:  Patient ID: Renee Collins, female    DOB: [birth year],  MRN: [id number]  Renee Collins presents to clinic today for at risk foot care. Pt has h/o NIDDM with chronic kidney disease and callus(es) left lower extremity and painful thick toenails that are difficult to trim. Painful toenails interfere with ambulation. Aggravating factors include wearing enclosed shoe gear. Pain is relieved with periodic professional debridement. Painful calluses are aggravated when weightbearing with and without shoegear. Pain is relieved with periodic professional debridement.  New problem(s): None.   PCP is [name], MD , and last visit was  Nov 25, 2021  No Known Allergies  Review of Systems: Negative except as noted in the HPI.  Objective: No changes noted in today's physical examination. Constitutional Patient is a pleasant 81 y.o. African American female obese in NAD. AAO x 3.  Vascular Capillary refill time to digits immediate b/l. Palpable pedal pulses b/l LE. Pedal hair present. Lower extremity skin temperature gradient within normal limits. No edema noted b/l lower extremities. No cyanosis or clubbing noted.  Neurologic Normal speech. Protective sensation intact 5/5 intact bilaterally with 10g monofilament b/l. Vibratory sensation intact b/l. Proprioception intact bilaterally.  Dermatologic Pedal skin with normal turgor, texture and tone bilaterally. No open wounds bilaterally. No interdigital macerations bilaterally. Toenails 1-5 b/l elongated, discolored, dystrophic, thickened, crumbly with subungual debris and tenderness to dorsal palpation. Hyperkeratotic lesion(s) submet head 1 right foot and submet head 5 left foot.  No erythema, no edema, no drainage, no fluctuance. Porokeratotic lesion(s) lateral aspect left foot. No erythema, no edema, no drainage, no fluctuance.  Orthopedic: Normal muscle strength 5/5 to all lower extremity muscle groups bilaterally. No pain crepitus or joint  limitation noted with ROM b/l. No gross bony deformities bilaterally.      Latest Ref Rng & Units 07/10/2021   10:36 AM  Hemoglobin A1C  Hemoglobin-A1c 0.0 - 7.0 % 6.6    Assessment/Plan: 1. Pain due to onychomycosis of toenails of both feet   2. Callus   3. 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 -Continue foot and shoe inspections daily. Monitor blood glucose per PCP/Endocrinologist's recommendations. -Patient to continue soft, supportive shoe gear daily. -Toenails 1-5 b/l were debrided in length and girth with sterile nail nippers and dremel without iatrogenic bleeding.  -Callus(es) submet head 1 right foot and submet head 5 left foot pared utilizing sterile scalpel blade without complication or incident. Total number debrided =2. -Porokeratotic lesion(s) lateral aspect left foot pared and enucleated with sterile scalpel blade without incident. Total number of lesions debrided=1. -Patient/POA to call should there be question/concern in the interim.   Return in about 3 months (around 04/27/2022).  Marzetta Board, DPM

## 2022-02-17 DIAGNOSIS — E559 Vitamin D deficiency, unspecified: Secondary | ICD-10-CM | POA: Diagnosis not present

## 2022-02-17 DIAGNOSIS — E1149 Type 2 diabetes mellitus with other diabetic neurological complication: Secondary | ICD-10-CM | POA: Diagnosis not present

## 2022-02-17 DIAGNOSIS — E785 Hyperlipidemia, unspecified: Secondary | ICD-10-CM | POA: Diagnosis not present

## 2022-02-17 DIAGNOSIS — I10 Essential (primary) hypertension: Secondary | ICD-10-CM | POA: Diagnosis not present

## 2022-02-18 LAB — LIPID PANEL
Chol/HDL Ratio: 2.4 ratio (ref 0.0–4.4)
Cholesterol, Total: 152 mg/dL (ref 100–199)
HDL: 63 mg/dL (ref >39)
LDL Chol Calc (NIH): 75 mg/dL (ref 0–99)
Triglycerides: 73 mg/dL (ref 0–149)
VLDL Cholesterol Cal: 14 mg/dL (ref 5–40)

## 2022-02-18 LAB — CMP14+EGFR
ALT: 20 IU/L (ref 0–32)
AST: 26 IU/L (ref 0–40)
Albumin/Globulin Ratio: 1 — ABNORMAL LOW (ref 1.2–2.2)
Albumin: 4.2 g/dL (ref 3.7–4.7)
Alkaline Phosphatase: 75 IU/L (ref 44–121)
BUN/Creatinine Ratio: 12 (ref 12–28)
BUN: 21 mg/dL (ref 8–27)
Bilirubin Total: 0.5 mg/dL (ref 0.0–1.2)
CO2: 21 mmol/L (ref 20–29)
Calcium: 9.9 mg/dL (ref 8.7–10.3)
Chloride: 105 mmol/L (ref 96–106)
Creatinine, Ser: 1.76 mg/dL — ABNORMAL HIGH (ref 0.57–1.00)
Globulin, Total: 4.1 g/dL (ref 1.5–4.5)
Glucose: 109 mg/dL — ABNORMAL HIGH (ref 70–99)
Potassium: 3.9 mmol/L (ref 3.5–5.2)
Sodium: 142 mmol/L (ref 134–144)
Total Protein: 8.3 g/dL (ref 6.0–8.5)
eGFR: 29 mL/min/{1.73_m2} — ABNORMAL LOW (ref >59)

## 2022-02-18 LAB — VITAMIN D 25 HYDROXY (VIT D DEFICIENCY, FRACTURES): Vit D, 25-Hydroxy: 39.6 ng/mL (ref 30.0–100.0)

## 2022-02-18 LAB — HEMOGLOBIN A1C
Est. average glucose Bld gHb Est-mCnc: 146 mg/dL
Hgb A1c MFr Bld: 6.7 % — ABNORMAL HIGH (ref 4.8–5.6)

## 2022-02-20 ENCOUNTER — Other Ambulatory Visit: Payer: Self-pay | Admitting: Nurse Practitioner

## 2022-02-23 ENCOUNTER — Ambulatory Visit (INDEPENDENT_AMBULATORY_CARE_PROVIDER_SITE_OTHER): Payer: Medicare Other | Admitting: Family Medicine

## 2022-02-23 ENCOUNTER — Encounter: Payer: Self-pay | Admitting: Family Medicine

## 2022-02-23 VITALS — BP 122/74 | HR 90 | Ht 66.0 in | Wt 191.0 lb

## 2022-02-23 DIAGNOSIS — Z6831 Body mass index (BMI) 31.0-31.9, adult: Secondary | ICD-10-CM

## 2022-02-23 DIAGNOSIS — I10 Essential (primary) hypertension: Secondary | ICD-10-CM

## 2022-02-23 DIAGNOSIS — E1149 Type 2 diabetes mellitus with other diabetic neurological complication: Secondary | ICD-10-CM | POA: Diagnosis not present

## 2022-02-23 DIAGNOSIS — F324 Major depressive disorder, single episode, in partial remission: Secondary | ICD-10-CM

## 2022-02-23 DIAGNOSIS — E785 Hyperlipidemia, unspecified: Secondary | ICD-10-CM | POA: Diagnosis not present

## 2022-02-23 DIAGNOSIS — E039 Hypothyroidism, unspecified: Secondary | ICD-10-CM

## 2022-02-23 DIAGNOSIS — E6609 Other obesity due to excess calories: Secondary | ICD-10-CM

## 2022-02-23 NOTE — Patient Instructions (Addendum)
Annual exam Jan 14 or after, call if you need me sooner  Arrange for flu vaccine with nurse  Please get new covid booster when available  Nurse please add TSH to recent lab   Fasting lipid, HBa1C, cmp  and EGFR and TSH 3 days before Jan appt  Microalb today if possible  Colonoscopy is due in January  Thanks for choosing [facility], we consider it a privelige to serve you.

## 2022-02-25 ENCOUNTER — Encounter: Payer: Self-pay | Admitting: Family Medicine

## 2022-02-25 NOTE — Assessment & Plan Note (Signed)
  Patient re-educated about  the importance of commitment to a  minimum of 150 minutes of exercise per week as able.  The importance of healthy food choices with portion control discussed, as well as eating regularly and within a 12 hour window most days. The need to choose "clean , green" food 50 to 75% of the time is discussed, as well as to make water the primary drink and set a goal of 64 ounces water daily.       02/23/2022    9:21 AM 11/25/2021    3:12 PM 10/07/2021   11:18 AM  Weight /BMI  Weight 191 lb 194 lb 1.9 oz 193 lb 9 oz  Height '5\' 6"'$  (1.676 m) '5\' 6"'$  (1.676 m) '5\' 5"'$  (1.651 m)  BMI 30.83 kg/m2 31.33 kg/m2 32.21 kg/m2

## 2022-02-25 NOTE — Progress Notes (Signed)
Renee Collins     MRN: [id number]      DOB: Nov 16, 1940   HPI Renee Collins is here for follow up and re-evaluation of chronic medical conditions, medication management and review of any available recent lab and radiology data.  Preventive health is updated, specifically  Cancer screening and Immunization.   Questions or concerns regarding consultations or procedures which the PT has had in the interim are  addressed. The PT denies any adverse reactions to current medications since the last visit.  There are no new concerns.  There are no specific complaints   ROS Denies recent fever or chills. Denies sinus pressure, nasal congestion, ear pain or sore throat. Denies chest congestion, productive cough or wheezing. Denies chest pains, palpitations and leg swelling Denies abdominal pain, nausea, vomiting,diarrhea or constipation.   Denies dysuria, frequency, hesitancy or incontinence. Denies joint pain, swelling and limitation in mobility. Denies headaches, seizures, numbness, or tingling. Denies depression, anxiety or insomnia. Denies skin break down or rash.   PE  BP 122/74 (BP Location: Left Arm, Patient Position: Sitting, Cuff Size: Large)   Pulse 90   Ht '5\' 6"'$  (1.676 m)   Wt 191 lb (86.6 kg)   SpO2 95%   BMI 30.83 kg/m   Patient alert and oriented and in no cardiopulmonary distress.  HEENT: No facial asymmetry, EOMI,     Neck supple .  Chest: Clear to auscultation bilaterally.  CVS: S1, S2 no murmurs, no S3.Regular rate.  ABD: Soft non tender.   Ext: No edema  MS: Adequate ROM spine, shoulders, hips and knees.  Skin: Intact, no ulcerations or rash noted.  Psych: Good eye contact, normal affect. Memory intact not anxious or depressed appearing.  CNS: CN 2-12 intact, power,  normal throughout.no focal deficits noted.   Assessment & Plan  Essential hypertension Controlled, no change in medication DASH diet and commitment to daily physical activity for a minimum  of 30 minutes discussed and encouraged, as a part of hypertension management. The importance of attaining a healthy weight is also discussed.     02/23/2022    9:21 AM 11/25/2021    3:12 PM 10/07/2021   11:18 AM 09/15/2021   11:30 AM 09/15/2021   11:15 AM 09/15/2021   11:00 AM 09/15/2021   10:45 AM  BP/Weight  Systolic BP 094 709 628 366 294 765 465  Diastolic BP 74 80 76 69 89 66 65  Wt. (Lbs) 191 194.12 193.56      BMI 30.83 kg/m2 31.33 kg/m2 32.21 kg/m2           Hypothyroid Updated lab needed at/ before next visit.   Type 2 diabetes mellitus [facility]) Renee Collins is reminded of the importance of commitment to daily physical activity for 30 minutes or more, as able and the need to limit carbohydrate intake to 30 to 60 grams per meal to help with blood sugar control.  Controlled, no change in mMs. Dinan is reminded of the importance of daily foot exam, annual eye examination, and good blood sugar, blood pressure and cholesterol control.     Latest Ref Rng & Units 02/17/2022    9:25 AM 09/30/2021   10:53 AM 09/11/2021    9:11 AM 07/24/2021    2:02 PM 07/10/2021   10:36 AM  Diabetic Labs  HbA1c 4.8 - 5.6 % 6.7     6.6   Chol 100 - 199 mg/dL 152    179    HDL >39 mg/dL 63  67    Calc LDL 0 - 99 mg/dL 75    99    Triglycerides 0 - 149 mg/dL 73    70    Creatinine 0.57 - 1.00 mg/dL 1.76  2.00  1.64  1.69        02/23/2022    9:21 AM 11/25/2021    3:12 PM 10/07/2021   11:18 AM 09/15/2021   11:30 AM 09/15/2021   11:15 AM 09/15/2021   11:00 AM 09/15/2021   10:45 AM  BP/Weight  Systolic BP 017 494 496 759 163 846 659  Diastolic BP 74 80 76 69 89 66 65  Wt. (Lbs) 191 194.12 193.56      BMI 30.83 kg/m2 31.33 kg/m2 32.21 kg/m2          Latest Ref Rng & Units 02/23/2022    9:20 AM 11/26/2021   12:00 AM  Foot/eye exam completion dates  Eye Exam No Retinopathy  No Retinopathy      Foot Form Completion  Done      This result is from an external source.        Obesity,  unspecified  Patient re-educated about  the importance of commitment to a  minimum of 150 minutes of exercise per week as able.  The importance of healthy food choices with portion control discussed, as well as eating regularly and within a 12 hour window most days. The need to choose "clean , green" food 50 to 75% of the time is discussed, as well as to make water the primary drink and set a goal of 64 ounces water daily.       02/23/2022    9:21 AM 11/25/2021    3:12 PM 10/07/2021   11:18 AM  Weight /BMI  Weight 191 lb 194 lb 1.9 oz 193 lb 9 oz  Height '5\' 6"'$  (1.676 m) '5\' 6"'$  (1.676 m) '5\' 5"'$  (1.651 m)  BMI 30.83 kg/m2 31.33 kg/m2 32.21 kg/m2      Dyslipidemia Hyperlipidemia:Low fat diet discussed and encouraged.   Lipid Panel  Lab Results  Component Value Date   CHOL 152 02/17/2022   HDL 63 02/17/2022   LDLCALC 75 02/17/2022   TRIG 73 02/17/2022   CHOLHDL 2.4 02/17/2022       Depression, major, single episode, in partial remission (HCC) Controlled, no change in medication

## 2022-02-25 NOTE — Assessment & Plan Note (Signed)
Controlled, no change in medication  

## 2022-02-25 NOTE — Assessment & Plan Note (Signed)
Ms. Noack is reminded of the importance of commitment to daily physical activity for 30 minutes or more, as able and the need to limit carbohydrate intake to 30 to 60 grams per meal to help with blood sugar control.  Controlled, no change in mMs. Kuznia is reminded of the importance of daily foot exam, annual eye examination, and good blood sugar, blood pressure and cholesterol control.     Latest Ref Rng & Units 02/17/2022    9:25 AM 09/30/2021   10:53 AM 09/11/2021    9:11 AM 07/24/2021    2:02 PM 07/10/2021   10:36 AM  Diabetic Labs  HbA1c 4.8 - 5.6 % 6.7     6.6   Chol 100 - 199 mg/dL 152    179    HDL >39 mg/dL 63    67    Calc LDL 0 - 99 mg/dL 75    99    Triglycerides 0 - 149 mg/dL 73    70    Creatinine 0.57 - 1.00 mg/dL 1.76  2.00  1.64  1.69        02/23/2022    9:21 AM 11/25/2021    3:12 PM 10/07/2021   11:18 AM 09/15/2021   11:30 AM 09/15/2021   11:15 AM 09/15/2021   11:00 AM 09/15/2021   10:45 AM  BP/Weight  Systolic BP 387 564 332 951 884 166 063  Diastolic BP 74 80 76 69 89 66 65  Wt. (Lbs) 191 194.12 193.56      BMI 30.83 kg/m2 31.33 kg/m2 32.21 kg/m2          Latest Ref Rng & Units 02/23/2022    9:20 AM 11/26/2021   12:00 AM  Foot/eye exam completion dates  Eye Exam No Retinopathy  No Retinopathy      Foot Form Completion  Done      This result is from an external source.

## 2022-02-25 NOTE — Assessment & Plan Note (Signed)
Controlled, no change in medication DASH diet and commitment to daily physical activity for a minimum of 30 minutes discussed and encouraged, as a part of hypertension management. The importance of attaining a healthy weight is also discussed.     02/23/2022    9:21 AM 11/25/2021    3:12 PM 10/07/2021   11:18 AM 09/15/2021   11:30 AM 09/15/2021   11:15 AM 09/15/2021   11:00 AM 09/15/2021   10:45 AM  BP/Weight  Systolic BP 895 702 202 669 167 561 254  Diastolic BP 74 80 76 69 89 66 65  Wt. (Lbs) 191 194.12 193.56      BMI 30.83 kg/m2 31.33 kg/m2 32.21 kg/m2

## 2022-02-25 NOTE — Assessment & Plan Note (Signed)
Hyperlipidemia:Low fat diet discussed and encouraged.   Lipid Panel  Lab Results  Component Value Date   CHOL 152 02/17/2022   HDL 63 02/17/2022   LDLCALC 75 02/17/2022   TRIG 73 02/17/2022   CHOLHDL 2.4 02/17/2022

## 2022-02-25 NOTE — Assessment & Plan Note (Signed)
Updated lab needed at/ before next visit.   

## 2022-02-26 LAB — MICROALBUMIN / CREATININE URINE RATIO
Creatinine, Urine: 103.5 mg/dL
Microalb/Creat Ratio: 65 mg/g creat — ABNORMAL HIGH (ref 0–29)
Microalbumin, Urine: 67.3 ug/mL

## 2022-03-05 DIAGNOSIS — E1129 Type 2 diabetes mellitus with other diabetic kidney complication: Secondary | ICD-10-CM | POA: Diagnosis not present

## 2022-03-05 DIAGNOSIS — N189 Chronic kidney disease, unspecified: Secondary | ICD-10-CM | POA: Diagnosis not present

## 2022-03-05 DIAGNOSIS — E211 Secondary hyperparathyroidism, not elsewhere classified: Secondary | ICD-10-CM | POA: Diagnosis not present

## 2022-03-05 DIAGNOSIS — R809 Proteinuria, unspecified: Secondary | ICD-10-CM | POA: Diagnosis not present

## 2022-03-05 DIAGNOSIS — E1122 Type 2 diabetes mellitus with diabetic chronic kidney disease: Secondary | ICD-10-CM | POA: Diagnosis not present

## 2022-03-11 ENCOUNTER — Other Ambulatory Visit: Payer: Self-pay | Admitting: Family Medicine

## 2022-03-11 DIAGNOSIS — R809 Proteinuria, unspecified: Secondary | ICD-10-CM | POA: Diagnosis not present

## 2022-03-11 DIAGNOSIS — E211 Secondary hyperparathyroidism, not elsewhere classified: Secondary | ICD-10-CM | POA: Diagnosis not present

## 2022-03-11 DIAGNOSIS — D638 Anemia in other chronic diseases classified elsewhere: Secondary | ICD-10-CM | POA: Diagnosis not present

## 2022-03-11 DIAGNOSIS — E1122 Type 2 diabetes mellitus with diabetic chronic kidney disease: Secondary | ICD-10-CM | POA: Diagnosis not present

## 2022-03-11 DIAGNOSIS — I129 Hypertensive chronic kidney disease with stage 1 through stage 4 chronic kidney disease, or unspecified chronic kidney disease: Secondary | ICD-10-CM | POA: Diagnosis not present

## 2022-03-11 DIAGNOSIS — N189 Chronic kidney disease, unspecified: Secondary | ICD-10-CM | POA: Diagnosis not present

## 2022-03-11 DIAGNOSIS — E1129 Type 2 diabetes mellitus with other diabetic kidney complication: Secondary | ICD-10-CM | POA: Diagnosis not present

## 2022-03-18 ENCOUNTER — Ambulatory Visit (INDEPENDENT_AMBULATORY_CARE_PROVIDER_SITE_OTHER): Payer: Medicare Other

## 2022-03-18 DIAGNOSIS — Z23 Encounter for immunization: Secondary | ICD-10-CM | POA: Diagnosis not present

## 2022-04-08 ENCOUNTER — Ambulatory Visit (HOSPITAL_COMMUNITY)
Admission: RE | Admit: 2022-04-08 | Discharge: 2022-04-08 | Disposition: A | Discharge status: Home / Self Care | Payer: Medicare Other | Source: Ambulatory Visit | Attending: Physician Assistant | Admitting: Physician Assistant

## 2022-04-08 ENCOUNTER — Inpatient Hospital Stay: Payer: Medicare Other | Attending: Physician Assistant

## 2022-04-08 DIAGNOSIS — N184 Chronic kidney disease, stage 4 (severe): Secondary | ICD-10-CM | POA: Diagnosis not present

## 2022-04-08 DIAGNOSIS — D500 Iron deficiency anemia secondary to blood loss (chronic): Secondary | ICD-10-CM

## 2022-04-08 DIAGNOSIS — D472 Monoclonal gammopathy: Secondary | ICD-10-CM | POA: Insufficient documentation

## 2022-04-08 DIAGNOSIS — E1122 Type 2 diabetes mellitus with diabetic chronic kidney disease: Secondary | ICD-10-CM | POA: Insufficient documentation

## 2022-04-08 DIAGNOSIS — D649 Anemia, unspecified: Secondary | ICD-10-CM | POA: Diagnosis not present

## 2022-04-08 DIAGNOSIS — D631 Anemia in chronic kidney disease: Secondary | ICD-10-CM

## 2022-04-08 DIAGNOSIS — I129 Hypertensive chronic kidney disease with stage 1 through stage 4 chronic kidney disease, or unspecified chronic kidney disease: Secondary | ICD-10-CM | POA: Insufficient documentation

## 2022-04-08 LAB — COMPREHENSIVE METABOLIC PANEL
ALT: 21 U/L (ref 0–44)
AST: 25 U/L (ref 15–41)
Albumin: 3.8 g/dL (ref 3.5–5.0)
Alkaline Phosphatase: 65 U/L (ref 38–126)
Anion gap: 6 (ref 5–15)
BUN: 23 mg/dL (ref 8–23)
CO2: 23 mmol/L (ref 22–32)
Calcium: 9.3 mg/dL (ref 8.9–10.3)
Chloride: 110 mmol/L (ref 98–111)
Creatinine, Ser: 1.71 mg/dL — ABNORMAL HIGH (ref 0.44–1.00)
GFR, Estimated: 30 mL/min — ABNORMAL LOW (ref >60)
Glucose, Bld: 121 mg/dL — ABNORMAL HIGH (ref 70–99)
Potassium: 3.6 mmol/L (ref 3.5–5.1)
Sodium: 139 mmol/L (ref 135–145)
Total Bilirubin: 0.7 mg/dL (ref 0.3–1.2)
Total Protein: 9 g/dL — ABNORMAL HIGH (ref 6.5–8.1)

## 2022-04-08 LAB — CBC WITH DIFFERENTIAL/PLATELET
Abs Immature Granulocytes: 0.02 10*3/uL (ref 0.00–0.07)
Basophils Absolute: 0 10*3/uL (ref 0.0–0.1)
Basophils Relative: 0 %
Eosinophils Absolute: 0.3 10*3/uL (ref 0.0–0.5)
Eosinophils Relative: 5 %
HCT: 34.5 % — ABNORMAL LOW (ref 36.0–46.0)
Hemoglobin: 10.8 g/dL — ABNORMAL LOW (ref 12.0–15.0)
Immature Granulocytes: 0 %
Lymphocytes Relative: 27 %
Lymphs Abs: 1.6 10*3/uL (ref 0.7–4.0)
MCH: 26.8 pg (ref 26.0–34.0)
MCHC: 31.3 g/dL (ref 30.0–36.0)
MCV: 85.6 fL (ref 80.0–100.0)
Monocytes Absolute: 0.4 10*3/uL (ref 0.1–1.0)
Monocytes Relative: 6 %
Neutro Abs: 3.5 10*3/uL (ref 1.7–7.7)
Neutrophils Relative %: 62 %
Platelets: 220 10*3/uL (ref 150–400)
RBC: 4.03 MIL/uL (ref 3.87–5.11)
RDW: 13.9 % (ref 11.5–15.5)
WBC: 5.8 10*3/uL (ref 4.0–10.5)
nRBC: 0 % (ref 0.0–0.2)

## 2022-04-08 LAB — IRON AND TIBC
Iron: 85 ug/dL (ref 28–170)
Saturation Ratios: 28 % (ref 10.4–31.8)
TIBC: 299 ug/dL (ref 250–450)
UIBC: 214 ug/dL

## 2022-04-08 LAB — FERRITIN: Ferritin: 138 ng/mL (ref 11–307)

## 2022-04-08 LAB — LACTATE DEHYDROGENASE: LDH: 142 U/L (ref 98–192)

## 2022-04-09 LAB — KAPPA/LAMBDA LIGHT CHAINS
Kappa free light chain: 236 mg/L — ABNORMAL HIGH (ref 3.3–19.4)
Kappa, lambda light chain ratio: 12.69 — ABNORMAL HIGH (ref 0.26–1.65)
Lambda free light chains: 18.6 mg/L (ref 5.7–26.3)

## 2022-04-10 ENCOUNTER — Other Ambulatory Visit: Payer: Self-pay | Admitting: Nurse Practitioner

## 2022-04-13 LAB — PROTEIN ELECTROPHORESIS, SERUM
A/G Ratio: 0.8 (ref 0.7–1.7)
Albumin ELP: 3.8 g/dL (ref 2.9–4.4)
Alpha-1-Globulin: 0.3 g/dL (ref 0.0–0.4)
Alpha-2-Globulin: 0.9 g/dL (ref 0.4–1.0)
Beta Globulin: 1 g/dL (ref 0.7–1.3)
Gamma Globulin: 2.3 g/dL — ABNORMAL HIGH (ref 0.4–1.8)
Globulin, Total: 4.5 g/dL — ABNORMAL HIGH (ref 2.2–3.9)
M-Spike, %: 2 g/dL — ABNORMAL HIGH
Total Protein ELP: 8.3 g/dL (ref 6.0–8.5)

## 2022-04-15 ENCOUNTER — Ambulatory Visit: Payer: Medicare Other | Admitting: Physician Assistant

## 2022-04-19 ENCOUNTER — Other Ambulatory Visit: Payer: Self-pay

## 2022-04-19 ENCOUNTER — Inpatient Hospital Stay (HOSPITAL_BASED_OUTPATIENT_CLINIC_OR_DEPARTMENT_OTHER): Payer: Medicare Other | Admitting: Physician Assistant

## 2022-04-19 DIAGNOSIS — N1832 Chronic kidney disease, stage 3b: Secondary | ICD-10-CM

## 2022-04-19 DIAGNOSIS — D472 Monoclonal gammopathy: Secondary | ICD-10-CM

## 2022-04-19 DIAGNOSIS — D631 Anemia in chronic kidney disease: Secondary | ICD-10-CM

## 2022-04-19 NOTE — Progress Notes (Signed)
[facility] HEMATOLOGY-ONCOLOGY TeleHEALTH VISIT PROGRESS NOTE   I connected with Renee Collins on 04/19/22 at  9:30 AM EDT by telephone and verified that I am speaking with the correct person using two identifiers.  I discussed the limitations, risks, security and privacy concerns of performing an evaluation and management service by telemedicine and the availability of in-person appointments. I also discussed with the patient that there may be a patient responsible charge related to this service. The patient expressed understanding and agreed to proceed.  Other persons participating in the visit and their role in the encounter: None  Patient's location: Home  Provider's location: [place], [employer], [place] [address] [place] [address]  DIAGNOSIS: Follow-up for MGUS and normocytic anemia  CURRENT THERAPY: Intermittent Feraheme (last given 02/15/2020)  INTERVAL HISTORY: Renee Collins 81 y.o. female and I connected via telephone visit today.  The patient was last seen for a routine follow-up for MGUS and normocytic anemia by Tarri Abernethy PA-C on 10/07/21.   The patient is feeling well today without any concerning complaints.  She denies any changes in her health since last being seen.  She denies any abnormal bleeding or bruising including epistaxis, gingival bleeding, hemoptysis, hematemesis, melena, or hematochezia.  She reports stable fatigue.  Denies any lightheadedness, dizziness, chest pain, shortness of breath, or syncope.  She had surgery on the cervical spine in March 2023 and she no longer is having paresthesias and neuropathy.  She denies any unusual bone pain.  Denies any vision changes, tinnitus, strokelike symptoms, or headaches.  She has chronic kidney disease for which she sees a nephrologist.  Her next appointment is in December 2023 per patient report.  She recently had a repeat bone scan and repeat CBC, CMP, iron studies, kappa/lambda  light chain, and protein electrophoresis.  The purpose of the visit is to review the results and recommendations.  MEDICAL HISTORY: Past Medical History:  Diagnosis Date   Anemia    Chronic back pain    Chronic kidney disease    Depression    GERD (gastroesophageal reflux disease)    Heart murmur    Hyperlipidemia    Hypertension    Hypothyroidism    Pre-diabetes     ALLERGIES:  has No Known Allergies.  MEDICATIONS:  Current Outpatient Medications  Medication Sig Dispense Refill   acetaminophen (TYLENOL) 650 MG CR tablet Take 650 mg by mouth every 8 (eight) hours as needed for pain.      amLODipine (NORVASC) 10 MG tablet TAKE 1 TABLET BY MOUTH DAILY 100 tablet 2   aspirin 81 MG EC tablet Take 81 mg by mouth daily. 30 tablet 12   benazepril (LOTENSIN) 5 MG tablet Take 5 mg by mouth daily.     buPROPion (WELLBUTRIN XL) 150 MG 24 hr tablet TAKE 1 TABLET BY MOUTH  DAILY 90 tablet 3   calcitRIOL (ROCALTROL) 0.25 MCG capsule Take 0.25 mcg by mouth every Monday, Wednesday, and Friday.     Calcium-Magnesium-Vitamin D (CALCIUM 1200+D3 PO) Take 1 tablet by mouth 2 (two) times daily.     diphenhydrAMINE (BENADRYL) 25 mg capsule Take 25 mg by mouth at bedtime as needed for allergies or sleep.     docusate sodium (COLACE) 100 MG capsule Take 1 capsule (100 mg total) by mouth 2 (two) times daily. 60 capsule 2   gabapentin (NEURONTIN) 100 MG capsule TAKE 1 TO 2 CAPSULES BY  MOUTH 3 TIMES DAILY  600 capsule 2   hydrochlorothiazide (HYDRODIURIL) 25 MG tablet TAKE 1 TABLET BY MOUTH  DAILY 90 tablet 3   levothyroxine (SYNTHROID) 88 MCG tablet TAKE 1 TABLET BY MOUTH ONCE DAILY 1/2 HOUR BEFORE FIRST MEAL OF THE DAY. DRINK  WATER 60 tablet 5   Misc Natural Products (JOINT HEALTH PO) Take 1 capsule by mouth daily.     Multiple Vitamin (MULTIVITAMIN WITH MINERALS) TABS tablet Take 1 tablet by mouth daily.     omeprazole (PRILOSEC) 20 MG capsule Take 20 mg by mouth daily as needed (for acid reflux.).       pravastatin (PRAVACHOL) 20 MG tablet TAKE 1 TABLET BY MOUTH IN  THE EVENING 90 tablet 3   No current facility-administered medications for this visit.    SURGICAL HISTORY:  Past Surgical History:  Procedure Laterality Date   ANTERIOR CERVICAL DECOMP/DISCECTOMY FUSION N/A 09/15/2021   Procedure: Anterior Cervical Decompression Discectomy Fusion Cervical three-four, Cervical four-five;  Surgeon: Vallarie Mare, MD;  Location: [place];  Service: Neurosurgery;  Laterality: N/A;   BONE MARROW ASPIRATION Left 05/16/15   BONE MARROW BIOPSY Left 05/16/15   COLONOSCOPY  03/30/2012   Procedure: COLONOSCOPY;  Surgeon: Rogene Houston, MD;  [facility];  Service: Endoscopy;  Laterality: N/A;  730   COLONOSCOPY N/A 06/30/2017   Procedure: COLONOSCOPY;  Surgeon: Rogene Houston, MD;  [facility];  Service: Endoscopy;  Laterality: N/A;  2:00   LAMINECTOMY  1980's   POLYPECTOMY  06/30/2017   Procedure: POLYPECTOMY;  Surgeon: Rogene Houston, MD;  [facility];  Service: Endoscopy;;  colon   SPINE SURGERY  approx 1983   dr Joya Salm   TOTAL ABDOMINAL HYSTERECTOMY  approx 1993   fibroids     REVIEW OF SYSTEMS:   Review of Systems  Constitutional: Positive for stable chronic fatigue.  Negative for appetite change, chills, fever and unexpected weight change.  HENT: Negative for mouth sores, nosebleeds, sore throat and trouble swallowing.   Eyes: Negative for eye problems and icterus.  Respiratory: Negative for cough, hemoptysis, shortness of breath and wheezing.   Cardiovascular: Negative for chest pain and leg swelling.  Gastrointestinal: Negative for abdominal pain, constipation, diarrhea, nausea and vomiting.  Genitourinary: Negative for bladder incontinence, difficulty urinating, dysuria, frequency and hematuria.   Musculoskeletal: Negative for back pain, gait problem, neck pain and neck stiffness.  Skin: Negative for itching and rash.  Neurological: Negative for  dizziness, extremity weakness, gait problem, headaches, light-headedness and seizures.  Hematological: Negative for adenopathy. Does not bruise/bleed easily.  Psychiatric/Behavioral: Negative for confusion, depression and sleep disturbance. The patient is not nervous/anxious.     PHYSICAL EXAMINATION:  There were no vitals taken for this visit.  ECOG PERFORMANCE STATUS: 1  Physical Exam  Constitutional: Oriented to person, place, and time Psychiatric: Mood, memory and judgment normal.  Vitals reviewed.  LABORATORY DATA: Lab Results  Component Value Date   WBC 5.8 04/08/2022   HGB 10.8 (L) 04/08/2022   HCT 34.5 (L) 04/08/2022   MCV 85.6 04/08/2022   PLT 220 04/08/2022      Chemistry      Component Value Date/Time   NA 139 04/08/2022 0954   NA 142 02/17/2022 0925   K 3.6 04/08/2022 0954   CL 110 04/08/2022 0954   CO2 23 04/08/2022 0954   BUN 23 04/08/2022 0954   BUN 21 02/17/2022 0925   CREATININE 1.71 (H) 04/08/2022 0954   CREATININE 1.58 (H) 11/30/2019  1145      Component Value Date/Time   CALCIUM 9.3 04/08/2022 0954   ALKPHOS 65 04/08/2022 0954   AST 25 04/08/2022 0954   ALT 21 04/08/2022 0954   BILITOT 0.7 04/08/2022 0954   BILITOT 0.5 02/17/2022 0925       RADIOGRAPHIC STUDIES:  DG Bone Survey Met  Result Date: 04/09/2022 CLINICAL DATA:  MGUS EXAM: METASTATIC BONE SURVEY COMPARISON:  March 18, 2021 FINDINGS: No evidence of lucent lesions or bony destruction throughout the axial or appendicular skeleton. No focal osseous lesion is seen. There is multifocal degenerative joint changes including the cervicothoracic lumbar spine, both shoulders, bilateral knees. Hiatal hernia is identified. Prior fixation of the cervical spine noted. Stable calcific densities noted in the pelvis unchanged compared prior exams. IMPRESSION: No evidence of lytic lesion or bony destruction throughout the axial or appendicular skeleton. Multifocal degenerative joint changes of the  spine and extremities. Electronically Signed   By: Abelardo Diesel M.D.   On: 04/09/2022 15:18     ASSESSMENT/PLAN:  1.  IgG kappa MGUS: - Bone marrow biopsy on 05/16/2015 shows limited bone marrow sample with no increase in plasma cells in the very limited material present.  Cytogenetics-65, XX.  FISH panel was normal. - Bone survey on 02/22/2017 showed Collins-defined calcified lesion in the distal right femur.  MRI of the right femur on 03/02/2017 showed no osseous lesion. -PET scan on 02/25/2020 was negative for any myeloma lesions or other malignancies. - Most recent skeletal survey (04/08/2022): No lytic or suspicious osseous lesion throughout the axial or appendicular skeleton. Just degenerative joint disease.  - Immunofixation shows monoclonal protein with IgG kappa specificity - No B symptoms or new bony pain   - Most recent labs (04/08/22): M spike relatively stable at 2.0, stable elevated kappa 236.0, normal lambda 18.6, stable elevated ratio 12.69.  LDH normal. - No CRAB features at this time (labs 04/08/22) - creatinine stable 1.71 (chronic CKD), calcium 9.3 Hgb 10.8 - MGUS labs are stable at this visit.   - PLAN:  Repeat MGUS/myeloma panel and RTC in 6 months.  Skeletal survey due in 1 year.   2.  Normocytic anemia: -This is from chronic kidney disease and relative iron deficiency.  She last received IV Feraheme on 02/15/2020 - Denies any bleeding per rectum or melena   - Most recent labs (04/08/22): Ferritin 138, iron saturation 28%, Hgb 10.8/MCV 85.6. Overall, stable.  - Hemoglobin is at goal considering underlying CKD stage IIIb/IV - If patient has worsening anemia with Hgb < 10.0, will consider starting her on ESA - PLAN: No treatment needed at this time.  Repeat anemia panel in 6 months.   3.  CKD stage IIIb/IV: - Per note by Dr. Theador Hawthorne, CKD is secondary to diabetes mellitus - Creatinine is usually between 1.5 and 1.8 - Renal ultrasound on 01/24/2020 shows small right renal cyst  with no additional findings. - Most recent CMP (04/08/22): Showed stable creatinine at 1.71. She is expected to follow up with him in December 2023 with BUN 23, GFR 30 - PLAN: Patient to follow up with Dr. Theador Hawthorne in December for routine follow up.    I discussed the assessment and treatment plan with the patient. The patient was provided an opportunity to ask questions and all were answered. The patient agreed with the plan and demonstrated an understanding of the instructions.  The patient was advised to call back or seek an in-person evaluation if the symptoms worsen or if the  condition fails to improve as anticipated.  I provided 20 minutes of non face-to-face telephone visit time during this encounter, and > 50% was spent counseling as documented under my assessment & plan.  Brandilynn Taormina L Jaret Coppedge, PA-C 04/19/2022 9:59 AM  No orders of the defined types were placed in this encounter.    Carolena Fairbank L Trai Ells, PA-C 04/19/22

## 2022-04-20 ENCOUNTER — Ambulatory Visit (INDEPENDENT_AMBULATORY_CARE_PROVIDER_SITE_OTHER): Payer: Medicare Other | Admitting: Internal Medicine

## 2022-04-20 ENCOUNTER — Encounter: Payer: Self-pay | Admitting: Internal Medicine

## 2022-04-20 VITALS — BP 144/75 | HR 91 | Resp 16 | Ht 65.5 in | Wt 192.4 lb

## 2022-04-20 DIAGNOSIS — Z0000 Encounter for general adult medical examination without abnormal findings: Secondary | ICD-10-CM | POA: Diagnosis not present

## 2022-04-20 MED ORDER — OMEPRAZOLE 20 MG PO CPDR: 20.0000 mg | DELAYED_RELEASE_CAPSULE | Freq: Every day | ORAL | 1 refills | Status: DC | PRN

## 2022-04-20 NOTE — Progress Notes (Signed)
Subjective:   Renee Collins is a 81 y.o. female who presents for Medicare Annual (Subsequent) preventive examination.  Review of Systems    Review of Systems  All other systems reviewed and are negative.     Objective:    Today's Vitals   04/20/22 0905 04/20/22 0911  BP: (!) 144/75   Pulse: 91   Resp: 16   SpO2: 97%   Weight: 192 lb 6.4 oz (87.3 kg)   Height: 5' 5.5" (1.664 m)   PainSc:  7    Body mass index is 31.53 kg/m.     10/07/2021   11:28 AM 09/11/2021    8:58 AM 06/15/2021   10:53 AM 04/15/2021    9:24 AM 03/25/2021   11:14 AM 10/23/2020   12:04 PM 02/08/2020   10:54 AM  Advanced Directives  Does Patient Have a Medical Advance Directive? No Yes Yes Yes Yes No No  Type of [employer] Power of [place];Living will    Does patient want to make changes to medical advance directive?   No - Patient declined  No - Patient declined  No - Patient declined  Copy of Healthcare Power of Attorney in Chart?    Yes - validated most recent copy scanned in chart (See row information)     Would patient like information on creating a medical advance directive? No - Patient declined     No - Patient declined No - Patient declined    Current Medications (verified) Outpatient Encounter Medications as of 04/20/2022  Medication Sig   acetaminophen (TYLENOL) 650 MG CR tablet Take 650 mg by mouth every 8 (eight) hours as needed for pain.    amLODipine (NORVASC) 10 MG tablet TAKE 1 TABLET BY MOUTH DAILY   aspirin 81 MG EC tablet Take 81 mg by mouth daily.   benazepril (LOTENSIN) 5 MG tablet Take 5 mg by mouth daily.   buPROPion (WELLBUTRIN XL) 150 MG 24 hr tablet TAKE 1 TABLET BY MOUTH  DAILY   calcitRIOL (ROCALTROL) 0.25 MCG capsule Take 0.25 mcg by mouth every Monday, Wednesday, and Friday.   Calcium-Magnesium-Vitamin D (CALCIUM 1200+D3 PO) Take 1 tablet by mouth 2 (two) times daily.   diphenhydrAMINE  (BENADRYL) 25 mg capsule Take 25 mg by mouth at bedtime as needed for allergies or sleep.   docusate sodium (COLACE) 100 MG capsule Take 1 capsule (100 mg total) by mouth 2 (two) times daily.   gabapentin (NEURONTIN) 100 MG capsule TAKE 1 TO 2 CAPSULES BY  MOUTH 3 TIMES DAILY   hydrochlorothiazide (HYDRODIURIL) 25 MG tablet TAKE 1 TABLET BY MOUTH  DAILY   levothyroxine (SYNTHROID) 88 MCG tablet TAKE 1 TABLET BY MOUTH ONCE DAILY 1/2 HOUR BEFORE FIRST MEAL OF THE DAY. DRINK  WATER   Misc Natural Products (JOINT HEALTH PO) Take 1 capsule by mouth daily.   Multiple Vitamin (MULTIVITAMIN WITH MINERALS) TABS tablet Take 1 tablet by mouth daily.   pravastatin (PRAVACHOL) 20 MG tablet TAKE 1 TABLET BY MOUTH IN  THE EVENING   [DISCONTINUED] omeprazole (PRILOSEC) 20 MG capsule Take 20 mg by mouth daily as needed (for acid reflux.).    omeprazole (PRILOSEC) 20 MG capsule Take 1 capsule (20 mg total) by mouth daily as needed (for acid reflux.).   No facility-administered encounter medications on file as of 04/20/2022.    Allergies (verified) Patient has no known allergies.   History: Past Medical History:  Diagnosis Date   Anemia    Chronic back pain    Chronic kidney disease    Depression    GERD (gastroesophageal reflux disease)    Heart murmur    Hyperlipidemia    Hypertension    Hypothyroidism    Pre-diabetes    Past Surgical History:  Procedure Laterality Date   ANTERIOR CERVICAL DECOMP/DISCECTOMY FUSION N/A 09/15/2021   Procedure: Anterior Cervical Decompression Discectomy Fusion Cervical three-four, Cervical four-five;  Surgeon: Bedelia Person, MD;  Location: [facility] OR;  Service: Neurosurgery;  Laterality: N/A;   BONE MARROW ASPIRATION Left 05/16/15   BONE MARROW BIOPSY Left 05/16/15   COLONOSCOPY  03/30/2012   Procedure: COLONOSCOPY;  Surgeon: Malissa Hippo, MD;  Location[facility];  Service: Endoscopy;  Laterality: N/A;  730   COLONOSCOPY N/A 06/30/2017   Procedure:  COLONOSCOPY;  Surgeon: Malissa Hippo, MD;  Location: [facility];  Service: Endoscopy;  Laterality: N/A;  2:00   LAMINECTOMY  1980's   POLYPECTOMY  06/30/2017   Procedure: POLYPECTOMY;  Surgeon: Malissa Hippo, MD;  Location: [facility];  Service: Endoscopy;;  colon   SPINE SURGERY  approx 1983   dr Jeral Fruit   TOTAL ABDOMINAL HYSTERECTOMY  approx 1993   fibroids    Family History  Problem Relation Age of Onset   Heart attack Mother 62   Heart disease Mother    Cancer Mother        type unknown   Diabetes Mother    Kidney failure Father    Kidney disease Father    Myasthenia gravis Brother    Diabetes Sister    Breast cancer Sister 36   Heart disease Brother    Diabetes Sister    Sickle cell anemia Sister    Cancer Sister        unknown type   Social History   Socioeconomic History   Marital status: Divorced    Spouse name: Not on file   Number of children: 3   Years of education: Not on file   Highest education level: Not on file  Occupational History   Occupation: LPN  Tobacco Use   Smoking status: Never   Smokeless tobacco: Never  Vaping Use   Vaping Use: Never used  Substance and Sexual Activity   Alcohol use: No   Drug use: No   Sexual activity: Not Currently  Other Topics Concern   Not on file  Social History Narrative   Pt has 2 living adult children, 1 desease at age 63 secondary congential heart disease.   7 grandchildren.   Social Determinants of Health   Financial Resource Strain: Low Risk  (04/15/2021)   Overall Financial Resource Strain (CARDIA)    Difficulty of Paying Living Expenses: Not hard at all  Food Insecurity: No Food Insecurity (04/15/2021)   Hunger Vital Sign    Worried About Running Out of Food in the Last Year: Never true    Ran Out of Food in the Last Year: Never true  Transportation Needs: Unmet Transportation Needs (04/15/2021)   PRAPARE - Administrator, Civil Service (Medical): Yes    Lack of Transportation  (Non-Medical): Yes  Physical Activity: Insufficiently Active (04/15/2021)   Exercise Vital Sign    Days of Exercise per Week: 3 days    Minutes of Exercise per Session: 30 min  Stress: No Stress Concern Present (04/15/2021)   [employer] of Occupational Health - Occupational Stress Questionnaire  Feeling of Stress : Not at all  Social Connections: Moderately Integrated (04/15/2021)   Social Connection and Isolation Panel [NHANES]    Frequency of Communication with Friends and Family: More than three times a week    Frequency of Social Gatherings with Friends and Family: More than three times a week    Attends Religious Services: 1 to 4 times per year    Active Member of [employer] or Organizations: Yes    Attends Archivist Meetings: 1 to 4 times per year    Marital Status: Divorced    Tobacco Counseling Counseling given: Not Answered   Clinical Intake:  Pre-visit preparation completed: Yes  Pain : 0-10 Pain Score: 7  Pain Type: Chronic pain Pain Location: Back Pain Orientation: Lower Pain Descriptors / Indicators: Aching Pain Onset: More than a month ago Pain Frequency: Constant Pain Relieving Factors: Patient takes Gabapentin and it helps with pain. Tylenol PRN  Pain Relieving Factors: Patient takes Gabapentin and it helps with pain. Tylenol PRN  Nutritional Status: BMI > 30  Obese Diabetes: No  How often do you need to have someone help you when you read instructions, pamphlets, or other written materials from your doctor or pharmacy?: 1 - Never What is the last grade level you completed in school?: LPN  Diabetic?No         Activities of Daily Living    09/11/2021    9:02 AM 09/11/2021    8:57 AM  In your present state of health, do you have any difficulty performing the following activities:  Hearing?  0  Vision?  0  Difficulty concentrating or making decisions?  0  Walking or climbing stairs?  0  Dressing or bathing?  0  Doing errands,  shopping? 0     Patient Care Team: [name], MD as PCP - General (Family Medicine) Josue Hector, MD as PCP - Cardiology (Cardiology) Whitney Muse Kelby Fam, MD (Inactive) as Consulting Physician (Hematology and Oncology) Rutherford Guys, MD as Consulting Physician (Ophthalmology) [name], MD  Indicate any recent Medical Services you may have received from other than [facility] providers in the past year (date may be approximate).     Assessment:   This is a routine wellness examination for Randall.  Hearing/Vision screen No results found.  Dietary issues and exercise activities discussed:     Goals Addressed             This Visit's Progress    Exercise 3x per week (30 min per time)       Would like to get back to the [facility]. She has been helping with granddaughter. Still has this same goal.        Depression Screen    02/23/2022    9:25 AM 11/25/2021    3:12 PM 07/24/2021    1:04 PM 07/10/2021   10:35 AM 07/10/2021   10:01 AM 04/22/2021    9:52 AM 04/15/2021    9:19 AM  PHQ 2/9 Scores  PHQ - 2 Score 0 0 0 0 0 0 0  PHQ- 9 Score      0     Fall Risk    04/20/2022    9:06 AM 02/23/2022    9:25 AM 11/25/2021    3:12 PM 07/24/2021    1:04 PM 07/10/2021   10:01 AM  [place] in the past year? 0 0 0 0 0  Number falls in past yr: 0 0 0 0  0  Injury with Fall? 0 0 0 0 0  Risk for fall due to :  No Fall Risks No Fall Risks No Fall Risks No Fall Risks  Follow up  Falls evaluation completed Falls evaluation completed Falls evaluation completed Falls evaluation completed    [place]:  Any stairs in or around the home? No  If so, are there any without handrails? No  Home free of loose throw rugs in walkways, pet beds, electrical cords, etc? Yes  Adequate lighting in your home to reduce risk of falls? Yes   ASSISTIVE DEVICES UTILIZED TO PREVENT FALLS:  Life alert? No  Use of a cane, walker or w/c? No  Grab bars  in the bathroom? Yes  Shower chair or bench in shower? No  Elevated toilet seat or a handicapped toilet? No    Cognitive Function:        04/15/2021    9:27 AM 04/01/2020   10:07 AM 03/30/2019   10:05 AM 03/27/2018    9:24 AM 10/25/2016    9:41 AM  6CIT Screen  What Year? 0 points 0 points 0 points 0 points 0 points  What month? 0 points 0 points 0 points 0 points 0 points  What time? 0 points 0 points 0 points 0 points 0 points  Count back from 20 0 points 0 points 0 points 0 points 0 points  Months in reverse 2 points 0 points 0 points 0 points 0 points  Repeat phrase 0 points 0 points 0 points 4 points 0 points  Total Score 2 points 0 points 0 points 4 points 0 points    Immunizations Immunization History  Administered Date(s) Administered   Fluad Quad(high Dose 65+) 02/19/2019, 03/25/2020, 03/25/2021, 03/18/2022   Influenza Split 04/28/2011, 04/12/2012   Influenza,inj,Quad PF,6+ Mos 04/09/2013, 05/02/2014, 03/11/2015, 05/28/2016, 03/22/2017, 03/27/2018   Moderna Covid-19 Vaccine Bivalent Booster 22yrs & up 04/07/2022   Moderna SARS-COV2 Booster Vaccination 01/03/2021, 05/09/2021   Moderna Sars-Covid-2 Vaccination 08/04/2019, 09/04/2019, 04/25/2020, 11/26/2021   Pneumococcal Conjugate-13 09/30/2014   Pneumococcal Polysaccharide-23 04/28/2011   Td 08/08/2021   Tdap 04/28/2011   Zoster Recombinat (Shingrix) 02/28/2019, 02/28/2019, 06/13/2019   Zoster, Live 08/26/2011    TDAP status: Up to date  Flu Vaccine status: Up to date  Pneumococcal vaccine status: Up to date  Covid-19 vaccine status: Completed vaccines  Qualifies for Shingles Vaccine? Yes   Zostavax completed No   Shingrix Completed?: Yes  Screening Tests Health Maintenance  Topic Date Due   Medicare Annual Wellness (AWV)  05/15/2022   COVID-19 Vaccine (6 - Moderna risk series) 06/02/2022   COLONOSCOPY (Pts 45-98yrs Insurance coverage will need to be confirmed)  06/30/2022   HEMOGLOBIN A1C  08/20/2022    OPHTHALMOLOGY EXAM  11/27/2022   FOOT EXAM  02/24/2023   Diabetic kidney evaluation - Urine ACR  03/06/2023   Diabetic kidney evaluation - GFR measurement  04/09/2023   TETANUS/TDAP  08/09/2031   Pneumonia Vaccine 66+ Years old  Completed   INFLUENZA VACCINE  Completed   DEXA SCAN  Completed   Zoster Vaccines- Shingrix  Completed   HPV VACCINES  Aged Out    Health Maintenance  Health Maintenance Due  Topic Date Due   Medicare Annual Wellness (AWV)  05/15/2022    Colorectal cancer screening: Type of screening: Colonoscopy. Completed 06/30/2017. Repeat every 5 years  Mammogram status: Completed 06/24/2021. Repeat every year Has appointment in January with Dr.Simpson.  Bone Density status: Completed  06/14/2018. Results reflect: Bone density results: OSTEOPENIA.   Lung Cancer Screening: (Low Dose CT Chest recommended if Age 58-80 years, 30 pack-year currently smoking OR have quit w/in 15years.) does not qualify.     Additional Screening:  Hepatitis C Screening: does not qualify  Vision Screening: Recommended annual ophthalmology exams for early detection of glaucoma and other disorders of the eye. Is the patient up to date with their annual eye exam?  Yes  Who is the provider or what is the name of the office in which the patient attends annual eye exams? Dr.Jones If pt is not established with a provider, would they like to be referred to a provider to establish care? No .   Dental Screening: Recommended annual dental exams for proper oral hygiene  Community Resource Referral / Chronic Care Management: CRR required this visit?  No   CCM required this visit?  No      Plan:     I have personally reviewed and noted the following in the patient's chart:   Medical and social history Use of alcohol, tobacco or illicit drugs  Current medications and supplements including opioid prescriptions. Patient is not currently taking opioid prescriptions. Functional ability and  status Nutritional status Physical activity Advanced directives List of other physicians Hospitalizations, surgeries, and ER visits in previous 12 months Vitals Screenings to include cognitive, depression, and falls Referrals and appointments  In addition, I have reviewed and discussed with patient certain preventive protocols, quality metrics, and best practice recommendations. A written personalized care plan for preventive services as well as general preventive health recommendations were provided to patient.     Lorene Dy, MD   04/20/2022

## 2022-04-20 NOTE — Patient Instructions (Signed)
  Renee Collins , Thank you for taking time to come for your Medicare Wellness Visit. I appreciate your ongoing commitment to your health goals. Please review the following plan we discussed and let me know if I can assist you in the future.   These are the goals we discussed:  Goals      Exercise 3x per week (30 min per time)     Would like to get back to the [facility]. She has been helping with granddaughter. Still has this same goal.      Have 3 meals a day     Recommend eating 3 balanced meals a day.        This is a list of the screening recommended for you and due dates:  Health Maintenance  Topic Date Due   Medicare Annual Wellness Visit  05/15/2022   COVID-19 Vaccine (6 - Moderna risk series) 06/02/2022   Colon Cancer Screening  06/30/2022   Hemoglobin A1C  08/20/2022   Eye exam for diabetics  11/27/2022   Complete foot exam   02/24/2023   Yearly kidney health urinalysis for diabetes  03/06/2023   Yearly kidney function blood test for diabetes  04/09/2023   Tetanus Vaccine  08/09/2031   Pneumonia Vaccine  Completed   Flu Shot  Completed   DEXA scan (bone density measurement)  Completed   Zoster (Shingles) Vaccine  Completed   HPV Vaccine  Aged Out

## 2022-05-08 ENCOUNTER — Other Ambulatory Visit: Payer: Self-pay | Admitting: Family Medicine

## 2022-05-11 ENCOUNTER — Other Ambulatory Visit (HOSPITAL_COMMUNITY): Payer: Self-pay | Admitting: Family Medicine

## 2022-05-11 DIAGNOSIS — Z1231 Encounter for screening mammogram for malignant neoplasm of breast: Secondary | ICD-10-CM

## 2022-05-27 ENCOUNTER — Other Ambulatory Visit: Payer: Self-pay | Admitting: Family Medicine

## 2022-05-27 DIAGNOSIS — E041 Nontoxic single thyroid nodule: Secondary | ICD-10-CM

## 2022-05-28 ENCOUNTER — Ambulatory Visit: Payer: Medicare Other | Admitting: Podiatry

## 2022-06-09 ENCOUNTER — Encounter: Payer: Self-pay | Admitting: Podiatry

## 2022-06-09 ENCOUNTER — Ambulatory Visit: Payer: Medicare Other | Admitting: Podiatry

## 2022-06-09 VITALS — BP 137/74 | HR 86

## 2022-06-09 DIAGNOSIS — N1832 Chronic kidney disease, stage 3b: Secondary | ICD-10-CM

## 2022-06-09 DIAGNOSIS — Q828 Other specified congenital malformations of skin: Secondary | ICD-10-CM

## 2022-06-09 DIAGNOSIS — M79674 Pain in right toe(s): Secondary | ICD-10-CM

## 2022-06-09 DIAGNOSIS — E0822 Diabetes mellitus due to underlying condition with diabetic chronic kidney disease: Secondary | ICD-10-CM | POA: Diagnosis not present

## 2022-06-09 DIAGNOSIS — Z794 Long term (current) use of insulin: Secondary | ICD-10-CM

## 2022-06-09 DIAGNOSIS — B351 Tinea unguium: Secondary | ICD-10-CM | POA: Diagnosis not present

## 2022-06-09 DIAGNOSIS — L84 Corns and callosities: Secondary | ICD-10-CM

## 2022-06-09 DIAGNOSIS — M79675 Pain in left toe(s): Secondary | ICD-10-CM

## 2022-06-10 DIAGNOSIS — E211 Secondary hyperparathyroidism, not elsewhere classified: Secondary | ICD-10-CM | POA: Diagnosis not present

## 2022-06-10 DIAGNOSIS — R809 Proteinuria, unspecified: Secondary | ICD-10-CM | POA: Diagnosis not present

## 2022-06-10 DIAGNOSIS — E1122 Type 2 diabetes mellitus with diabetic chronic kidney disease: Secondary | ICD-10-CM | POA: Diagnosis not present

## 2022-06-10 DIAGNOSIS — N189 Chronic kidney disease, unspecified: Secondary | ICD-10-CM | POA: Diagnosis not present

## 2022-06-10 DIAGNOSIS — E1129 Type 2 diabetes mellitus with other diabetic kidney complication: Secondary | ICD-10-CM | POA: Diagnosis not present

## 2022-06-11 ENCOUNTER — Other Ambulatory Visit: Payer: Self-pay | Admitting: Family Medicine

## 2022-06-13 NOTE — Progress Notes (Signed)
  Subjective:  Patient ID: Sarina Ill, female    DOB: 02/12/1941,  MRN: [id number]  SHOLONDA JOBST presents to clinic today for at risk foot care. Pt has h/o NIDDM with chronic kidney disease and callus(es) left foot, porokeratotic lesion(s) left lower extremity, and painful mycotic nails. Painful toenails interfere with ambulation. Aggravating factors include wearing enclosed shoe gear. Pain is relieved with periodic professional debridement. Painful callus(es) and porokeratotic lesion(s) are aggravated when weightbearing with and without shoegear. Pain is relieved with periodic professional debridement.  Chief Complaint  Patient presents with   Diabetes    Diabetic foot care, A1c-6.7 BG not taking, PCP last seen 2 months ago    New problem(s): None.   PCP is [name], MD.  No Known Allergies  Review of Systems: Negative except as noted in the HPI.  Objective: No changes noted in today's physical examination. Vitals:   06/09/22 1054  BP: 137/74  Pulse: 86   JAVAYA OREGON is a pleasant 81 y.o. female in NAD. AAO x 3.  Vascular Capillary refill time to digits immediate b/l. Palpable pedal pulses b/l LE. Pedal hair present. Lower extremity skin temperature gradient within normal limits. No edema noted b/l lower extremities. No cyanosis or clubbing noted.  Neurologic Normal speech. Protective sensation intact 5/5 intact bilaterally with 10g monofilament b/l. Vibratory sensation intact b/l. Proprioception intact bilaterally.  Dermatologic Pedal skin with normal turgor, texture and tone bilaterally. No open wounds bilaterally. No interdigital macerations bilaterally.   Toenails 1-5 b/l elongated, discolored, dystrophic, thickened, crumbly with subungual debris and tenderness to dorsal palpation.   Hyperkeratotic lesion(s) submet head 5 left foot.  No erythema, no edema, no drainage, no fluctuance.   Porokeratotic lesion(s) lateral aspect left foot. No erythema, no edema, no  drainage, no fluctuance.  Orthopedic: Normal muscle strength 5/5 to all lower extremity muscle groups bilaterally. No pain crepitus or joint limitation noted with ROM b/l. No gross bony deformities bilaterally.   Assessment/Plan: 1. Pain due to onychomycosis of toenails of both feet   2. Callus   3. Porokeratosis   4. 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   -Patient was evaluated and treated. All patient's and/or POA's questions/concerns answered on today's visit. -Continue diabetic foot care principles: inspect feet daily, monitor glucose as recommended by PCP and/or Endocrinologist, and follow prescribed diet per PCP, Endocrinologist and/or dietician. -Continue supportive shoe gear daily. -Toenails 1-5 b/l were debrided in length and girth with sterile nail nippers without iatrogenic bleeding. Patient declined use of dremel. -Callus(es) submet head 5 left foot pared utilizing sterile scalpel blade without complication or incident. Total number debrided =1. -Porokeratotic lesion(s) plantarlateral aspect of midfoot left foot pared and enucleated with sterile currette without incident. Total number of lesions debrided=1. -Patient/POA to call should there be question/concern in the interim.   Return in about 3 months (around 09/08/2022).  Marzetta Board, DPM

## 2022-06-16 ENCOUNTER — Encounter (INDEPENDENT_AMBULATORY_CARE_PROVIDER_SITE_OTHER): Payer: Self-pay | Admitting: *Deleted

## 2022-06-29 ENCOUNTER — Other Ambulatory Visit: Payer: Self-pay

## 2022-06-29 MED ORDER — OMEPRAZOLE 20 MG PO CPDR: 20.0000 mg | DELAYED_RELEASE_CAPSULE | Freq: Every day | ORAL | 1 refills | Status: DC | PRN

## 2022-06-30 ENCOUNTER — Ambulatory Visit (HOSPITAL_COMMUNITY)
Admission: RE | Admit: 2022-06-30 | Discharge: 2022-06-30 | Disposition: A | Discharge status: Home / Self Care | Payer: No Typology Code available for payment source | Source: Ambulatory Visit | Attending: Family Medicine | Admitting: Family Medicine

## 2022-06-30 DIAGNOSIS — Z1231 Encounter for screening mammogram for malignant neoplasm of breast: Secondary | ICD-10-CM | POA: Insufficient documentation

## 2022-07-02 ENCOUNTER — Other Ambulatory Visit: Payer: Self-pay | Admitting: Family Medicine

## 2022-07-12 DIAGNOSIS — F4320 Adjustment disorder, unspecified: Secondary | ICD-10-CM | POA: Diagnosis not present

## 2022-07-12 DIAGNOSIS — E211 Secondary hyperparathyroidism, not elsewhere classified: Secondary | ICD-10-CM | POA: Diagnosis not present

## 2022-07-12 DIAGNOSIS — D472 Monoclonal gammopathy: Secondary | ICD-10-CM | POA: Diagnosis not present

## 2022-07-12 DIAGNOSIS — R809 Proteinuria, unspecified: Secondary | ICD-10-CM | POA: Diagnosis not present

## 2022-07-12 DIAGNOSIS — D638 Anemia in other chronic diseases classified elsewhere: Secondary | ICD-10-CM | POA: Diagnosis not present

## 2022-07-12 DIAGNOSIS — E1122 Type 2 diabetes mellitus with diabetic chronic kidney disease: Secondary | ICD-10-CM | POA: Diagnosis not present

## 2022-07-12 DIAGNOSIS — E1129 Type 2 diabetes mellitus with other diabetic kidney complication: Secondary | ICD-10-CM | POA: Diagnosis not present

## 2022-07-12 DIAGNOSIS — I129 Hypertensive chronic kidney disease with stage 1 through stage 4 chronic kidney disease, or unspecified chronic kidney disease: Secondary | ICD-10-CM | POA: Diagnosis not present

## 2022-07-12 DIAGNOSIS — N189 Chronic kidney disease, unspecified: Secondary | ICD-10-CM | POA: Diagnosis not present

## 2022-07-13 ENCOUNTER — Ambulatory Visit (INDEPENDENT_AMBULATORY_CARE_PROVIDER_SITE_OTHER): Payer: No Typology Code available for payment source | Admitting: Family Medicine

## 2022-07-13 ENCOUNTER — Encounter: Payer: Self-pay | Admitting: Family Medicine

## 2022-07-13 VITALS — BP 121/71 | HR 98 | Ht 65.0 in | Wt 190.0 lb

## 2022-07-13 DIAGNOSIS — E039 Hypothyroidism, unspecified: Secondary | ICD-10-CM | POA: Diagnosis not present

## 2022-07-13 DIAGNOSIS — Z0000 Encounter for general adult medical examination without abnormal findings: Secondary | ICD-10-CM | POA: Diagnosis not present

## 2022-07-13 DIAGNOSIS — E785 Hyperlipidemia, unspecified: Secondary | ICD-10-CM | POA: Diagnosis not present

## 2022-07-13 DIAGNOSIS — I10 Essential (primary) hypertension: Secondary | ICD-10-CM | POA: Diagnosis not present

## 2022-07-13 DIAGNOSIS — E1149 Type 2 diabetes mellitus with other diabetic neurological complication: Secondary | ICD-10-CM

## 2022-07-13 NOTE — Patient Instructions (Addendum)
F/U in 4 months`, call if you need me sooner  My sincere condolence and prayers for you  all  Lipid, hepatic, hepatic panel, TSH, hBA1C today after 1 pm  Please follow through with colonoscopy  It is important that you exercise regularly at least 30 minutes 5 times a week. If you develop chest pain, have severe difficulty breathing, or feel very tired, stop exercising immediately and seek medical attention    Thanks for choosing [facility], we consider it a privelige to serve you.

## 2022-07-13 NOTE — Progress Notes (Signed)
Renee Collins     MRN: [id number]      DOB: 07-Feb-1941  HPI: Patient is in for annual physical exam. No other health concerns are expressed or addressed at the visit. Grieving passing of her grand daughter 1 day ago  Immunization is reviewed , and  updated if needed.   PE: BP 121/71   Pulse 98   Ht '5\' 5"'$  (1.651 m)   Wt 190 lb (86.2 kg)   SpO2 96%   BMI 31.62 kg/m   Pleasant  female, alert and oriented x 3, in no cardio-pulmonary distress. Afebrile. HEENT No facial trauma or asymetry. Sinuses non tender.  Extra occullar muscles intact.. External ears normal, . Neck: supple, no adenopathy,JVD or thyromegaly.No bruits.  Chest: Clear to ascultation bilaterally.No crackles or wheezes. Non tender to palpation    Cardiovascular system; Heart sounds normal,  S1 and  S2 ,no S3.  No murmur, or thrill. Apical beat not displaced Peripheral pulses normal.  Abdomen: Soft, non tender,     Musculoskeletal exam: Full ROM of spine, hips , shoulders and knees. No deformity ,swelling or crepitus noted. No muscle wasting or atrophy.   Neurologic: Cranial nerves 2 to 12 intact. Power, tone ,sensation and reflexes normal throughout. No disturbance in gait. No tremor.  Skin: Intact, no ulceration, erythema , scaling or rash noted. Pigmentation normal throughout  Psych; Normal mood and affect. Judgement and concentration normal   Assessment & Plan:  Encounter for annual physical exam Annual exam as documented. Immunization and cancer screening needs are specifically addressed at this visit.   Essential hypertension Controlled, no change in medication DASH diet and commitment to daily physical activity for a minimum of 30 minutes discussed and encouraged, as a part of hypertension management. The importance of attaining a healthy weight is also discussed.     07/13/2022   11:15 AM 06/09/2022   10:54 AM 04/20/2022    9:05 AM 02/23/2022    9:21 AM 11/25/2021     3:12 PM 10/07/2021   11:18 AM 09/15/2021   11:30 AM  BP/Weight  Systolic BP 850 277 412 878 [phone]  Diastolic BP 71 74 75 74 80 76 69  Wt. (Lbs) 190  192.4 191 194.12 193.56   BMI 31.62 kg/m2  31.53 kg/m2 30.83 kg/m2 31.33 kg/m2 32.21 kg/m2        Type 2 diabetes mellitus ([place]) Renee Collins is reminded of the importance of commitment to daily physical activity for 30 minutes or more, as able and the need to limit carbohydrate intake to 30 to 60 grams per meal to help with blood sugar control.   The need to take medication as prescribed, test blood sugar as directed, and to call between visits if there is a concern that blood sugar is uncontrolled is also discussed.   Renee Collins is reminded of the importance of daily foot exam, annual eye examination, and good blood sugar, blood pressure and cholesterol control.     Latest Ref Rng & Units 04/08/2022    9:54 AM 02/23/2022   10:48 AM 02/17/2022    9:25 AM 09/30/2021   10:53 AM 09/11/2021    9:11 AM  Diabetic Labs  HbA1c 4.8 - 5.6 %   6.7     Micro/Creat Ratio 0 - 29 mg/g creat  65      Chol 100 - 199 mg/dL   152     HDL >39 mg/dL   63  Calc LDL 0 - 99 mg/dL   75     Triglycerides 0 - 149 mg/dL   73     Creatinine 0.44 - 1.00 mg/dL 1.71   1.76  2.00  1.64       07/13/2022   11:15 AM 06/09/2022   10:54 AM 04/20/2022    9:05 AM 02/23/2022    9:21 AM 11/25/2021    3:12 PM 10/07/2021   11:18 AM 09/15/2021   11:30 AM  BP/Weight  Systolic BP 588 [phone] 209  Diastolic BP 71 74 75 74 80 76 69  Wt. (Lbs) 190  192.4 191 194.12 193.56   BMI 31.62 kg/m2  31.53 kg/m2 30.83 kg/m2 31.33 kg/m2 32.21 kg/m2       Latest Ref Rng & Units 02/23/2022    9:20 AM 11/26/2021   12:00 AM  Foot/eye exam completion dates  Eye Exam No Retinopathy  No Retinopathy      Foot Form Completion  Done      This result is from an external source.      Updated lab needed.   Hypothyroid Updated lab needed .

## 2022-07-14 ENCOUNTER — Encounter: Payer: Self-pay | Admitting: Family Medicine

## 2022-07-14 DIAGNOSIS — Z0000 Encounter for general adult medical examination without abnormal findings: Secondary | ICD-10-CM | POA: Insufficient documentation

## 2022-07-14 LAB — HEPATIC FUNCTION PANEL
ALT: 16 IU/L (ref 0–32)
AST: 24 IU/L (ref 0–40)
Albumin: 4.4 g/dL (ref 3.7–4.7)
Alkaline Phosphatase: 80 IU/L (ref 44–121)
Bilirubin Total: 0.4 mg/dL (ref 0.0–1.2)
Bilirubin, Direct: 0.13 mg/dL (ref 0.00–0.40)
Total Protein: 8.6 g/dL — ABNORMAL HIGH (ref 6.0–8.5)

## 2022-07-14 LAB — LIPID PANEL
Chol/HDL Ratio: 2.5 ratio (ref 0.0–4.4)
Cholesterol, Total: 155 mg/dL (ref 100–199)
HDL: 63 mg/dL (ref >39)
LDL Chol Calc (NIH): 75 mg/dL (ref 0–99)
Triglycerides: 90 mg/dL (ref 0–149)
VLDL Cholesterol Cal: 17 mg/dL (ref 5–40)

## 2022-07-14 LAB — HEMOGLOBIN A1C
Est. average glucose Bld gHb Est-mCnc: 140 mg/dL
Hgb A1c MFr Bld: 6.5 % — ABNORMAL HIGH (ref 4.8–5.6)

## 2022-07-14 LAB — TSH: TSH: 2.42 u[IU]/mL (ref 0.450–4.500)

## 2022-07-14 NOTE — Assessment & Plan Note (Signed)
Annual exam as documented. . Immunization and cancer screening needs are specifically addressed at this visit.  

## 2022-07-14 NOTE — Assessment & Plan Note (Signed)
Controlled, no change in medication DASH diet and commitment to daily physical activity for a minimum of 30 minutes discussed and encouraged, as a part of hypertension management. The importance of attaining a healthy weight is also discussed.     07/13/2022   11:15 AM 06/09/2022   10:54 AM 04/20/2022    9:05 AM 02/23/2022    9:21 AM 11/25/2021    3:12 PM 10/07/2021   11:18 AM 09/15/2021   11:30 AM  BP/Weight  Systolic BP 532 023 343 568 616 837 290  Diastolic BP 71 74 75 74 80 76 69  Wt. (Lbs) 190  192.4 191 194.12 193.56   BMI 31.62 kg/m2  31.53 kg/m2 30.83 kg/m2 31.33 kg/m2 32.21 kg/m2

## 2022-07-14 NOTE — Assessment & Plan Note (Signed)
Updated lab needed.  

## 2022-07-14 NOTE — Assessment & Plan Note (Signed)
Renee Collins is reminded of the importance of commitment to daily physical activity for 30 minutes or more, as able and the need to limit carbohydrate intake to 30 to 60 grams per meal to help with blood sugar control.   The need to take medication as prescribed, test blood sugar as directed, and to call between visits if there is a concern that blood sugar is uncontrolled is also discussed.   Renee Collins is reminded of the importance of daily foot exam, annual eye examination, and good blood sugar, blood pressure and cholesterol control.     Latest Ref Rng & Units 04/08/2022    9:54 AM 02/23/2022   10:48 AM 02/17/2022    9:25 AM 09/30/2021   10:53 AM 09/11/2021    9:11 AM  Diabetic Labs  HbA1c 4.8 - 5.6 %   6.7     Micro/Creat Ratio 0 - 29 mg/g creat  65      Chol 100 - 199 mg/dL   152     HDL >39 mg/dL   63     Calc LDL 0 - 99 mg/dL   75     Triglycerides 0 - 149 mg/dL   73     Creatinine 0.44 - 1.00 mg/dL 1.71   1.76  2.00  1.64       07/13/2022   11:15 AM 06/09/2022   10:54 AM 04/20/2022    9:05 AM 02/23/2022    9:21 AM 11/25/2021    3:12 PM 10/07/2021   11:18 AM 09/15/2021   11:30 AM  BP/Weight  Systolic BP 149 702 637 858 850 277 412  Diastolic BP 71 74 75 74 80 76 69  Wt. (Lbs) 190  192.4 191 194.12 193.56   BMI 31.62 kg/m2  31.53 kg/m2 30.83 kg/m2 31.33 kg/m2 32.21 kg/m2       Latest Ref Rng & Units 02/23/2022    9:20 AM 11/26/2021   12:00 AM  Foot/eye exam completion dates  Eye Exam No Retinopathy  No Retinopathy      Foot Form Completion  Done      This result is from an external source.      Updated lab needed.

## 2022-07-31 ENCOUNTER — Encounter: Payer: Self-pay | Admitting: Family Medicine

## 2022-08-20 ENCOUNTER — Other Ambulatory Visit: Payer: Self-pay

## 2022-08-20 DIAGNOSIS — E041 Nontoxic single thyroid nodule: Secondary | ICD-10-CM

## 2022-08-20 MED ORDER — LEVOTHYROXINE SODIUM 88 MCG PO TABS: ORAL_TABLET | ORAL | 2 refills | Status: DC

## 2022-09-10 DIAGNOSIS — R809 Proteinuria, unspecified: Secondary | ICD-10-CM | POA: Diagnosis not present

## 2022-09-10 DIAGNOSIS — D472 Monoclonal gammopathy: Secondary | ICD-10-CM | POA: Diagnosis not present

## 2022-09-10 DIAGNOSIS — E1129 Type 2 diabetes mellitus with other diabetic kidney complication: Secondary | ICD-10-CM | POA: Diagnosis not present

## 2022-09-10 DIAGNOSIS — E211 Secondary hyperparathyroidism, not elsewhere classified: Secondary | ICD-10-CM | POA: Diagnosis not present

## 2022-09-10 DIAGNOSIS — I129 Hypertensive chronic kidney disease with stage 1 through stage 4 chronic kidney disease, or unspecified chronic kidney disease: Secondary | ICD-10-CM | POA: Diagnosis not present

## 2022-09-10 DIAGNOSIS — E1122 Type 2 diabetes mellitus with diabetic chronic kidney disease: Secondary | ICD-10-CM | POA: Diagnosis not present

## 2022-09-10 DIAGNOSIS — N189 Chronic kidney disease, unspecified: Secondary | ICD-10-CM | POA: Diagnosis not present

## 2022-09-10 DIAGNOSIS — D638 Anemia in other chronic diseases classified elsewhere: Secondary | ICD-10-CM | POA: Diagnosis not present

## 2022-09-17 DIAGNOSIS — N189 Chronic kidney disease, unspecified: Secondary | ICD-10-CM | POA: Diagnosis not present

## 2022-09-17 DIAGNOSIS — E211 Secondary hyperparathyroidism, not elsewhere classified: Secondary | ICD-10-CM | POA: Diagnosis not present

## 2022-09-17 DIAGNOSIS — D472 Monoclonal gammopathy: Secondary | ICD-10-CM | POA: Diagnosis not present

## 2022-09-17 DIAGNOSIS — I129 Hypertensive chronic kidney disease with stage 1 through stage 4 chronic kidney disease, or unspecified chronic kidney disease: Secondary | ICD-10-CM | POA: Diagnosis not present

## 2022-09-17 DIAGNOSIS — E1122 Type 2 diabetes mellitus with diabetic chronic kidney disease: Secondary | ICD-10-CM | POA: Diagnosis not present

## 2022-09-17 DIAGNOSIS — E1129 Type 2 diabetes mellitus with other diabetic kidney complication: Secondary | ICD-10-CM | POA: Diagnosis not present

## 2022-09-17 DIAGNOSIS — R809 Proteinuria, unspecified: Secondary | ICD-10-CM | POA: Diagnosis not present

## 2022-09-17 DIAGNOSIS — D638 Anemia in other chronic diseases classified elsewhere: Secondary | ICD-10-CM | POA: Diagnosis not present

## 2022-09-24 IMAGING — DX DG CERVICAL SPINE COMPLETE 4+V
7 series · 7 of 7 positions shown · non-contrast
Comparison: 09/04/2015

CLINICAL DATA: arthritis in c spine and rigth arm pain worsening x
3 months

EXAM:
CERVICAL SPINE - COMPLETE 4+ VIEW

[c-spine lat]
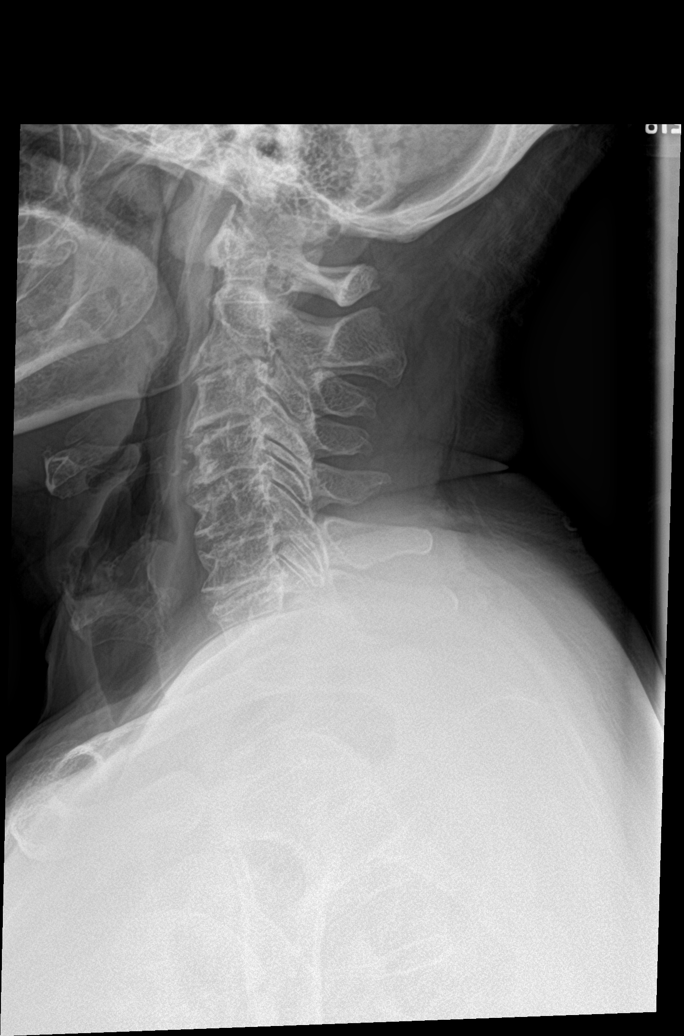

[c-spine obl (1 of 2)]
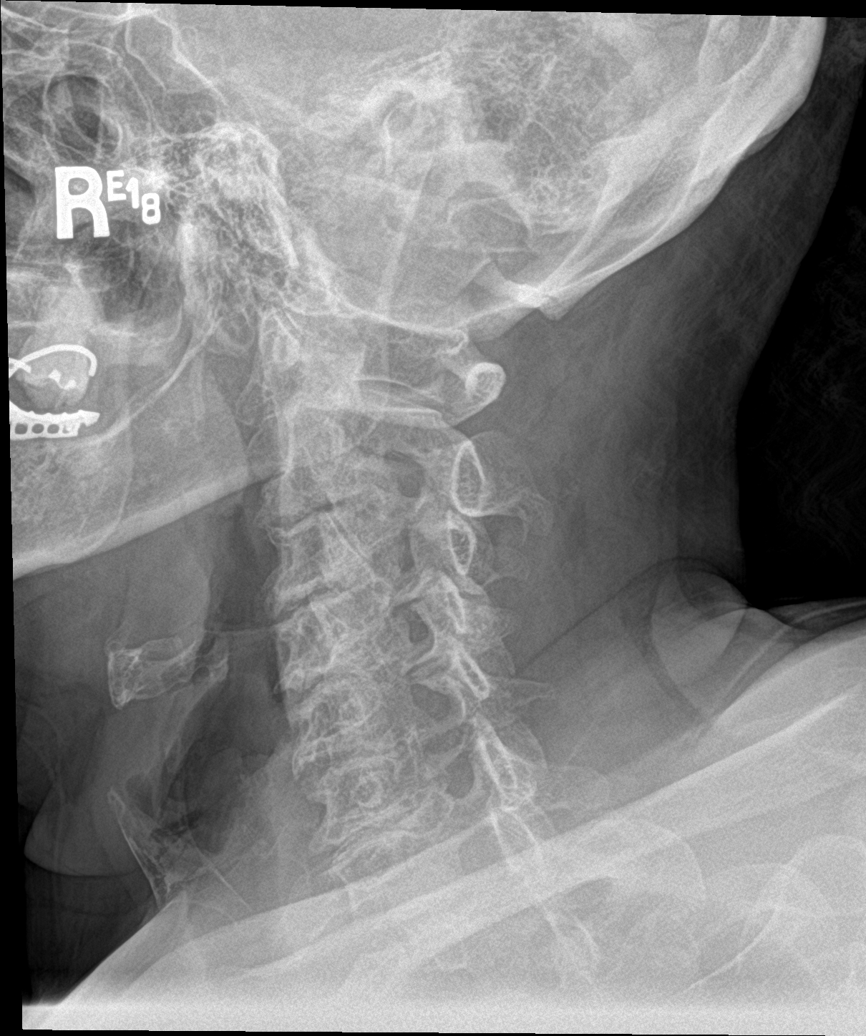

[c-spine obl (2 of 2)]
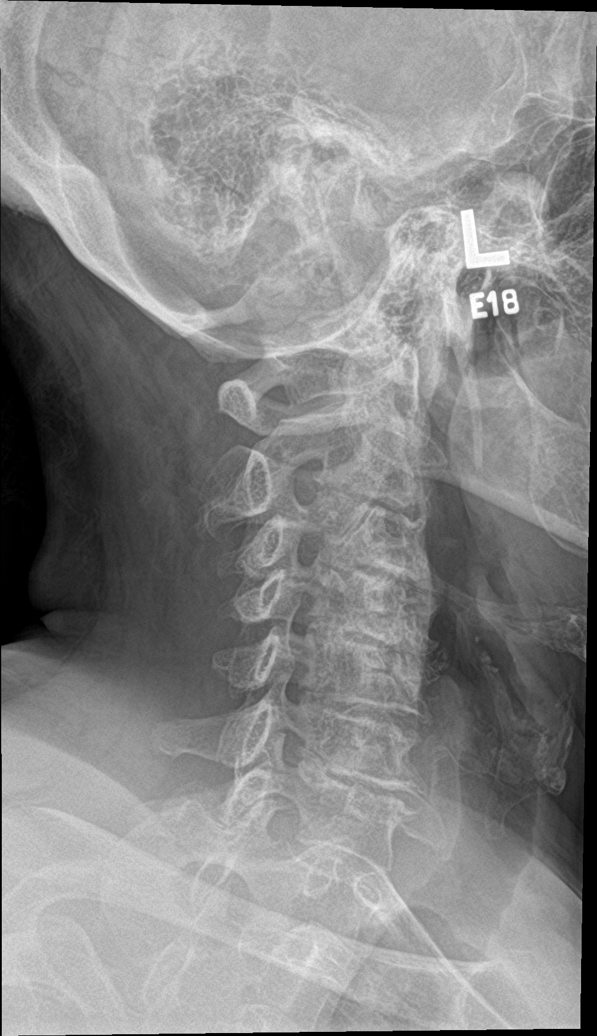

[c-spine ap]
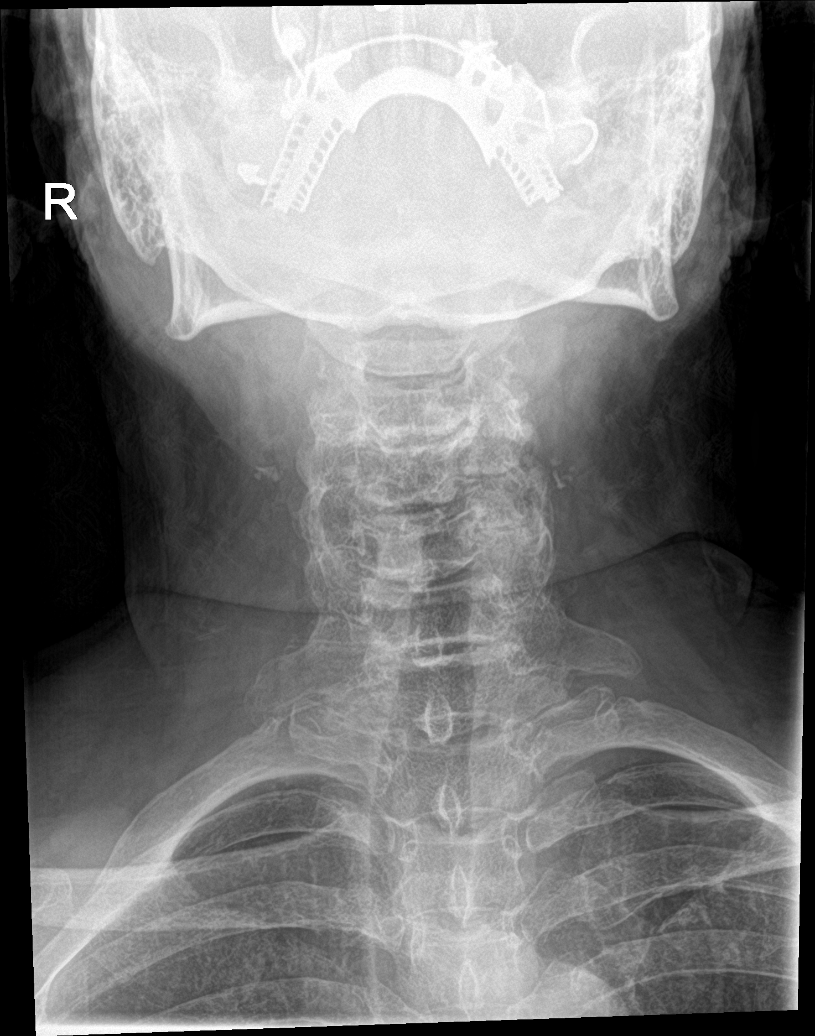

[c-spine open mouth (1 of 2)]
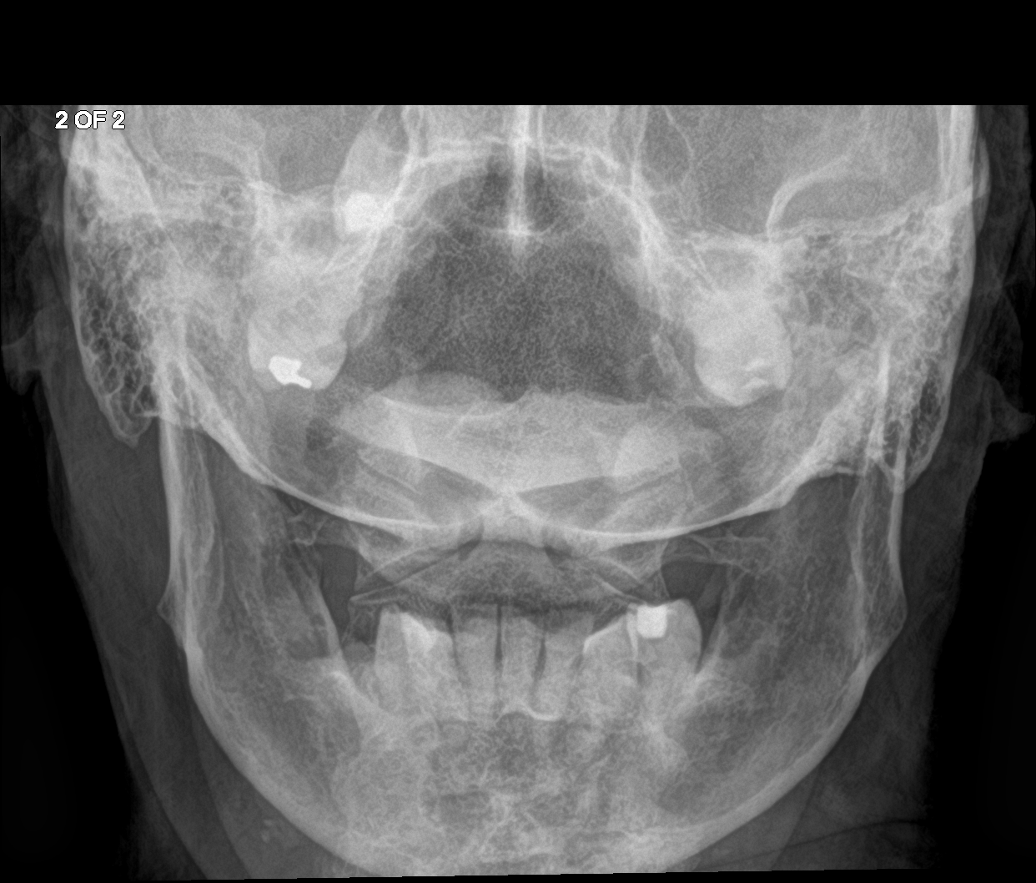

[c-spine swimmers trauma]
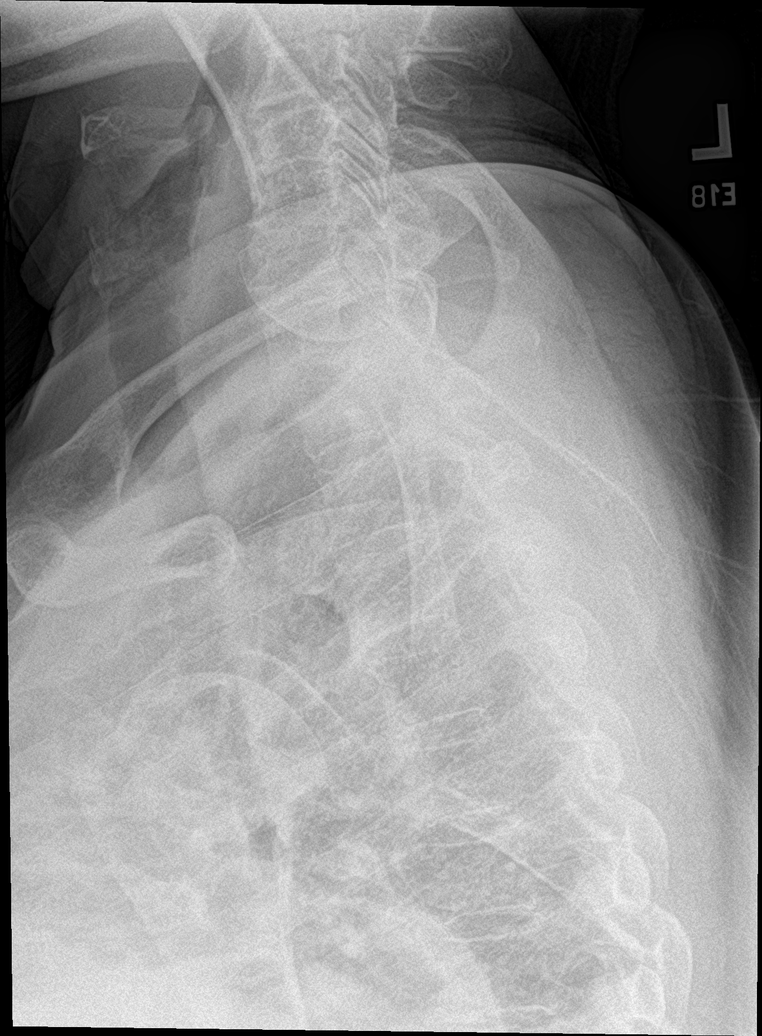

[c-spine open mouth (2 of 2)]
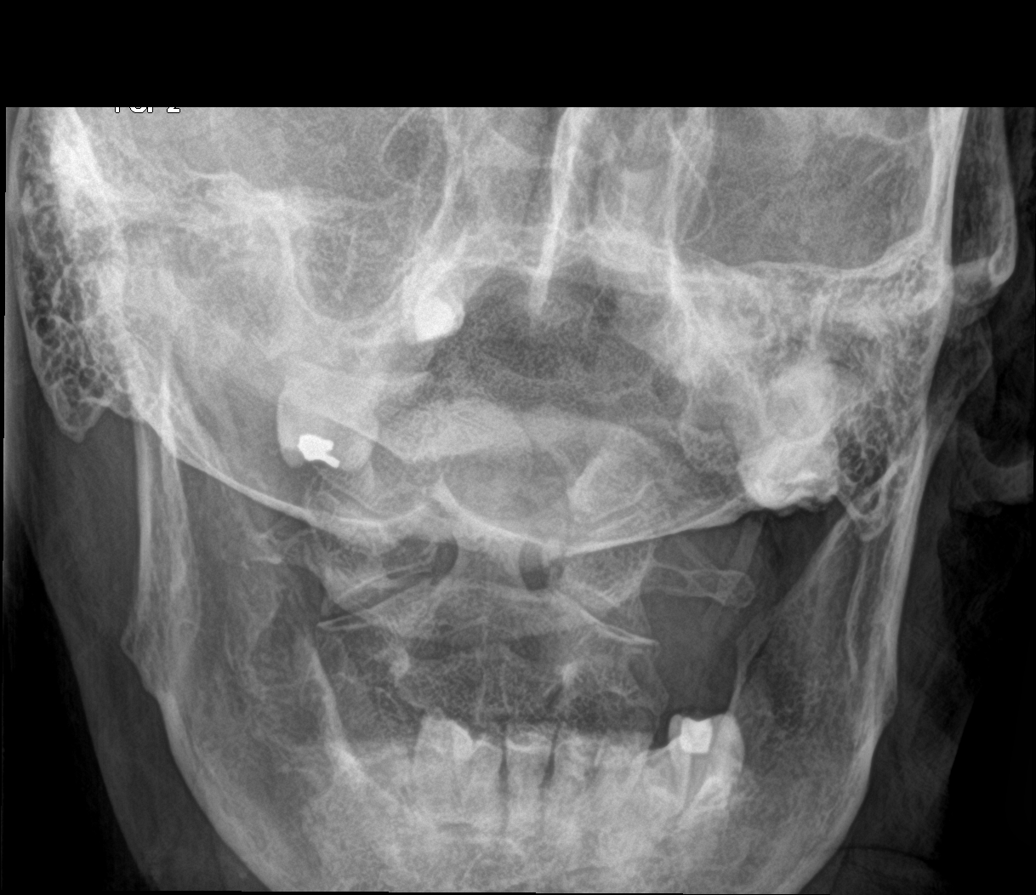

[7 of 7 positions shown; findings below may reference images not displayed]

FINDINGS: Advanced diffuse degenerative disc disease with disc space narrowing
and spurring. Advanced diffuse degenerative facet disease
bilaterally. Moderate multilevel neural foraminal narrowing on the
right from C4-5 through C6-7. Moderate to severe left neural
foraminal narrowing at C3-4 and C4-5 with mild left neural foraminal
narrowing at C5-6. No fracture or subluxation. Prevertebral soft
tissues are normal.
IMPRESSION: Diffuse advanced degenerative disc disease and facet disease as
above. No acute bony abnormality.

## 2022-09-25 IMAGING — US US EXTREM UP *R* LTD
1 series · 6 of 6 positions shown · non-contrast
Comparison: None.

CLINICAL DATA: Possible mass

EXAM:
ULTRASOUND RIGHT UPPER EXTREMITY LIMITED
TECHNIQUE: Ultrasound examination of the upper extremity soft tissues was
performed in the area of clinical concern.

[Series 1: us extrem up *right* ltd · 0.06mm/px · 6 acquisitions, 6 frames shown]
[im 1/6]
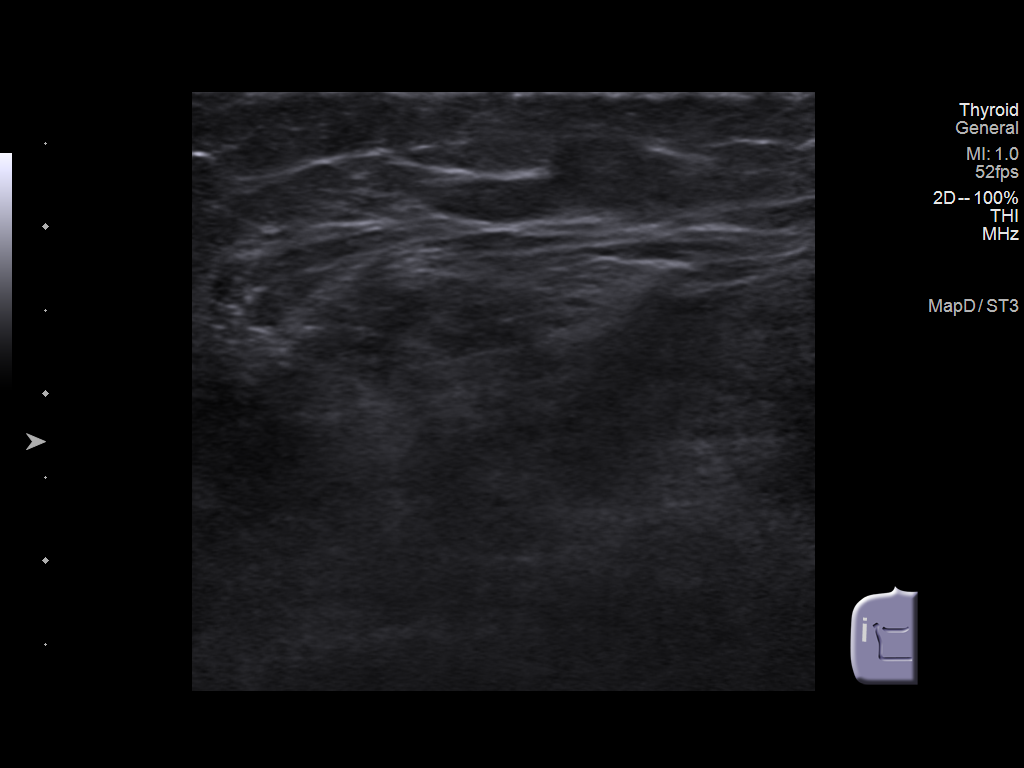
[im 2/6]
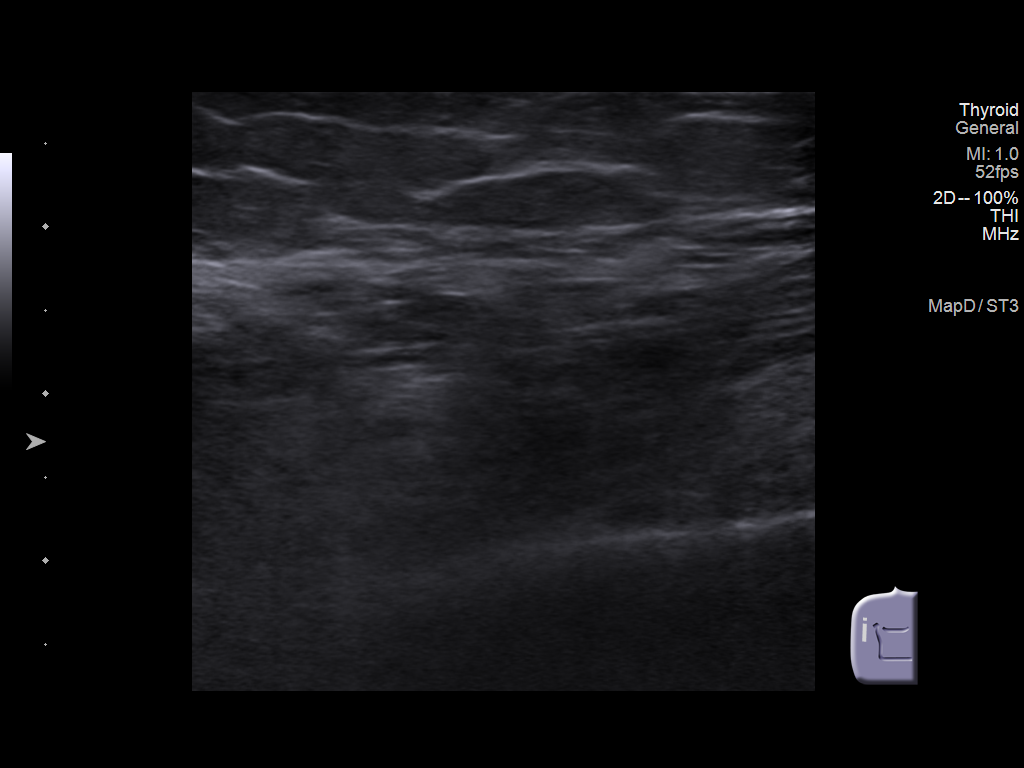
[im 3/6]
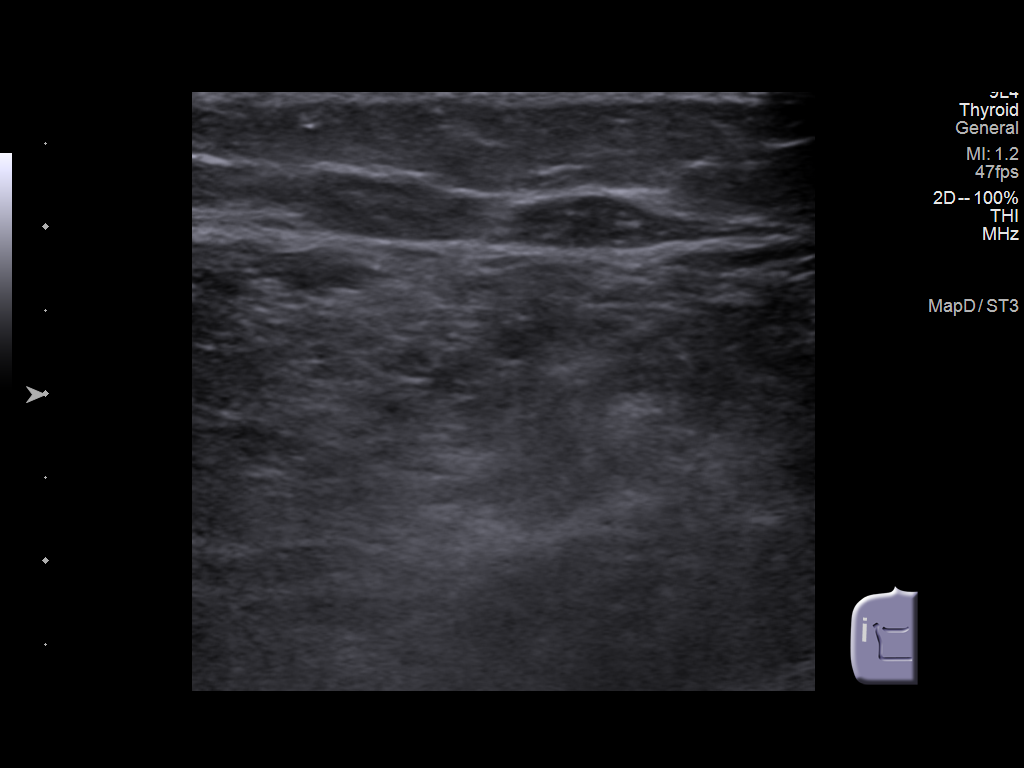
[im 4/6]
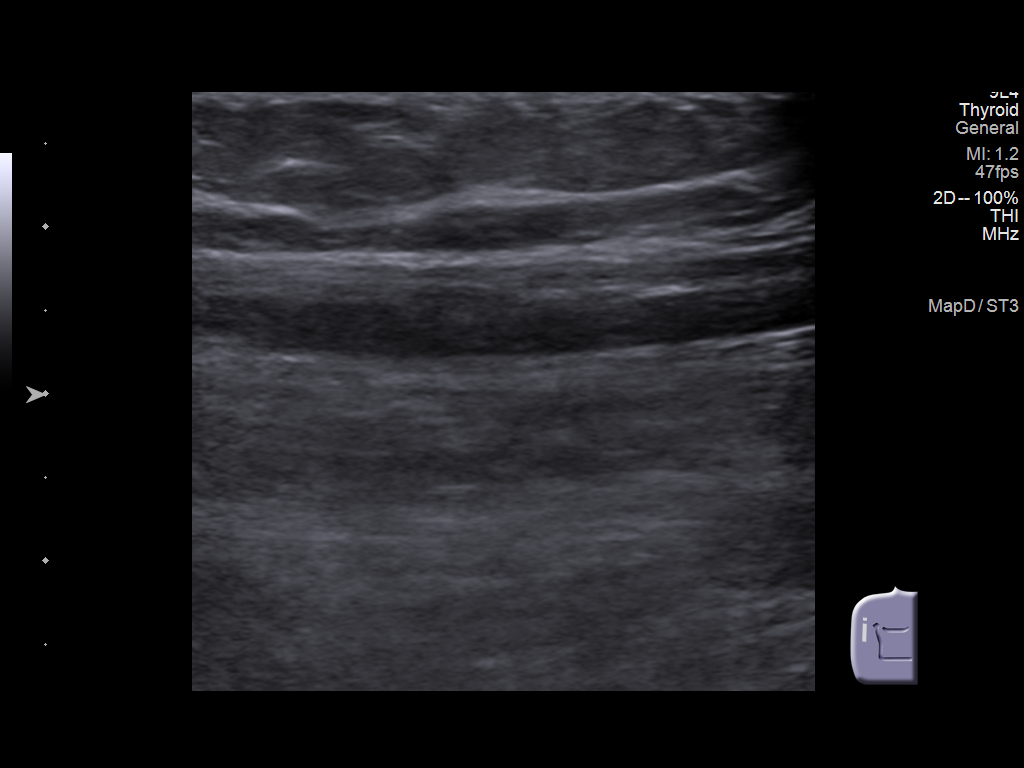
[im 5/6]
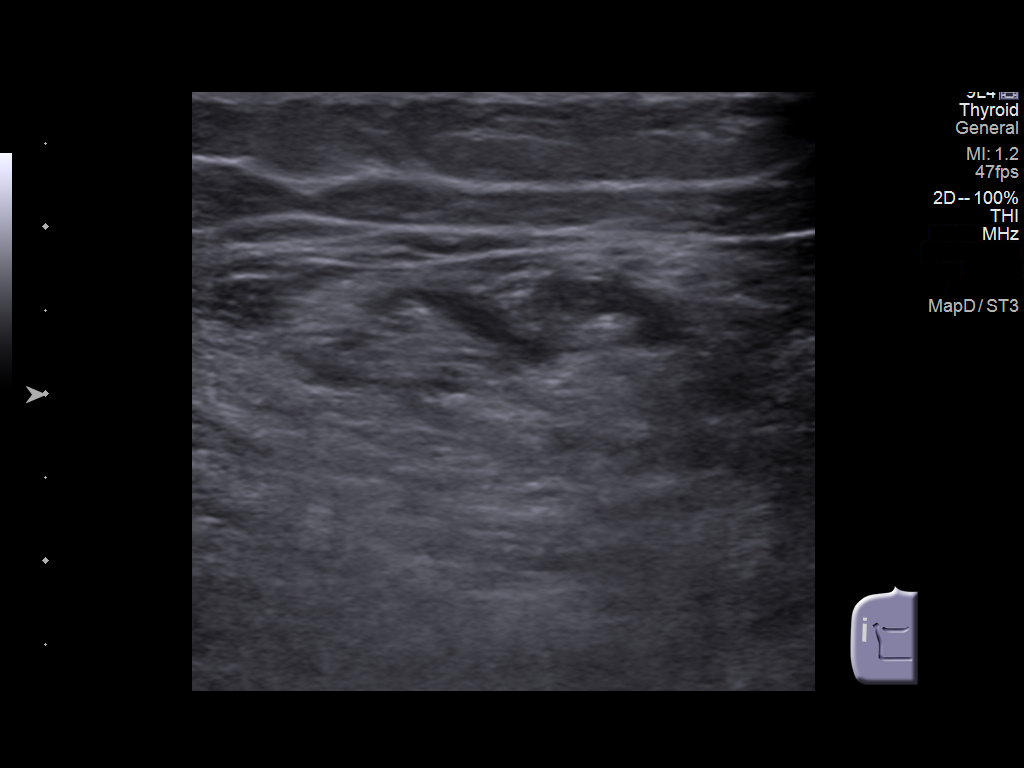
[im 6/6]
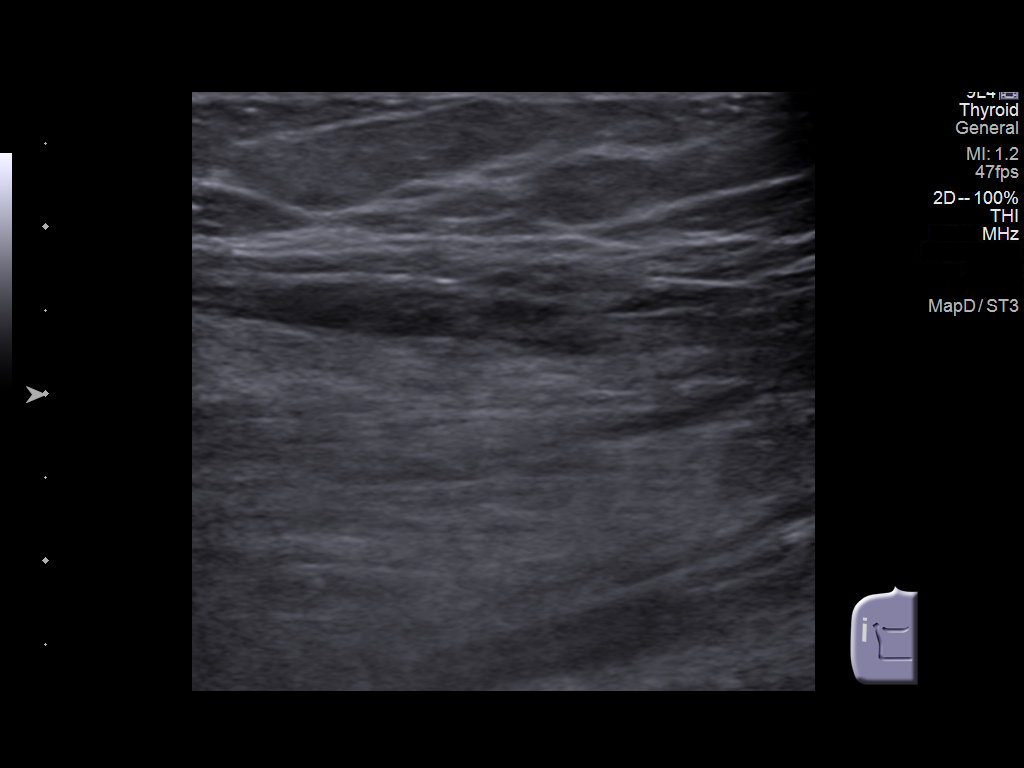

[6 of 6 positions shown; findings below may reference images not displayed]

FINDINGS: Ultrasound was performed in the area concern in the right lateral
upper arm. No abnormality visualized.
IMPRESSION: Unremarkable study.

## 2022-09-29 ENCOUNTER — Ambulatory Visit: Payer: No Typology Code available for payment source | Admitting: Podiatry

## 2022-09-29 ENCOUNTER — Encounter: Payer: Self-pay | Admitting: Podiatry

## 2022-09-29 VITALS — BP 133/78

## 2022-09-29 DIAGNOSIS — M79675 Pain in left toe(s): Secondary | ICD-10-CM | POA: Diagnosis not present

## 2022-09-29 DIAGNOSIS — Q828 Other specified congenital malformations of skin: Secondary | ICD-10-CM | POA: Diagnosis not present

## 2022-09-29 DIAGNOSIS — L84 Corns and callosities: Secondary | ICD-10-CM | POA: Diagnosis not present

## 2022-09-29 DIAGNOSIS — N1832 Chronic kidney disease, stage 3b: Secondary | ICD-10-CM

## 2022-09-29 DIAGNOSIS — E0822 Diabetes mellitus due to underlying condition with diabetic chronic kidney disease: Secondary | ICD-10-CM | POA: Diagnosis not present

## 2022-09-29 DIAGNOSIS — Z794 Long term (current) use of insulin: Secondary | ICD-10-CM | POA: Diagnosis not present

## 2022-09-29 DIAGNOSIS — M79674 Pain in right toe(s): Secondary | ICD-10-CM

## 2022-09-29 DIAGNOSIS — B351 Tinea unguium: Secondary | ICD-10-CM | POA: Diagnosis not present

## 2022-09-29 NOTE — Progress Notes (Signed)
  Subjective:  Patient ID: Renee Collins, female    DOB: Jan 25, 1941,  MRN: [id number]  Renee Collins presents to clinic today for:  Chief Complaint  Patient presents with   Nail Problem    [facility] BS-do not check A1C-6.7 Verlene Mayer PCP VST-05/2023   New problem(s): None.   PCP is [name], MD.  No Known Allergies  Review of Systems: Negative except as noted in the HPI.  Objective: No changes noted in today's physical examination. Vitals:   09/29/22 1013  BP: 133/78   Renee Collins is a pleasant 82 y.o. female WD, WN in NAD. AAO x 3. Vascular Capillary refill time to digits immediate b/l. Palpable pedal pulses b/l LE. Pedal hair present. Lower extremity skin temperature gradient within normal limits. No edema noted b/l lower extremities. No cyanosis or clubbing noted.  Neurologic Normal speech. Protective sensation intact 5/5 intact bilaterally with 10g monofilament b/l. Vibratory sensation intact b/l. Proprioception intact bilaterally.  Dermatologic Pedal skin with normal turgor, texture and tone bilaterally. No open wounds bilaterally. No interdigital macerations bilaterally.   Toenails 1-5 b/l elongated, discolored, dystrophic, thickened, crumbly with subungual debris and tenderness to dorsal palpation.   Hyperkeratotic lesion(s) submet head 5 left foot.  No erythema, no edema, no drainage, no fluctuance.   Porokeratotic lesion(s) lateral aspect left foot. No erythema, no edema, no drainage, no fluctuance.  Orthopedic: Normal muscle strength 5/5 to all lower extremity muscle groups bilaterally. No pain crepitus or joint limitation noted with ROM b/l. No gross bony deformities bilaterally.   Assessment/Plan: 1. Pain due to onychomycosis of toenails of both feet   2. Callus   3. Porokeratosis   4. 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 -Continue supportive shoe gear daily. -Mycotic toenails 1-5 bilaterally were debrided in length and girth with sterile nail nippers and dremel without incident. -Callus(es) submet head 5 left foot pared utilizing sharp debridement with sterile blade without complication or incident. Total number debrided =1. -Porokeratotic lesion(s) left foot pared and enucleated with sterile currette without incident. Total number of lesions debrided=1. -Patient/POA to call should there be question/concern in the interim.   Return in about 3 months (around 12/29/2022).  Marzetta Board, DPM

## 2022-10-08 ENCOUNTER — Other Ambulatory Visit: Payer: Self-pay

## 2022-10-08 ENCOUNTER — Encounter: Payer: Self-pay | Admitting: Family Medicine

## 2022-10-08 DIAGNOSIS — F324 Major depressive disorder, single episode, in partial remission: Secondary | ICD-10-CM

## 2022-10-08 MED ORDER — BUPROPION HCL ER (XL) 150 MG PO TB24: 150.0000 mg | ORAL_TABLET | Freq: Every day | ORAL | 3 refills | Status: DC

## 2022-10-12 NOTE — Addendum Note (Signed)
Addended by: Geradine Girt B on: 10/12/2022 07:43 AM   Modules accepted: Orders

## 2022-10-19 ENCOUNTER — Inpatient Hospital Stay: Payer: No Typology Code available for payment source | Attending: Physician Assistant

## 2022-10-19 DIAGNOSIS — E1122 Type 2 diabetes mellitus with diabetic chronic kidney disease: Secondary | ICD-10-CM | POA: Insufficient documentation

## 2022-10-19 DIAGNOSIS — N184 Chronic kidney disease, stage 4 (severe): Secondary | ICD-10-CM | POA: Diagnosis not present

## 2022-10-19 DIAGNOSIS — D631 Anemia in chronic kidney disease: Secondary | ICD-10-CM | POA: Insufficient documentation

## 2022-10-19 DIAGNOSIS — D472 Monoclonal gammopathy: Secondary | ICD-10-CM | POA: Diagnosis not present

## 2022-10-19 DIAGNOSIS — D509 Iron deficiency anemia, unspecified: Secondary | ICD-10-CM | POA: Diagnosis not present

## 2022-10-19 DIAGNOSIS — I129 Hypertensive chronic kidney disease with stage 1 through stage 4 chronic kidney disease, or unspecified chronic kidney disease: Secondary | ICD-10-CM | POA: Insufficient documentation

## 2022-10-19 DIAGNOSIS — Z803 Family history of malignant neoplasm of breast: Secondary | ICD-10-CM | POA: Diagnosis not present

## 2022-10-19 LAB — CBC WITH DIFFERENTIAL/PLATELET
Abs Immature Granulocytes: 0.02 10*3/uL (ref 0.00–0.07)
Basophils Absolute: 0 10*3/uL (ref 0.0–0.1)
Basophils Relative: 1 %
Eosinophils Absolute: 0.4 10*3/uL (ref 0.0–0.5)
Eosinophils Relative: 7 %
HCT: 35.8 % — ABNORMAL LOW (ref 36.0–46.0)
Hemoglobin: 11.1 g/dL — ABNORMAL LOW (ref 12.0–15.0)
Immature Granulocytes: 0 %
Lymphocytes Relative: 41 %
Lymphs Abs: 2.3 10*3/uL (ref 0.7–4.0)
MCH: 26.7 pg (ref 26.0–34.0)
MCHC: 31 g/dL (ref 30.0–36.0)
MCV: 86.3 fL (ref 80.0–100.0)
Monocytes Absolute: 0.5 10*3/uL (ref 0.1–1.0)
Monocytes Relative: 8 %
Neutro Abs: 2.4 10*3/uL (ref 1.7–7.7)
Neutrophils Relative %: 43 %
Platelets: 227 10*3/uL (ref 150–400)
RBC: 4.15 MIL/uL (ref 3.87–5.11)
RDW: 13.6 % (ref 11.5–15.5)
WBC: 5.6 10*3/uL (ref 4.0–10.5)
nRBC: 0 % (ref 0.0–0.2)

## 2022-10-19 LAB — COMPREHENSIVE METABOLIC PANEL
ALT: 23 U/L (ref 0–44)
AST: 24 U/L (ref 15–41)
Albumin: 3.8 g/dL (ref 3.5–5.0)
Alkaline Phosphatase: 72 U/L (ref 38–126)
Anion gap: 9 (ref 5–15)
BUN: 28 mg/dL — ABNORMAL HIGH (ref 8–23)
CO2: 23 mmol/L (ref 22–32)
Calcium: 9.6 mg/dL (ref 8.9–10.3)
Chloride: 106 mmol/L (ref 98–111)
Creatinine, Ser: 1.84 mg/dL — ABNORMAL HIGH (ref 0.44–1.00)
GFR, Estimated: 27 mL/min — ABNORMAL LOW (ref >60)
Glucose, Bld: 118 mg/dL — ABNORMAL HIGH (ref 70–99)
Potassium: 3.8 mmol/L (ref 3.5–5.1)
Sodium: 138 mmol/L (ref 135–145)
Total Bilirubin: 0.6 mg/dL (ref 0.3–1.2)
Total Protein: 9 g/dL — ABNORMAL HIGH (ref 6.5–8.1)

## 2022-10-19 LAB — FERRITIN: Ferritin: 125 ng/mL (ref 11–307)

## 2022-10-20 ENCOUNTER — Other Ambulatory Visit: Payer: Self-pay

## 2022-10-20 ENCOUNTER — Telehealth: Payer: Self-pay | Admitting: Family Medicine

## 2022-10-20 LAB — KAPPA/LAMBDA LIGHT CHAINS
Kappa free light chain: 209.5 mg/L — ABNORMAL HIGH (ref 3.3–19.4)
Kappa, lambda light chain ratio: 10.85 — ABNORMAL HIGH (ref 0.26–1.65)
Lambda free light chains: 19.3 mg/L (ref 5.7–26.3)

## 2022-10-20 MED ORDER — BENAZEPRIL HCL 5 MG PO TABS: 5.0000 mg | ORAL_TABLET | Freq: Every day | ORAL | 2 refills | Status: AC

## 2022-10-20 NOTE — Telephone Encounter (Signed)
Medication sent.

## 2022-10-20 NOTE — Telephone Encounter (Signed)
Renee Collins called from [facility] need med refill benazepril (LOTENSIN) 5 MG tablet [[id number]]   Pharmacy [facility]

## 2022-10-22 LAB — PROTEIN ELECTROPHORESIS, SERUM
A/G Ratio: 0.8 (ref 0.7–1.7)
Albumin ELP: 3.8 g/dL (ref 2.9–4.4)
Alpha-1-Globulin: 0.3 g/dL (ref 0.0–0.4)
Alpha-2-Globulin: 0.9 g/dL (ref 0.4–1.0)
Beta Globulin: 1 g/dL (ref 0.7–1.3)
Gamma Globulin: 2.3 g/dL — ABNORMAL HIGH (ref 0.4–1.8)
Globulin, Total: 4.5 g/dL — ABNORMAL HIGH (ref 2.2–3.9)
M-Spike, %: 1.9 g/dL — ABNORMAL HIGH
Total Protein ELP: 8.3 g/dL (ref 6.0–8.5)

## 2022-10-25 NOTE — Progress Notes (Unsigned)
[facility] [address], [place] [address]   CLINIC:  Medical Oncology/Hematology  PCP:  Kerri Perches, MD [address] [place] [address] [phone]   REASON FOR VISIT:  Follow-up for MGUS and normocytic anemia  CURRENT THERAPY: Intermittent Feraheme (last on 02/15/2020)  INTERVAL HISTORY:   Renee Collins 82 y.o. female returns for routine follow-up of MGUS and anemia.  She was last evaluated via telemedicine visit by [facility], PA-C on 04/19/2022.  At today's visit, she reports feeling fairly well.  No recent hospitalizations, surgeries, or changes in baseline health status.  She does report some increasing fatigue for the past few months, which she attributes to grieving the loss of her granddaughter, who passed away in 01-22-2024from cancer.  She denies any bleeding episodes such as epistaxis, hematemesis, hematochezia, or melena.  She denies any chest pain, dyspnea on exertion, or syncope.  No new neurologic changes or vasomotor symptoms - she denies any neuropathy, vision changes, tinnitus, strokelike symptoms, dizziness, or headache.  She denies any new bony pain.   She has 70% energy and 75% appetite. She endorses that she is maintaining a stable weight.  ASSESSMENT & PLAN:  1.  IgG kappa MGUS: - Bone marrow biopsy on 05/16/2015 shows limited bone marrow sample with no increase in plasma cells in the very limited material present.  Cytogenetics-46, XX.  FISH panel was normal. - Immunofixation shows monoclonal protein with IgG kappa specificity - Bone survey on 02/22/2017 showed ill-defined calcified lesion in the distal right femur.  MRI of the right femur on 03/02/2017 showed no osseous lesion. - PET scan on 02/25/2020 was negative for any myeloma lesions or other malignancies. - Most recent skeletal survey (04/08/2022): No lytic or suspicious osseous lesion throughout the axial or appendicular skeleton - No B symptoms  or new bony pain - Most recent labs (10/19/2022):  M spike stable at 1.9 Elevated kappa 209.5, normal lambda 19.3, elevated ratio 10.85.  LDH normal. - No CRAB features at this time (10/19/2022) - creatinine 1.84 (baseline CKD), calcium 9.6, Hgb 11.1/MCV 86.3 - MGUS labs are stable at this visit. - PLAN:  Repeat MGUS/myeloma panel and RTC in 6 months.  Skeletal survey due in 6 months (October 2024)   2.  Normocytic anemia: -This is from chronic kidney disease and relative iron deficiency.  She last received IV Feraheme on 02/15/2020 - Denies any bleeding per rectum or melena - Most recent labs (10/19/2022): Ferritin 125, iron saturation 30%, Hgb 11.1/MCV 86.3 - Hemoglobin is at goal considering underlying CKD stage IIIb/IV - If patient has worsening anemia with Hgb < 10.0, will consider starting her on ESA - PLAN: No treatment needed at this time.  Repeat anemia panel in 6 months.   3.  CKD stage IIIb/IV: - Per note by Dr. Wolfgang Phoenix, CKD is secondary to diabetes mellitus - Creatinine is usually between 1.5 and 1.8 - Renal ultrasound on 01/24/2020 shows small right renal cyst with no additional findings. - PLAN: Patient to continue follow-up with Dr. Wolfgang Phoenix   PLAN SUMMARY: >> Labs in 6 months = CBC/D, CMP, LDH, SPEP, light chains, ferritin, iron/TIBC >> SKELETAL SURVEY (whole body x-rays) in 6 months >> OFFICE visit in 6 months (1 week after labs/x-ray)     REVIEW OF SYSTEMS:   Review of Systems  Constitutional:  Positive for fatigue. Negative for appetite change, chills, diaphoresis, fever and unexpected weight change.  HENT:   Negative  for lump/mass and nosebleeds.   Eyes:  Negative for eye problems.  Respiratory:  Negative for cough, hemoptysis and shortness of breath.   Cardiovascular:  Negative for chest pain, leg swelling and palpitations.  Gastrointestinal:  Negative for abdominal pain, blood in stool, constipation, diarrhea, nausea and vomiting.  Genitourinary:  Negative for  hematuria.   Musculoskeletal:  Positive for back pain.  Skin: Negative.   Neurological:  Negative for dizziness, headaches and light-headedness.  Hematological:  Does not bruise/bleed easily.     PHYSICAL EXAM:  ECOG PERFORMANCE STATUS: 1 - Symptomatic but completely ambulatory  There were no vitals filed for this visit. There were no vitals filed for this visit. Physical Exam Constitutional:      Appearance: Normal appearance. She is obese.  Cardiovascular:     Heart sounds: Murmur heard.  Pulmonary:     Breath sounds: Normal breath sounds.  Neurological:     General: No focal deficit present.     Mental Status: Mental status is at baseline.  Psychiatric:        Behavior: Behavior normal. Behavior is cooperative.     PAST MEDICAL/SURGICAL HISTORY:  Past Medical History:  Diagnosis Date   Anemia    Chronic back pain    Chronic kidney disease    Depression    GERD (gastroesophageal reflux disease)    Heart murmur    Hyperlipidemia    Hypertension    Hypothyroidism    Pre-diabetes    Past Surgical History:  Procedure Laterality Date   ANTERIOR CERVICAL DECOMP/DISCECTOMY FUSION N/A 09/15/2021   Procedure: Anterior Cervical Decompression Discectomy Fusion Cervical three-four, Cervical four-five;  Surgeon: Bedelia Person, MD;  Location: [facility] OR;  Service: Neurosurgery;  Laterality: N/A;   BONE MARROW ASPIRATION Left 05/16/15   BONE MARROW BIOPSY Left 05/16/15   COLONOSCOPY  03/30/2012   Procedure: COLONOSCOPY;  Surgeon: Malissa Hippo, MD;  [facility];  Service: Endoscopy;  Laterality: N/A;  730   COLONOSCOPY N/A 06/30/2017   Procedure: COLONOSCOPY;  Surgeon: Malissa Hippo, MD;  Location: [facility];  Service: Endoscopy;  Laterality: N/A;  2:00   LAMINECTOMY  1980's   POLYPECTOMY  06/30/2017   Procedure: POLYPECTOMY;  Surgeon: Malissa Hippo, MD;  Location: [facility];  Service: Endoscopy;;  colon   SPINE SURGERY  approx 1983   dr Jeral Fruit   TOTAL  ABDOMINAL HYSTERECTOMY  approx 1993   fibroids     SOCIAL HISTORY:  Social History   Socioeconomic History   Marital status: Divorced    Spouse name: Not on file   Number of children: 3   Years of education: Not on file   Highest education level: Not on file  Occupational History   Occupation: LPN  Tobacco Use   Smoking status: Never   Smokeless tobacco: Never  Vaping Use   Vaping Use: Never used  Substance and Sexual Activity   Alcohol use: No   Drug use: No   Sexual activity: Not Currently  Other Topics Concern   Not on file  Social History Narrative   Pt has 2 living adult children, 1 desease at age 2 secondary congential heart disease.   7 grandchildren.   Social Determinants of Health   Financial Resource Strain: Low Risk  (04/15/2021)   Overall Financial Resource Strain (CARDIA)    Difficulty of Paying Living Expenses: Not hard at all  Food Insecurity: No Food Insecurity (04/15/2021)   Hunger Vital Sign  Worried About Programme researcher, broadcasting/film/video in the Last Year: Never true    Ran Out of Food in the Last Year: Never true  Transportation Needs: Unmet Transportation Needs (04/15/2021)   PRAPARE - Administrator, Civil Service (Medical): Yes    Lack of Transportation (Non-Medical): Yes  Physical Activity: Insufficiently Active (04/15/2021)   Exercise Vital Sign    Days of Exercise per Week: 3 days    Minutes of Exercise per Session: 30 min  Stress: No Stress Concern Present (04/15/2021)   [employer] of Occupational Health - Occupational Stress Questionnaire    Feeling of Stress : Not at all  Social Connections: Moderately Integrated (04/15/2021)   Social Connection and Isolation Panel [NHANES]    Frequency of Communication with Friends and Family: More than three times a week    Frequency of Social Gatherings with Friends and Family: More than three times a week    Attends Religious Services: 1 to 4 times per year    Active Member of [employer] or  Organizations: Yes    Attends Banker Meetings: 1 to 4 times per year    Marital Status: Divorced  Catering manager Violence: Not At Risk (04/15/2021)   Humiliation, Afraid, Rape, and Kick questionnaire    Fear of Current or Ex-Partner: No    Emotionally Abused: No    Physically Abused: No    Sexually Abused: No    FAMILY HISTORY:  Family History  Problem Relation Age of Onset   Heart attack Mother 39   Heart disease Mother    Cancer Mother        type unknown   Diabetes Mother    Kidney failure Father    Kidney disease Father    Myasthenia gravis Brother    Diabetes Sister    Breast cancer Sister 10   Heart disease Brother    Diabetes Sister    Sickle cell anemia Sister    Cancer Sister        unknown type    CURRENT MEDICATIONS:  Outpatient Encounter Medications as of 10/26/2022  Medication Sig   acetaminophen (TYLENOL) 650 MG CR tablet Take 650 mg by mouth every 8 (eight) hours as needed for pain.    amLODipine (NORVASC) 10 MG tablet TAKE 1 TABLET BY MOUTH DAILY   aspirin 81 MG EC tablet Take 81 mg by mouth daily.   benazepril (LOTENSIN) 5 MG tablet Take 1 tablet (5 mg total) by mouth daily.   buPROPion (WELLBUTRIN XL) 150 MG 24 hr tablet Take 1 tablet (150 mg total) by mouth daily.   calcitRIOL (ROCALTROL) 0.25 MCG capsule Take 0.25 mcg by mouth every Monday, Wednesday, and Friday.   Calcium-Magnesium-Vitamin D (CALCIUM 1200+D3 PO) Take 1 tablet by mouth 2 (two) times daily.   diphenhydrAMINE (BENADRYL) 25 mg capsule Take 25 mg by mouth at bedtime as needed for allergies or sleep.   gabapentin (NEURONTIN) 100 MG capsule TAKE 1 TO 2 CAPSULES BY  MOUTH 3 TIMES DAILY   hydrochlorothiazide (HYDRODIURIL) 25 MG tablet TAKE 1 TABLET BY MOUTH DAILY   levothyroxine (SYNTHROID) 88 MCG tablet TAKE 1 TABLET BY MOUTH ONCE DAILY. 1/2 HOUR BEFORE FIRST MEAL OF THE DAY. TAKE WITH WATER.   Misc Natural Products (JOINT HEALTH PO) Take 1 capsule by mouth daily.   Multiple  Vitamin (MULTIVITAMIN WITH MINERALS) TABS tablet Take 1 tablet by mouth daily.   omeprazole (PRILOSEC) 20 MG capsule Take 1 capsule (20 mg total) by  mouth daily as needed (for acid reflux.).   pravastatin (PRAVACHOL) 20 MG tablet TAKE 1 TABLET BY MOUTH IN THE  EVENING   No facility-administered encounter medications on file as of 10/26/2022.    ALLERGIES:  No Known Allergies  LABORATORY DATA:  I have reviewed the labs as listed.  CBC    Component Value Date/Time   WBC 5.6 10/19/2022 0846   RBC 4.15 10/19/2022 0846   HGB 11.1 (L) 10/19/2022 0846   HGB 12.0 07/24/2021 1402   HCT 35.8 (L) 10/19/2022 0846   HCT 36.1 07/24/2021 1402   PLT 227 10/19/2022 0846   PLT 233 07/24/2021 1402   MCV 86.3 10/19/2022 0846   MCV 83 07/24/2021 1402   MCH 26.7 10/19/2022 0846   MCHC 31.0 10/19/2022 0846   RDW 13.6 10/19/2022 0846   RDW 12.4 07/24/2021 1402   LYMPHSABS 2.3 10/19/2022 0846   MONOABS 0.5 10/19/2022 0846   EOSABS 0.4 10/19/2022 0846   BASOSABS 0.0 10/19/2022 0846      Latest Ref Rng & Units 10/19/2022    8:46 AM 07/13/2022    1:37 PM 04/08/2022    9:54 AM  CMP  Glucose 70 - 99 mg/dL 161   096   BUN 8 - 23 mg/dL 28   23   Creatinine 0.45 - 1.00 mg/dL 4.09   8.11   Sodium 914 - 145 mmol/L 138   139   Potassium 3.5 - 5.1 mmol/L 3.8   3.6   Chloride 98 - 111 mmol/L 106   110   CO2 22 - 32 mmol/L 23   23   Calcium 8.9 - 10.3 mg/dL 9.6   9.3   Total Protein 6.5 - 8.1 g/dL 9.0  8.6  9.0   Total Bilirubin 0.3 - 1.2 mg/dL 0.6  0.4  0.7   Alkaline Phos 38 - 126 U/L 72  80  65   AST 15 - 41 U/L 24  24  25    ALT 0 - 44 U/L 23  16  21      DIAGNOSTIC IMAGING:  I have independently reviewed the relevant imaging and discussed with the patient.   WRAP UP:  All questions were answered. The patient knows to call the clinic with any problems, questions or concerns.  Medical decision making: Moderate  Time spent on visit: I spent 20 minutes counseling the patient face to face. The  total time spent in the appointment was 30 minutes and more than 50% was on counseling.  Carnella Guadalajara, PA-C  10/26/2022 9:25 AM

## 2022-10-26 ENCOUNTER — Other Ambulatory Visit: Payer: Self-pay

## 2022-10-26 ENCOUNTER — Ambulatory Visit: Payer: Medicare Other | Admitting: Physician Assistant

## 2022-10-26 ENCOUNTER — Inpatient Hospital Stay (HOSPITAL_BASED_OUTPATIENT_CLINIC_OR_DEPARTMENT_OTHER): Payer: No Typology Code available for payment source | Admitting: Physician Assistant

## 2022-10-26 VITALS — BP 145/70 | HR 96 | Temp 98.0°F | Resp 18 | Wt 189.6 lb

## 2022-10-26 DIAGNOSIS — D472 Monoclonal gammopathy: Secondary | ICD-10-CM

## 2022-10-26 DIAGNOSIS — N1832 Chronic kidney disease, stage 3b: Secondary | ICD-10-CM | POA: Diagnosis not present

## 2022-10-26 DIAGNOSIS — D631 Anemia in chronic kidney disease: Secondary | ICD-10-CM

## 2022-10-26 NOTE — Patient Instructions (Signed)
[facility] Discharge Instructions  You were seen today by Rojelio Brenner PA-C for your abnormal protein (MGUS) and your anemia related to chronic kidney disease.  MGUS: As we discussed, this is an abnormal protein in your blood.  In some cases, it can progress to a type of cancer known as multiple myeloma.  You do not show any signs of progression at this time, but we will continue to monitor you with repeat labs and an office visit in 6 months.  We will also check XRAYS in 6 months to make sure you do not have any abnormal spots on your bones.  ANEMIA: Your blood count is slightly lower than normal, which is related to your underlying chronic kidney disease.  At this time, you do not need any treatment for your anemia, but we will continue to monitor it.   LABS: Return in 6 months for repeat labs  OTHER TESTS: Whole-body Xrays in 6 months  MEDICATIONS: No changes to home medications  FOLLOW-UP APPOINTMENT: Office visit in 6 months, after labs and Xrays   ** Thank you for trusting me with your healthcare!  I strive to provide all of my patients with quality care at each visit.  If you receive a survey for this visit, I would be so grateful to you for taking the time to provide feedback.  Thank you in advance!  ~ Caliya Narine                   Dr. Doreatha Massed   &   Rojelio Brenner, PA-C   - - - - - - - - - - - - - - - - - -     Thank you for choosing [facility] to provide your oncology and hematology care.  To afford each patient quality time with our provider, please arrive at least 15 minutes before your scheduled appointment time.   If you have a lab appointment with the Cancer Center please come in thru the Main Entrance and check in at the main information desk.  You need to re-schedule your appointment should you arrive 10 or more minutes late.  We strive to give you quality time with our  providers, and arriving late affects you and other patients whose appointments are after yours.  Also, if you no show three or more times for appointments you may be dismissed from the clinic at the providers discretion.     Again, thank you for choosing [facility].  Our hope is that these requests will decrease the amount of time that you wait before being seen by our physicians.       _____________________________________________________________  Should you have questions after your visit to [facility], please contact our office at [phone] and follow the prompts.  Our office hours are 8:00 a.m. and 4:30 p.m. Monday - Friday.  Please note that voicemails left after 4:00 p.m. may not be returned until the following business day.  We are closed weekends and major holidays.  You do have access to a nurse 24-7, just call the main number to the clinic [phone] and do not press any options, hold on the line and a nurse will answer the phone.    For prescription refill requests, have your pharmacy contact our office and allow 72 hours.    Due to Covid, you will need to wear a mask upon entering  the hospital. If you do not have a mask, a mask will be given to you at the Main Entrance upon arrival. For doctor visits, patients may have 1 support person age 6 or older with them. For treatment visits, patients can not have anyone with them due to social distancing guidelines and our immunocompromised population.

## 2022-11-22 ENCOUNTER — Encounter: Payer: Self-pay | Admitting: Family Medicine

## 2022-11-23 ENCOUNTER — Other Ambulatory Visit: Payer: Self-pay

## 2022-11-23 MED ORDER — PRAVASTATIN SODIUM 20 MG PO TABS: 20.0000 mg | ORAL_TABLET | Freq: Every evening | ORAL | 2 refills | Status: DC

## 2022-11-23 MED ORDER — GABAPENTIN 100 MG PO CAPS: ORAL_CAPSULE | ORAL | 2 refills | Status: DC

## 2022-11-23 NOTE — Telephone Encounter (Signed)
Refills sent

## 2022-12-08 DIAGNOSIS — H524 Presbyopia: Secondary | ICD-10-CM | POA: Diagnosis not present

## 2022-12-08 LAB — HM DIABETES EYE EXAM

## 2022-12-10 ENCOUNTER — Encounter: Payer: Self-pay | Admitting: Family Medicine

## 2022-12-10 ENCOUNTER — Ambulatory Visit (INDEPENDENT_AMBULATORY_CARE_PROVIDER_SITE_OTHER): Payer: No Typology Code available for payment source | Admitting: Family Medicine

## 2022-12-10 ENCOUNTER — Ambulatory Visit (HOSPITAL_COMMUNITY)
Admission: RE | Admit: 2022-12-10 | Discharge: 2022-12-10 | Disposition: A | Discharge status: Home / Self Care | Payer: No Typology Code available for payment source | Source: Ambulatory Visit | Attending: Family Medicine | Admitting: Family Medicine

## 2022-12-10 VITALS — BP 128/75 | HR 90 | Ht 65.0 in | Wt 187.0 lb

## 2022-12-10 DIAGNOSIS — Z6831 Body mass index (BMI) 31.0-31.9, adult: Secondary | ICD-10-CM | POA: Diagnosis not present

## 2022-12-10 DIAGNOSIS — G8929 Other chronic pain: Secondary | ICD-10-CM | POA: Diagnosis not present

## 2022-12-10 DIAGNOSIS — E1149 Type 2 diabetes mellitus with other diabetic neurological complication: Secondary | ICD-10-CM

## 2022-12-10 DIAGNOSIS — M25552 Pain in left hip: Secondary | ICD-10-CM

## 2022-12-10 DIAGNOSIS — F324 Major depressive disorder, single episode, in partial remission: Secondary | ICD-10-CM

## 2022-12-10 DIAGNOSIS — E039 Hypothyroidism, unspecified: Secondary | ICD-10-CM | POA: Diagnosis not present

## 2022-12-10 DIAGNOSIS — I10 Essential (primary) hypertension: Secondary | ICD-10-CM | POA: Diagnosis not present

## 2022-12-10 DIAGNOSIS — E6609 Other obesity due to excess calories: Secondary | ICD-10-CM

## 2022-12-10 DIAGNOSIS — E66811 Obesity, class 1: Secondary | ICD-10-CM

## 2022-12-10 DIAGNOSIS — E785 Hyperlipidemia, unspecified: Secondary | ICD-10-CM

## 2022-12-10 NOTE — Patient Instructions (Signed)
F/u ion 4 months, call if you need me sooner  Labs today, lipid, cmp and EGFR, hBa1C, TSh today  Nurse pls send for eye exam report Dr Charise Killian done this past Wednesday  It is important that you exercise regularly at least 30 minutes 5 times a week. If you develop chest pain, have severe difficulty breathing, or feel very tired, stop exercising immediately and seek medical attention   Think about what you will eat, plan ahead. Choose " clean, green, fresh or frozen" over canned, processed or packaged foods which are more sugary, salty and fatty. 70 to 75% of food eaten should be vegetables and fruit. Three meals at set times with snacks allowed between meals, but they must be fruit or vegetables. Aim to eat over a 12 hour period , example 7 am to 7 pm, and STOP after  your last meal of the day. Drink water,generally about 64 ounces per day, no other drink is as healthy. Fruit juice is best enjoyed in a healthy way, by EATING the fruit.  PLEASE wean off of gabapentin if able , start twice daily for 1 week, then once daily then stop. If you start experiencing pain, then need to resume/ continue the gabapentin  Stool softener, multivitamin and joint health tablets  may be stopped when you finish what you have  X ray of left hip today, diagnosis is arthritis  Thanks for choosing [facility], we consider it a privelige to serve you.

## 2022-12-10 NOTE — Assessment & Plan Note (Signed)
One month h/o left hip pain, after being on acruise, worse in the am, improved with activityt and relieved with tylenol twice daily

## 2022-12-11 LAB — CMP14+EGFR
ALT: 14 IU/L (ref 0–32)
AST: 21 IU/L (ref 0–40)
Albumin/Globulin Ratio: 1
Albumin: 4.2 g/dL (ref 3.7–4.7)
Alkaline Phosphatase: 73 IU/L (ref 44–121)
BUN/Creatinine Ratio: 13 (ref 12–28)
BUN: 25 mg/dL (ref 8–27)
Bilirubin Total: 0.4 mg/dL (ref 0.0–1.2)
CO2: 17 mmol/L — ABNORMAL LOW (ref 20–29)
Calcium: 9.8 mg/dL (ref 8.7–10.3)
Chloride: 105 mmol/L (ref 96–106)
Creatinine, Ser: 1.86 mg/dL — ABNORMAL HIGH (ref 0.57–1.00)
Globulin, Total: 4.3 g/dL (ref 1.5–4.5)
Glucose: 97 mg/dL (ref 70–99)
Potassium: 3.9 mmol/L (ref 3.5–5.2)
Sodium: 141 mmol/L (ref 134–144)
Total Protein: 8.5 g/dL (ref 6.0–8.5)
eGFR: 27 mL/min/{1.73_m2} — ABNORMAL LOW (ref >59)

## 2022-12-11 LAB — LIPID PANEL
Chol/HDL Ratio: 2.2 ratio (ref 0.0–4.4)
Cholesterol, Total: 139 mg/dL (ref 100–199)
HDL: 64 mg/dL (ref >39)
LDL Chol Calc (NIH): 61 mg/dL (ref 0–99)
Triglycerides: 69 mg/dL (ref 0–149)
VLDL Cholesterol Cal: 14 mg/dL (ref 5–40)

## 2022-12-11 LAB — TSH: TSH: 1.98 u[IU]/mL (ref 0.450–4.500)

## 2022-12-11 LAB — HEMOGLOBIN A1C
Est. average glucose Bld gHb Est-mCnc: 143 mg/dL
Hgb A1c MFr Bld: 6.6 % — ABNORMAL HIGH (ref 4.8–5.6)

## 2022-12-15 ENCOUNTER — Encounter: Payer: Self-pay | Admitting: Family Medicine

## 2022-12-15 ENCOUNTER — Telehealth: Payer: Self-pay | Admitting: Family Medicine

## 2022-12-15 DIAGNOSIS — M25552 Pain in left hip: Secondary | ICD-10-CM | POA: Insufficient documentation

## 2022-12-15 MED ORDER — HYDROCHLOROTHIAZIDE 25 MG PO TABS: 25.0000 mg | ORAL_TABLET | Freq: Every day | ORAL | 2 refills | Status: DC

## 2022-12-15 MED ORDER — AMLODIPINE BESYLATE 10 MG PO TABS: 10.0000 mg | ORAL_TABLET | Freq: Every day | ORAL | 2 refills | Status: DC

## 2022-12-15 NOTE — Assessment & Plan Note (Signed)
Controlled, no change in medication  

## 2022-12-15 NOTE — Assessment & Plan Note (Signed)
  Patient re-educated about  the importance of commitment to a  minimum of 150 minutes of exercise per week as able.  The importance of healthy food choices with portion control discussed, as well as eating regularly and within a 12 hour window most days. The need to choose "clean , green" food 50 to 75% of the time is discussed, as well as to make water the primary drink and set a goal of 64 ounces water daily.       12/10/2022    9:45 AM 10/26/2022    8:55 AM 07/13/2022   11:15 AM  Weight /BMI  Weight 187 lb 0.6 oz 189 lb 9.5 oz 190 lb  Height 5\' 5"  (1.651 m)  5\' 5"  (1.651 m)  BMI 31.13 kg/m2 31.55 kg/m2 31.62 kg/m2

## 2022-12-15 NOTE — Assessment & Plan Note (Signed)
[name] is reminded of the importance of commitment to daily physical activity for 30 minutes or more, as able and the need to limit carbohydrate intake to 30 to 60 grams per meal to help with blood sugar control.   [name] is reminded of the importance of daily foot exam, annual eye examination, and good blood sugar, blood pressure and cholesterol control. Controlled , no change in management     Latest Ref Rng & Units 12/10/2022   11:06 AM 10/19/2022    8:46 AM 07/13/2022    1:37 PM 04/08/2022    9:54 AM 02/23/2022   10:48 AM  Diabetic Labs  HbA1c 4.8 - 5.6 % 6.6   6.5     Micro/Creat Ratio 0 - 29 mg/g creat     65   Chol 100 - 199 mg/dL 527   782     HDL >42 mg/dL 64   63     Calc LDL 0 - 99 mg/dL 61   75     Triglycerides 0 - 149 mg/dL 69   90     Creatinine 0.57 - 1.00 mg/dL 3.53  6.14   4.31        12/10/2022    9:45 AM 10/26/2022    8:55 AM 09/29/2022   10:13 AM 07/13/2022   11:15 AM 06/09/2022   10:54 AM 04/20/2022    9:05 AM 02/23/2022    9:21 AM  BP/Weight  Systolic BP 128 145 133 121 137 144 122  Diastolic BP 75 70 78 71 74 75 74  Wt. (Lbs) 187.04 189.6  190  192.4 191  BMI 31.13 kg/m2 31.55 kg/m2  31.62 kg/m2  31.53 kg/m2 30.83 kg/m2      Latest Ref Rng & Units 12/08/2022   12:00 AM 02/23/2022    9:20 AM  Foot/eye exam completion dates  Eye Exam No Retinopathy No Retinopathy       Foot Form Completion   Done     This result is from an external source.

## 2022-12-15 NOTE — Assessment & Plan Note (Signed)
DASH diet and commitment to daily physical activity for a minimum of 30 minutes discussed and encouraged, as a part of hypertension management. The importance of attaining a healthy weight is also discussed.     12/10/2022    9:45 AM 10/26/2022    8:55 AM 09/29/2022   10:13 AM 07/13/2022   11:15 AM 06/09/2022   10:54 AM 04/20/2022    9:05 AM 02/23/2022    9:21 AM  BP/Weight  Systolic BP 128 145 133 121 137 144 122  Diastolic BP 75 70 78 71 74 75 74  Wt. (Lbs) 187.04 189.6  190  192.4 191  BMI 31.13 kg/m2 31.55 kg/m2  31.62 kg/m2  31.53 kg/m2 30.83 kg/m2     Controlled, no change in medication

## 2022-12-15 NOTE — Assessment & Plan Note (Signed)
Hyperlipidemia:Low fat diet discussed and encouraged.   Lipid Panel  Lab Results  Component Value Date   CHOL 139 12/10/2022   HDL 64 12/10/2022   LDLCALC 61 12/10/2022   TRIG 69 12/10/2022   CHOLHDL 2.2 12/10/2022     Controlled, no change in medication

## 2022-12-15 NOTE — Progress Notes (Signed)
Renee Collins     MRN: [id number]      DOB: [birth year]  Chief Complaint  Patient presents with   Follow-up    Follow up hip pains in the morning when she gets up    HPI Renee Collins is here for follow up and re-evaluation of chronic medical conditions, medication management and review of any available recent lab and radiology data.  Preventive health is updated, specifically  Cancer screening and Immunization.   Questions or concerns regarding consultations or procedures which the PT has had in the interim are  addressed. The PT denies any adverse reactions to current medications since the last visit.  There are no new concerns.  C/o bilateral hip pain on first awakening , improves with movement, left worse tha right  ROS Denies recent fever or chills. Denies sinus pressure, nasal congestion, ear pain or sore throat. Denies chest congestion, productive cough or wheezing. Denies chest pains, palpitations and leg swelling Denies abdominal pain, nausea, vomiting,diarrhea or constipation.   Denies dysuria, frequency, hesitancy or incontinence.  Denies headaches, seizures, numbness, or tingling. Denies uncontrolled depression, anxiety or insomnia. Denies skin break down or rash.   PE  BP 128/75 (BP Location: Right Arm, Patient Position: Sitting, Cuff Size: Normal)   Pulse 90   Ht 5\' 5"  (1.651 m)   Wt 187 lb 0.6 oz (84.8 kg)   SpO2 97%   BMI 31.13 kg/m   Patient alert and oriented and in no cardiopulmonary distress.  HEENT: No facial asymmetry, EOMI,     Neck supple .  Chest: Clear to auscultation bilaterally.  CVS: S1, S2 no murmurs, no S3.Regular rate.  ABD: Soft non tender.   Ext: No edema  MS: Adequate ROM spine, shoulders,  and knees.Mildly decreased in left hip  Skin: Intact, no ulcerations or rash noted.  Psych: Good eye contact, normal affect. Memory intact not anxious or depressed appearing.  CNS: CN 2-12 intact, power,  normal throughout.no focal deficits  noted.   Assessment & Plan  Chronic left hip pain One month h/o left hip pain, after being on acruise, worse in the am, improved with activityt and relieved with tylenol twice daily  Type 2 diabetes mellitus (HCC) Ms. Truxal is reminded of the importance of commitment to daily physical activity for 30 minutes or more, as able and the need to limit carbohydrate intake to 30 to 60 grams per meal to help with blood sugar control.   Ms. Ernzen is reminded of the importance of daily foot exam, annual eye examination, and good blood sugar, blood pressure and cholesterol control. Controlled , no change in management     Latest Ref Rng & Units 12/10/2022   11:06 AM 10/19/2022    8:46 AM 07/13/2022    1:37 PM 04/08/2022    9:54 AM 02/23/2022   10:48 AM  Diabetic Labs  HbA1c 4.8 - 5.6 % 6.6   6.5     Micro/Creat Ratio 0 - 29 mg/g creat     65   Chol 100 - 199 mg/dL 409   811     HDL >91 mg/dL 64   63     Calc LDL 0 - 99 mg/dL 61   75     Triglycerides 0 - 149 mg/dL 69   90     Creatinine 0.57 - 1.00 mg/dL 4.78  2.95   6.21        12/10/2022    9:45 AM 10/26/2022  8:55 AM 09/29/2022   10:13 AM 07/13/2022   11:15 AM 06/09/2022   10:54 AM 04/20/2022    9:05 AM 02/23/2022    9:21 AM  BP/Weight  Systolic BP 128 145 133 121 137 144 122  Diastolic BP 75 70 78 71 74 75 74  Wt. (Lbs) 187.04 189.6  190  192.4 191  BMI 31.13 kg/m2 31.55 kg/m2  31.62 kg/m2  31.53 kg/m2 30.83 kg/m2      Latest Ref Rng & Units 12/08/2022   12:00 AM 02/23/2022    9:20 AM  Foot/eye exam completion dates  Eye Exam No Retinopathy No Retinopathy       Foot Form Completion   Done     This result is from an external source.        Essential hypertension DASH diet and commitment to daily physical activity for a minimum of 30 minutes discussed and encouraged, as a part of hypertension management. The importance of attaining a healthy weight is also discussed.     12/10/2022    9:45 AM 10/26/2022    8:55 AM 09/29/2022    10:13 AM 07/13/2022   11:15 AM 06/09/2022   10:54 AM 04/20/2022    9:05 AM 02/23/2022    9:21 AM  BP/Weight  Systolic BP 128 145 133 121 137 144 122  Diastolic BP 75 70 78 71 74 75 74  Wt. (Lbs) 187.04 189.6  190  192.4 191  BMI 31.13 kg/m2 31.55 kg/m2  31.62 kg/m2  31.53 kg/m2 30.83 kg/m2     Controlled, no change in medication   Dyslipidemia Hyperlipidemia:Low fat diet discussed and encouraged.   Lipid Panel  Lab Results  Component Value Date   CHOL 139 12/10/2022   HDL 64 12/10/2022   LDLCALC 61 12/10/2022   TRIG 69 12/10/2022   CHOLHDL 2.2 12/10/2022     Controlled, no change in medication   Depression, major, single episode, in partial remission (HCC) Controlled, no change in medication   Obesity, unspecified  Patient re-educated about  the importance of commitment to a  minimum of 150 minutes of exercise per week as able.  The importance of healthy food choices with portion control discussed, as well as eating regularly and within a 12 hour window most days. The need to choose "clean , green" food 50 to 75% of the time is discussed, as well as to make water the primary drink and set a goal of 64 ounces water daily.       12/10/2022    9:45 AM 10/26/2022    8:55 AM 07/13/2022   11:15 AM  Weight /BMI  Weight 187 lb 0.6 oz 189 lb 9.5 oz 190 lb  Height 5\' 5"  (1.651 m)  5\' 5"  (1.651 m)  BMI 31.13 kg/m2 31.55 kg/m2 31.62 kg/m2

## 2022-12-15 NOTE — Telephone Encounter (Signed)
Patient calling stating that she needs refills on her medicines but she has changed pharmacy to [facility] ; hydrochlorothizaide, amlodipine; to be sent

## 2022-12-31 DIAGNOSIS — E211 Secondary hyperparathyroidism, not elsewhere classified: Secondary | ICD-10-CM | POA: Diagnosis not present

## 2022-12-31 DIAGNOSIS — R809 Proteinuria, unspecified: Secondary | ICD-10-CM | POA: Diagnosis not present

## 2022-12-31 DIAGNOSIS — D472 Monoclonal gammopathy: Secondary | ICD-10-CM | POA: Diagnosis not present

## 2022-12-31 DIAGNOSIS — N182 Chronic kidney disease, stage 2 (mild): Secondary | ICD-10-CM | POA: Diagnosis not present

## 2023-01-06 DIAGNOSIS — N1832 Chronic kidney disease, stage 3b: Secondary | ICD-10-CM | POA: Diagnosis not present

## 2023-01-06 DIAGNOSIS — D472 Monoclonal gammopathy: Secondary | ICD-10-CM | POA: Diagnosis not present

## 2023-01-06 DIAGNOSIS — E1129 Type 2 diabetes mellitus with other diabetic kidney complication: Secondary | ICD-10-CM | POA: Diagnosis not present

## 2023-01-06 DIAGNOSIS — R809 Proteinuria, unspecified: Secondary | ICD-10-CM | POA: Diagnosis not present

## 2023-01-24 ENCOUNTER — Encounter: Payer: Self-pay | Admitting: Family Medicine

## 2023-01-25 ENCOUNTER — Other Ambulatory Visit: Payer: Self-pay

## 2023-01-25 DIAGNOSIS — F324 Major depressive disorder, single episode, in partial remission: Secondary | ICD-10-CM

## 2023-01-25 MED ORDER — BUPROPION HCL ER (XL) 150 MG PO TB24
150.0000 mg | ORAL_TABLET | Freq: Every day | ORAL | 3 refills | Status: DC
Start: 2023-01-25 — End: 2024-01-23

## 2023-01-25 MED ORDER — HYDROCHLOROTHIAZIDE 25 MG PO TABS: 25.0000 mg | ORAL_TABLET | Freq: Every day | ORAL | 2 refills | Status: DC

## 2023-01-25 NOTE — Telephone Encounter (Signed)
Refills sent

## 2023-02-03 DIAGNOSIS — H25813 Combined forms of age-related cataract, bilateral: Secondary | ICD-10-CM | POA: Diagnosis not present

## 2023-02-03 DIAGNOSIS — H01004 Unspecified blepharitis left upper eyelid: Secondary | ICD-10-CM | POA: Diagnosis not present

## 2023-02-03 DIAGNOSIS — H01002 Unspecified blepharitis right lower eyelid: Secondary | ICD-10-CM | POA: Diagnosis not present

## 2023-02-03 DIAGNOSIS — H01001 Unspecified blepharitis right upper eyelid: Secondary | ICD-10-CM | POA: Diagnosis not present

## 2023-02-09 ENCOUNTER — Encounter: Payer: Self-pay | Admitting: Podiatry

## 2023-02-09 ENCOUNTER — Ambulatory Visit (INDEPENDENT_AMBULATORY_CARE_PROVIDER_SITE_OTHER): Payer: No Typology Code available for payment source | Admitting: Podiatry

## 2023-02-09 DIAGNOSIS — L84 Corns and callosities: Secondary | ICD-10-CM

## 2023-02-09 DIAGNOSIS — M79674 Pain in right toe(s): Secondary | ICD-10-CM | POA: Diagnosis not present

## 2023-02-09 DIAGNOSIS — M79675 Pain in left toe(s): Secondary | ICD-10-CM

## 2023-02-09 DIAGNOSIS — N1832 Chronic kidney disease, stage 3b: Secondary | ICD-10-CM

## 2023-02-09 DIAGNOSIS — E0822 Diabetes mellitus due to underlying condition with diabetic chronic kidney disease: Secondary | ICD-10-CM

## 2023-02-09 DIAGNOSIS — Z794 Long term (current) use of insulin: Secondary | ICD-10-CM

## 2023-02-09 DIAGNOSIS — B351 Tinea unguium: Secondary | ICD-10-CM | POA: Diagnosis not present

## 2023-02-09 DIAGNOSIS — Q828 Other specified congenital malformations of skin: Secondary | ICD-10-CM

## 2023-02-14 DIAGNOSIS — H25811 Combined forms of age-related cataract, right eye: Secondary | ICD-10-CM | POA: Diagnosis not present

## 2023-02-14 NOTE — Progress Notes (Signed)
  Subjective:  Patient ID: Renee Collins, female    DOB: [birth year],  MRN: [id number]  [name] presents to clinic today for at risk foot care. Pt has h/o NIDDM with chronic kidney disease and callus(es) left foot, porokeratotic lesion(s) left foot, and painful mycotic nails. Painful toenails interfere with ambulation. Aggravating factors include wearing enclosed shoe gear. Pain is relieved with periodic professional debridement. Painful callus(es) and porokeratotic lesion(s) are aggravated when weightbearing with and without shoegear. Pain is relieved with periodic professional debridement.   New problem(s): None.   PCP is Kerri Perches, MD.  No Known Allergies  Review of Systems: Negative except as noted in the HPI.  Objective: No changes noted in today's physical examination. There were no vitals filed for this visit. [name] is a pleasant 82 y.o. female in NAD. AAO x 3.   Vascular Examination: Capillary refill time immediate b/l. Vascular status intact b/l with palpable pedal pulses. Pedal hair present b/l. No pain with calf compression b/l. Skin temperature gradient WNL b/l. No cyanosis or clubbing b/l. No ischemia or gangrene noted b/l.   Neurological Examination: Sensation grossly intact b/l with 10 gram monofilament. Vibratory sensation intact b/l.   Dermatological Examination: Pedal skin with normal turgor, texture and tone b/l.  No open wounds. No interdigital macerations.   Toenails 1-5 b/l thick, discolored, elongated with subungual debris and pain on dorsal palpation.   Hyperkeratotic lesion(s) submet head 5 left foot.  No erythema, no edema, no drainage, no fluctuance. Porokeratotic lesion(s) lateral aspect left foot. No erythema, no edema, no drainage, no fluctuance.  Musculoskeletal Examination: Muscle strength 5/5 to all lower extremity muscle groups bilaterally. No pain, crepitus or joint limitation noted with ROM bilateral LE. No gross bony  deformities bilaterally.  Radiographs: None  Last A1c:      Latest Ref Rng & Units 12/10/2022   11:06 AM 07/13/2022    1:37 PM 02/17/2022    9:25 AM  Hemoglobin A1C  Hemoglobin-A1c 4.8 - 5.6 % 6.6  6.5  6.7    Assessment/Plan: 1. Pain due to onychomycosis of toenails of both feet   2. Callus   3. Porokeratosis   4. 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 All patient's and/or POA's questions/concerns answered on today's visit. -Continue foot and shoe inspections daily. Monitor blood glucose per PCP/Endocrinologist's recommendations. -Patient to continue soft, supportive shoe gear daily. -Toenails 1-5 b/l were debrided in length and girth with sterile nail nippers and dremel without iatrogenic bleeding.  -Callus(es) submet head 5 left foot pared utilizing sterile scalpel blade without complication or incident. Total number debrided =1. -Porokeratotic lesion(s) left foot pared and enucleated with sterile currette without incident. Total number of lesions debrided=1. -Patient/POA to call should there be question/concern in the interim.   Return in about 3 months (around 05/12/2023).  Freddie Breech, DPM

## 2023-02-16 NOTE — H&P (Signed)
Surgical History & Physical  Patient Name: Renee Collins  DOB: 11-Mar-1941  Surgery: Cataract extraction with intraocular lens implant phacoemulsification; Right Eye Surgeon: [name] MD Surgery Date: 02/21/2023 Pre-Op Date: 02/03/2023  HPI: A 43 Yr. old female patient 1. The patient was referred to [place] from Dr. Charise Killian for a cataract evaluation. Since the last visit, the affected area is tolerating. The patient's vision is blurry. The condition's severity is constant. The complaint is associated with difficulty reading small print on medicine bottles/labels, difficulty with glare on bright sunny days, and difficulty reading traffic/street signs. This is negatively affecting the patient's quality of life and the patient is unable to function adequately in life with the current level of vision. The patient does have floaters. HPI was performed by [name] .  Medical History:  Acid reflux, HTN Heart Problem Thyroid Problems  Review of Systems Negative Allergic/Immunologic Cardiovascular Heart problems, HTN Negative Constitutional Negative Ear, Nose, Mouth & Throat Endocrine Thyroid Disease Eyes cataract Gastrointestinal acid reflux Negative Genitourinary Negative Hemotologic/Lymphatic Negative Integumentary Negative Musculoskeletal Negative Neurological Negative Psychiatry Negative Respiratory  Social Never smoked  Medication  Artificial Tears ([facility]),  gabapentin ,  calcitriol ,  amlodipine ,  pravastatin ,  hydrochlorothiazide ,  benazepril ,  bupropion HCl   Sx/Procedures Lamenectomy, Sx on nerve on neck, Hysterectomy  Drug Allergies  NKDA  History & Physical: Heent: cataract NECK: supple without bruits LUNGS: lungs clear to auscultation CV: regular rate and rhythm Abdomen: soft and non-tender  Impression & Plan: Assessment: 1.  COMBINED FORMS AGE RELATED CATARACT; Both Eyes (H25.813) 2.  BLEPHARITIS; Right Upper Lid, Right Lower Lid, Left Upper Lid, Left  Lower Lid (H01.001, H01.002,H01.004,H01.005) 3.  Pinguecula; Both Eyes (H11.153) 4.  ARCUS SENILIS; Both Eyes (H18.413) 5.  ASTIGMATISM, REGULAR; Both Eyes (H52.223)  Plan: 1.  Cataract accounts for the patient's decreased vision. This visual impairment is not correctable with a tolerable change in glasses or contact lenses. Cataract surgery with an implantation of a new lens should significantly improve the visual and functional status of the patient. Discussed all risks, benefits, alternatives, and potential complications. Discussed the procedures and recovery. Patient desires to have surgery. A-scan ordered and performed today for intra-ocular lens calculations. The surgery will be performed in order to improve vision for driving, reading, and for eye examinations. Recommend phacoemulsification with intra-ocular lens. Recommend Dextenza for post-operative pain and inflammation. Right Eye worse - first. Dilates well - shugarcaine by protocol. Recommend toric IOL - Vivity or Eyhance.  2.  Blepharitis is present - recommend regular lid cleaning.  3.  Observe; Artificial tears as needed for irritation.  4.  Discussed significance of finding Answered patient questions about finding  5.  recommend toric IOL OU.

## 2023-02-17 ENCOUNTER — Encounter (HOSPITAL_COMMUNITY)
Admission: RE | Admit: 2023-02-17 | Discharge: 2023-02-17 | Disposition: A | Discharge status: Home / Self Care | Payer: No Typology Code available for payment source | Source: Ambulatory Visit | Attending: Ophthalmology | Admitting: Ophthalmology

## 2023-02-17 NOTE — Pre-Procedure Instructions (Signed)
Attempted pre-op phonecall. Left VM for her to call us back. 

## 2023-02-18 ENCOUNTER — Encounter (HOSPITAL_COMMUNITY): Payer: Self-pay

## 2023-02-18 ENCOUNTER — Other Ambulatory Visit: Payer: Self-pay

## 2023-02-21 ENCOUNTER — Ambulatory Visit (HOSPITAL_COMMUNITY): Payer: No Typology Code available for payment source | Admitting: Anesthesiology

## 2023-02-21 ENCOUNTER — Encounter (HOSPITAL_COMMUNITY)
Admission: RE | Disposition: A | Discharge status: Home / Self Care | Payer: Self-pay | Source: Home / Self Care | Attending: Ophthalmology

## 2023-02-21 ENCOUNTER — Ambulatory Visit (HOSPITAL_COMMUNITY)
Admission: RE | Admit: 2023-02-21 | Discharge: 2023-02-21 | Disposition: A | Discharge status: Home / Self Care | Payer: No Typology Code available for payment source | Attending: Ophthalmology | Admitting: Ophthalmology

## 2023-02-21 ENCOUNTER — Ambulatory Visit (HOSPITAL_BASED_OUTPATIENT_CLINIC_OR_DEPARTMENT_OTHER): Payer: No Typology Code available for payment source | Admitting: Anesthesiology

## 2023-02-21 DIAGNOSIS — I10 Essential (primary) hypertension: Secondary | ICD-10-CM | POA: Diagnosis not present

## 2023-02-21 DIAGNOSIS — H0100B Unspecified blepharitis left eye, upper and lower eyelids: Secondary | ICD-10-CM | POA: Diagnosis not present

## 2023-02-21 DIAGNOSIS — I129 Hypertensive chronic kidney disease with stage 1 through stage 4 chronic kidney disease, or unspecified chronic kidney disease: Secondary | ICD-10-CM

## 2023-02-21 DIAGNOSIS — E1136 Type 2 diabetes mellitus with diabetic cataract: Secondary | ICD-10-CM | POA: Diagnosis not present

## 2023-02-21 DIAGNOSIS — H11153 Pinguecula, bilateral: Secondary | ICD-10-CM | POA: Insufficient documentation

## 2023-02-21 DIAGNOSIS — H52223 Regular astigmatism, bilateral: Secondary | ICD-10-CM | POA: Insufficient documentation

## 2023-02-21 DIAGNOSIS — H25811 Combined forms of age-related cataract, right eye: Secondary | ICD-10-CM | POA: Insufficient documentation

## 2023-02-21 DIAGNOSIS — H0100A Unspecified blepharitis right eye, upper and lower eyelids: Secondary | ICD-10-CM | POA: Diagnosis not present

## 2023-02-21 DIAGNOSIS — F32A Depression, unspecified: Secondary | ICD-10-CM | POA: Diagnosis not present

## 2023-02-21 DIAGNOSIS — N183 Chronic kidney disease, stage 3 unspecified: Secondary | ICD-10-CM | POA: Diagnosis not present

## 2023-02-21 DIAGNOSIS — H18413 Arcus senilis, bilateral: Secondary | ICD-10-CM | POA: Insufficient documentation

## 2023-02-21 HISTORY — PX: CATARACT EXTRACTION W/PHACO: SHX586

## 2023-02-21 LAB — GLUCOSE, CAPILLARY: Glucose-Capillary: 97 mg/dL (ref 70–99)

## 2023-02-21 SURGERY — PHACOEMULSIFICATION, CATARACT, WITH IOL INSERTION
Anesthesia: Monitor Anesthesia Care | Site: Eye | Laterality: Right

## 2023-02-21 MED ORDER — BSS IO SOLN
INTRAOCULAR | Status: DC | PRN
  Administered 2023-02-21: 15 mL via INTRAOCULAR

## 2023-02-21 MED ORDER — POVIDONE-IODINE 5 % OP SOLN
OPHTHALMIC | Status: DC | PRN
  Administered 2023-02-21: 1 via OPHTHALMIC

## 2023-02-21 MED ORDER — EPINEPHRINE PF 1 MG/ML IJ SOLN
INTRAOCULAR | Status: DC | PRN
  Administered 2023-02-21: 500 mL

## 2023-02-21 MED ORDER — MIDAZOLAM HCL 2 MG/2ML IJ SOLN
INTRAMUSCULAR | Status: DC | PRN
  Administered 2023-02-21: 1 mg via INTRAVENOUS

## 2023-02-21 MED ORDER — MOXIFLOXACIN 0.1% OPHTHALMIC SOLUTION (0.1ML)
INJECTION | OPHTHALMIC | Status: DC | PRN
  Administered 2023-02-21: .3 mL via OPHTHALMIC

## 2023-02-21 MED ORDER — SODIUM HYALURONATE 23MG/ML IO SOSY
PREFILLED_SYRINGE | INTRAOCULAR | Status: DC | PRN
  Administered 2023-02-21: .6 mL via INTRAOCULAR

## 2023-02-21 MED ORDER — TETRACAINE HCL 0.5 % OP SOLN
1.0000 [drp] | OPHTHALMIC | Status: AC | PRN
  Administered 2023-02-21 (×3): 1 [drp] via OPHTHALMIC

## 2023-02-21 MED ORDER — LIDOCAINE HCL 3.5 % OP GEL
1.0000 | Freq: Once | OPHTHALMIC | Status: AC
  Administered 2023-02-21: 1 via OPHTHALMIC

## 2023-02-21 MED ORDER — MIDAZOLAM HCL 2 MG/2ML IJ SOLN
INTRAMUSCULAR | Status: AC
  Filled 2023-02-21: qty 2

## 2023-02-21 MED ORDER — SODIUM CHLORIDE 0.9% FLUSH
INTRAVENOUS | Status: DC | PRN
  Administered 2023-02-21: 3 mL via INTRAVENOUS

## 2023-02-21 MED ORDER — PHENYLEPHRINE HCL 2.5 % OP SOLN
1.0000 [drp] | OPHTHALMIC | Status: AC | PRN
  Administered 2023-02-21 (×3): 1 [drp] via OPHTHALMIC

## 2023-02-21 MED ORDER — TROPICAMIDE 1 % OP SOLN
1.0000 [drp] | OPHTHALMIC | Status: AC | PRN
  Administered 2023-02-21 (×3): 1 [drp] via OPHTHALMIC

## 2023-02-21 MED ORDER — STERILE WATER FOR IRRIGATION IR SOLN
Status: DC | PRN
  Administered 2023-02-21: 250 mL

## 2023-02-21 MED ORDER — LIDOCAINE HCL (PF) 1 % IJ SOLN
INTRAOCULAR | Status: DC | PRN
  Administered 2023-02-21: 1 mL via OPHTHALMIC

## 2023-02-21 MED ORDER — SODIUM HYALURONATE 10 MG/ML IO SOLUTION
PREFILLED_SYRINGE | INTRAOCULAR | Status: DC | PRN
  Administered 2023-02-21: .85 mL via INTRAOCULAR

## 2023-02-21 SURGICAL SUPPLY — 13 items
CATARACT SUITE SIGHTPATH (MISCELLANEOUS) ×1
CLOTH BEACON ORANGE TIMEOUT ST (SAFETY) ×1 IMPLANT
EYE SHIELD UNIVERSAL CLEAR (GAUZE/BANDAGES/DRESSINGS) IMPLANT
FEE CATARACT SUITE SIGHTPATH (MISCELLANEOUS) ×1 IMPLANT
GLOVE BIOGEL PI IND STRL 7.0 (GLOVE) ×2 IMPLANT
LENS IOL TECNIS EYHANCE 22.0 (Intraocular Lens) IMPLANT
NDL HYPO 18GX1.5 BLUNT FILL (NEEDLE) ×1 IMPLANT
NEEDLE HYPO 18GX1.5 BLUNT FILL (NEEDLE) ×1
PAD ARMBOARD 7.5X6 YLW CONV (MISCELLANEOUS) ×1 IMPLANT
POSITIONER HEAD 8X9X4 ADT (SOFTGOODS) ×1 IMPLANT
SYR TB 1ML LL NO SAFETY (SYRINGE) ×1 IMPLANT
TAPE SURG TRANSPORE 1 IN (GAUZE/BANDAGES/DRESSINGS) IMPLANT
WATER STERILE IRR 250ML POUR (IV SOLUTION) ×1 IMPLANT

## 2023-02-21 NOTE — Anesthesia Preprocedure Evaluation (Signed)
Anesthesia Evaluation  Patient identified by MRN, date of birth, ID band Patient awake    Reviewed: Allergy & Precautions, H&P , NPO status , Patient's Chart, lab work & pertinent test results, reviewed documented beta blocker date and time   Airway Mallampati: II  TM Distance: >3 FB Neck ROM: full    Dental no notable dental hx.    Pulmonary neg pulmonary ROS   Pulmonary exam normal breath sounds clear to auscultation       Cardiovascular Exercise Tolerance: Good hypertension, negative cardio ROS + Valvular Problems/Murmurs  Rhythm:regular Rate:Normal     Neuro/Psych  PSYCHIATRIC DISORDERS  Depression     Neuromuscular disease negative neurological ROS  negative psych ROS   GI/Hepatic negative GI ROS, Neg liver ROS,GERD  ,,  Endo/Other  negative endocrine ROSdiabetes, Type 2Hypothyroidism    Renal/GU Renal diseasenegative Renal ROS  negative genitourinary   Musculoskeletal   Abdominal   Peds  Hematology negative hematology ROS (+) Blood dyscrasia, anemia   Anesthesia Other Findings   Reproductive/Obstetrics negative OB ROS                             Anesthesia Physical Anesthesia Plan  ASA: 3  Anesthesia Plan: MAC   Post-op Pain Management:    Induction:   PONV Risk Score and Plan:   Airway Management Planned:   Additional Equipment:   Intra-op Plan:   Post-operative Plan:   Informed Consent: I have reviewed the patients History and Physical, chart, labs and discussed the procedure including the risks, benefits and alternatives for the proposed anesthesia with the patient or authorized representative who has indicated his/her understanding and acceptance.     Dental Advisory Given  Plan Discussed with: CRNA  Anesthesia Plan Comments:        Anesthesia Quick Evaluation

## 2023-02-21 NOTE — Interval H&P Note (Signed)
History and Physical Interval Note:  02/21/2023 7:44 AM  Renee Collins  has presented today for surgery, with the diagnosis of combined forms age related cataract, right eye.  The various methods of treatment have been discussed with the patient and family. After consideration of risks, benefits and other options for treatment, the patient has consented to  Procedure(s): CATARACT EXTRACTION PHACO AND INTRAOCULAR LENS PLACEMENT (IOC) (Right) as a surgical intervention.  The patient's history has been reviewed, patient examined, no change in status, stable for surgery.  I have reviewed the patient's chart and labs.  Questions were answered to the patient's satisfaction.     [name]

## 2023-02-21 NOTE — Discharge Instructions (Signed)
Please discharge patient when stable, will follow up today with Dr. Wrzosek at the [facility] [place] office immediately following discharge.  Leave shield in place until visit.  All paperwork with discharge instructions will be given at the office.  [facility] Address:  [address]  [place], [place] [address]  

## 2023-02-21 NOTE — Anesthesia Procedure Notes (Signed)
Date/Time: 02/21/2023 7:51 AM  Performed by: Franco Nones, CRNAPre-anesthesia Checklist: Patient identified, Emergency Drugs available, Suction available, Timeout performed and Patient being monitored Patient Re-evaluated:Patient Re-evaluated prior to induction Oxygen Delivery Method: Nasal Cannula

## 2023-02-21 NOTE — Transfer of Care (Signed)
Immediate Anesthesia Transfer of Care Note  Patient: Renee Collins  Procedure(s) Performed: CATARACT EXTRACTION PHACO AND INTRAOCULAR LENS PLACEMENT (IOC) (Right: Eye)  Patient Location: Short stay  Anesthesia Type:MAC  Level of Consciousness: awake and patient cooperative  Airway & Oxygen Therapy: Patient Spontanous Breathing  Post-op Assessment: Report given to RN and Post -op Vital signs reviewed and stable  Post vital signs: Reviewed and stable  Last Vitals:  Vitals Value Taken Time  BP 123/80 02/21/23 0815  Temp 98 02/21/23  0815  Pulse 88 02/21/23  0815  Resp 18 02/21/23  0815  SpO2 99 02/21/23  0815    Last Pain:  Vitals:   02/21/23 0702  TempSrc: Oral  PainSc: 0-No pain      Patients Stated Pain Goal: 5 (02/21/23 0702)  Complications: No notable events documented.

## 2023-02-21 NOTE — Op Note (Signed)
Date of procedure: 02/21/23  Pre-operative diagnosis:  Visually significant combined form age-related cataract, Right Eye (H25.811)  Post-operative diagnosis:  Visually significant combined form age-related cataract, Right Eye (H25.811)  Procedure: Removal of cataract via phacoemulsification and insertion of intra-ocular lens [employer] DIB00 +22.0D into the capsular bag of the Right Eye  Attending surgeon: Rudy Jew. Christene Pounds, MD, MA  Anesthesia: MAC, Topical Akten  Complications: None  Estimated Blood Loss: <50mL (minimal)  Specimens: None  Implants: As above  Indications:  Visually significant age-related cataract, Right Eye  Procedure:  The patient was seen and identified in the pre-operative area. The operative eye was identified and dilated.  The operative eye was marked.  Topical anesthesia was administered to the operative eye.     The patient was then to the operative suite and placed in the supine position.  A timeout was performed confirming the patient, procedure to be performed, and all other relevant information.   The patient's face was prepped and draped in the usual fashion for intra-ocular surgery.  A lid speculum was placed into the operative eye and the surgical microscope moved into place and focused.  A superotemporal paracentesis was created using a 20 gauge paracentesis blade.  Shugarcaine was injected into the anterior chamber.  Viscoelastic was injected into the anterior chamber.  A temporal clear-corneal main wound incision was created using a 2.70mm microkeratome.  A continuous curvilinear capsulorrhexis was initiated using an irrigating cystitome and completed using capsulorrhexis forceps.  Hydrodissection and hydrodeliniation were performed.  Viscoelastic was injected into the anterior chamber.  A phacoemulsification handpiece and a chopper as a second instrument were used to remove the nucleus and epinucleus. The irrigation/aspiration handpiece was used to  remove any remaining cortical material.   The capsular bag was reinflated with viscoelastic, checked, and found to be intact.  The intraocular lens was inserted into the capsular bag.  The irrigation/aspiration handpiece was used to remove any remaining viscoelastic.  The clear corneal wound and paracentesis wounds were then hydrated and checked with Weck-Cels to be watertight. 0.81mL of Moxfloxacin was injected into the anterior chamber. The lid-speculum was removed.  The drape was removed.  The patient's face was cleaned with a wet and dry 4x4. A clear shield was taped over the eye. The patient was taken to the post-operative care unit in good condition, having tolerated the procedure well.  Post-Op Instructions: The patient will follow up at [facility] for a same day post-operative evaluation and will receive all other orders and instructions.

## 2023-02-22 ENCOUNTER — Encounter (HOSPITAL_COMMUNITY): Payer: Self-pay | Admitting: Ophthalmology

## 2023-02-22 NOTE — Anesthesia Postprocedure Evaluation (Signed)
Anesthesia Post Note  Patient: DAHLILA HRIVNAK  Procedure(s) Performed: CATARACT EXTRACTION PHACO AND INTRAOCULAR LENS PLACEMENT (IOC) (Right: Eye)  Patient location during evaluation: Phase II Anesthesia Type: MAC Level of consciousness: awake Pain management: pain level controlled Vital Signs Assessment: post-procedure vital signs reviewed and stable Respiratory status: spontaneous breathing and respiratory function stable Cardiovascular status: blood pressure returned to baseline and stable Postop Assessment: no headache and no apparent nausea or vomiting Anesthetic complications: no Comments: Late entry   No notable events documented.   Last Vitals:  Vitals:   02/21/23 0702 02/21/23 0813  BP: 130/68 127/76  Pulse: 83 82  Resp: 18 16  Temp: 36.7 C 36.7 C  SpO2: 100% 100%    Last Pain:  Vitals:   02/21/23 0813  TempSrc: Oral  PainSc: 0-No pain                 Windell Norfolk

## 2023-02-24 DIAGNOSIS — H25812 Combined forms of age-related cataract, left eye: Secondary | ICD-10-CM | POA: Diagnosis not present

## 2023-02-24 DIAGNOSIS — R809 Proteinuria, unspecified: Secondary | ICD-10-CM | POA: Diagnosis not present

## 2023-02-24 DIAGNOSIS — E1129 Type 2 diabetes mellitus with other diabetic kidney complication: Secondary | ICD-10-CM | POA: Diagnosis not present

## 2023-02-24 DIAGNOSIS — I129 Hypertensive chronic kidney disease with stage 1 through stage 4 chronic kidney disease, or unspecified chronic kidney disease: Secondary | ICD-10-CM | POA: Diagnosis not present

## 2023-02-24 DIAGNOSIS — E211 Secondary hyperparathyroidism, not elsewhere classified: Secondary | ICD-10-CM | POA: Diagnosis not present

## 2023-02-24 DIAGNOSIS — D638 Anemia in other chronic diseases classified elsewhere: Secondary | ICD-10-CM | POA: Diagnosis not present

## 2023-02-24 DIAGNOSIS — N189 Chronic kidney disease, unspecified: Secondary | ICD-10-CM | POA: Diagnosis not present

## 2023-03-02 DIAGNOSIS — N184 Chronic kidney disease, stage 4 (severe): Secondary | ICD-10-CM | POA: Diagnosis not present

## 2023-03-02 DIAGNOSIS — E1129 Type 2 diabetes mellitus with other diabetic kidney complication: Secondary | ICD-10-CM | POA: Diagnosis not present

## 2023-03-02 DIAGNOSIS — R809 Proteinuria, unspecified: Secondary | ICD-10-CM | POA: Diagnosis not present

## 2023-03-02 DIAGNOSIS — D472 Monoclonal gammopathy: Secondary | ICD-10-CM | POA: Diagnosis not present

## 2023-03-02 NOTE — H&P (Signed)
Surgical History & Physical  Patient Name: Renee Collins  DOB: 1940-12-23  Surgery: Cataract extraction with intraocular lens implant phacoemulsification; Left Eye Surgeon: [name] MD Surgery Date: 03/07/2023 Pre-Op Date: 02/24/2023  HPI: A 25 Yr. old female patient 1. The patient is returning after cataract surgery. The right eye is affected. Status post cataract surgery, which began 3 days ago: Since the last visit, the affected area is doing well. The patient's vision is improved. Patient is following medication instructions. 2. 2. The patient is returning for a cataract follow-up of the left eye. Since the last visit, the affected area is tolerating. The patient's vision is blurry. The condition's severity is constant. The complaint is associated with difficulty reading small print on medicine bottles/labels, difficulty with poor night vision, and difficulty driving at night due to halos/glare. This is negatively affecting the patient's quality of life and the patient is unable to function adequately in life with the current level of vision. HPI Completed by Dr. [name]  Medical History:  Acid reflux, HTN Heart Problem Thyroid Problems  Review of Systems Negative Allergic/Immunologic HTN, heart problems Cardiovascular Negative Constitutional Negative Ear, Nose, Mouth & Throat Thyroid problems Endocrine Cataract Eyes Acid Reflux Gastrointestinal Negative Genitourinary Negative Hemotologic/Lymphatic Negative Integumentary Negative Musculoskeletal Negative Neurological Negative Psychiatry Negative Respiratory  Social Never smoked   Medication Artificial Tears ([facility]), Prednisolone-Moxifloxacin-Bromfenac,  gabapentin ,  calcitriol ,  amlodipine ,  pravastatin ,  hydrochlorothiazide ,  benazepril ,  bupropion HCl   Sx/Procedures Phaco c IOL OD,  Lamenectomy, Sx on nerve on neck, Hysterectomy  Drug Allergies  NKDA  History & Physical: Heent: cataract NECK: supple  without bruits LUNGS: lungs clear to auscultation CV: regular rate and rhythm Abdomen: soft and non-tender  Impression & Plan: Assessment: 1.  CATARACT EXTRACTION STATUS; Right Eye (Z98.41) 2.  COMBINED FORMS AGE RELATED CATARACT; Both Eyes (H25.813) 3.  INTRAOCULAR LENS IOL ; Right Eye (Z96.1)  Plan: 1.  3 days after cataract surgery. Doing well with improved vision and normal eye pressure. Call with any problems or concerns. Continue Pred-Moxi-Brom 3x/day for 4 more days and then 2x/day for 3 more weeks.  2.  Cataract accounts for the patient's decreased vision. This visual impairment is not correctable with a tolerable change in glasses or contact lenses. Cataract surgery with an implantation of a new lens should significantly improve the visual and functional status of the patient. Discussed all risks, benefits, alternatives, and potential complications. Discussed the procedures and recovery. Patient desires to have surgery. A-scan ordered and performed today for intra-ocular lens calculations. The surgery will be performed in order to improve vision for driving, reading, and for eye examinations. Recommend phacoemulsification with intra-ocular lens. Recommend Dextenza for post-operative pain and inflammation. Left Eye. Surgery required to correct imbalance of vision. Dilates well - shugarcaine by protocol.  3.  Doing well since surgery Continue Post-op medications

## 2023-03-03 ENCOUNTER — Other Ambulatory Visit: Payer: Self-pay | Admitting: Family Medicine

## 2023-03-03 ENCOUNTER — Encounter (HOSPITAL_COMMUNITY)
Admission: RE | Admit: 2023-03-03 | Discharge: 2023-03-03 | Disposition: A | Discharge status: Home / Self Care | Payer: No Typology Code available for payment source | Source: Ambulatory Visit | Attending: Ophthalmology | Admitting: Ophthalmology

## 2023-03-03 ENCOUNTER — Encounter (HOSPITAL_COMMUNITY): Payer: Self-pay

## 2023-03-03 DIAGNOSIS — E1149 Type 2 diabetes mellitus with other diabetic neurological complication: Secondary | ICD-10-CM

## 2023-03-07 ENCOUNTER — Encounter (HOSPITAL_COMMUNITY): Payer: Self-pay | Admitting: Ophthalmology

## 2023-03-07 ENCOUNTER — Ambulatory Visit (HOSPITAL_COMMUNITY)
Admission: RE | Admit: 2023-03-07 | Discharge: 2023-03-07 | Disposition: A | Discharge status: Home / Self Care | Payer: No Typology Code available for payment source | Attending: Ophthalmology | Admitting: Ophthalmology

## 2023-03-07 ENCOUNTER — Ambulatory Visit (HOSPITAL_COMMUNITY): Payer: No Typology Code available for payment source | Admitting: Certified Registered Nurse Anesthetist

## 2023-03-07 ENCOUNTER — Encounter (HOSPITAL_COMMUNITY)
Admission: RE | Disposition: A | Discharge status: Home / Self Care | Payer: Self-pay | Source: Home / Self Care | Attending: Ophthalmology

## 2023-03-07 DIAGNOSIS — F32A Depression, unspecified: Secondary | ICD-10-CM | POA: Insufficient documentation

## 2023-03-07 DIAGNOSIS — H25812 Combined forms of age-related cataract, left eye: Secondary | ICD-10-CM | POA: Diagnosis not present

## 2023-03-07 DIAGNOSIS — Z961 Presence of intraocular lens: Secondary | ICD-10-CM | POA: Insufficient documentation

## 2023-03-07 DIAGNOSIS — E039 Hypothyroidism, unspecified: Secondary | ICD-10-CM | POA: Diagnosis not present

## 2023-03-07 DIAGNOSIS — E1136 Type 2 diabetes mellitus with diabetic cataract: Secondary | ICD-10-CM

## 2023-03-07 DIAGNOSIS — Z9841 Cataract extraction status, right eye: Secondary | ICD-10-CM | POA: Insufficient documentation

## 2023-03-07 DIAGNOSIS — I10 Essential (primary) hypertension: Secondary | ICD-10-CM | POA: Insufficient documentation

## 2023-03-07 HISTORY — PX: CATARACT EXTRACTION W/PHACO: SHX586

## 2023-03-07 SURGERY — PHACOEMULSIFICATION, CATARACT, WITH IOL INSERTION
Anesthesia: Monitor Anesthesia Care | Site: Eye | Laterality: Left

## 2023-03-07 MED ORDER — TROPICAMIDE 1 % OP SOLN
1.0000 [drp] | OPHTHALMIC | Status: AC | PRN
  Administered 2023-03-07 (×3): 1 [drp] via OPHTHALMIC

## 2023-03-07 MED ORDER — EPINEPHRINE PF 1 MG/ML IJ SOLN
INTRAOCULAR | Status: DC | PRN
  Administered 2023-03-07: 500 mL

## 2023-03-07 MED ORDER — MIDAZOLAM HCL 2 MG/2ML IJ SOLN
INTRAMUSCULAR | Status: AC
  Filled 2023-03-07: qty 2

## 2023-03-07 MED ORDER — MIDAZOLAM HCL 2 MG/2ML IJ SOLN
INTRAMUSCULAR | Status: DC | PRN
  Administered 2023-03-07: 1 mg via INTRAVENOUS

## 2023-03-07 MED ORDER — LIDOCAINE HCL 3.5 % OP GEL
1.0000 | Freq: Once | OPHTHALMIC | Status: AC
  Administered 2023-03-07: 1 via OPHTHALMIC
  Filled 2023-03-07: qty 1

## 2023-03-07 MED ORDER — STERILE WATER FOR IRRIGATION IR SOLN
Status: DC | PRN
  Administered 2023-03-07: 250 mL

## 2023-03-07 MED ORDER — MOXIFLOXACIN HCL 5 MG/ML IO SOLN
INTRAOCULAR | Status: DC | PRN
  Administered 2023-03-07: .2 mL via OPHTHALMIC

## 2023-03-07 MED ORDER — LACTATED RINGERS IV SOLN: INTRAVENOUS | Status: DC

## 2023-03-07 MED ORDER — BSS IO SOLN
INTRAOCULAR | Status: DC | PRN
  Administered 2023-03-07: 15 mL via INTRAOCULAR

## 2023-03-07 MED ORDER — PHENYLEPHRINE HCL 2.5 % OP SOLN
1.0000 [drp] | OPHTHALMIC | Status: AC | PRN
  Administered 2023-03-07 (×3): 1 [drp] via OPHTHALMIC

## 2023-03-07 MED ORDER — POVIDONE-IODINE 5 % OP SOLN
OPHTHALMIC | Status: DC | PRN
  Administered 2023-03-07: 1 via OPHTHALMIC

## 2023-03-07 MED ORDER — SODIUM HYALURONATE 23MG/ML IO SOSY
PREFILLED_SYRINGE | INTRAOCULAR | Status: DC | PRN
  Administered 2023-03-07: .6 mL via INTRAOCULAR

## 2023-03-07 MED ORDER — LIDOCAINE HCL (PF) 1 % IJ SOLN
INTRAOCULAR | Status: DC | PRN
  Administered 2023-03-07: 1 mL via OPHTHALMIC

## 2023-03-07 MED ORDER — TETRACAINE HCL 0.5 % OP SOLN
1.0000 [drp] | OPHTHALMIC | Status: AC | PRN
  Administered 2023-03-07 (×3): 1 [drp] via OPHTHALMIC

## 2023-03-07 MED ORDER — SODIUM HYALURONATE 10 MG/ML IO SOLUTION
PREFILLED_SYRINGE | INTRAOCULAR | Status: DC | PRN
  Administered 2023-03-07: .85 mL via INTRAOCULAR

## 2023-03-07 SURGICAL SUPPLY — 13 items
CATARACT SUITE SIGHTPATH (MISCELLANEOUS) ×1
CLOTH BEACON ORANGE TIMEOUT ST (SAFETY) ×1 IMPLANT
EYE SHIELD UNIVERSAL CLEAR (GAUZE/BANDAGES/DRESSINGS) IMPLANT
FEE CATARACT SUITE SIGHTPATH (MISCELLANEOUS) ×1 IMPLANT
GLOVE BIOGEL PI IND STRL 7.0 (GLOVE) ×2 IMPLANT
LENS IOL TECNIS EYHANCE 22.5 (Intraocular Lens) IMPLANT
NDL HYPO 18GX1.5 BLUNT FILL (NEEDLE) ×1 IMPLANT
NEEDLE HYPO 18GX1.5 BLUNT FILL (NEEDLE) ×1
PAD ARMBOARD 7.5X6 YLW CONV (MISCELLANEOUS) ×1 IMPLANT
POSITIONER HEAD 8X9X4 ADT (SOFTGOODS) ×1 IMPLANT
SYR TB 1ML LL NO SAFETY (SYRINGE) ×1 IMPLANT
TAPE SURG TRANSPORE 1 IN (GAUZE/BANDAGES/DRESSINGS) IMPLANT
WATER STERILE IRR 250ML POUR (IV SOLUTION) ×1 IMPLANT

## 2023-03-07 NOTE — Interval H&P Note (Signed)
History and Physical Interval Note:  03/07/2023 10:16 AM  [name]  has presented today for surgery, with the diagnosis of combined forms age related cataract, left eye.  The various methods of treatment have been discussed with the patient and family. After consideration of risks, benefits and other options for treatment, the patient has consented to  Procedure(s) with comments: CATARACT EXTRACTION PHACO AND INTRAOCULAR LENS PLACEMENT (IOC) (Left) - CDE: as a surgical intervention.  The patient's history has been reviewed, patient examined, no change in status, stable for surgery.  I have reviewed the patient's chart and labs.  Questions were answered to the patient's satisfaction.     Renee Collins

## 2023-03-07 NOTE — Discharge Instructions (Signed)
Please discharge patient when stable, will follow up today with Dr. Wrzosek at the [facility] [place] office immediately following discharge.  Leave shield in place until visit.  All paperwork with discharge instructions will be given at the office.  [facility] Address:  [address]  [place], [place] [address]  

## 2023-03-07 NOTE — Anesthesia Preprocedure Evaluation (Signed)
Anesthesia Evaluation  Patient identified by MRN, date of birth, ID band Patient awake    Reviewed: Allergy & Precautions, H&P , NPO status , Patient's Chart, lab work & pertinent test results, reviewed documented beta blocker date and time   Airway Mallampati: II  TM Distance: >3 FB Neck ROM: full    Dental no notable dental hx.    Pulmonary neg pulmonary ROS   Pulmonary exam normal breath sounds clear to auscultation       Cardiovascular Exercise Tolerance: Good hypertension, negative cardio ROS + Valvular Problems/Murmurs  Rhythm:regular Rate:Normal     Neuro/Psych  PSYCHIATRIC DISORDERS  Depression     Neuromuscular disease negative neurological ROS  negative psych ROS   GI/Hepatic negative GI ROS, Neg liver ROS,GERD  ,,  Endo/Other  negative endocrine ROSdiabetes, Type 2Hypothyroidism    Renal/GU Renal diseasenegative Renal ROS  negative genitourinary   Musculoskeletal   Abdominal   Peds  Hematology negative hematology ROS (+) Blood dyscrasia, anemia   Anesthesia Other Findings   Reproductive/Obstetrics negative OB ROS                             Anesthesia Physical Anesthesia Plan  ASA: 3  Anesthesia Plan: MAC   Post-op Pain Management:    Induction:   PONV Risk Score and Plan:   Airway Management Planned:   Additional Equipment:   Intra-op Plan:   Post-operative Plan:   Informed Consent: I have reviewed the patients History and Physical, chart, labs and discussed the procedure including the risks, benefits and alternatives for the proposed anesthesia with the patient or authorized representative who has indicated his/her understanding and acceptance.     Dental Advisory Given  Plan Discussed with: CRNA  Anesthesia Plan Comments:        Anesthesia Quick Evaluation

## 2023-03-07 NOTE — Transfer of Care (Signed)
Immediate Anesthesia Transfer of Care Note  Patient: Renee Collins  Procedure(s) Performed: CATARACT EXTRACTION PHACO AND INTRAOCULAR LENS PLACEMENT (IOC) (Left: Eye)  Patient Location: Short Stay  Anesthesia Type:MAC  Level of Consciousness: awake, alert , and oriented  Airway & Oxygen Therapy: Patient Spontanous Breathing  Post-op Assessment: Report given to RN and Post -op Vital signs reviewed and stable  Post vital signs: Reviewed and stable  Last Vitals:  Vitals Value Taken Time  BP 122/73 03/07/23 1039  Temp 36.7 C 03/07/23 1039  Pulse 78 03/07/23 1039  Resp 16 03/07/23 1039  SpO2 99 % 03/07/23 1039    Last Pain:  Vitals:   03/07/23 0928  PainSc: 0-No pain         Complications: No notable events documented.

## 2023-03-07 NOTE — Anesthesia Postprocedure Evaluation (Signed)
Anesthesia Post Note  Patient: Renee Collins  Procedure(s) Performed: CATARACT EXTRACTION PHACO AND INTRAOCULAR LENS PLACEMENT (IOC) (Left: Eye)  Patient location during evaluation: Phase II Anesthesia Type: MAC Level of consciousness: awake Pain management: pain level controlled Vital Signs Assessment: post-procedure vital signs reviewed and stable Respiratory status: spontaneous breathing and respiratory function stable Cardiovascular status: blood pressure returned to baseline and stable Postop Assessment: no headache and no apparent nausea or vomiting Anesthetic complications: no Comments: Late entry   No notable events documented.   Last Vitals:  Vitals:   03/07/23 0945 03/07/23 1039  BP:  122/73  Pulse: 75 78  Resp: 15 16  Temp:  36.7 C  SpO2: 100% 99%    Last Pain:  Vitals:   03/07/23 1039  PainSc: 0-No pain                 Windell Norfolk

## 2023-03-07 NOTE — Op Note (Signed)
Date of procedure: 03/07/23  Pre-operative diagnosis: Visually significant age-related combined cataract, Left Eye (H25.812)  Post-operative diagnosis: Visually significant age-related combined cataract, Left Eye (H25.812)  Procedure: Removal of cataract via phacoemulsification and insertion of intra-ocular lens Laural Benes and Johnson DIB00 +22.5D into the capsular bag of the Left Eye  Attending surgeon: Rudy Jew. Iokepa Geffre, MD, MA  Anesthesia: MAC, Topical Akten  Complications: None  Estimated Blood Loss: <15mL (minimal)  Specimens: None  Implants: As above  Indications:  Visually significant age-related cataract, Left Eye  Procedure:  The patient was seen and identified in the pre-operative area. The operative eye was identified and dilated.  The operative eye was marked.  Topical anesthesia was administered to the operative eye.     The patient was then to the operative suite and placed in the supine position.  A timeout was performed confirming the patient, procedure to be performed, and all other relevant information.   The patient's face was prepped and draped in the usual fashion for intra-ocular surgery.  A lid speculum was placed into the operative eye and the surgical microscope moved into place and focused.  An inferotemporal paracentesis was created using a 20 gauge paracentesis blade.  Shugarcaine was injected into the anterior chamber.  Viscoelastic was injected into the anterior chamber.  A temporal clear-corneal main wound incision was created using a 2.44mm microkeratome.  A continuous curvilinear capsulorrhexis was initiated using an irrigating cystitome and completed using capsulorrhexis forceps.  Hydrodissection and hydrodeliniation were performed.  Viscoelastic was injected into the anterior chamber.  A phacoemulsification handpiece and a chopper as a second instrument were used to remove the nucleus and epinucleus. The irrigation/aspiration handpiece was used to remove any  remaining cortical material.   The capsular bag was reinflated with viscoelastic, checked, and found to be intact.  The intraocular lens was inserted into the capsular bag.  The irrigation/aspiration handpiece was used to remove any remaining viscoelastic.  The clear corneal wound and paracentesis wounds were then hydrated and checked with Weck-Cels to be watertight. 0.64mL of Moxfloxacin was injected into the anterior chamber. The lid-speculum was removed.  The drape was removed.  The patient's face was cleaned with a wet and dry 4x4.    A clear shield was taped over the eye. The patient was taken to the post-operative care unit in good condition, having tolerated the procedure well.  Post-Op Instructions: The patient will follow up at [facility] for a same day post-operative evaluation and will receive all other orders and instructions.

## 2023-03-08 ENCOUNTER — Encounter (HOSPITAL_COMMUNITY): Payer: Self-pay | Admitting: Ophthalmology

## 2023-03-21 ENCOUNTER — Encounter: Payer: Self-pay | Admitting: Family Medicine

## 2023-03-22 ENCOUNTER — Other Ambulatory Visit: Payer: Self-pay

## 2023-03-22 DIAGNOSIS — E041 Nontoxic single thyroid nodule: Secondary | ICD-10-CM

## 2023-03-22 MED ORDER — LEVOTHYROXINE SODIUM 88 MCG PO TABS: ORAL_TABLET | ORAL | 2 refills | Status: DC

## 2023-04-05 DIAGNOSIS — N2581 Secondary hyperparathyroidism of renal origin: Secondary | ICD-10-CM | POA: Diagnosis not present

## 2023-04-05 DIAGNOSIS — K219 Gastro-esophageal reflux disease without esophagitis: Secondary | ICD-10-CM | POA: Diagnosis not present

## 2023-04-05 DIAGNOSIS — G4700 Insomnia, unspecified: Secondary | ICD-10-CM | POA: Diagnosis not present

## 2023-04-05 DIAGNOSIS — E663 Overweight: Secondary | ICD-10-CM | POA: Diagnosis not present

## 2023-04-05 DIAGNOSIS — N184 Chronic kidney disease, stage 4 (severe): Secondary | ICD-10-CM | POA: Diagnosis not present

## 2023-04-05 DIAGNOSIS — F3341 Major depressive disorder, recurrent, in partial remission: Secondary | ICD-10-CM | POA: Diagnosis not present

## 2023-04-05 DIAGNOSIS — I129 Hypertensive chronic kidney disease with stage 1 through stage 4 chronic kidney disease, or unspecified chronic kidney disease: Secondary | ICD-10-CM | POA: Diagnosis not present

## 2023-04-05 DIAGNOSIS — Z6829 Body mass index (BMI) 29.0-29.9, adult: Secondary | ICD-10-CM | POA: Diagnosis not present

## 2023-04-05 DIAGNOSIS — Z008 Encounter for other general examination: Secondary | ICD-10-CM | POA: Diagnosis not present

## 2023-04-05 DIAGNOSIS — E039 Hypothyroidism, unspecified: Secondary | ICD-10-CM | POA: Diagnosis not present

## 2023-04-05 DIAGNOSIS — E785 Hyperlipidemia, unspecified: Secondary | ICD-10-CM | POA: Diagnosis not present

## 2023-04-05 DIAGNOSIS — I700 Atherosclerosis of aorta: Secondary | ICD-10-CM | POA: Diagnosis not present

## 2023-04-14 ENCOUNTER — Other Ambulatory Visit (HOSPITAL_COMMUNITY): Payer: Self-pay | Admitting: Family Medicine

## 2023-04-14 ENCOUNTER — Ambulatory Visit (INDEPENDENT_AMBULATORY_CARE_PROVIDER_SITE_OTHER): Payer: No Typology Code available for payment source | Admitting: Family Medicine

## 2023-04-14 ENCOUNTER — Encounter: Payer: Self-pay | Admitting: Family Medicine

## 2023-04-14 VITALS — BP 139/74 | HR 84 | Resp 16 | Ht 66.0 in | Wt 179.0 lb

## 2023-04-14 DIAGNOSIS — E039 Hypothyroidism, unspecified: Secondary | ICD-10-CM

## 2023-04-14 DIAGNOSIS — E1149 Type 2 diabetes mellitus with other diabetic neurological complication: Secondary | ICD-10-CM | POA: Diagnosis not present

## 2023-04-14 DIAGNOSIS — I10 Essential (primary) hypertension: Secondary | ICD-10-CM | POA: Diagnosis not present

## 2023-04-14 DIAGNOSIS — Z1211 Encounter for screening for malignant neoplasm of colon: Secondary | ICD-10-CM

## 2023-04-14 DIAGNOSIS — F324 Major depressive disorder, single episode, in partial remission: Secondary | ICD-10-CM

## 2023-04-14 DIAGNOSIS — E785 Hyperlipidemia, unspecified: Secondary | ICD-10-CM | POA: Diagnosis not present

## 2023-04-14 DIAGNOSIS — Z23 Encounter for immunization: Secondary | ICD-10-CM | POA: Diagnosis not present

## 2023-04-14 DIAGNOSIS — D472 Monoclonal gammopathy: Secondary | ICD-10-CM | POA: Diagnosis not present

## 2023-04-14 DIAGNOSIS — R634 Abnormal weight loss: Secondary | ICD-10-CM | POA: Diagnosis not present

## 2023-04-14 DIAGNOSIS — Z1231 Encounter for screening mammogram for malignant neoplasm of breast: Secondary | ICD-10-CM

## 2023-04-14 NOTE — Patient Instructions (Addendum)
F/u in 7 to 8 weeks re evaluate weight  and health, call if you need me sooner  Flu vaccine today  Pls schedule  January mammogram at checkout  Labs today, cmp and eGFR, hBA1C and TSH  Intentionally eat more often to maintain weight   Foot exam shows reduced sensation and vibration sense examine feet every day  Need to follow through with colonoscopy  Add melatonin 1mg  to benadryl to help with sleep  Thanks for choosing [facility], we consider it a privelige to serve you.

## 2023-04-15 LAB — HEMOGLOBIN A1C
Est. average glucose Bld gHb Est-mCnc: 143 mg/dL
Hgb A1c MFr Bld: 6.6 % — ABNORMAL HIGH (ref 4.8–5.6)

## 2023-04-15 LAB — CMP14+EGFR
ALT: 14 [IU]/L (ref 0–32)
AST: 18 [IU]/L (ref 0–40)
Albumin: 4.2 g/dL (ref 3.7–4.7)
Alkaline Phosphatase: 75 [IU]/L (ref 44–121)
BUN/Creatinine Ratio: 17 (ref 12–28)
BUN: 28 mg/dL — ABNORMAL HIGH (ref 8–27)
Bilirubin Total: 0.4 mg/dL (ref 0.0–1.2)
CO2: 21 mmol/L (ref 20–29)
Calcium: 9.9 mg/dL (ref 8.7–10.3)
Chloride: 103 mmol/L (ref 96–106)
Creatinine, Ser: 1.69 mg/dL — ABNORMAL HIGH (ref 0.57–1.00)
Globulin, Total: 4.3 g/dL (ref 1.5–4.5)
Glucose: 99 mg/dL (ref 70–99)
Potassium: 4 mmol/L (ref 3.5–5.2)
Sodium: 138 mmol/L (ref 134–144)
Total Protein: 8.5 g/dL (ref 6.0–8.5)
eGFR: 30 mL/min/{1.73_m2} — ABNORMAL LOW (ref >59)

## 2023-04-15 LAB — TSH: TSH: 3 u[IU]/mL (ref 0.450–4.500)

## 2023-04-18 ENCOUNTER — Encounter (INDEPENDENT_AMBULATORY_CARE_PROVIDER_SITE_OTHER): Payer: Self-pay | Admitting: *Deleted

## 2023-04-18 ENCOUNTER — Encounter: Payer: Self-pay | Admitting: Family Medicine

## 2023-04-18 DIAGNOSIS — R634 Abnormal weight loss: Secondary | ICD-10-CM | POA: Insufficient documentation

## 2023-04-18 DIAGNOSIS — Z23 Encounter for immunization: Secondary | ICD-10-CM | POA: Insufficient documentation

## 2023-04-18 NOTE — Assessment & Plan Note (Signed)
Hyperlipidemia:Low fat diet discussed and encouraged.   Lipid Panel  Lab Results  Component Value Date   CHOL 139 12/10/2022   HDL 64 12/10/2022   LDLCALC 61 12/10/2022   TRIG 69 12/10/2022   CHOLHDL 2.2 12/10/2022     Controlled, no change in medication

## 2023-04-18 NOTE — Assessment & Plan Note (Signed)
Followed q  6 months by heme /onc and is stable

## 2023-04-18 NOTE — Assessment & Plan Note (Signed)
Renee Collins is reminded of the importance of commitment to daily physical activity for 30 minutes or more, as able and the need to limit carbohydrate intake to 30 to 60 grams per meal to help with blood sugar control.   The need to take medication as prescribed, test blood sugar as directed, and to call between visits if there is a concern that blood sugar is uncontrolled is also discussed.   Diet controlled and unchanged    Latest Ref Rng & Units 04/14/2023   11:32 AM 12/10/2022   11:06 AM 10/19/2022    8:46 AM 07/13/2022    1:37 PM 04/08/2022    9:54 AM  Diabetic Labs  HbA1c 4.8 - 5.6 % 6.6  6.6   6.5    Chol 100 - 199 mg/dL  161   096    HDL >04 mg/dL  64   63    Calc LDL 0 - 99 mg/dL  61   75    Triglycerides 0 - 149 mg/dL  69   90    Creatinine 0.57 - 1.00 mg/dL 5.40  9.81  1.91   4.78       04/14/2023   10:08 AM 03/07/2023   10:39 AM 03/07/2023    9:30 AM 03/07/2023    9:28 AM 03/07/2023    9:27 AM 02/21/2023    8:13 AM 02/21/2023    7:02 AM  BP/Weight  Systolic BP 139 122 128 128 128 127 130  Diastolic BP 74 73 75 75 75 76 68  Wt. (Lbs) 179      186.95  BMI 28.89 kg/m2      31.11 kg/m2      Latest Ref Rng & Units 12/08/2022   12:00 AM 02/23/2022    9:20 AM  Foot/eye exam completion dates  Eye Exam No Retinopathy No Retinopathy       Foot Form Completion   Done     This result is from an external source.

## 2023-04-18 NOTE — Assessment & Plan Note (Signed)
Controlled, no change in medication  

## 2023-04-18 NOTE — Assessment & Plan Note (Signed)
Controlled, no change in medication DASH diet and commitment to daily physical activity for a minimum of 30 minutes discussed and encouraged, as a part of hypertension management. The importance of attaining a healthy weight is also discussed.     04/14/2023   10:08 AM 03/07/2023   10:39 AM 03/07/2023    9:30 AM 03/07/2023    9:28 AM 03/07/2023    9:27 AM 02/21/2023    8:13 AM 02/21/2023    7:02 AM  BP/Weight  Systolic BP 139 122 128 128 128 127 130  Diastolic BP 74 73 75 75 75 76 68  Wt. (Lbs) 179      186.95  BMI 28.89 kg/m2      31.11 kg/m2

## 2023-04-18 NOTE — Assessment & Plan Note (Signed)
After obtaining informed consent, the vaccine is  administered , with no adverse effect noted at the time of administration.  

## 2023-04-18 NOTE — Assessment & Plan Note (Signed)
Reports decreased appetite, will work on intentional; eating to maintain current weight , re eval in 8 weeks, stress related I believe

## 2023-04-18 NOTE — Progress Notes (Signed)
Renee Collins     MRN: [id number]      DOB: 04-Feb-1941  Chief Complaint  Patient presents with   Hypertension    Follow up visit     HPI Renee Collins is here for follow up and re-evaluation of chronic medical conditions, medication management and review of any available recent lab and radiology data.  Preventive health is updated, specifically  Cancer screening and Immunization.  Needs colonoscopy and flu vac administered Questions or concerns regarding consultations or procedures which the PT has had in the interim are  addressed. The PT denies any adverse reactions to current medications since the last visit.  Increased illness and death in close family members, also family challenges, reports poor apetite and has a 10 pound weight loss pat 2 months, denies change in stool caliber or habits, does not feel the need for therapy currently, son who lives with her is excellent  support ROS Denies recent fever or chills. Denies sinus pressure, nasal congestion, ear pain or sore throat. Denies chest congestion, productive cough or wheezing. Denies chest pains, palpitations and leg swelling Denies abdominal pain, nausea, vomiting,diarrhea or constipation.   Denies dysuria, frequency, hesitancy or incontinence. Denies joint pain, swelling and limitation in mobility. Denies headaches, seizures, numbness, or tingling. Denies uncontrolled  depression, does have mildly increased anxiety denies insomnia. Denies skin break down or rash.   PE  BP 139/74   Pulse 84   Resp 16   Ht 5\' 6"  (1.676 m)   Wt 179 lb (81.2 kg)   SpO2 95%   BMI 28.89 kg/m   Patient alert and oriented and in no cardiopulmonary distress.  HEENT: No facial asymmetry, EOMI,     Neck supple .  Chest: Clear to auscultation bilaterally.  CVS: S1, S2 no murmurs, no S3.Regular rate.  ABD: Soft non tender.   Ext: No edema  MS: Adequate ROM spine, shoulders, hips and knees.  Skin: Intact, no ulcerations or rash  noted.  Psych: Good eye contact, normal affect. Memory intact not anxious or depressed appearing.  CNS: CN 2-12 intact, power,  normal throughout.no focal deficits noted.   Assessment & Plan  Type 2 diabetes mellitus (HCC) Renee Collins is reminded of the importance of commitment to daily physical activity for 30 minutes or more, as able and the need to limit carbohydrate intake to 30 to 60 grams per meal to help with blood sugar control.   The need to take medication as prescribed, test blood sugar as directed, and to call between visits if there is a concern that blood sugar is uncontrolled is also discussed.   Diet controlled and unchanged    Latest Ref Rng & Units 04/14/2023   11:32 AM 12/10/2022   11:06 AM 10/19/2022    8:46 AM 07/13/2022    1:37 PM 04/08/2022    9:54 AM  Diabetic Labs  HbA1c 4.8 - 5.6 % 6.6  6.6   6.5    Chol 100 - 199 mg/dL  213   086    HDL >57 mg/dL  64   63    Calc LDL 0 - 99 mg/dL  61   75    Triglycerides 0 - 149 mg/dL  69   90    Creatinine 0.57 - 1.00 mg/dL 8.46  9.62  9.52   8.41       04/14/2023   10:08 AM 03/07/2023   10:39 AM 03/07/2023    9:30 AM 03/07/2023  9:28 AM 03/07/2023    9:27 AM 02/21/2023    8:13 AM 02/21/2023    7:02 AM  BP/Weight  Systolic BP 139 122 128 128 128 127 130  Diastolic BP 74 73 75 75 75 76 68  Wt. (Lbs) 179      186.95  BMI 28.89 kg/m2      31.11 kg/m2      Latest Ref Rng & Units 12/08/2022   12:00 AM 02/23/2022    9:20 AM  Foot/eye exam completion dates  Eye Exam No Retinopathy No Retinopathy       Foot Form Completion   Done     This result is from an external source.        Hypothyroid Controlled, no change in medication   Monoclonal gammopathy Followed q  6 months by heme /onc and is stable  Encounter for immunization After obtaining informed consent, the vaccine is  administered , with no adverse effect noted at the time of administration.   Depression, major, single episode, in partial remission  (HCC) Controlled, no change in medication\   Dyslipidemia Hyperlipidemia:Low fat diet discussed and encouraged.   Lipid Panel  Lab Results  Component Value Date   CHOL 139 12/10/2022   HDL 64 12/10/2022   LDLCALC 61 12/10/2022   TRIG 69 12/10/2022   CHOLHDL 2.2 12/10/2022     Controlled, no change in medication   Essential hypertension Controlled, no change in medication DASH diet and commitment to daily physical activity for a minimum of 30 minutes discussed and encouraged, as a part of hypertension management. The importance of attaining a healthy weight is also discussed.     04/14/2023   10:08 AM 03/07/2023   10:39 AM 03/07/2023    9:30 AM 03/07/2023    9:28 AM 03/07/2023    9:27 AM 02/21/2023    8:13 AM 02/21/2023    7:02 AM  BP/Weight  Systolic BP 139 122 128 128 128 127 130  Diastolic BP 74 73 75 75 75 76 68  Wt. (Lbs) 179      186.95  BMI 28.89 kg/m2      31.11 kg/m2       Unintentional weight loss Reports decreased appetite, will work on intentional; eating to maintain current weight , re eval in 8 weeks, stress related I believe

## 2023-04-19 ENCOUNTER — Encounter: Payer: Self-pay | Admitting: Family Medicine

## 2023-04-20 ENCOUNTER — Inpatient Hospital Stay: Payer: No Typology Code available for payment source | Attending: Hematology

## 2023-04-20 ENCOUNTER — Ambulatory Visit (HOSPITAL_COMMUNITY)
Admission: RE | Admit: 2023-04-20 | Discharge: 2023-04-20 | Disposition: A | Discharge status: Home / Self Care | Payer: No Typology Code available for payment source | Source: Ambulatory Visit | Attending: Physician Assistant | Admitting: Physician Assistant

## 2023-04-20 DIAGNOSIS — D472 Monoclonal gammopathy: Secondary | ICD-10-CM | POA: Insufficient documentation

## 2023-04-20 DIAGNOSIS — D509 Iron deficiency anemia, unspecified: Secondary | ICD-10-CM | POA: Diagnosis not present

## 2023-04-20 DIAGNOSIS — Z981 Arthrodesis status: Secondary | ICD-10-CM | POA: Diagnosis not present

## 2023-04-20 DIAGNOSIS — D631 Anemia in chronic kidney disease: Secondary | ICD-10-CM | POA: Insufficient documentation

## 2023-04-20 DIAGNOSIS — M170 Bilateral primary osteoarthritis of knee: Secondary | ICD-10-CM | POA: Diagnosis not present

## 2023-04-20 DIAGNOSIS — N184 Chronic kidney disease, stage 4 (severe): Secondary | ICD-10-CM | POA: Insufficient documentation

## 2023-04-20 LAB — CBC WITH DIFFERENTIAL/PLATELET
Abs Immature Granulocytes: 0.01 10*3/uL (ref 0.00–0.07)
Basophils Absolute: 0 10*3/uL (ref 0.0–0.1)
Basophils Relative: 1 %
Eosinophils Absolute: 0.3 10*3/uL (ref 0.0–0.5)
Eosinophils Relative: 5 %
HCT: 35.1 % — ABNORMAL LOW (ref 36.0–46.0)
Hemoglobin: 10.6 g/dL — ABNORMAL LOW (ref 12.0–15.0)
Immature Granulocytes: 0 %
Lymphocytes Relative: 37 %
Lymphs Abs: 1.9 10*3/uL (ref 0.7–4.0)
MCH: 26.3 pg (ref 26.0–34.0)
MCHC: 30.2 g/dL (ref 30.0–36.0)
MCV: 87.1 fL (ref 80.0–100.0)
Monocytes Absolute: 0.5 10*3/uL (ref 0.1–1.0)
Monocytes Relative: 10 %
Neutro Abs: 2.5 10*3/uL (ref 1.7–7.7)
Neutrophils Relative %: 47 %
Platelets: 212 10*3/uL (ref 150–400)
RBC: 4.03 MIL/uL (ref 3.87–5.11)
RDW: 13.2 % (ref 11.5–15.5)
WBC: 5.2 10*3/uL (ref 4.0–10.5)
nRBC: 0 % (ref 0.0–0.2)

## 2023-04-20 LAB — COMPREHENSIVE METABOLIC PANEL
ALT: 18 U/L (ref 0–44)
AST: 23 U/L (ref 15–41)
Albumin: 3.8 g/dL (ref 3.5–5.0)
Alkaline Phosphatase: 62 U/L (ref 38–126)
Anion gap: 9 (ref 5–15)
BUN: 30 mg/dL — ABNORMAL HIGH (ref 8–23)
CO2: 27 mmol/L (ref 22–32)
Calcium: 9.9 mg/dL (ref 8.9–10.3)
Chloride: 102 mmol/L (ref 98–111)
Creatinine, Ser: 1.79 mg/dL — ABNORMAL HIGH (ref 0.44–1.00)
GFR, Estimated: 28 mL/min — ABNORMAL LOW (ref >60)
Glucose, Bld: 98 mg/dL (ref 70–99)
Potassium: 3.3 mmol/L — ABNORMAL LOW (ref 3.5–5.1)
Sodium: 138 mmol/L (ref 135–145)
Total Bilirubin: 0.4 mg/dL (ref 0.3–1.2)
Total Protein: 9.1 g/dL — ABNORMAL HIGH (ref 6.5–8.1)

## 2023-04-20 LAB — IRON AND TIBC
Iron: 72 ug/dL (ref 28–170)
Saturation Ratios: 24 % (ref 10.4–31.8)
TIBC: 297 ug/dL (ref 250–450)
UIBC: 225 ug/dL

## 2023-04-20 LAB — LACTATE DEHYDROGENASE: LDH: 137 U/L (ref 98–192)

## 2023-04-20 LAB — FERRITIN: Ferritin: 121 ng/mL (ref 11–307)

## 2023-04-22 LAB — PROTEIN ELECTROPHORESIS, SERUM
A/G Ratio: 0.8 (ref 0.7–1.7)
Albumin ELP: 3.9 g/dL (ref 2.9–4.4)
Alpha-1-Globulin: 0.3 g/dL (ref 0.0–0.4)
Alpha-2-Globulin: 1 g/dL (ref 0.4–1.0)
Beta Globulin: 1 g/dL (ref 0.7–1.3)
Gamma Globulin: 2.5 g/dL — ABNORMAL HIGH (ref 0.4–1.8)
Globulin, Total: 4.8 g/dL — ABNORMAL HIGH (ref 2.2–3.9)
M-Spike, %: 2.2 g/dL — ABNORMAL HIGH
Total Protein ELP: 8.7 g/dL — ABNORMAL HIGH (ref 6.0–8.5)

## 2023-04-22 LAB — KAPPA/LAMBDA LIGHT CHAINS
Kappa free light chain: 199.1 mg/L — ABNORMAL HIGH (ref 3.3–19.4)
Kappa, lambda light chain ratio: 10.06 — ABNORMAL HIGH (ref 0.26–1.65)
Lambda free light chains: 19.8 mg/L (ref 5.7–26.3)

## 2023-04-27 ENCOUNTER — Inpatient Hospital Stay: Payer: No Typology Code available for payment source | Admitting: Physician Assistant

## 2023-05-09 ENCOUNTER — Telehealth (INDEPENDENT_AMBULATORY_CARE_PROVIDER_SITE_OTHER): Payer: Self-pay | Admitting: *Deleted

## 2023-05-09 NOTE — Telephone Encounter (Signed)
Who is your primary care physician: Dr. Lodema Hong  Reasons for the colonoscopy: recall  Have you had a colonoscopy before?  2019  Do you have family history of colon cancer? no  Previous colonoscopy with polyps removed? yes  Do you have a history colorectal cancer?   no  Are you diabetic? If yes, Type 1 or Type 2?    Type 2   Do you have a prosthetic or mechanical heart valve? no  Do you have a pacemaker/defibrillator?   no  Have you had endocarditis/atrial fibrillation? no  Have you had joint replacement within the last 12 months?  no  Do you tend to be constipated or have to use laxatives? yes  Do you have any history of drugs or alchohol?  no  Do you use supplemental oxygen?  no  Have you had a stroke or heart attack within the last 6 months? no  Do you take weight loss medication?  no  For female patients: have you had a hysterectomy?  yes                                     are you post menopausal?       yes                                            do you still have your menstrual cycle?      Do you take any blood-thinning medications such as: (aspirin, warfarin, Plavix, Aggrenox)  aspirin 81mg   If yes we need the name, milligram, dosage and who is prescribing doctor  Current Outpatient Medications on File Prior to Visit  Medication Sig Dispense Refill   acetaminophen (TYLENOL) 650 MG CR tablet Take 650 mg by mouth every 8 (eight) hours as needed for pain.      amLODipine (NORVASC) 10 MG tablet Take 1 tablet (10 mg total) by mouth daily. 100 tablet 2   aspirin 81 MG EC tablet Take 81 mg by mouth daily. 30 tablet 12   benazepril (LOTENSIN) 5 MG tablet Take 1 tablet (5 mg total) by mouth daily. 30 tablet 2   buPROPion (WELLBUTRIN XL) 150 MG 24 hr tablet Take 1 tablet (150 mg total) by mouth daily. 90 tablet 3   calcitRIOL (ROCALTROL) 0.25 MCG capsule Take 0.25 mcg by mouth every Monday, Wednesday, and Friday.     Calcium-Magnesium-Vitamin D (CALCIUM 1200+D3 PO) Take 1  tablet by mouth 2 (two) times daily.     diphenhydrAMINE (BENADRYL) 25 mg capsule Take 25 mg by mouth at bedtime as needed for allergies or sleep.     hydrochlorothiazide (HYDRODIURIL) 25 MG tablet Take 1 tablet (25 mg total) by mouth daily. 100 tablet 2   levothyroxine (SYNTHROID) 88 MCG tablet TAKE 1 TABLET BY MOUTH ONCE DAILY. 1/2 HOUR BEFORE FIRST MEAL OF THE DAY. TAKE WITH WATER. 100 tablet 2   Misc Natural Products (JOINT HEALTH PO) Take 1 capsule by mouth daily.     Multiple Vitamin (MULTIVITAMIN WITH MINERALS) TABS tablet Take 1 tablet by mouth daily.     omeprazole (PRILOSEC) 20 MG capsule Take 1 capsule (20 mg total) by mouth daily as needed (for acid reflux.). 90 capsule 1   pravastatin (PRAVACHOL) 20 MG tablet Take 1 tablet (20 mg total) by mouth  every evening. 100 tablet 2   No current facility-administered medications on file prior to visit.    No Known Allergies   Pharmacy: [facility] number where you can be reached: [phone]

## 2023-05-12 ENCOUNTER — Encounter: Payer: Self-pay | Admitting: *Deleted

## 2023-05-12 ENCOUNTER — Other Ambulatory Visit: Payer: Self-pay | Admitting: *Deleted

## 2023-05-12 MED ORDER — PEG 3350-KCL-NA BICARB-NACL 420 G PO SOLR
4000.0000 mL | Freq: Once | ORAL | 0 refills | Status: AC
Start: 2023-05-12 — End: 2023-05-12

## 2023-05-12 NOTE — Telephone Encounter (Signed)
Pt has been scheduled for 06/14/23, instructions mailed and prep sent to the pharmacy.

## 2023-05-16 ENCOUNTER — Encounter (INDEPENDENT_AMBULATORY_CARE_PROVIDER_SITE_OTHER): Payer: Self-pay | Admitting: *Deleted

## 2023-05-16 NOTE — Telephone Encounter (Signed)
Referral completed, [facility] apt letter sent to PCP

## 2023-05-17 ENCOUNTER — Ambulatory Visit: Payer: No Typology Code available for payment source | Admitting: Podiatry

## 2023-06-01 DIAGNOSIS — D638 Anemia in other chronic diseases classified elsewhere: Secondary | ICD-10-CM | POA: Diagnosis not present

## 2023-06-01 DIAGNOSIS — D472 Monoclonal gammopathy: Secondary | ICD-10-CM | POA: Diagnosis not present

## 2023-06-01 DIAGNOSIS — N189 Chronic kidney disease, unspecified: Secondary | ICD-10-CM | POA: Diagnosis not present

## 2023-06-01 DIAGNOSIS — N184 Chronic kidney disease, stage 4 (severe): Secondary | ICD-10-CM | POA: Diagnosis not present

## 2023-06-01 DIAGNOSIS — I129 Hypertensive chronic kidney disease with stage 1 through stage 4 chronic kidney disease, or unspecified chronic kidney disease: Secondary | ICD-10-CM | POA: Diagnosis not present

## 2023-06-01 DIAGNOSIS — E1129 Type 2 diabetes mellitus with other diabetic kidney complication: Secondary | ICD-10-CM | POA: Diagnosis not present

## 2023-06-02 ENCOUNTER — Encounter: Payer: Self-pay | Admitting: Family Medicine

## 2023-06-02 ENCOUNTER — Ambulatory Visit (INDEPENDENT_AMBULATORY_CARE_PROVIDER_SITE_OTHER): Payer: No Typology Code available for payment source | Admitting: Family Medicine

## 2023-06-02 VITALS — BP 133/75 | HR 98 | Ht 66.0 in | Wt 181.1 lb

## 2023-06-02 DIAGNOSIS — E1169 Type 2 diabetes mellitus with other specified complication: Secondary | ICD-10-CM | POA: Diagnosis not present

## 2023-06-02 DIAGNOSIS — E785 Hyperlipidemia, unspecified: Secondary | ICD-10-CM | POA: Diagnosis not present

## 2023-06-02 DIAGNOSIS — E559 Vitamin D deficiency, unspecified: Secondary | ICD-10-CM | POA: Diagnosis not present

## 2023-06-02 DIAGNOSIS — E039 Hypothyroidism, unspecified: Secondary | ICD-10-CM

## 2023-06-02 DIAGNOSIS — N1832 Chronic kidney disease, stage 3b: Secondary | ICD-10-CM | POA: Diagnosis not present

## 2023-06-02 DIAGNOSIS — F324 Major depressive disorder, single episode, in partial remission: Secondary | ICD-10-CM

## 2023-06-02 DIAGNOSIS — E663 Overweight: Secondary | ICD-10-CM

## 2023-06-02 DIAGNOSIS — E1149 Type 2 diabetes mellitus with other diabetic neurological complication: Secondary | ICD-10-CM | POA: Diagnosis not present

## 2023-06-02 DIAGNOSIS — D472 Monoclonal gammopathy: Secondary | ICD-10-CM | POA: Diagnosis not present

## 2023-06-02 DIAGNOSIS — I10 Essential (primary) hypertension: Secondary | ICD-10-CM | POA: Diagnosis not present

## 2023-06-02 NOTE — Patient Instructions (Addendum)
Annual exam in early March, call if you need me sooner  Please schedule wellness visit at checkout  Check blood pressure twice weekly, if consistently over 135, then call in / message and let me know  Urine ACR today  Fasting lipid, hBA1c, cmp and EGFr TSH and vit D 3 to 5 days before next visit  Best for 2025!  Thanks for choosing [facility], we consider it a privelige to serve you.

## 2023-06-03 NOTE — Patient Instructions (Signed)
Renee Collins  06/03/2023     @PREFPERIOPPHARMACY @   Your procedure is scheduled on 06/14/2023.   Report to [facility] at 7:30 A.M.   Call this number if you have problems the morning of surgery:  [phone]  If you experience any cold or flu symptoms such as cough, fever, chills, shortness of breath, etc. between now and your scheduled surgery, please notify us at the above number.   Remember:   Do not eat after midnight.   You may drink clear liquids until 5:30am .  Clear liquids allowed are:                      Water, Juice (No red color; non-citric and without pulp; diabetics please choose diet or no sugar options), Carbonated beverages (diabetics please choose diet or no sugar options), Clear Tea (No creamer, milk, or cream, including half & half and powdered creamer), Black Coffee Only (No creamer, milk or cream, including half & half and powdered creamer), and Clear Sports drink (No red color; diabetics please choose diet or no sugar options)    Take these medicines the morning of surgery with A SIP OF WATER : Amlodipine Wellbutrin Synthroid and Prilosec    Do not wear jewelry, make-up or nail polish, including gel polish,  artificial nails, or any other type of covering on natural nails (fingers and  toes).  Do not wear lotions, powders, or perfumes, or deodorant.  Do not shave 48 hours prior to surgery.  Men may shave face and neck.  Do not bring valuables to the hospital.  [facility] is not responsible for any belongings or valuables.  Contacts, dentures or bridgework may not be worn into surgery.  Leave your suitcase in the car.  After surgery it may be brought to your room.  For patients admitted to the hospital, discharge time will be determined by your treatment team.  Patients discharged the day of surgery will not be allowed to drive home.   Name and phone number of your driver:   Family Special instructions:  N/A  Please read over the following fact  sheets that you were given.  Care and Recovery After Surgery  Colonoscopy, Adult A colonoscopy is a procedure to look at the entire large intestine. This procedure is done using a long, thin, flexible tube that has a camera on the end. You may have a colonoscopy: As a part of normal colorectal screening. If you have certain symptoms, such as: A low number of red blood cells in your blood (anemia). Diarrhea that does not go away. Pain in your abdomen. Blood in your stool. A colonoscopy can help screen for and diagnose medical problems, including: An abnormal growth of cells or tissue (tumor). Abnormal growths within the lining of your intestine (polyps). Inflammation. Areas of bleeding. Tell your health care provider about: Any allergies you have. All medicines you are taking, including vitamins, herbs, eye drops, creams, and over-the-counter medicines. Any problems you or family members have had with anesthetic medicines. Any bleeding problems you have. Any surgeries you have had. Any medical conditions you have. Any problems you have had with having bowel movements. Whether you are pregnant or may be pregnant. What are the risks? Generally, this is a safe procedure. However, problems may occur, including: Bleeding. Damage to your intestine. Allergic reactions to medicines given during the procedure. Infection. This is rare. What happens before the procedure? Eating and drinking restrictions Follow instructions from  your health care provider about eating or drinking restrictions, which may include: A few days before the procedure: Follow a low-fiber diet. Avoid nuts, seeds, dried fruit, raw fruits, and vegetables. 1-3 days before the procedure: Eat only gelatin dessert or ice pops. Drink only clear liquids, such as water, clear juice, clear broth or bouillon, black coffee or tea, or clear soft drinks or sports drinks. Avoid liquids that contain red or purple dye. The day of  the procedure: Do not eat solid foods. You may continue to drink clear liquids until up to 2 hours before the procedure. Do not eat or drink anything starting 2 hours before the procedure, or within the time period that your health care provider recommends. Bowel prep If you were prescribed a bowel prep to take by mouth (orally) to clean out your colon: Take it as told by your health care provider. Starting the day before your procedure, you will need to drink a large amount of liquid medicine. The liquid will cause you to have many bowel movements of loose stool until your stool becomes almost clear or light green. If your skin or the opening between the buttocks (anus) gets irritated from diarrhea, you may relieve the irritation using: Wipes with medicine in them, such as adult wet wipes with aloe and vitamin E. A product to soothe skin, such as petroleum jelly. If you vomit while drinking the bowel prep: Take a break for up to 60 minutes. Begin the bowel prep again. Call your health care provider if you keep vomiting or you cannot take the bowel prep without vomiting. To clean out your colon, you may also be given: Laxative medicines. These help you have a bowel movement. Instructions for enema use. An enema is liquid medicine injected into your rectum. Medicines Ask your health care provider about: Changing or stopping your regular medicines or supplements. This is especially important if you are taking iron supplements, diabetes medicines, or blood thinners. Taking medicines such as aspirin and ibuprofen. These medicines can thin your blood. Do not take these medicines unless your health care provider tells you to take them. Taking over-the-counter medicines, vitamins, herbs, and supplements. General instructions Ask your health care provider what steps will be taken to help prevent infection. These may include washing skin with a germ-killing soap. If you will be going home right after  the procedure, plan to have a responsible adult: Take you home from the hospital or clinic. You will not be allowed to drive. Care for you for the time you are told. What happens during the procedure?  An IV will be inserted into one of your veins. You will be given a medicine to make you fall asleep (general anesthetic). You will lie on your side with your knees bent. A lubricant will be put on the tube. Then the tube will be: Inserted into your anus. Gently eased through all parts of your large intestine. Air will be sent into your colon to keep it open. This may cause some pressure or cramping. Images will be taken with the camera and will appear on a screen. A small tissue sample may be removed to be looked at under a microscope (biopsy). The tissue may be sent to a lab for testing if any signs of problems are found. If small polyps are found, they may be removed and checked for cancer cells. When the procedure is finished, the tube will be removed. The procedure may vary among health care providers and hospitals. What  happens after the procedure? Your blood pressure, heart rate, breathing rate, and blood oxygen level will be monitored until you leave the hospital or clinic. You may have a small amount of blood in your stool. You may pass gas and have mild cramping or bloating in your abdomen. This is caused by the air that was used to open your colon during the exam. If you were given a sedative during the procedure, it can affect you for several hours. Do not drive or operate machinery until your health care provider says that it is safe. It is up to you to get the results of your procedure. Ask your health care provider, or the department that is doing the procedure, when your results will be ready. Summary A colonoscopy is a procedure to look at the entire large intestine. Follow instructions from your health care provider about eating and drinking before the procedure. If you were  prescribed an oral bowel prep to clean out your colon, take it as told by your health care provider. During the colonoscopy, a flexible tube with a camera on its end is inserted into the anus and then passed into all parts of the large intestine. This information is not intended to replace advice given to you by your health care provider. Make sure you discuss any questions you have with your health care provider. Document Revised: 07/27/2022 Document Reviewed: 02/04/2021 Elsevier Patient Education  2024 [employer]   Monitored Anesthesia Care Anesthesia refers to the techniques, procedures, and medicines that help a person stay safe and comfortable during surgery. Monitored anesthesia care, or sedation, is one type of anesthesia. You may have sedation if you do not need to be asleep for your procedure. Procedures that use sedation may include: Surgery to remove cataracts from your eyes. A dental procedure. A biopsy. This is when a tissue sample is removed and looked at under a microscope. You will be watched closely during your procedure. Your level of sedation or type of anesthesia may be changed to fit your needs. Tell a health care provider about: Any allergies you have. All medicines you are taking, including vitamins, herbs, eye drops, creams, and over-the-counter medicines. Any problems you or family members have had with anesthesia. Any bleeding problems you have. Any surgeries you have had. Any medical conditions or illnesses you have. This includes sleep apnea, cough, fever, or the flu. Whether you are pregnant or may be pregnant. Whether you use cigarettes, alcohol, or drugs. Any use of steroids, whether by mouth or as a cream. What are the risks? Your health care provider will talk with you about risks. These may include: Getting too much medicine (oversedation). Nausea. Allergic reactions to medicines. Trouble breathing. If this happens, a breathing tube may be used to  help you breathe. It will be removed when you are awake and breathing on your own. Heart trouble. Lung trouble. Confusion that gets better with time (emergence delirium). What happens before the procedure? When to stop eating and drinking Follow instructions from your health care provider about what you may eat and drink. These may include: 8 hours before your procedure Stop eating most foods. Do not eat meat, fried foods, or fatty foods. Eat only light foods, such as toast or crackers. All liquids are okay except energy drinks and alcohol. 6 hours before your procedure Stop eating. Drink only clear liquids, such as water, clear fruit juice, black coffee, plain tea, and sports drinks. Do not drink energy drinks or alcohol. 2  hours before your procedure Stop drinking all liquids. You may be allowed to take medicines with small sips of water. If you do not follow your health care provider's instructions, your procedure may be delayed or canceled. Medicines Ask your health care provider about: Changing or stopping your regular medicines. These include any diabetes medicines or blood thinners you take. Taking medicines such as aspirin and ibuprofen. These medicines can thin your blood. Do not take them unless your health care provider tells you to. Taking over-the-counter medicines, vitamins, herbs, and supplements. Testing You may have an exam or testing. You may have a blood or urine sample taken. General instructions Do not use any products that contain nicotine or tobacco for at least 4 weeks before the procedure. These products include cigarettes, chewing tobacco, and vaping devices, such as e-cigarettes. If you need help quitting, ask your health care provider. If you will be going home right after the procedure, plan to have a responsible adult: Take you home from the hospital or clinic. You will not be allowed to drive. Care for you for the time you are told. What happens during  the procedure?  Your blood pressure, heart rate, breathing, level of pain, and blood oxygen level will be monitored. An IV will be inserted into one of your veins. You may be given: A sedative. This helps you relax. Anesthesia. This will: Numb certain areas of your body. Make you fall asleep for surgery. You will be given medicines as needed to keep you comfortable. The more medicine you are given, the deeper your level of sedation will be. Your level of sedation may be changed to fit your needs. There are three levels of sedation: Mild sedation. At this level, you may feel awake and relaxed. You will be able to follow directions. Moderate sedation. At this level, you will be sleepy. You may not remember the procedure. Deep sedation. At this level, you will be asleep. You will not remember the procedure. How you get the medicines will depend on your age and the procedure. They may be given as: A pill. This may be taken by mouth (orally) or inserted into the rectum. An injection. This may be into a vein or muscle. A spray through the nose. After your procedure is over, the medicine will be stopped. The procedure may vary among health care providers and hospitals. What happens after the procedure? Your blood pressure, heart rate, breathing rate, and blood oxygen level will be monitored until you leave the hospital or clinic. You may feel sleepy, clumsy, or nauseous. You may not remember what happened during or after the procedure. Sedation can affect you for several hours. Do not drive or use machinery until your health care provider says that it is safe. This information is not intended to replace advice given to you by your health care provider. Make sure you discuss any questions you have with your health care provider. Document Revised: 11/09/2021 Document Reviewed: 11/09/2021 Elsevier Patient Education  2024 [employer].

## 2023-06-06 ENCOUNTER — Encounter (HOSPITAL_COMMUNITY): Payer: Self-pay

## 2023-06-06 ENCOUNTER — Encounter (HOSPITAL_COMMUNITY)
Admission: RE | Admit: 2023-06-06 | Discharge: 2023-06-06 | Disposition: A | Discharge status: Home / Self Care | Payer: No Typology Code available for payment source | Source: Ambulatory Visit | Attending: Gastroenterology | Admitting: Gastroenterology

## 2023-06-06 VITALS — BP 136/75 | HR 97 | Temp 97.9°F | Resp 16 | Ht 66.0 in | Wt 181.1 lb

## 2023-06-06 DIAGNOSIS — E1100 Type 2 diabetes mellitus with hyperosmolarity without nonketotic hyperglycemic-hyperosmolar coma (NKHHC): Secondary | ICD-10-CM | POA: Diagnosis not present

## 2023-06-06 DIAGNOSIS — I10 Essential (primary) hypertension: Secondary | ICD-10-CM | POA: Insufficient documentation

## 2023-06-06 DIAGNOSIS — Z01818 Encounter for other preprocedural examination: Secondary | ICD-10-CM | POA: Insufficient documentation

## 2023-06-06 DIAGNOSIS — D500 Iron deficiency anemia secondary to blood loss (chronic): Secondary | ICD-10-CM | POA: Insufficient documentation

## 2023-06-06 LAB — CBC WITH DIFFERENTIAL/PLATELET
Abs Immature Granulocytes: 0.01 10*3/uL (ref 0.00–0.07)
Basophils Absolute: 0 10*3/uL (ref 0.0–0.1)
Basophils Relative: 1 %
Eosinophils Absolute: 0.2 10*3/uL (ref 0.0–0.5)
Eosinophils Relative: 5 %
HCT: 33.6 % — ABNORMAL LOW (ref 36.0–46.0)
Hemoglobin: 10.5 g/dL — ABNORMAL LOW (ref 12.0–15.0)
Immature Granulocytes: 0 %
Lymphocytes Relative: 38 %
Lymphs Abs: 1.8 10*3/uL (ref 0.7–4.0)
MCH: 27.1 pg (ref 26.0–34.0)
MCHC: 31.3 g/dL (ref 30.0–36.0)
MCV: 86.8 fL (ref 80.0–100.0)
Monocytes Absolute: 0.4 10*3/uL (ref 0.1–1.0)
Monocytes Relative: 9 %
Neutro Abs: 2.3 10*3/uL (ref 1.7–7.7)
Neutrophils Relative %: 47 %
Platelets: 244 10*3/uL (ref 150–400)
RBC: 3.87 MIL/uL (ref 3.87–5.11)
RDW: 13.4 % (ref 11.5–15.5)
WBC: 4.8 10*3/uL (ref 4.0–10.5)
nRBC: 0 % (ref 0.0–0.2)

## 2023-06-06 LAB — BASIC METABOLIC PANEL
Anion gap: 8 (ref 5–15)
BUN: 25 mg/dL — ABNORMAL HIGH (ref 8–23)
CO2: 21 mmol/L — ABNORMAL LOW (ref 22–32)
Calcium: 9.4 mg/dL (ref 8.9–10.3)
Chloride: 108 mmol/L (ref 98–111)
Creatinine, Ser: 1.94 mg/dL — ABNORMAL HIGH (ref 0.44–1.00)
GFR, Estimated: 25 mL/min — ABNORMAL LOW (ref >60)
Glucose, Bld: 107 mg/dL — ABNORMAL HIGH (ref 70–99)
Potassium: 3.7 mmol/L (ref 3.5–5.1)
Sodium: 137 mmol/L (ref 135–145)

## 2023-06-07 LAB — MICROALBUMIN / CREATININE URINE RATIO
Creatinine, Urine: 185.1 mg/dL
Microalb/Creat Ratio: 91 mg/g{creat} — ABNORMAL HIGH (ref 0–29)
Microalbumin, Urine: 169 ug/mL

## 2023-06-08 DIAGNOSIS — E211 Secondary hyperparathyroidism, not elsewhere classified: Secondary | ICD-10-CM | POA: Diagnosis not present

## 2023-06-08 DIAGNOSIS — D472 Monoclonal gammopathy: Secondary | ICD-10-CM | POA: Diagnosis not present

## 2023-06-08 DIAGNOSIS — R809 Proteinuria, unspecified: Secondary | ICD-10-CM | POA: Diagnosis not present

## 2023-06-08 DIAGNOSIS — E1129 Type 2 diabetes mellitus with other diabetic kidney complication: Secondary | ICD-10-CM | POA: Diagnosis not present

## 2023-06-14 ENCOUNTER — Ambulatory Visit (HOSPITAL_COMMUNITY): Payer: Self-pay | Admitting: Anesthesiology

## 2023-06-14 ENCOUNTER — Telehealth (INDEPENDENT_AMBULATORY_CARE_PROVIDER_SITE_OTHER): Payer: Self-pay | Admitting: *Deleted

## 2023-06-14 ENCOUNTER — Encounter (HOSPITAL_COMMUNITY)
Admission: RE | Disposition: A | Discharge status: Home / Self Care | Payer: Self-pay | Source: Home / Self Care | Attending: Gastroenterology

## 2023-06-14 ENCOUNTER — Encounter (HOSPITAL_COMMUNITY): Payer: Self-pay

## 2023-06-14 ENCOUNTER — Ambulatory Visit (HOSPITAL_COMMUNITY)
Admission: RE | Admit: 2023-06-14 | Discharge: 2023-06-14 | Disposition: A | Discharge status: Home / Self Care | Payer: No Typology Code available for payment source | Attending: Gastroenterology | Admitting: Gastroenterology

## 2023-06-14 ENCOUNTER — Ambulatory Visit (HOSPITAL_BASED_OUTPATIENT_CLINIC_OR_DEPARTMENT_OTHER): Payer: Self-pay | Admitting: Anesthesiology

## 2023-06-14 DIAGNOSIS — Z5982 Transportation insecurity: Secondary | ICD-10-CM | POA: Diagnosis not present

## 2023-06-14 DIAGNOSIS — K648 Other hemorrhoids: Secondary | ICD-10-CM | POA: Insufficient documentation

## 2023-06-14 DIAGNOSIS — N189 Chronic kidney disease, unspecified: Secondary | ICD-10-CM | POA: Insufficient documentation

## 2023-06-14 DIAGNOSIS — D122 Benign neoplasm of ascending colon: Secondary | ICD-10-CM | POA: Diagnosis not present

## 2023-06-14 DIAGNOSIS — I350 Nonrheumatic aortic (valve) stenosis: Secondary | ICD-10-CM | POA: Diagnosis not present

## 2023-06-14 DIAGNOSIS — I129 Hypertensive chronic kidney disease with stage 1 through stage 4 chronic kidney disease, or unspecified chronic kidney disease: Secondary | ICD-10-CM | POA: Diagnosis not present

## 2023-06-14 DIAGNOSIS — Z860100 Personal history of colon polyps, unspecified: Secondary | ICD-10-CM

## 2023-06-14 DIAGNOSIS — K644 Residual hemorrhoidal skin tags: Secondary | ICD-10-CM | POA: Insufficient documentation

## 2023-06-14 DIAGNOSIS — D649 Anemia, unspecified: Secondary | ICD-10-CM | POA: Diagnosis not present

## 2023-06-14 DIAGNOSIS — Z1211 Encounter for screening for malignant neoplasm of colon: Secondary | ICD-10-CM | POA: Insufficient documentation

## 2023-06-14 DIAGNOSIS — R7303 Prediabetes: Secondary | ICD-10-CM | POA: Insufficient documentation

## 2023-06-14 DIAGNOSIS — R634 Abnormal weight loss: Secondary | ICD-10-CM | POA: Diagnosis not present

## 2023-06-14 DIAGNOSIS — K635 Polyp of colon: Secondary | ICD-10-CM | POA: Diagnosis not present

## 2023-06-14 HISTORY — PX: POLYPECTOMY: SHX5525

## 2023-06-14 HISTORY — PX: COLONOSCOPY WITH PROPOFOL: SHX5780

## 2023-06-14 LAB — HM COLONOSCOPY

## 2023-06-14 LAB — GLUCOSE, CAPILLARY: Glucose-Capillary: 98 mg/dL (ref 70–99)

## 2023-06-14 SURGERY — COLONOSCOPY WITH PROPOFOL
Anesthesia: General

## 2023-06-14 MED ORDER — STERILE WATER FOR IRRIGATION IR SOLN
Status: DC | PRN
  Administered 2023-06-14: 120 mL

## 2023-06-14 MED ORDER — PHENYLEPHRINE 80 MCG/ML (10ML) SYRINGE FOR IV PUSH (FOR BLOOD PRESSURE SUPPORT)
PREFILLED_SYRINGE | INTRAVENOUS | Status: DC | PRN
  Administered 2023-06-14 (×2): 160 ug via INTRAVENOUS
  Administered 2023-06-14 (×2): 80 ug via INTRAVENOUS
  Administered 2023-06-14 (×2): 160 ug via INTRAVENOUS

## 2023-06-14 MED ORDER — LACTATED RINGERS IV SOLN: INTRAVENOUS | Status: DC

## 2023-06-14 MED ORDER — PROPOFOL 500 MG/50ML IV EMUL
INTRAVENOUS | Status: DC | PRN
  Administered 2023-06-14: 150 ug/kg/min via INTRAVENOUS

## 2023-06-14 MED ORDER — PHENYLEPHRINE 80 MCG/ML (10ML) SYRINGE FOR IV PUSH (FOR BLOOD PRESSURE SUPPORT)
PREFILLED_SYRINGE | INTRAVENOUS | Status: AC
Start: 2023-06-14 — End: ?
  Filled 2023-06-14: qty 10

## 2023-06-14 MED ORDER — LIDOCAINE HCL (PF) 2 % IJ SOLN
INTRAMUSCULAR | Status: DC | PRN
  Administered 2023-06-14: 50 mg via INTRADERMAL

## 2023-06-14 MED ORDER — PROPOFOL 10 MG/ML IV BOLUS
INTRAVENOUS | Status: DC | PRN
  Administered 2023-06-14: 100 mg via INTRAVENOUS

## 2023-06-14 NOTE — Discharge Instructions (Signed)
  Discharge instructions Please read the instructions outlined below and refer to this sheet in the next few weeks. These discharge instructions provide you with general information on caring for yourself after you leave the hospital. Your doctor may also give you specific instructions. While your treatment has been planned according to the most current medical practices available, unavoidable complications occasionally occur. If you have any problems or questions after discharge, please call your doctor. ACTIVITY You may resume your regular activity but move at a slower pace for the next 24 hours.  Take frequent rest periods for the next 24 hours.  Walking will help expel (get rid of) the air and reduce the bloated feeling in your abdomen.  No driving for 24 hours (because of the anesthesia (medicine) used during the test).  You may shower.  Do not sign any important legal documents or operate any machinery for 24 hours (because of the anesthesia used during the test).  NUTRITION Drink plenty of fluids.  You may resume your normal diet.  Begin with a light meal and progress to your normal diet.  Avoid alcoholic beverages for 24 hours or as instructed by your caregiver.  MEDICATIONS You may resume your normal medications unless your caregiver tells you otherwise.  WHAT YOU CAN EXPECT TODAY You may experience abdominal discomfort such as a feeling of fullness or "gas" pains.  FOLLOW-UP Your doctor will discuss the results of your test with you.  SEEK IMMEDIATE MEDICAL ATTENTION IF ANY OF THE FOLLOWING OCCUR: Excessive nausea (feeling sick to your stomach) and/or vomiting.  Severe abdominal pain and distention (swelling).  Trouble swallowing.  Temperature over 101 F (37.8 C).  Rectal bleeding or vomiting of blood.     Will recommend seeing a colorectal surgeon and GI ( [place] ) in the clinic   I hope you have a great rest of your week!   Vista Lawman , M.D.. Gastroenterology and  Hepatology [facility]

## 2023-06-14 NOTE — Telephone Encounter (Signed)
-----   Message from Franky Macho sent at 06/14/2023 10:03 AM EST ----- Regarding: referral and app Hi Mitzie,  Can you please schedule a follow up appointment for this patient in 3-4 weeks  with me or any of the APPs?   Hi Odell Choung  Can you refer this patient to colorectal surgeon  Diagnosis: protruding Anal Lesion    Thanks,  Vista Lawman , MD Gastroenterology and Hepatology [facility]

## 2023-06-14 NOTE — Transfer of Care (Signed)
Immediate Anesthesia Transfer of Care Note  Patient: Renee Collins  Procedure(s) Performed: COLONOSCOPY WITH PROPOFOL POLYPECTOMY  Patient Location: Short Stay  Anesthesia Type:General  Level of Consciousness: awake, alert , and oriented  Airway & Oxygen Therapy: Patient Spontanous Breathing  Post-op Assessment: Report given to RN and Post -op Vital signs reviewed and stable  Post vital signs: Reviewed and stable  Last Vitals:  Vitals Value Taken Time  BP 97/51 06/14/23 0954  Temp 36.4 C 06/14/23 0954  Pulse 83 06/14/23 0954  Resp 22 06/14/23 0954  SpO2 99 % 06/14/23 0954    Last Pain:  Vitals:   06/14/23 0954  TempSrc: Oral  PainSc: 0-No pain         Complications: No notable events documented.

## 2023-06-14 NOTE — Op Note (Addendum)
[facility] Patient Name: Renee Collins Procedure Date: 06/14/2023 9:18 AM MRN: [id number] Date of Birth: 13-May-1941 Attending MD: Sanjuan Dame , MD, [id number] CSN: [id number] Age: 82 Admit Type: Outpatient Procedure:                Colonoscopy Indications:              Screening for colorectal malignant neoplasm Providers:                Sanjuan Dame, MD, Sheran Fava, Elinor Parkinson Referring MD:              Medicines:                Monitored Anesthesia Care Complications:            No immediate complications. Estimated Blood Loss:     Estimated blood loss was minimal. Procedure:                Pre-Anesthesia Assessment:                           - Prior to the procedure, a History and Physical                            was performed, and patient medications and                            allergies were reviewed. The patient's tolerance of                            previous anesthesia was also reviewed. The risks                            and benefits of the procedure and the sedation                            options and risks were discussed with the patient.                            All questions were answered, and informed consent                            was obtained. Prior Anticoagulants: The patient has                            taken no anticoagulant or antiplatelet agents. ASA                            Grade Assessment: III - A patient with severe                            systemic disease. After reviewing the risks and  benefits, the patient was deemed in satisfactory                            condition to undergo the procedure.                           After obtaining informed consent, the colonoscope                            was passed under direct vision. Throughout the                            procedure, the patient's blood pressure, pulse, and                            oxygen  saturations were monitored continuously. The                            [id number]-[id number] ([id number]) scope was introduced through                            the anus and advanced to the the cecum, identified                            by appendiceal orifice and ileocecal valve. The                            colonoscopy was performed without difficulty. The                            patient tolerated the procedure well. The quality                            of the bowel preparation was evaluated using the                            BBPS [facility] Bowel Preparation Scale) with scores                            of: Right Colon = 3, Transverse Colon = 3 and Left                            Colon = 3 (entire mucosa seen well with no residual                            staining, small fragments of stool or opaque                            liquid). The total BBPS score equals 9. The                            ileocecal valve, appendiceal orifice, and rectum  were photographed. Scope In: 9:32:03 AM Scope Out: 9:50:37 AM Scope Withdrawal Time: 0 hours 12 minutes 6 seconds  Total Procedure Duration: 0 hours 18 minutes 34 seconds  Findings:      The perianal exam was abnormal.      A 3 mm polyp was found in the ascending colon. The polyp was sessile.       The polyp was removed with a cold snare. Resection and retrieval were       complete.      Non-bleeding external and internal hemorrhoids were found during       retroflexion. The hemorrhoids were medium-sized. Impression:               - Abnormal perianal exam.Protruding soft large                            lesion                           - One 3 mm polyp in the ascending colon, removed                            with a cold snare. Resected and retrieved.                           - Non-bleeding external and internal hemorrhoids. Moderate Sedation:      Per Anesthesia Care Recommendation:           - Patient has a contact  number available for                            emergencies. The signs and symptoms of potential                            delayed complications were discussed with the                            patient. Return to normal activities tomorrow.                            Written discharge instructions were provided to the                            patient.                           - Resume previous diet.                           - Continue present medications.                           - Await pathology results.                           - No repeat colonoscopy due to current age (46  years or older).                           - Return to GI clinic at appointment to be                            scheduled. To be assessed further for questionable                            unintentional weight loss and Anemia                           - Refer to a colo-rectal surgeon at appointment to                            be scheduled. For protruding anal lesion Procedure Code(s):        --- Professional ---                           [phone], Colonoscopy, flexible; with removal of                            tumor(s), polyp(s), or other lesion(s) by snare                            technique Diagnosis Code(s):        --- Professional ---                           Z12.11, Encounter for screening for malignant                            neoplasm of colon                           K64.8, Other hemorrhoids                           D12.2, Benign neoplasm of ascending colon CPT copyright 2022 American Medical Association. All rights reserved. The codes documented in this report are preliminary and upon coder review may  be revised to meet current compliance requirements. Sanjuan Dame, MD Sanjuan Dame, MD 06/14/2023 10:03:02 AM This report has been signed electronically. Number of Addenda: 0

## 2023-06-14 NOTE — Telephone Encounter (Signed)
Referral sent, they will contact patient with apt

## 2023-06-14 NOTE — Anesthesia Preprocedure Evaluation (Signed)
Anesthesia Evaluation  Patient identified by MRN, date of birth, ID band Patient awake    Reviewed: Allergy & Precautions, H&P , NPO status , Patient's Chart, lab work & pertinent test results, reviewed documented beta blocker date and time   Airway Mallampati: II  TM Distance: >3 FB Neck ROM: full    Dental no notable dental hx.    Pulmonary neg pulmonary ROS   Pulmonary exam normal breath sounds clear to auscultation       Cardiovascular Exercise Tolerance: Good hypertension, negative cardio ROS + Valvular Problems/Murmurs AS  Rhythm:regular Rate:Normal     Neuro/Psych  PSYCHIATRIC DISORDERS  Depression     Neuromuscular disease negative neurological ROS  negative psych ROS   GI/Hepatic negative GI ROS, Neg liver ROS,GERD  ,,  Endo/Other  negative endocrine ROSdiabetesHypothyroidism    Renal/GU Renal diseasenegative Renal ROS  negative genitourinary   Musculoskeletal   Abdominal   Peds  Hematology negative hematology ROS (+) Blood dyscrasia, anemia   Anesthesia Other Findings  1. Left ventricular ejection fraction, by estimation, is 65 to 70%. The  left ventricle has normal function. Left ventricular endocardial border  not optimally defined to evaluate regional wall motion. There is mild left  ventricular hypertrophy. Left  ventricular diastolic parameters are consistent with Grade I diastolic  dysfunction (impaired relaxation).   2. Right ventricular systolic function is normal. The right ventricular  size is normal. There is normal pulmonary artery systolic pressure.   3. The mitral valve is abnormal. No evidence of mitral valve  regurgitation. Mild mitral stenosis.Mean gradient 4 mmHg. Severe mitral  annular calcification.   4. The aortic valve was not well visualized. Aortic valve regurgitation  is not visualized. Mild aortic valve stenosis. Aortic valve mean gradient  measures 8.7 mmHg. Aortic valve peak  gradient measures 17.4 mmHg. Aortic  valve area, by VTI measures 1.54    Reproductive/Obstetrics negative OB ROS                             Anesthesia Physical Anesthesia Plan  ASA: 3  Anesthesia Plan: General   Post-op Pain Management:    Induction:   PONV Risk Score and Plan: Propofol infusion  Airway Management Planned:   Additional Equipment:   Intra-op Plan:   Post-operative Plan:   Informed Consent: I have reviewed the patients History and Physical, chart, labs and discussed the procedure including the risks, benefits and alternatives for the proposed anesthesia with the patient or authorized representative who has indicated his/her understanding and acceptance.     Dental Advisory Given  Plan Discussed with: CRNA  Anesthesia Plan Comments:        Anesthesia Quick Evaluation

## 2023-06-14 NOTE — Anesthesia Procedure Notes (Signed)
Date/Time: 06/14/2023 9:25 AM  Performed by: Julian Reil, CRNAPre-anesthesia Checklist: Patient identified, Emergency Drugs available, Suction available and Patient being monitored Patient Re-evaluated:Patient Re-evaluated prior to induction Oxygen Delivery Method: Nasal cannula Induction Type: IV induction Placement Confirmation: positive ETCO2

## 2023-06-14 NOTE — H&P (Signed)
Primary Care Physician:  Kerri Perches, MD Primary Gastroenterologist:  Dr. Tasia Catchings  Pre-Procedure History & Physical: HPI:  Renee Collins is a 82 y.o. female is here for a colonoscopy for colon cancer screening purposes.  Patient denies any family history of colorectal cancer.  No melena or hematochezia.  No abdominal pain No change in bowel habits.  Overall feels well from a GI standpoint.  Patient has unintentional weight loss and Anemia   2019 - One small polyp in the ascending colon. Biopsied. - Diverticulosis in the sigmoid colon. - Internal hemorrhoids. Repeat 5 year   Past Medical History:  Diagnosis Date   Anemia    Chronic back pain    Chronic kidney disease    Depression    GERD (gastroesophageal reflux disease)    Heart murmur    Hyperlipidemia    Hypertension    Hypothyroidism    Pre-diabetes     Past Surgical History:  Procedure Laterality Date   ANTERIOR CERVICAL DECOMP/DISCECTOMY FUSION N/A 09/15/2021   Procedure: Anterior Cervical Decompression Discectomy Fusion Cervical three-four, Cervical four-five;  Surgeon: Bedelia Person, MD;  Location: [facility];  Service: Neurosurgery;  Laterality: N/A;   BONE MARROW ASPIRATION Left 05/16/15   BONE MARROW BIOPSY Left 05/16/15   CATARACT EXTRACTION W/PHACO Right 02/21/2023   Procedure: CATARACT EXTRACTION PHACO AND INTRAOCULAR LENS PLACEMENT (IOC);  Surgeon: [name], MD;  Location: [facility];  Service: Ophthalmology;  Laterality: Right;  CDE: 7.42   CATARACT EXTRACTION W/PHACO Left 03/07/2023   Procedure: CATARACT EXTRACTION PHACO AND INTRAOCULAR LENS PLACEMENT (IOC);  Surgeon: [name], MD;  Location: [facility];  Service: Ophthalmology;  Laterality: Left;  CDE: 8.10   COLONOSCOPY  03/30/2012   Procedure: COLONOSCOPY;  Surgeon: Malissa Hippo, MD;  Location: [facility];  Service: Endoscopy;  Laterality: N/A;  730   COLONOSCOPY N/A 06/30/2017   Procedure: COLONOSCOPY;  Surgeon: Malissa Hippo, MD;  Location:  [facility];  Service: Endoscopy;  Laterality: N/A;  2:00   LAMINECTOMY  1980's   POLYPECTOMY  06/30/2017   Procedure: POLYPECTOMY;  Surgeon: Malissa Hippo, MD;  Location: [facility];  Service: Endoscopy;;  colon   SPINE SURGERY  approx 1983   dr Jeral Fruit   TOTAL ABDOMINAL HYSTERECTOMY  approx 1993   fibroids     Prior to Admission medications   Medication Sig Start Date End Date Taking? Authorizing Provider  amLODipine (NORVASC) 10 MG tablet Take 1 tablet (10 mg total) by mouth daily. 12/15/22  Yes Kerri Perches, MD  buPROPion (WELLBUTRIN XL) 150 MG 24 hr tablet Take 1 tablet (150 mg total) by mouth daily. 01/25/23  Yes Kerri Perches, MD  calcitRIOL (ROCALTROL) 0.25 MCG capsule Take 0.25 mcg by mouth every Monday, Wednesday, and Friday. 02/09/21  Yes [provider]  hydrochlorothiazide (HYDRODIURIL) 25 MG tablet Take 1 tablet (25 mg total) by mouth daily. 01/25/23  Yes Kerri Perches, MD  levothyroxine (SYNTHROID) 88 MCG tablet TAKE 1 TABLET BY MOUTH ONCE DAILY. 1/2 HOUR BEFORE FIRST MEAL OF THE DAY. TAKE WITH WATER. 03/22/23  Yes Kerri Perches, MD  omeprazole (PRILOSEC) 20 MG capsule Take 1 capsule (20 mg total) by mouth daily as needed (for acid reflux.). 06/29/22  Yes Gardenia Phlegm, MD  pravastatin (PRAVACHOL) 20 MG tablet Take 1 tablet (20 mg total) by mouth every evening. 11/23/22  Yes Kerri Perches, MD  acetaminophen (TYLENOL) 650 MG CR tablet Take 650 mg by  mouth every 8 (eight) hours as needed for pain.     [provider]  aspirin 81 MG EC tablet Take 81 mg by mouth daily. 07/01/17   Rehman, Joline Maxcy, MD  benazepril (LOTENSIN) 5 MG tablet Take 1 tablet (5 mg total) by mouth daily. 10/20/22   Kerri Perches, MD  Calcium-Magnesium-Vitamin D (CALCIUM 1200+D3 PO) Take 1 tablet by mouth 2 (two) times daily.    [provider]  diphenhydrAMINE (BENADRYL) 25 mg capsule Take 25 mg by mouth at bedtime as needed for allergies or sleep.     [provider]  Misc Natural Products (JOINT HEALTH PO) Take 1 capsule by mouth daily.    [provider]  Multiple Vitamin (MULTIVITAMIN WITH MINERALS) TABS tablet Take 1 tablet by mouth daily.    [provider]    Allergies as of 05/12/2023   (No Known Allergies)    Family History  Problem Relation Age of Onset   Heart attack Mother 96   Heart disease Mother    Cancer Mother        type unknown   Diabetes Mother    Kidney failure Father    Kidney disease Father    Myasthenia gravis Brother    Diabetes Sister    Breast cancer Sister 4   Heart disease Brother    Diabetes Sister    Sickle cell anemia Sister    Cancer Sister        unknown type    Social History   Socioeconomic History   Marital status: Divorced    Spouse name: Not on file   Number of children: 3   Years of education: Not on file   Highest education level: Not on file  Occupational History   Occupation: LPN  Tobacco Use   Smoking status: Never   Smokeless tobacco: Never  Vaping Use   Vaping status: Never Used  Substance and Sexual Activity   Alcohol use: No   Drug use: No   Sexual activity: Not Currently  Other Topics Concern   Not on file  Social History Narrative   Pt has 2 living adult children, 1 desease at age 18 secondary congential heart disease.   7 grandchildren.   Social Drivers of Corporate investment banker Strain: Low Risk  (04/15/2021)   Overall Financial Resource Strain (CARDIA)    Difficulty of Paying Living Expenses: Not hard at all  Food Insecurity: No Food Insecurity (04/15/2021)   Hunger Vital Sign    Worried About Running Out of Food in the Last Year: Never true    Ran Out of Food in the Last Year: Never true  Transportation Needs: Unmet Transportation Needs (04/15/2021)   PRAPARE - Administrator, Civil Service (Medical): Yes    Lack of Transportation (Non-Medical): Yes  Physical Activity: Insufficiently Active (04/15/2021)    Exercise Vital Sign    Days of Exercise per Week: 3 days    Minutes of Exercise per Session: 30 min  Stress: No Stress Concern Present (04/15/2021)   [employer] of Occupational Health - Occupational Stress Questionnaire    Feeling of Stress : Not at all  Social Connections: Moderately Integrated (04/15/2021)   Social Connection and Isolation Panel [NHANES]    Frequency of Communication with Friends and Family: More than three times a week    Frequency of Social Gatherings with Friends and Family: More than three times a week    Attends Religious Services: 1  to 4 times per year    Active Member of Clubs or Organizations: Yes    Attends Banker Meetings: 1 to 4 times per year    Marital Status: Divorced  Intimate Partner Violence: Not At Risk (04/15/2021)   Humiliation, Afraid, Rape, and Kick questionnaire    Fear of Current or Ex-Partner: No    Emotionally Abused: No    Physically Abused: No    Sexually Abused: No    Review of Systems: See HPI, otherwise negative ROS  Physical Exam: Vital signs in last 24 hours: Temp:  [97.9 F (36.6 C)] 97.9 F (36.6 C) (12/17 0801) Pulse Rate:  [98] 98 (12/17 0801) Resp:  [15] 15 (12/17 0801) BP: (134)/(69) 134/69 (12/17 0801) SpO2:  [100 %] 100 % (12/17 0801) Weight:  [82.1 kg] 82.1 kg (12/17 0801)   General:   Alert,  Well-developed, well-nourished, pleasant and cooperative in NAD Head:  Normocephalic and atraumatic. Eyes:  Sclera clear, no icterus.   Conjunctiva pink. Ears:  Normal auditory acuity. Nose:  No deformity, discharge,  or lesions. Msk:  Symmetrical without gross deformities. Normal posture. Extremities:  Without clubbing or edema. Neurologic:  Alert and  oriented x4;  grossly normal neurologically. Skin:  Intact without significant lesions or rashes. Psych:  Alert and cooperative. Normal mood and affect.  Impression/Plan: Renee Collins is here for a colonoscopy to be performed for colon cancer  screening purposes.  Patient also has new onset unintentional weight loss and anemia  The risks of the procedure including infection, bleed, or perforation as well as benefits, limitations, alternatives and imponderables have been reviewed with the patient. Questions have been answered. All parties agreeable.

## 2023-06-14 NOTE — Progress Notes (Unsigned)
[facility] [address], [place] [address]   CLINIC:  Medical Oncology/Hematology  PCP:  Kerri Perches, MD [address] [place] [address] [phone]   REASON FOR VISIT:  Follow-up for MGUS and normocytic anemia  CURRENT THERAPY: Intermittent Feraheme (last on 02/15/2020)  INTERVAL HISTORY:   Renee Collins 82 y.o. female returns for routine follow-up of MGUS and anemia.  She was last seen by Rojelio Brenner PA-C on 10/26/2022. At today's visit, she reports feeling fairly well, apart from some lingering fatigue after her colonoscopy this week. She denies any bleeding episodes such as epistaxis, hematemesis, hematochezia, or melena.   She denies any chest pain, dyspnea on exertion, or syncope. No new neurologic changes or vasomotor symptoms - she denies any neuropathy, vision changes, tinnitus, strokelike symptoms, dizziness, or headache. She denies any new bony pain. She has 50% energy and 75% appetite. She endorses that she is maintaining a stable weight.  ASSESSMENT & PLAN:  1.  IgG kappa MGUS: - Bone marrow biopsy on 05/16/2015 shows limited bone marrow sample with no increase in plasma cells in the very limited material present.  Cytogenetics-46, XX.  FISH panel was normal. - Immunofixation shows monoclonal protein with IgG kappa specificity - Bone survey on 02/22/2017 showed ill-defined calcified lesion in the distal right femur.  MRI of the right femur on 03/02/2017 showed no osseous lesion. - PET scan on 02/25/2020 was negative for any myeloma lesions or other malignancies. - Most recent skeletal survey (04/20/2023): No discrete pathologic osseous lesions in the axial or appendicular skeleton - No B symptoms or new bony pain - Most recent labs (04/20/2023):  M spike trending slightly upward at 2.2 Elevated kappa 199.1, normal lambda 19.8, elevated ratio 10.06.  LDH normal. - No CRAB features at this time (04/20/2023) - creatinine  1.79 (baseline CKD), calcium 9.9, Hgb 10.6/MCV 87.1 - PLAN:  Repeat MGUS/myeloma panel and RTC in 3 months  (since last labs were from October) - 24-hour urine/UPEP - Next skeletal survey due in 1 year (October 2025)  2.  Normocytic anemia: -This is from chronic kidney disease and relative iron deficiency.  She last received IV Feraheme on 02/15/2020 - Denies any bleeding per rectum or melena - Most recent labs (04/20/2023): Ferritin 121, iron saturation 24%.  Hgb 10.6/MCV 87.1. - Hemoglobin is at goal considering underlying CKD stage IIIb/IV - If patient has worsening anemia with Hgb < 10.0, will consider starting her on ESA - PLAN: No treatment needed at this time.  Repeat anemia panel in 6 months.   3.  CKD stage IIIb/IV: - Per note by Dr. Wolfgang Phoenix, CKD is secondary to diabetes mellitus - Creatinine is usually between 1.5 and 1.8 - Renal ultrasound on 01/24/2020 shows small right renal cyst with no additional findings. - PLAN: Patient to continue follow-up with Dr. Wolfgang Phoenix  4.  ABNORMAL COLONOSCOPY - Colonoscopy from 06/14/2023 with protruding soft large lesion of perianal region - Surgical pathology is pending. - PLAN: Not addressed during today's visit.  Will defer to GI at present.  If cancerous lesion detected, patient will need new patient referral to medical oncology.   PLAN SUMMARY: >> 24-hour urine/UPEP >> Labs in 3 months = CBC/D, CMP, LDH, SPEP, light chains, ferritin, iron/TIBC >> OFFICE visit in 3 months (1 week after lab)     REVIEW OF SYSTEMS:  Review of Systems  Constitutional:  Positive for fatigue. Negative for appetite change, chills, diaphoresis, fever and  unexpected weight change.  HENT:   Negative for lump/mass and nosebleeds.   Eyes:  Negative for eye problems.  Respiratory:  Negative for cough, hemoptysis and shortness of breath.   Cardiovascular:  Negative for chest pain, leg swelling and palpitations.  Gastrointestinal:  Positive for constipation.  Negative for abdominal pain, blood in stool, diarrhea, nausea and vomiting.  Genitourinary:  Negative for hematuria.   Skin: Negative.   Neurological:  Negative for dizziness, headaches and light-headedness.  Hematological:  Does not bruise/bleed easily.     PHYSICAL EXAM:  ECOG PERFORMANCE STATUS: 1 - Symptomatic but completely ambulatory  Vitals:   06/16/23 0838  BP: 136/68  Pulse: 93  Resp: 19  Temp: (!) 97.3 F (36.3 C)  SpO2: 100%   Filed Weights   06/16/23 0838  Weight: 180 lb (81.6 kg)   Physical Exam Constitutional:      Appearance: Normal appearance. She is obese.  Cardiovascular:     Heart sounds: Murmur heard.  Pulmonary:     Breath sounds: Normal breath sounds.  Neurological:     General: No focal deficit present.     Mental Status: Mental status is at baseline.  Psychiatric:        Behavior: Behavior normal. Behavior is cooperative.     PAST MEDICAL/SURGICAL HISTORY:  Past Medical History:  Diagnosis Date   Anemia    Chronic back pain    Chronic kidney disease    Depression    GERD (gastroesophageal reflux disease)    Heart murmur    Hyperlipidemia    Hypertension    Hypothyroidism    Pre-diabetes    Past Surgical History:  Procedure Laterality Date   ANTERIOR CERVICAL DECOMP/DISCECTOMY FUSION N/A 09/15/2021   Procedure: Anterior Cervical Decompression Discectomy Fusion Cervical three-four, Cervical four-five;  Surgeon: Bedelia Person, MD;  Location: [facility] OR;  Service: Neurosurgery;  Laterality: N/A;   BONE MARROW ASPIRATION Left 05/16/15   BONE MARROW BIOPSY Left 05/16/15   CATARACT EXTRACTION W/PHACO Right 02/21/2023   Procedure: CATARACT EXTRACTION PHACO AND INTRAOCULAR LENS PLACEMENT (IOC);  Surgeon: [name], MD;  Location: [facility];  Service: Ophthalmology;  Laterality: Right;  CDE: 7.42   CATARACT EXTRACTION W/PHACO Left 03/07/2023   Procedure: CATARACT EXTRACTION PHACO AND INTRAOCULAR LENS PLACEMENT (IOC);  Surgeon: [name],  MD;  Location: [facility];  Service: Ophthalmology;  Laterality: Left;  CDE: 8.10   COLONOSCOPY  03/30/2012   Procedure: COLONOSCOPY;  Surgeon: Malissa Hippo, MD;  Location: [facility];  Service: Endoscopy;  Laterality: N/A;  730   COLONOSCOPY N/A 06/30/2017   Procedure: COLONOSCOPY;  Surgeon: Malissa Hippo, MD;  Location: [facility];  Service: Endoscopy;  Laterality: N/A;  2:00   LAMINECTOMY  1980's   POLYPECTOMY  06/30/2017   Procedure: POLYPECTOMY;  Surgeon: Malissa Hippo, MD;  [facility];  Service: Endoscopy;;  colon   SPINE SURGERY  approx 1983   dr Jeral Fruit   TOTAL ABDOMINAL HYSTERECTOMY  approx 1993   fibroids     SOCIAL HISTORY:  Social History   Socioeconomic History   Marital status: Divorced    Spouse name: Not on file   Number of children: 3   Years of education: Not on file   Highest education level: Not on file  Occupational History   Occupation: LPN  Tobacco Use   Smoking status: Never   Smokeless tobacco: Never  Vaping Use   Vaping status: Never Used  Substance and Sexual  Activity   Alcohol use: No   Drug use: No   Sexual activity: Not Currently  Other Topics Concern   Not on file  Social History Narrative   Pt has 2 living adult children, 1 desease at age 44 secondary congential heart disease.   7 grandchildren.   Social Drivers of Corporate investment banker Strain: Low Risk  (04/15/2021)   Overall Financial Resource Strain (CARDIA)    Difficulty of Paying Living Expenses: Not hard at all  Food Insecurity: No Food Insecurity (04/15/2021)   Hunger Vital Sign    Worried About Running Out of Food in the Last Year: Never true    Ran Out of Food in the Last Year: Never true  Transportation Needs: Unmet Transportation Needs (04/15/2021)   PRAPARE - Administrator, Civil Service (Medical): Yes    Lack of Transportation (Non-Medical): Yes  Physical Activity: Insufficiently Active (04/15/2021)   Exercise Vital Sign    Days of  Exercise per Week: 3 days    Minutes of Exercise per Session: 30 min  Stress: No Stress Concern Present (04/15/2021)   [employer] of Occupational Health - Occupational Stress Questionnaire    Feeling of Stress : Not at all  Social Connections: Moderately Integrated (04/15/2021)   Social Connection and Isolation Panel [NHANES]    Frequency of Communication with Friends and Family: More than three times a week    Frequency of Social Gatherings with Friends and Family: More than three times a week    Attends Religious Services: 1 to 4 times per year    Active Member of [employer] or Organizations: Yes    Attends Banker Meetings: 1 to 4 times per year    Marital Status: Divorced  Catering manager Violence: Not At Risk (04/15/2021)   Humiliation, Afraid, Rape, and Kick questionnaire    Fear of Current or Ex-Partner: No    Emotionally Abused: No    Physically Abused: No    Sexually Abused: No    FAMILY HISTORY:  Family History  Problem Relation Age of Onset   Heart attack Mother 75   Heart disease Mother    Cancer Mother        type unknown   Diabetes Mother    Kidney failure Father    Kidney disease Father    Myasthenia gravis Brother    Diabetes Sister    Breast cancer Sister 91   Heart disease Brother    Diabetes Sister    Sickle cell anemia Sister    Cancer Sister        unknown type    CURRENT MEDICATIONS:  Outpatient Encounter Medications as of 06/16/2023  Medication Sig Note   acetaminophen (TYLENOL) 650 MG CR tablet Take 650 mg by mouth every 8 (eight) hours as needed for pain.     amLODipine (NORVASC) 10 MG tablet Take 1 tablet (10 mg total) by mouth daily.    aspirin 81 MG EC tablet Take 81 mg by mouth daily.    benazepril (LOTENSIN) 5 MG tablet Take 1 tablet (5 mg total) by mouth daily. 12/10/2022: To be prescribed by Nephrology, Dr Gracelyn Nurse only d/w pt on 12/10/2022   buPROPion (WELLBUTRIN XL) 150 MG 24 hr tablet Take 1 tablet (150 mg total) by mouth  daily.    calcitRIOL (ROCALTROL) 0.25 MCG capsule Take 0.25 mcg by mouth every Monday, Wednesday, and Friday.    Calcium-Magnesium-Vitamin D (CALCIUM 1200+D3 PO) Take 1 tablet by mouth 2 (  two) times daily.    diphenhydrAMINE (BENADRYL) 25 mg capsule Take 25 mg by mouth at bedtime as needed for allergies or sleep.    hydrochlorothiazide (HYDRODIURIL) 25 MG tablet Take 1 tablet (25 mg total) by mouth daily.    levothyroxine (SYNTHROID) 88 MCG tablet TAKE 1 TABLET BY MOUTH ONCE DAILY. 1/2 HOUR BEFORE FIRST MEAL OF THE DAY. TAKE WITH WATER.    Misc Natural Products (JOINT HEALTH PO) Take 1 capsule by mouth daily.    Multiple Vitamin (MULTIVITAMIN WITH MINERALS) TABS tablet Take 1 tablet by mouth daily.    omeprazole (PRILOSEC) 20 MG capsule Take 1 capsule (20 mg total) by mouth daily as needed (for acid reflux.). 12/10/2022: Takes on avg twice weekly reported 11/2022   pravastatin (PRAVACHOL) 20 MG tablet Take 1 tablet (20 mg total) by mouth every evening.    No facility-administered encounter medications on file as of 06/16/2023.    ALLERGIES:  No Known Allergies  LABORATORY DATA:  I have reviewed the labs as listed.  CBC    Component Value Date/Time   WBC 4.8 06/06/2023 1421   RBC 3.87 06/06/2023 1421   HGB 10.5 (L) 06/06/2023 1421   HGB 12.0 07/24/2021 1402   HCT 33.6 (L) 06/06/2023 1421   HCT 36.1 07/24/2021 1402   PLT 244 06/06/2023 1421   PLT 233 07/24/2021 1402   MCV 86.8 06/06/2023 1421   MCV 83 07/24/2021 1402   MCH 27.1 06/06/2023 1421   MCHC 31.3 06/06/2023 1421   RDW 13.4 06/06/2023 1421   RDW 12.4 07/24/2021 1402   LYMPHSABS 1.8 06/06/2023 1421   MONOABS 0.4 06/06/2023 1421   EOSABS 0.2 06/06/2023 1421   BASOSABS 0.0 06/06/2023 1421      Latest Ref Rng & Units 06/06/2023    2:21 PM 04/20/2023   10:29 AM 04/14/2023   11:32 AM  CMP  Glucose 70 - 99 mg/dL 191  98  99   BUN 8 - 23 mg/dL 25  30  28    Creatinine 0.44 - 1.00 mg/dL 4.78  2.95  6.21   Sodium 135 - 145  mmol/L 137  138  138   Potassium 3.5 - 5.1 mmol/L 3.7  3.3  4.0   Chloride 98 - 111 mmol/L 108  102  103   CO2 22 - 32 mmol/L 21  27  21    Calcium 8.9 - 10.3 mg/dL 9.4  9.9  9.9   Total Protein 6.5 - 8.1 g/dL  9.1  8.5   Total Bilirubin 0.3 - 1.2 mg/dL  0.4  0.4   Alkaline Phos 38 - 126 U/L  62  75   AST 15 - 41 U/L  23  18   ALT 0 - 44 U/L  18  14     DIAGNOSTIC IMAGING:  I have independently reviewed the relevant imaging and discussed with the patient.   WRAP UP:  All questions were answered. The patient knows to call the clinic with any problems, questions or concerns.  Medical decision making: Moderate  Time spent on visit: I spent 20 minutes counseling the patient face to face. The total time spent in the appointment was 30 minutes and more than 50% was on counseling.  Carnella Guadalajara, PA-C  06/16/23 9:07 AM

## 2023-06-15 ENCOUNTER — Encounter (INDEPENDENT_AMBULATORY_CARE_PROVIDER_SITE_OTHER): Payer: Self-pay | Admitting: *Deleted

## 2023-06-16 ENCOUNTER — Inpatient Hospital Stay: Payer: No Typology Code available for payment source | Attending: Physician Assistant | Admitting: Physician Assistant

## 2023-06-16 VITALS — BP 136/68 | HR 93 | Temp 97.3°F | Resp 19 | Ht 66.0 in | Wt 180.0 lb

## 2023-06-16 DIAGNOSIS — Z803 Family history of malignant neoplasm of breast: Secondary | ICD-10-CM | POA: Diagnosis not present

## 2023-06-16 DIAGNOSIS — K6289 Other specified diseases of anus and rectum: Secondary | ICD-10-CM | POA: Diagnosis not present

## 2023-06-16 DIAGNOSIS — D631 Anemia in chronic kidney disease: Secondary | ICD-10-CM | POA: Insufficient documentation

## 2023-06-16 DIAGNOSIS — E1122 Type 2 diabetes mellitus with diabetic chronic kidney disease: Secondary | ICD-10-CM | POA: Insufficient documentation

## 2023-06-16 DIAGNOSIS — N184 Chronic kidney disease, stage 4 (severe): Secondary | ICD-10-CM | POA: Diagnosis not present

## 2023-06-16 DIAGNOSIS — N1832 Chronic kidney disease, stage 3b: Secondary | ICD-10-CM | POA: Diagnosis not present

## 2023-06-16 DIAGNOSIS — D509 Iron deficiency anemia, unspecified: Secondary | ICD-10-CM | POA: Insufficient documentation

## 2023-06-16 DIAGNOSIS — D500 Iron deficiency anemia secondary to blood loss (chronic): Secondary | ICD-10-CM | POA: Diagnosis not present

## 2023-06-16 DIAGNOSIS — D472 Monoclonal gammopathy: Secondary | ICD-10-CM | POA: Diagnosis not present

## 2023-06-16 LAB — SURGICAL PATHOLOGY

## 2023-06-16 NOTE — Patient Instructions (Signed)
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  MGUS: As we discussed, this is an abnormal protein in your blood.  In some cases, it can progress to a type of cancer known as multiple myeloma.  You do not show any signs of progression at this time, but we will continue to monitor you with repeat labs and an office visit in 3 months.     We will also check a 24-hour urine study.  Kit has been provided for you. FIRST MORNING: Discard first urine of the morning into the toilet. Collect the rest of your urine in the orange jug for the next 24 hours. SECOND MORNING: Collect your first urine of the morning in the jug.  This ends your 24-hour urine collection. **Store the urine jug in the refrigerator but is not being used. Return urine jug to fourth floor front desk as soon as it is completed.   ANEMIA: Your blood count is slightly lower than normal, which is related to your underlying chronic kidney disease.  At this time, you do not need any treatment for your anemia, but we will continue to monitor it.   FOLLOW-UP APPOINTMENT: Office visit in 3 months, after labs    ** Thank you for trusting me with your healthcare!  I strive to provide all of my patients with quality care at each visit.  If you receive a survey for this visit, I would be so grateful to you for taking the time to provide feedback.  Thank you in advance!  ~ Michaeljohn Biss                   Dr. Doreatha Massed   &   Rojelio Brenner, PA-C   - - - - - - - - - - - - - - - - - -     Thank you for choosing [facility] at [facility] to provide your oncology and hematology care.  To afford each patient quality time with our provider, please arrive at least 15 minutes before your scheduled appointment time.   If you have a lab appointment with the Cancer  Center please come in thru the Main Entrance and check in at the main information desk.  You need to re-schedule your appointment should you arrive 10 or more minutes late.  We strive to give you quality time with our providers, and arriving late affects you and other patients whose appointments are after yours.  Also, if you no show three or more times for appointments you may be dismissed from the clinic at the providers discretion.     Again, thank you for choosing [facility].  Our hope is that these requests will decrease the amount of time that you wait before being seen by our physicians.       _____________________________________________________________  Should you have questions after your visit to [facility], please contact our office at [phone] and follow the prompts.  Our office hours are 8:00 a.m. and 4:30 p.m. Monday - Friday.  Please note that voicemails left after 4:00 p.m. may not be returned until the following business day.  We are closed weekends and major holidays.  You do have access to a nurse 24-7, just call the main number to the clinic [phone] and do not press any  options, hold on the line and a nurse will answer the phone.    For prescription refill requests, have your pharmacy contact our office and allow 72 hours.    Due to Covid, you will need to wear a mask upon entering the hospital. If you do not have a mask, a mask will be given to you at the Main Entrance upon arrival. For doctor visits, patients may have 1 support person age 38 or older with them. For treatment visits, patients can not have anyone with them due to social distancing guidelines and our immunocompromised population.

## 2023-06-17 NOTE — Anesthesia Postprocedure Evaluation (Signed)
Anesthesia Post Note  Patient: Renee Collins  Procedure(s) Performed: COLONOSCOPY WITH PROPOFOL POLYPECTOMY  Patient location during evaluation: Phase II Anesthesia Type: General Level of consciousness: awake Pain management: pain level controlled Vital Signs Assessment: post-procedure vital signs reviewed and stable Respiratory status: spontaneous breathing and respiratory function stable Cardiovascular status: blood pressure returned to baseline and stable Postop Assessment: no headache and no apparent nausea or vomiting Anesthetic complications: no Comments: Late entry   No notable events documented.   Last Vitals:  Vitals:   06/14/23 0954 06/14/23 0957  BP: (!) 97/51 (!) 108/59  Pulse: 83   Resp: (!) 22   Temp: 36.4 C   SpO2: 99%     Last Pain:  Vitals:   06/15/23 1449  TempSrc:   PainSc: 0-No pain                 Windell Norfolk

## 2023-06-19 NOTE — Assessment & Plan Note (Signed)
Controlled, no change in medication Updated lab needed at/ before next visit.  

## 2023-06-19 NOTE — Assessment & Plan Note (Signed)
Followed by Oncology

## 2023-06-19 NOTE — Progress Notes (Signed)
Renee Collins     MRN: [id number]      DOB: [birth year]  Chief Complaint  Patient presents with   Follow-up    Follow up    HPI Renee Collins is here for follow up and re-evaluation of chronic medical conditions, medication management and review of any available recent lab and radiology data.  Preventive health is updated, specifically  Cancer screening and Immunization.   Questions or concerns regarding consultations or procedures which the PT has had in the interim are  addressed. The PT denies any adverse reactions to current medications since the last visit.  There are no new concerns.  There are no specific complaints   ROS Denies recent fever or chills. Denies sinus pressure, nasal congestion, ear pain or sore throat. Denies chest congestion, productive cough or wheezing. Denies chest pains, palpitations and leg swelling Denies abdominal pain, nausea, vomiting,diarrhea or constipation.   Denies dysuria, frequency, hesitancy or incontinence. Denies joint pain, swelling and limitation in mobility. Denies headaches, seizures, numbness, or tingling. Denies depression, anxiety or insomnia. Denies skin break down or rash.   PE  BP 133/75 (BP Location: Right Arm, Patient Position: Sitting, Cuff Size: Large)   Pulse 98   Ht 5\' 6"  (1.676 m)   Wt 181 lb 1.3 oz (82.1 kg)   SpO2 96%   BMI 29.23 kg/m   Patient alert and oriented and in no cardiopulmonary distress.  HEENT: No facial asymmetry, EOMI,     Neck supple .  Chest: Clear to auscultation bilaterally.  CVS: S1, S2 no murmurs, no S3.Regular rate.  ABD: Soft non tender.   Ext: No edema  MS: Adequate  though reduced ROM spine, shoulders, hips and knees.  Skin: Intact, no ulcerations or rash noted.  Psych: Good eye contact, normal affect. Memory intact not anxious or depressed appearing.  CNS: CN 2-12 intact, power,  normal throughout.no focal deficits noted.   Assessment & Plan  Essential  hypertension SBP above goal, will monitor at home, no med change  DASH diet and commitment to daily physical activity for a minimum of 30 minutes discussed and encouraged, as a part of hypertension management. The importance of attaining a healthy weight is also discussed.     06/16/2023    8:38 AM 06/14/2023    9:57 AM 06/14/2023    9:54 AM 06/14/2023    8:01 AM 06/06/2023    2:15 PM 06/02/2023   11:01 AM 04/14/2023   10:08 AM  BP/Weight  Systolic BP 136 108 97 134 136 133 139  Diastolic BP 68 59 51 69 75 75 74  Wt. (Lbs) 180   181 181.08 181.08 179  BMI 29.05 kg/m2   29.21 kg/m2 29.23 kg/m2 29.23 kg/m2 28.89 kg/m2       Hypothyroid Controlled, no change in medication Updated lab needed at/ before next visit.   Unspecified vitamin D deficiency Updated lab needed at/ before next visit.   Overweight (BMI 25.0-29.9)  Patient re-educated about  the importance of commitment to a  minimum of 150 minutes of exercise per week as able.  The importance of healthy food choices with portion control discussed, as well as eating regularly and within a 12 hour window most days. The need to choose "clean , green" food 50 to 75% of the time is discussed, as well as to make water the primary drink and set a goal of 64 ounces water daily.       06/16/2023  8:38 AM 06/14/2023    8:01 AM 06/06/2023    2:15 PM  Weight /BMI  Weight 180 lb 181 lb 181 lb 1.3 oz  Height 5\' 6"  (1.676 m) 5\' 6"  (1.676 m) 5\' 6"  (1.676 m)  BMI 29.05 kg/m2 29.21 kg/m2 29.23 kg/m2      Dyslipidemia Hyperlipidemia:Low fat diet discussed and encouraged.   Lipid Panel  Lab Results  Component Value Date   CHOL 139 12/10/2022   HDL 64 12/10/2022   LDLCALC 61 12/10/2022   TRIG 69 12/10/2022   CHOLHDL 2.2 12/10/2022     Controlled, no change in medication Updated lab needed at/ before next visit.   Depression, major, single episode, in partial remission (HCC) Controlled, no change in  medication   Type 2 diabetes mellitus with other specified complication (HCC) Diabetes associated with hypertension, hyperlipidemia, CKD, and depression  Renee Collins is reminded of the importance of commitment to daily physical activity for 30 minutes or more, as able and the need to limit carbohydrate intake to 30 to 60 grams per meal to help with blood sugar control.      Renee Collins is reminded of the importance of daily foot exam, annual eye examination, and good blood sugar, blood pressure and cholesterol control.     Latest Ref Rng & Units 06/06/2023    2:21 PM 06/02/2023   11:45 AM 04/20/2023   10:29 AM 04/14/2023   11:32 AM 12/10/2022   11:06 AM  Diabetic Labs  HbA1c 4.8 - 5.6 %    6.6  6.6   Micro/Creat Ratio 0 - 29 mg/g creat  91      Chol 100 - 199 mg/dL     272   HDL >53 mg/dL     64   Calc LDL 0 - 99 mg/dL     61   Triglycerides 0 - 149 mg/dL     69   Creatinine 6.64 - 1.00 mg/dL 4.03   4.74  2.59  5.63       06/16/2023    8:38 AM 06/14/2023    9:57 AM 06/14/2023    9:54 AM 06/14/2023    8:01 AM 06/06/2023    2:15 PM 06/02/2023   11:01 AM 04/14/2023   10:08 AM  BP/Weight  Systolic BP 136 108 97 134 136 133 139  Diastolic BP 68 59 51 69 75 75 74  Wt. (Lbs) 180   181 181.08 181.08 179  BMI 29.05 kg/m2   29.21 kg/m2 29.23 kg/m2 29.23 kg/m2 28.89 kg/m2      Latest Ref Rng & Units 12/08/2022   12:00 AM 02/23/2022    9:20 AM  Foot/eye exam completion dates  Eye Exam No Retinopathy No Retinopathy       Foot Form Completion   Done     This result is from an external source.        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

## 2023-06-19 NOTE — Assessment & Plan Note (Signed)
Managed by Nephrology, stable °

## 2023-06-19 NOTE — Assessment & Plan Note (Signed)
Controlled, no change in medication  

## 2023-06-19 NOTE — Assessment & Plan Note (Signed)
Updated lab needed at/ before next visit.   

## 2023-06-19 NOTE — Assessment & Plan Note (Signed)
SBP above goal, will monitor at home, no med change  DASH diet and commitment to daily physical activity for a minimum of 30 minutes discussed and encouraged, as a part of hypertension management. The importance of attaining a healthy weight is also discussed.     06/16/2023    8:38 AM 06/14/2023    9:57 AM 06/14/2023    9:54 AM 06/14/2023    8:01 AM 06/06/2023    2:15 PM 06/02/2023   11:01 AM 04/14/2023   10:08 AM  BP/Weight  Systolic BP 136 108 97 134 136 133 139  Diastolic BP 68 59 51 69 75 75 74  Wt. (Lbs) 180   181 181.08 181.08 179  BMI 29.05 kg/m2   29.21 kg/m2 29.23 kg/m2 29.23 kg/m2 28.89 kg/m2

## 2023-06-19 NOTE — Assessment & Plan Note (Signed)
Hyperlipidemia:Low fat diet discussed and encouraged.   Lipid Panel  Lab Results  Component Value Date   CHOL 139 12/10/2022   HDL 64 12/10/2022   LDLCALC 61 12/10/2022   TRIG 69 12/10/2022   CHOLHDL 2.2 12/10/2022     Controlled, no change in medication Updated lab needed at/ before next visit.

## 2023-06-19 NOTE — Assessment & Plan Note (Addendum)
Diabetes associated with hypertension, hyperlipidemia, CKD, and depression  Ms. Marrison is reminded of the importance of commitment to daily physical activity for 30 minutes or more, as able and the need to limit carbohydrate intake to 30 to 60 grams per meal to help with blood sugar control.      Ms. Esbenshade is reminded of the importance of daily foot exam, annual eye examination, and good blood sugar, blood pressure and cholesterol control.     Latest Ref Rng & Units 06/06/2023    2:21 PM 06/02/2023   11:45 AM 04/20/2023   10:29 AM 04/14/2023   11:32 AM 12/10/2022   11:06 AM  Diabetic Labs  HbA1c 4.8 - 5.6 %    6.6  6.6   Micro/Creat Ratio 0 - 29 mg/g creat  91      Chol 100 - 199 mg/dL     657   HDL >84 mg/dL     64   Calc LDL 0 - 99 mg/dL     61   Triglycerides 0 - 149 mg/dL     69   Creatinine 6.96 - 1.00 mg/dL 2.95   2.84  1.32  4.40       06/16/2023    8:38 AM 06/14/2023    9:57 AM 06/14/2023    9:54 AM 06/14/2023    8:01 AM 06/06/2023    2:15 PM 06/02/2023   11:01 AM 04/14/2023   10:08 AM  BP/Weight  Systolic BP 136 108 97 134 136 133 139  Diastolic BP 68 59 51 69 75 75 74  Wt. (Lbs) 180   181 181.08 181.08 179  BMI 29.05 kg/m2   29.21 kg/m2 29.23 kg/m2 29.23 kg/m2 28.89 kg/m2      Latest Ref Rng & Units 12/08/2022   12:00 AM 02/23/2022    9:20 AM  Foot/eye exam completion dates  Eye Exam No Retinopathy No Retinopathy       Foot Form Completion   Done     This result is from an external source.

## 2023-06-19 NOTE — Assessment & Plan Note (Signed)
  Patient re-educated about  the importance of commitment to a  minimum of 150 minutes of exercise per week as able.  The importance of healthy food choices with portion control discussed, as well as eating regularly and within a 12 hour window most days. The need to choose "clean , green" food 50 to 75% of the time is discussed, as well as to make water the primary drink and set a goal of 64 ounces water daily.       06/16/2023    8:38 AM 06/14/2023    8:01 AM 06/06/2023    2:15 PM  Weight /BMI  Weight 180 lb 181 lb 181 lb 1.3 oz  Height 5\' 6"  (1.676 m) 5\' 6"  (1.676 m) 5\' 6"  (1.676 m)  BMI 29.05 kg/m2 29.21 kg/m2 29.23 kg/m2

## 2023-06-20 ENCOUNTER — Encounter (INDEPENDENT_AMBULATORY_CARE_PROVIDER_SITE_OTHER): Payer: Self-pay | Admitting: *Deleted

## 2023-06-20 ENCOUNTER — Encounter (HOSPITAL_COMMUNITY): Payer: Self-pay | Admitting: Gastroenterology

## 2023-06-20 NOTE — Progress Notes (Signed)
I reviewed the pathology results. [name], can you send her a letter with the findings as described below please?  Thanks,  Renee Lawman, MD Gastroenterology and Hepatology [facility]  ---------------------------------------------------------------------------------------------  [facility] [address], [place], [place] [address] Phone:  [phone]   06/20/23 [place], [place]   Dear Renee Collins,  I am writing to inform you that the biopsies taken during your recent endoscopic examination showed:  I am writing to let you know the results of your recent colonoscopy.  You had a total of 1 polyp removed. 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  I strongly recommend to follow up with colorectal surgeon for the anal protrusion to be evaluated . Referral was sent .  Also I value your feedback , so if you get a survey , please take the time to fill it out and thank you for choosing [facility]  Please call us at [phone] if you have persistent problems or have questions about your condition that have not been fully answered at this time.  Sincerely,  Renee Lawman, MD Gastroenterology and Hepatology

## 2023-06-21 ENCOUNTER — Other Ambulatory Visit: Payer: Self-pay

## 2023-06-21 DIAGNOSIS — D500 Iron deficiency anemia secondary to blood loss (chronic): Secondary | ICD-10-CM

## 2023-06-21 DIAGNOSIS — D472 Monoclonal gammopathy: Secondary | ICD-10-CM

## 2023-06-21 DIAGNOSIS — N1832 Chronic kidney disease, stage 3b: Secondary | ICD-10-CM

## 2023-06-21 DIAGNOSIS — D631 Anemia in chronic kidney disease: Secondary | ICD-10-CM

## 2023-06-27 LAB — UPEP/UIFE/LIGHT CHAINS/TP, 24-HR UR
% BETA, Urine: 15 %
ALPHA 1 URINE: 2.8 %
Albumin, U: 59.2 %
Alpha 2, Urine: 6.9 %
Free Kappa Lt Chains,Ur: 116.71 mg/L — ABNORMAL HIGH (ref 1.17–86.46)
Free Kappa/Lambda Ratio: 25.48 — ABNORMAL HIGH (ref 1.83–14.26)
Free Lambda Lt Chains,Ur: 4.58 mg/L (ref 0.27–15.21)
GAMMA GLOBULIN URINE: 16.1 %
M-SPIKE %, Urine: 3.3 % — ABNORMAL HIGH
M-Spike, Mg/24 Hr: 8 mg/(24.h) — ABNORMAL HIGH
Total Protein, Urine-Ur/day: 243 mg/(24.h) — ABNORMAL HIGH (ref 30–150)
Total Protein, Urine: 24.3 mg/dL
Total Volume: 1000

## 2023-07-04 ENCOUNTER — Ambulatory Visit (HOSPITAL_COMMUNITY)
Admission: RE | Admit: 2023-07-04 | Discharge: 2023-07-04 | Disposition: A | Discharge status: Home / Self Care | Payer: No Typology Code available for payment source | Source: Ambulatory Visit | Attending: Family Medicine | Admitting: Family Medicine

## 2023-07-04 DIAGNOSIS — Z1231 Encounter for screening mammogram for malignant neoplasm of breast: Secondary | ICD-10-CM | POA: Insufficient documentation

## 2023-07-11 ENCOUNTER — Ambulatory Visit: Payer: Self-pay | Admitting: Surgery

## 2023-07-11 DIAGNOSIS — I350 Nonrheumatic aortic (valve) stenosis: Secondary | ICD-10-CM | POA: Diagnosis not present

## 2023-07-11 DIAGNOSIS — K643 Fourth degree hemorrhoids: Secondary | ICD-10-CM | POA: Diagnosis not present

## 2023-07-11 DIAGNOSIS — Z860101 Personal history of adenomatous and serrated colon polyps: Secondary | ICD-10-CM | POA: Diagnosis not present

## 2023-07-22 ENCOUNTER — Encounter (INDEPENDENT_AMBULATORY_CARE_PROVIDER_SITE_OTHER): Payer: Self-pay | Admitting: Gastroenterology

## 2023-07-22 ENCOUNTER — Ambulatory Visit (INDEPENDENT_AMBULATORY_CARE_PROVIDER_SITE_OTHER): Payer: No Typology Code available for payment source | Admitting: Gastroenterology

## 2023-07-22 VITALS — BP 125/77 | HR 88 | Temp 97.1°F | Ht 66.0 in | Wt 173.5 lb

## 2023-07-22 DIAGNOSIS — Z860101 Personal history of adenomatous and serrated colon polyps: Secondary | ICD-10-CM

## 2023-07-22 DIAGNOSIS — D649 Anemia, unspecified: Secondary | ICD-10-CM | POA: Diagnosis not present

## 2023-07-22 DIAGNOSIS — R6881 Early satiety: Secondary | ICD-10-CM

## 2023-07-22 DIAGNOSIS — K629 Disease of anus and rectum, unspecified: Secondary | ICD-10-CM | POA: Insufficient documentation

## 2023-07-22 DIAGNOSIS — R634 Abnormal weight loss: Secondary | ICD-10-CM

## 2023-07-22 DIAGNOSIS — D122 Benign neoplasm of ascending colon: Secondary | ICD-10-CM

## 2023-07-22 NOTE — Patient Instructions (Signed)
It was very nice to meet you today, as dicussed with will plan for the following :  1) follow up with colorectal surgery, once you decide on surgery  2) CT abdomen and pelvis to be done after kidney doctor clearance

## 2023-07-22 NOTE — Progress Notes (Signed)
Vista Lawman , M.D. Gastroenterology & Hepatology [facility] Gastroenterology [address] 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  She always gets colonoscopy scheduled directly  Patient recently had screening colonoscopy performed.  She was found to have a protruding anal lesion and was referred to colorectal surgery (full consult scan in media) was seen by Dr. Viviann Spare gross was impression was this to be chronic grade 4 hemorrhoids with possibility of adenomatous tissue .  Recommended hemorrhoidal ligation and pexy and hemorrhoidectomy  Patient has chronic normocytic anemia and follows with hematology.  She does have unintentional weight loss but attributes to having significant dental work and not having much appetite because of that.  Patient still deciding on rectal surgery as she has a cruise planned and does not want any interference with her vacation plans  Last VHQ:IONG Last Colonoscopy:05/2023  - Abnormal perianal exam. Protruding soft large lesion - One 3 mm polyp in the ascending colon, removed with a cold snare. Resected and retrieved. - Non- bleeding external and internal hemorrhoids.  A. COLON, ASCENDING, POLYPECTOMY:  -  Tubular adenoma.   Past Medical History: Past Medical History:  Diagnosis Date   Anemia    Chronic back pain    Chronic kidney disease    Depression    GERD (gastroesophageal reflux disease)    Heart murmur    Hyperlipidemia    Hypertension     Hypothyroidism    Pre-diabetes     Past Surgical History: Past Surgical History:  Procedure Laterality Date   ANTERIOR CERVICAL DECOMP/DISCECTOMY FUSION N/A 09/15/2021   Procedure: Anterior Cervical Decompression Discectomy Fusion Cervical three-four, Cervical four-five;  Surgeon: Bedelia Person, MD;  Location: [facility] OR;  Service: Neurosurgery;  Laterality: N/A;   BONE MARROW ASPIRATION Left 05/16/15   BONE MARROW BIOPSY Left 05/16/15   CATARACT EXTRACTION W/PHACO Right 02/21/2023   Procedure: CATARACT EXTRACTION PHACO AND INTRAOCULAR LENS PLACEMENT (IOC);  Surgeon: [name], MD;  Location: [facility] ORS;  Service: Ophthalmology;  Laterality: Right;  CDE: 7.42   CATARACT EXTRACTION W/PHACO Left 03/07/2023   Procedure: CATARACT EXTRACTION PHACO AND INTRAOCULAR LENS PLACEMENT (IOC);  Surgeon: [name], MD;  Location: [facility] ORS;  Service: Ophthalmology;  Laterality: Left;  CDE: 8.10   COLONOSCOPY  03/30/2012   Procedure: COLONOSCOPY;  Surgeon: Malissa Hippo, MD;  Location: [facility] ENDO SUITE;  Service: Endoscopy;  Laterality: N/A;  730   COLONOSCOPY N/A 06/30/2017   Procedure: COLONOSCOPY;  Surgeon: Malissa Hippo, MD;  Location: [facility] END[facility] SUITE;  Service: Endoscopy;  Laterality: N/A;  2:00   COLONOSCOPY WITH PROPOFOL N/A 06/14/2023   Procedure: COLONOSCOPY WITH PROPOFOL;  Surgeon: Franky Macho, MD;  Location: [facility] SU[facility];  Service: Endoscopy;  Laterality: N/A;   LAMINECTOMY  1980's   POLYPECTOMY  06/30/2017   Procedure: POLYPECTOMY;  Surgeon: Malissa Hippo, MD;  Location: [facility] SU[facility];  Service: Endoscopy;;  colon   POLYPECTOMY  06/14/2023   Procedure: POLYPECTOMY;  Surgeon: Franky Macho, MD;  Location: [facility] END[facility]  SUITE;  Service: Endoscopy;;   SPINE SURGERY  approx 1983   dr Jeral Fruit   TOTAL ABDOMINAL HYSTERECTOMY  approx 1993   fibroids     Family History: Family History  Problem Relation Age of Onset   Heart attack Mother 64   Heart disease Mother    Cancer Mother        type  unknown   Diabetes Mother    Kidney failure Father    Kidney disease Father    Myasthenia gravis Brother    Diabetes Sister    Breast cancer Sister 15   Heart disease Brother    Diabetes Sister    Sickle cell anemia Sister    Cancer Sister        unknown type    Social History: Social History   Tobacco Use  Smoking Status Never  Smokeless Tobacco Never   Social History   Substance and Sexual Activity  Alcohol Use No   Social History   Substance and Sexual Activity  Drug Use No    Allergies: No Known Allergies  Medications: Current Outpatient Medications  Medication Sig Dispense Refill   acetaminophen (TYLENOL) 650 MG CR tablet Take 650 mg by mouth every 8 (eight) hours as needed for pain.      amLODipine (NORVASC) 10 MG tablet Take 1 tablet (10 mg total) by mouth daily. 100 tablet 2   aspirin 81 MG EC tablet Take 81 mg by mouth daily. 30 tablet 12   benazepril (LOTENSIN) 5 MG tablet Take 1 tablet (5 mg total) by mouth daily. 30 tablet 2   buPROPion (WELLBUTRIN XL) 150 MG 24 hr tablet Take 1 tablet (150 mg total) by mouth daily. 90 tablet 3   calcitRIOL (ROCALTROL) 0.25 MCG capsule Take 0.25 mcg by mouth every Monday, Wednesday, and Friday.     Calcium-Magnesium-Vitamin D (CALCIUM 1200+D3 PO) Take 1 tablet by mouth 2 (two) times daily.     diphenhydrAMINE (BENADRYL) 25 mg capsule Take 25 mg by mouth at bedtime as needed for allergies or sleep.     hydrochlorothiazide (HYDRODIURIL) 25 MG tablet Take 1 tablet (25 mg total) by mouth daily. 100 tablet 2   levothyroxine (SYNTHROID) 88 MCG tablet TAKE 1 TABLET BY MOUTH ONCE DAILY. 1/2 HOUR BEFORE FIRST MEAL OF THE DAY. TAKE WITH WATER. 100 tablet 2   Multiple Vitamin (MULTIVITAMIN WITH MINERALS) TABS tablet Take 1 tablet by mouth daily.     omeprazole (PRILOSEC) 20 MG capsule Take 1 capsule (20 mg total) by mouth daily as needed (for acid reflux.). 90 capsule 1   pravastatin (PRAVACHOL) 20 MG tablet Take 1 tablet (20 mg  total) by mouth every evening. 100 tablet 2   No current facility-administered medications for this visit.    Review of Systems: GENERAL: negative for malaise, night sweats HEENT: No changes in hearing or vision, no nose bleeds or other nasal problems. NECK: Negative for lumps, goiter, pain and significant neck swelling RESPIRATORY: Negative for cough, wheezing CARDIOVASCULAR: Negative for chest pain, leg swelling, palpitations, orthopnea GI: SEE HPI MUSCULOSKELETAL: Negative for joint pain or swelling, back pain, and muscle pain. SKIN: Negative for lesions, rash HEMATOLOGY Negative for prolonged bleeding, bruising easily, and swollen nodes. ENDOCRINE: Negative for cold or heat intolerance, polyuria, polydipsia and goiter. NEURO: negative for tremor, gait imbalance, syncope and seizures. The remainder of the review of systems is noncontributory.   Physical Exam: There were no vitals taken for this visit. GENERAL: The patient is AO x3, in  no acute distress. HEENT: Head is normocephalic and atraumatic. EOMI are intact. Mouth is well hydrated and without lesions. NECK: Supple. No masses LUNGS: Clear to auscultation. No presence of rhonchi/wheezing/rales. Adequate chest expansion HEART: RRR, normal s1 and s2. ABDOMEN: Soft, nontender, no guarding, no peritoneal signs, and nondistended. BS +. No masses.    Imaging/Labs: as above     Latest Ref Rng & Units 06/06/2023    2:21 PM 04/20/2023   10:29 AM 10/19/2022    8:46 AM  CBC  WBC 4.0 - 10.5 K/uL 4.8  5.2  5.6   Hemoglobin 12.0 - 15.0 g/dL 16.1  09.6  04.5   Hematocrit 36.0 - 46.0 % 33.6  35.1  35.8   Platelets 150 - 400 K/uL 244  212  227    Lab Results  Component Value Date   IRON 72 04/20/2023   TIBC 297 04/20/2023   FERRITIN 121 04/20/2023    I personally reviewed and interpreted the available labs, imaging and endoscopic files.  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  She was found to have a protruding anal lesion and was referred to colorectal surgery (full consult scan in media) was seen by Dr. Viviann Spare gross was impression was this to be chronic grade 4 hemorrhoids with possibility of adenomatous tissue .  Recommended hemorrhoidal ligation and pexy and hemorrhoidectomy  I reviewed her colonoscopy report from 2019 and this rectal lesion was not identified or seen.  Again I discussed with patient that I am concerned about this lesion and do recommend following up with surgery, as I cannot rule out malignancy here .  Patient still deciding on rectal surgery as she has a cruise planned and does not want any interference with her vacation plans  Patient will follow-up with colorectal surgery once she decides on surgical intervention  #Normocytic anemia #Unintentional weight loss  Patient has chronic normocytic anemia and follows with hematology.  She does have unintentional weight loss but attributes to having significant dental work and not having much appetite because of that.  As per oncology note patient normocytic anemia is likely from chronic kidney disease and relative iron deficiency  Patient attributes loss of appetite and unintentional weight loss because of recent dental work with significant pain and discomfort.  This could be very well the reason for her weight loss but given advanced age I cannot rule out any other upper GI malignancy or other organ malignancy without completing workup with upper endoscopy and CT scan  Patient would like to defer upper endoscopy at this time and again does verbalizes understanding that this may mean missing a lesion such as cancer  She would like to proceed with CT abdomen pelvis for now  As she has chronic kidney disease we  will reached out to nephrologist for clearance first before giving her IV contrast as CT abdomen pelvis without contrast would not be optimal imaging  #Colonic polyps   Last colonoscopy with 1 diminutive polyp, turned out to be tubular adenoma.  Suggested repeat 7 years but As per [place]  Multi-Society Task Force on Colorectal Cancer recommendation CRC screening is not recommended after age 58.  This may depend on results of tissue diagnosis of anal protrusion  RTC after CT scan   All questions were  answered.      Vista Lawman, MD Gastroenterology and Hepatology [facility] Gastroenterology   This chart has been completed using [facility] Dictation software, and while attempts have been made to ensure accuracy , certain words and phrases may not be transcribed as intended

## 2023-07-25 ENCOUNTER — Telehealth (INDEPENDENT_AMBULATORY_CARE_PROVIDER_SITE_OTHER): Payer: Self-pay | Admitting: Gastroenterology

## 2023-07-25 DIAGNOSIS — R634 Abnormal weight loss: Secondary | ICD-10-CM

## 2023-07-25 NOTE — Telephone Encounter (Signed)
Fax sent via email to [phone]  Requesting nephrology clearance for pt to have CT Abdomen/Pelvis with IV contrast for unintentional weight loss Per Dr.Ahmed-As she has chronic kidney disease we will reached out to nephrologist for clearance first before giving her IV contrast as CT abdomen pelvis without contrast would not be optimal imaging   Please advise. Thank you!

## 2023-08-17 NOTE — Telephone Encounter (Signed)
Resent clearance via fax

## 2023-08-23 ENCOUNTER — Ambulatory Visit (INDEPENDENT_AMBULATORY_CARE_PROVIDER_SITE_OTHER): Payer: No Typology Code available for payment source | Admitting: Podiatry

## 2023-08-23 ENCOUNTER — Encounter: Payer: Self-pay | Admitting: Podiatry

## 2023-08-23 VITALS — Ht 66.0 in | Wt 173.5 lb

## 2023-08-23 DIAGNOSIS — M79675 Pain in left toe(s): Secondary | ICD-10-CM | POA: Diagnosis not present

## 2023-08-23 DIAGNOSIS — L84 Corns and callosities: Secondary | ICD-10-CM

## 2023-08-23 DIAGNOSIS — E0822 Diabetes mellitus due to underlying condition with diabetic chronic kidney disease: Secondary | ICD-10-CM

## 2023-08-23 DIAGNOSIS — B351 Tinea unguium: Secondary | ICD-10-CM

## 2023-08-23 DIAGNOSIS — N1832 Chronic kidney disease, stage 3b: Secondary | ICD-10-CM

## 2023-08-23 DIAGNOSIS — M79674 Pain in right toe(s): Secondary | ICD-10-CM

## 2023-08-23 DIAGNOSIS — Z794 Long term (current) use of insulin: Secondary | ICD-10-CM | POA: Diagnosis not present

## 2023-08-23 DIAGNOSIS — Q828 Other specified congenital malformations of skin: Secondary | ICD-10-CM | POA: Diagnosis not present

## 2023-08-24 DIAGNOSIS — R809 Proteinuria, unspecified: Secondary | ICD-10-CM | POA: Diagnosis not present

## 2023-08-24 DIAGNOSIS — I10 Essential (primary) hypertension: Secondary | ICD-10-CM | POA: Diagnosis not present

## 2023-08-24 DIAGNOSIS — E211 Secondary hyperparathyroidism, not elsewhere classified: Secondary | ICD-10-CM | POA: Diagnosis not present

## 2023-08-24 DIAGNOSIS — E1149 Type 2 diabetes mellitus with other diabetic neurological complication: Secondary | ICD-10-CM | POA: Diagnosis not present

## 2023-08-24 DIAGNOSIS — D631 Anemia in chronic kidney disease: Secondary | ICD-10-CM | POA: Diagnosis not present

## 2023-08-24 DIAGNOSIS — N189 Chronic kidney disease, unspecified: Secondary | ICD-10-CM | POA: Diagnosis not present

## 2023-08-24 DIAGNOSIS — E785 Hyperlipidemia, unspecified: Secondary | ICD-10-CM | POA: Diagnosis not present

## 2023-08-24 DIAGNOSIS — E039 Hypothyroidism, unspecified: Secondary | ICD-10-CM | POA: Diagnosis not present

## 2023-08-24 DIAGNOSIS — E559 Vitamin D deficiency, unspecified: Secondary | ICD-10-CM | POA: Diagnosis not present

## 2023-08-25 LAB — CMP14+EGFR
ALT: 17 [IU]/L (ref 0–32)
AST: 22 [IU]/L (ref 0–40)
Albumin: 4.4 g/dL (ref 3.7–4.7)
Alkaline Phosphatase: 74 [IU]/L (ref 44–121)
BUN/Creatinine Ratio: 13 (ref 12–28)
BUN: 23 mg/dL (ref 8–27)
Bilirubin Total: 0.4 mg/dL (ref 0.0–1.2)
CO2: 20 mmol/L (ref 20–29)
Calcium: 9.7 mg/dL (ref 8.7–10.3)
Chloride: 106 mmol/L (ref 96–106)
Creatinine, Ser: 1.83 mg/dL — ABNORMAL HIGH (ref 0.57–1.00)
Globulin, Total: 4 g/dL (ref 1.5–4.5)
Glucose: 97 mg/dL (ref 70–99)
Potassium: 4 mmol/L (ref 3.5–5.2)
Sodium: 141 mmol/L (ref 134–144)
Total Protein: 8.4 g/dL (ref 6.0–8.5)
eGFR: 27 mL/min/{1.73_m2} — ABNORMAL LOW (ref >59)

## 2023-08-25 LAB — HEMOGLOBIN A1C
Est. average glucose Bld gHb Est-mCnc: 134 mg/dL
Hgb A1c MFr Bld: 6.3 % — ABNORMAL HIGH (ref 4.8–5.6)

## 2023-08-25 LAB — LIPID PANEL
Chol/HDL Ratio: 2.3 {ratio} (ref 0.0–4.4)
Cholesterol, Total: 164 mg/dL (ref 100–199)
HDL: 72 mg/dL (ref >39)
LDL Chol Calc (NIH): 79 mg/dL (ref 0–99)
Triglycerides: 67 mg/dL (ref 0–149)
VLDL Cholesterol Cal: 13 mg/dL (ref 5–40)

## 2023-08-25 LAB — TSH: TSH: 1.62 u[IU]/mL (ref 0.450–4.500)

## 2023-08-25 LAB — VITAMIN D 25 HYDROXY (VIT D DEFICIENCY, FRACTURES): Vit D, 25-Hydroxy: 35.4 ng/mL (ref 30.0–100.0)

## 2023-08-26 NOTE — Progress Notes (Signed)
  Subjective:  Patient ID: Renee Collins, female    DOB: May 28, 1941,  MRN: [id number]  Renee Collins presents to clinic today for at risk foot care. Pt has h/o NIDDM with chronic kidney disease and callus(es) left lower extremity, porokeratotic lesion(s) left lower extremity, and painful mycotic nails. Painful toenails interfere with ambulation. Aggravating factors include wearing enclosed shoe gear. Pain is relieved with periodic professional debridement. Painful callus(es) and porokeratotic lesion(s) are aggravated when weightbearing with and without shoegear. Pain is relieved with periodic professional debridement.  Chief Complaint  Patient presents with   Nail Problem    Pt is here for Renee Collins last A1C was 6.6 PCP is Dr Lodema Hong and LOV was in December.   New problem(s): None.   PCP is Kerri Perches, MD.  No Known Allergies  Review of Systems: Negative except as noted in the HPI.  Objective: No changes noted in today's physical examination. There were no vitals filed for this visit. Renee Collins is a pleasant 83 y.o. female WD, WN in NAD. AAO x 3.  Vascular Examination: Capillary refill time immediate b/l. Vascular status intact b/l with palpable pedal pulses. Pedal hair present b/l. No pain with calf compression b/l. Skin temperature gradient WNL b/l. No cyanosis or clubbing b/l. No ischemia or gangrene noted b/l.   Neurological Examination: Sensation grossly intact b/l with 10 gram monofilament. Vibratory sensation intact b/l.   Dermatological Examination: Pedal skin with normal turgor, texture and tone b/l.  No open wounds. No interdigital macerations.   Toenails 1-5 b/l thick, discolored, elongated with subungual debris and pain on dorsal palpation.   Hyperkeratotic lesion(s) submet head 5 left foot.  No erythema, no edema, no drainage, no fluctuance. Porokeratotic lesion(s) lateral aspect left foot. No erythema, no edema, no drainage, no fluctuance.  Musculoskeletal  Examination: Muscle strength 5/5 to all lower extremity muscle groups bilaterally. No pain, crepitus or joint limitation noted with ROM bilateral LE. No gross bony deformities bilaterally.  Radiographs: None  Assessment/Plan: 1. Pain due to onychomycosis of toenails of both feet   2. Callus   3. Porokeratosis   4. 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 -Continue supportive shoe gear daily. -Mycotic toenails 1-5 bilaterally were debrided in length and girth with sterile nail nippers and dremel without incident. -Callus(es) submet head 5 left foot pared utilizing sterile scalpel blade without complication or incident. Total number debrided =1. -Porokeratotic lesion(s) lateral aspect left foot pared and enucleated with sterile currette without incident. Total number of lesions debrided=1. -Patient/POA to call should there be question/concern in the interim.   Return in about 4 months (around 12/21/2023).  Renee Collins, Collins      [facility] LOCATION: [address] [place] [address]                   Office [phone]   [facility] LOCATION: [address] [place], [place] [address] Office [phone]

## 2023-08-29 DIAGNOSIS — E1129 Type 2 diabetes mellitus with other diabetic kidney complication: Secondary | ICD-10-CM | POA: Diagnosis not present

## 2023-08-29 DIAGNOSIS — D472 Monoclonal gammopathy: Secondary | ICD-10-CM | POA: Diagnosis not present

## 2023-08-29 DIAGNOSIS — R809 Proteinuria, unspecified: Secondary | ICD-10-CM | POA: Diagnosis not present

## 2023-08-29 DIAGNOSIS — E211 Secondary hyperparathyroidism, not elsewhere classified: Secondary | ICD-10-CM | POA: Diagnosis not present

## 2023-08-31 ENCOUNTER — Encounter: Payer: No Typology Code available for payment source | Admitting: Family Medicine

## 2023-09-06 ENCOUNTER — Ambulatory Visit (INDEPENDENT_AMBULATORY_CARE_PROVIDER_SITE_OTHER)

## 2023-09-06 VITALS — Ht 65.0 in | Wt 176.0 lb

## 2023-09-06 DIAGNOSIS — Z780 Asymptomatic menopausal state: Secondary | ICD-10-CM | POA: Diagnosis not present

## 2023-09-06 DIAGNOSIS — Z0000 Encounter for general adult medical examination without abnormal findings: Secondary | ICD-10-CM

## 2023-09-06 NOTE — Patient Instructions (Signed)
 Renee Collins , Thank you for taking time to come for your Medicare Wellness Visit. I appreciate your ongoing commitment to your health goals. Please review the following plan we discussed and let me know if I can assist you in the future.   Referrals/Orders/Follow-Ups/Clinician Recommendations:  Next Medicare Annual Wellness Visit:   September 10, 2024 at 8:00 am video visit  An order for a bone density scan has been placed for you today. Please call the number below to schedule your appt.   [facility] at [facility] [address] [place], [place] [address] [phone] Make sure to wear two-piece clothing.  No lotions powders or deodorants the day of the appointment Make sure to bring picture ID and insurance card.  Bring list of medications you are currently taking including any supplements.   This is a list of the screening recommended for you and due dates:  Health Maintenance  Topic Date Due   DEXA scan (bone density measurement)  06/14/2020   COVID-19 Vaccine (7 - 2024-25 season) 06/13/2023   Eye exam for diabetics  12/08/2023   Hemoglobin A1C  02/21/2024   Complete foot exam   04/17/2024   Yearly kidney health urinalysis for diabetes  06/01/2024   Mammogram  07/03/2024   Yearly kidney function blood test for diabetes  08/23/2024   Medicare Annual Wellness Visit  09/05/2024   Colon Cancer Screening  06/13/2028   DTaP/Tdap/Td vaccine (3 - Td or Tdap) 08/09/2031   Pneumonia Vaccine  Completed   Flu Shot  Completed   Zoster (Shingles) Vaccine  Completed   HPV Vaccine  Aged Out    Advanced directives: (In Chart) A copy of your advanced directives are scanned into your chart should your provider ever need it.  Next Medicare Annual Wellness Visit scheduled for next year: yes  Understanding Your Risk for Falls Millions of people have serious injuries from falls each year. It is important to understand your risk of falling. Talk with your health care provider  about your risk and what you can do to lower it. If you do have a serious fall, make sure to tell your provider. Falling once raises your risk of falling again. How can falls affect me? Serious injuries from falls are common. These include: Broken bones, such as hip fractures. Head injuries, such as traumatic brain injuries (TBI) or concussions. A fear of falling can cause you to avoid activities and stay at home. This can make your muscles weaker and raise your risk for a fall. What can increase my risk? There are a number of risk factors that increase your risk for falling. The more risk factors you have, the higher your risk of falling. Serious injuries from a fall happen most often to people who are older than 85 years old. Teenagers and young adults ages 27-29 are also at higher risk. Common risk factors include: Weakness in the lower body. Being generally weak or confused due to long-term (chronic) illness. Dizziness or balance problems. Poor vision. Medicines that cause dizziness or drowsiness. These may include: Medicines for your blood pressure, heart, anxiety, insomnia, or swelling (edema). Pain medicines. Muscle relaxants. Other risk factors include: Drinking alcohol. Having had a fall in the past. Having foot pain or wearing improper footwear. Working at a dangerous job. Having any of the following in your home: Tripping hazards, such as floor clutter or loose rugs. Poor lighting. Pets. Having dementia or memory loss. What actions can I take to lower  my risk of falling?     Physical activity Stay physically fit. Do strength and balance exercises. Consider taking a regular class to build strength and balance. Yoga and tai chi are good options. Vision Have your eyes checked every year and your prescription for glasses or contacts updated as needed. Shoes and walking aids Wear non-skid shoes. Wear shoes that have rubber soles and low heels. Do not wear high heels. Do  not walk around the house in socks or slippers. Use a cane or walker as told by your provider. Home safety Attach secure railings on both sides of your stairs. Install grab bars for your bathtub, shower, and toilet. Use a non-skid mat in your bathtub or shower. Attach bath mats securely with double-sided, non-slip rug tape. Use good lighting in all rooms. Keep a flashlight near your bed. Make sure there is a clear path from your bed to the bathroom. Use night-lights. Do not use throw rugs. Make sure all carpeting is taped or tacked down securely. Remove all clutter from walkways and stairways, including extension cords. Repair uneven or broken steps and floors. Avoid walking on icy or slippery surfaces. Walk on the grass instead of on icy or slick sidewalks. Use ice melter to get rid of ice on walkways in the winter. Use a cordless phone. Questions to ask your health care provider Can you help me check my risk for a fall? Do any of my medicines make me more likely to fall? Should I take a vitamin D supplement? What exercises can I do to improve my strength and balance? Should I make an appointment to have my vision checked? Do I need a bone density test to check for weak bones (osteoporosis)? Would it help to use a cane or a walker? Where to find more information Centers for Disease Control and Prevention, STEAD[employer]: TonerPromos.no Community-Based Fall Prevention Programs: TonerPromos.no [employer] on Aging: BaseRingTones.pl Contact a health care provider if: You fall at home. You are afraid of falling at home. You feel weak, drowsy, or dizzy. This information is not intended to replace advice given to you by your health care provider. Make sure you discuss any questions you have with your health care provider. Document Revised: 02/15/2022 Document Reviewed: 02/15/2022 Elsevier Patient Education  2024 [employer].

## 2023-09-06 NOTE — Progress Notes (Signed)
 Because this visit was a virtual/telehealth visit,  certain criteria was not obtained, such a blood pressure, CBG if applicable, and timed get up and go. Any medications not marked as "taking" were not mentioned during the medication reconciliation part of the visit. Any vitals not documented were not able to be obtained due to this being a telehealth visit or patient was unable to self-report a recent blood pressure reading due to a lack of equipment at home via telehealth. Vitals that have been documented are verbally provided by the patient.   Subjective:   Renee Collins is a 83 y.o. who presents for a Medicare Wellness preventive visit.  Visit Complete: Virtual I connected with  [name] on 09/06/23 by a audio enabled telemedicine application and verified that I am speaking with the correct person using two identifiers.  Patient Location: Home  Provider Location: Home Office  I discussed the limitations of evaluation and management by telemedicine. The patient expressed understanding and agreed to proceed.  Vital Signs: Because this visit was a virtual/telehealth visit, some criteria may be missing or patient reported. Any vitals not documented were not able to be obtained and vitals that have been documented are patient reported.  VideoError- Librarian, academic were attempted between this provider and patient, however failed, due to patient having technical difficulties OR patient did not have access to video capability.  We continued and completed visit with audio only.   AWV Questionnaire: No: Patient Medicare AWV questionnaire was not completed prior to this visit.  Cardiac Risk Factors include: advanced age (>64men, >50 women);diabetes mellitus;dyslipidemia;hypertension     Objective:    Today's Vitals   09/06/23 0814  Weight: 176 lb (79.8 kg)  Height: 5\' 5"  (1.651 m)   Body mass index is 29.29 kg/m.     06/16/2023    8:41 AM 06/06/2023     2:43 PM 03/07/2023    9:27 AM 02/21/2023    7:00 AM 02/18/2023    9:00 AM 10/26/2022    9:03 AM 10/07/2021   11:28 AM  Advanced Directives  Does Patient Have a Medical Advance Directive? No No No No No Yes No  Type of Careers adviser;Living will   Does patient want to make changes to medical advance directive?      No - Patient declined   Copy of Healthcare Power of Attorney in Chart?      No - copy requested   Would patient like information on creating a medical advance directive? No - Patient declined No - Patient declined No - Patient declined No - Patient declined No - Patient declined  No - Patient declined    Current Medications (verified) Outpatient Encounter Medications as of 09/06/2023  Medication Sig   acetaminophen (TYLENOL) 650 MG CR tablet Take 650 mg by mouth every 8 (eight) hours as needed for pain.    amLODipine (NORVASC) 10 MG tablet Take 1 tablet (10 mg total) by mouth daily.   aspirin 81 MG EC tablet Take 81 mg by mouth daily.   benazepril (LOTENSIN) 5 MG tablet Take 1 tablet (5 mg total) by mouth daily.   buPROPion (WELLBUTRIN XL) 150 MG 24 hr tablet Take 1 tablet (150 mg total) by mouth daily.   calcitRIOL (ROCALTROL) 0.25 MCG capsule Take 0.25 mcg by mouth every Monday, Wednesday, and Friday.   Calcium-Magnesium-Vitamin D (CALCIUM 1200+D3 PO) Take 1 tablet by mouth 2 (two) times daily.  diphenhydrAMINE (BENADRYL) 25 mg capsule Take 25 mg by mouth at bedtime as needed for allergies or sleep.   hydrochlorothiazide (HYDRODIURIL) 25 MG tablet Take 1 tablet (25 mg total) by mouth daily.   levothyroxine (SYNTHROID) 88 MCG tablet TAKE 1 TABLET BY MOUTH ONCE DAILY. 1/2 HOUR BEFORE FIRST MEAL OF THE DAY. TAKE WITH WATER.   Multiple Vitamin (MULTIVITAMIN WITH MINERALS) TABS tablet Take 1 tablet by mouth daily.   omeprazole (PRILOSEC) 20 MG capsule Take 1 capsule (20 mg total) by mouth daily as needed (for acid reflux.).   pravastatin (PRAVACHOL)  20 MG tablet Take 1 tablet (20 mg total) by mouth every evening.   No facility-administered encounter medications on file as of 09/06/2023.    Allergies (verified) Patient has no known allergies.   History: Past Medical History:  Diagnosis Date   Anemia    Chronic back pain    Chronic kidney disease    Depression    Diabetes mellitus without complication (HCC)    GERD (gastroesophageal reflux disease)    Heart murmur    Hyperlipidemia    Hypertension    Hypothyroidism    Pre-diabetes    Past Surgical History:  Procedure Laterality Date   ANTERIOR CERVICAL DECOMP/DISCECTOMY FUSION N/A 09/15/2021   Procedure: Anterior Cervical Decompression Discectomy Fusion Cervical three-four, Cervical four-five;  Surgeon: Bedelia Person, MD;  Location: [facility] OR;  Service: Neurosurgery;  Laterality: N/A;   BONE MARROW ASPIRATION Left 05/16/15   BONE MARROW BIOPSY Left 05/16/15   CATARACT EXTRACTION W/PHACO Right 02/21/2023   Procedure: CATARACT EXTRACTION PHACO AND INTRAOCULAR LENS PLACEMENT (IOC);  Surgeon: [name], MD;  Location: [facility] ORS;  Service: Ophthalmology;  Laterality: Right;  CDE: 7.42   CATARACT EXTRACTION W/PHACO Left 03/07/2023   Procedure: CATARACT EXTRACTION PHACO AND INTRAOCULAR LENS PLACEMENT (IOC);  Surgeon: [name], MD;  Location: [facility] ORS;  Service: Ophthalmology;  Laterality: Left;  CDE: 8.10   COLONOSCOPY  03/30/2012   Procedure: COLONOSCOPY;  Surgeon: Malissa Hippo, MD;  Location: [facility];  Service: Endoscopy;  Laterality: N/A;  730   COLONOSCOPY N/A 06/30/2017   Procedure: COLONOSCOPY;  Surgeon: Malissa Hippo, MD;  Location: [facility];  Service: Endoscopy;  Laterality: N/A;  2:00   COLONOSCOPY WITH PROPOFOL N/A 06/14/2023   Procedure: COLONOSCOPY WITH PROPOFOL;  Surgeon: Franky Macho, MD;  Location: [facility];  Service: Endoscopy;  Laterality: N/A;   LAMINECTOMY  1980's   POLYPECTOMY  06/30/2017   Procedure: POLYPECTOMY;  Surgeon: Malissa Hippo, MD;  Location: [facility];  Service: Endoscopy;;  colon   POLYPECTOMY  06/14/2023   Procedure: POLYPECTOMY;  Surgeon: Franky Macho, MD;  Location: [facility];  Service: Endoscopy;;   SPINE SURGERY  approx 1983   dr Jeral Fruit   TOTAL ABDOMINAL HYSTERECTOMY  approx 1993   fibroids    Family History  Problem Relation Age of Onset   Heart attack Mother 62   Heart disease Mother    Cancer Mother        type unknown   Diabetes Mother    Kidney failure Father    Kidney disease Father    Myasthenia gravis Brother    Diabetes Sister    Breast cancer Sister 56   Heart disease Brother    Diabetes Sister    Sickle cell anemia Sister    Cancer Sister        unknown type   Social History   Socioeconomic  History   Marital status: Divorced    Spouse name: Not on file   Number of children: 3   Years of education: Not on file   Highest education level: Not on file  Occupational History   Occupation: LPN  Tobacco Use   Smoking status: Never   Smokeless tobacco: Never  Vaping Use   Vaping status: Never Used  Substance and Sexual Activity   Alcohol use: No   Drug use: No   Sexual activity: Not Currently  Other Topics Concern   Not on file  Social History Narrative   Pt has 2 living adult children, 1 desease at age 15 secondary congential heart disease.   7 grandchildren.   Social Drivers of Corporate investment banker Strain: Low Risk  (09/06/2023)   Overall Financial Resource Strain (CARDIA)    Difficulty of Paying Living Expenses: Not hard at all  Food Insecurity: No Food Insecurity (09/06/2023)   Hunger Vital Sign    Worried About Running Out of Food in the Last Year: Never true    Ran Out of Food in the Last Year: Never true  Transportation Needs: No Transportation Needs (09/06/2023)   PRAPARE - Administrator, Civil Service (Medical): No    Lack of Transportation (Non-Medical): No  Physical Activity: Sufficiently Active (09/06/2023)   Exercise  Vital Sign    Days of Exercise per Week: 7 days    Minutes of Exercise per Session: 30 min  Stress: No Stress Concern Present (09/06/2023)   [employer] of Occupational Health - Occupational Stress Questionnaire    Feeling of Stress : Not at all  Social Connections: Moderately Integrated (09/06/2023)   Social Connection and Isolation Panel [NHANES]    Frequency of Communication with Friends and Family: More than three times a week    Frequency of Social Gatherings with Friends and Family: More than three times a week    Attends Religious Services: More than 4 times per year    Active Member of [employer] or Organizations: Yes    Attends Engineer, structural: More than 4 times per year    Marital Status: Divorced    Tobacco Counseling Counseling given: Yes    Clinical Intake:  Pre-visit preparation completed: Yes  Pain : No/denies pain     BMI - recorded: 29.29 Nutritional Risks: None Diabetes: Yes CBG done?: No Did pt. bring in CBG monitor from home?: No  How often do you need to have someone help you when you read instructions, pamphlets, or other written materials from your doctor or pharmacy?: 1 - Never  Interpreter Needed?: No  Information entered by :: Maryjean Ka CMA   Activities of Daily Living     09/06/2023    8:25 AM 06/06/2023    2:45 PM  In your present state of health, do you have any difficulty performing the following activities:  Hearing? 0   Vision? 0   Difficulty concentrating or making decisions? 0   Walking or climbing stairs? 0   Dressing or bathing? 0   Doing errands, shopping? 0 0  Preparing Food and eating ? N   Using the Toilet? N   In the past six months, have you accidently leaked urine? N   Do you have problems with loss of bowel control? N   Managing your Medications? N   Managing your Finances? N   Housekeeping or managing your Housekeeping? N     Patient Care Team: Kerri Perches,  MD as PCP - General (Family  Medicine) Wendall Stade, MD as PCP - Cardiology (Cardiology) Penland, Novella Olive, MD (Inactive) as Consulting Physician (Hematology and Oncology) Jethro Bolus, MD as Consulting Physician (Ophthalmology) Kerri Perches, MD Karie Soda, MD as Consulting Physician (General Surgery) Ahmed, Juanetta Beets, MD as Consulting Physician (Gastroenterology)  Indicate any recent Medical Services you may have received from other than [facility] providers in the past year (date may be approximate).     Assessment:   This is a routine wellness examination for [place].  Hearing/Vision screen Hearing Screening - Comments:: Patient denies any hearing difficulties.   Vision Screening - Comments:: Patient wears reading glasses only. Up to date with yearly exams.  Patient sees Dr. Daisy Lazar w/ [facility] office.     Goals Addressed             This Visit's Progress    Patient Stated       I'd like to travel more.        Depression Screen     09/06/2023    8:26 AM 06/02/2023   11:02 AM 04/14/2023   10:53 AM 04/14/2023   10:10 AM 12/10/2022    9:47 AM 07/13/2022   11:18 AM 02/23/2022    9:25 AM  PHQ 2/9 Scores  PHQ - 2 Score 0 0 1 0 2 0 0  PHQ- 9 Score 0  5 2 4  0     Fall Risk     09/06/2023    8:25 AM 06/02/2023   11:02 AM 04/14/2023   10:10 AM 12/10/2022    9:47 AM 07/13/2022   11:18 AM  Fall Risk   Falls in the past year? 0 0 0 0 0  Number falls in past yr: 0 0 0 0 0  Injury with Fall? 0 0 0 0 0  Risk for fall due to : No Fall Risks No Fall Risks  No Fall Risks No Fall Risks  Follow up Falls prevention discussed;Falls evaluation completed Falls evaluation completed  Falls evaluation completed Falls evaluation completed    MEDICARE RISK AT HOME:  Medicare Risk at Home Any stairs in or around the home?: Yes If so, are there any without handrails?: No Home free of loose throw rugs in walkways, pet beds, electrical cords, etc?: Yes Adequate lighting in your home to  reduce risk of falls?: Yes Life alert?: No Use of a cane, walker or w/c?: No Grab bars in the bathroom?: Yes Shower chair or bench in shower?: No Elevated toilet seat or a handicapped toilet?: No  TIMED UP AND GO:  Was the test performed?  No  Cognitive Function: 6CIT completed        09/06/2023    8:21 AM 04/20/2022    9:20 AM 04/15/2021    9:27 AM 04/01/2020   10:07 AM 03/30/2019   10:05 AM  6CIT Screen  What Year? 0 points 0 points 0 points 0 points 0 points  What month? 0 points 0 points 0 points 0 points 0 points  What time? 0 points 0 points 0 points 0 points 0 points  Count back from 20 0 points 0 points 0 points 0 points 0 points  Months in reverse 0 points 0 points 2 points 0 points 0 points  Repeat phrase 0 points 0 points 0 points 0 points 0 points  Total Score 0 points 0 points 2 points 0 points 0 points    Immunizations Immunization History  Administered Date(s) Administered   Fluad Quad(high Dose 65+) 02/19/2019, 03/25/2020, 03/25/2021, 03/18/2022   Fluad Trivalent(High Dose 65+) 04/14/2023   Influenza Split 04/28/2011, 04/12/2012   Influenza,inj,Quad PF,6+ Mos 04/09/2013, 05/02/2014, 03/11/2015, 05/28/2016, 03/22/2017, 03/27/2018   Moderna Covid-19 Vaccine Bivalent Booster 67yrs & up 04/07/2022, 04/18/2023   Moderna SARS-COV2 Booster Vaccination 01/03/2021, 05/09/2021   Moderna Sars-Covid-2 Vaccination 08/04/2019, 09/04/2019, 04/25/2020, 11/26/2021   Pneumococcal Conjugate-13 09/30/2014   Pneumococcal Polysaccharide-23 04/28/2011   Td 08/08/2021   Tdap 04/28/2011   Zoster Recombinant(Shingrix) 02/28/2019, 02/28/2019, 06/13/2019   Zoster, Live 08/26/2011    Screening Tests Health Maintenance  Topic Date Due   DEXA SCAN  06/14/2020   COVID-19 Vaccine (7 - 2024-25 season) 06/13/2023   OPHTHALMOLOGY EXAM  12/08/2023   HEMOGLOBIN A1C  02/21/2024   FOOT EXAM  04/17/2024   Diabetic kidney evaluation - Urine ACR  06/01/2024   MAMMOGRAM  07/03/2024    Diabetic kidney evaluation - eGFR measurement  08/23/2024   Medicare Annual Wellness (AWV)  09/05/2024   Colonoscopy  06/13/2028   DTaP/Tdap/Td (3 - Td or Tdap) 08/09/2031   Pneumonia Vaccine 20+ Years old  Completed   INFLUENZA VACCINE  Completed   Zoster Vaccines- Shingrix  Completed   HPV VACCINES  Aged Out    Health Maintenance  Health Maintenance Due  Topic Date Due   DEXA SCAN  06/14/2020   COVID-19 Vaccine (7 - 2024-25 season) 06/13/2023   Health Maintenance Items Addressed: DEXA ordered  Additional Screening:  Vision Screening: Recommended annual ophthalmology exams for early detection of glaucoma and other disorders of the eye.  Dental Screening: Recommended annual dental exams for proper oral hygiene  Community Resource Referral / Chronic Care Management: CRR required this visit?  No   CCM required this visit?  No     Plan:     I have personally reviewed and noted the following in the patient's chart:   Medical and social history Use of alcohol, tobacco or illicit drugs  Current medications and supplements including opioid prescriptions. Patient is not currently taking opioid prescriptions. Functional ability and status Nutritional status Physical activity Advanced directives List of other physicians Hospitalizations, surgeries, and ER visits in previous 12 months Vitals Screenings to include cognitive, depression, and falls Referrals and appointments  In addition, I have reviewed and discussed with patient certain preventive protocols, quality metrics, and best practice recommendations. A written personalized care plan for preventive services as well as general preventive health recommendations were provided to patient.     Jordan Hawks Lucyann Romano, CMA   09/06/2023   After Visit Summary: (MyChart) Due to this being a telephonic visit, the after visit summary with patients personalized plan was offered to patient via MyChart   Notes: Please refer to  Routing Comments.

## 2023-09-07 ENCOUNTER — Inpatient Hospital Stay: Payer: No Typology Code available for payment source | Attending: Physician Assistant

## 2023-09-07 DIAGNOSIS — Z803 Family history of malignant neoplasm of breast: Secondary | ICD-10-CM | POA: Insufficient documentation

## 2023-09-07 DIAGNOSIS — D500 Iron deficiency anemia secondary to blood loss (chronic): Secondary | ICD-10-CM | POA: Diagnosis not present

## 2023-09-07 DIAGNOSIS — D472 Monoclonal gammopathy: Secondary | ICD-10-CM | POA: Diagnosis not present

## 2023-09-07 DIAGNOSIS — D122 Benign neoplasm of ascending colon: Secondary | ICD-10-CM | POA: Diagnosis not present

## 2023-09-07 DIAGNOSIS — N184 Chronic kidney disease, stage 4 (severe): Secondary | ICD-10-CM | POA: Diagnosis not present

## 2023-09-07 DIAGNOSIS — D631 Anemia in chronic kidney disease: Secondary | ICD-10-CM | POA: Insufficient documentation

## 2023-09-07 LAB — COMPREHENSIVE METABOLIC PANEL
ALT: 19 U/L (ref 0–44)
AST: 22 U/L (ref 15–41)
Albumin: 3.7 g/dL (ref 3.5–5.0)
Alkaline Phosphatase: 57 U/L (ref 38–126)
Anion gap: 10 (ref 5–15)
BUN: 29 mg/dL — ABNORMAL HIGH (ref 8–23)
CO2: 22 mmol/L (ref 22–32)
Calcium: 9.6 mg/dL (ref 8.9–10.3)
Chloride: 106 mmol/L (ref 98–111)
Creatinine, Ser: 1.91 mg/dL — ABNORMAL HIGH (ref 0.44–1.00)
GFR, Estimated: 26 mL/min — ABNORMAL LOW (ref >60)
Glucose, Bld: 115 mg/dL — ABNORMAL HIGH (ref 70–99)
Potassium: 3.6 mmol/L (ref 3.5–5.1)
Sodium: 138 mmol/L (ref 135–145)
Total Bilirubin: 0.4 mg/dL (ref 0.0–1.2)
Total Protein: 8.4 g/dL — ABNORMAL HIGH (ref 6.5–8.1)

## 2023-09-07 LAB — CBC WITH DIFFERENTIAL/PLATELET
Abs Immature Granulocytes: 0.01 10*3/uL (ref 0.00–0.07)
Basophils Absolute: 0 10*3/uL (ref 0.0–0.1)
Basophils Relative: 1 %
Eosinophils Absolute: 0.3 10*3/uL (ref 0.0–0.5)
Eosinophils Relative: 6 %
HCT: 33 % — ABNORMAL LOW (ref 36.0–46.0)
Hemoglobin: 10.2 g/dL — ABNORMAL LOW (ref 12.0–15.0)
Immature Granulocytes: 0 %
Lymphocytes Relative: 43 %
Lymphs Abs: 2.1 10*3/uL (ref 0.7–4.0)
MCH: 26.8 pg (ref 26.0–34.0)
MCHC: 30.9 g/dL (ref 30.0–36.0)
MCV: 86.8 fL (ref 80.0–100.0)
Monocytes Absolute: 0.4 10*3/uL (ref 0.1–1.0)
Monocytes Relative: 8 %
Neutro Abs: 2 10*3/uL (ref 1.7–7.7)
Neutrophils Relative %: 42 %
Platelets: 229 10*3/uL (ref 150–400)
RBC: 3.8 MIL/uL — ABNORMAL LOW (ref 3.87–5.11)
RDW: 13.9 % (ref 11.5–15.5)
WBC: 4.7 10*3/uL (ref 4.0–10.5)
nRBC: 0 % (ref 0.0–0.2)

## 2023-09-07 LAB — IRON AND TIBC
Iron: 63 ug/dL (ref 28–170)
Saturation Ratios: 21 % (ref 10.4–31.8)
TIBC: 305 ug/dL (ref 250–450)
UIBC: 242 ug/dL

## 2023-09-07 LAB — LACTATE DEHYDROGENASE: LDH: 118 U/L (ref 98–192)

## 2023-09-07 LAB — FERRITIN: Ferritin: 74 ng/mL (ref 11–307)

## 2023-09-08 LAB — PROTEIN ELECTROPHORESIS, SERUM
A/G Ratio: 0.9 (ref 0.7–1.7)
Albumin ELP: 3.7 g/dL (ref 2.9–4.4)
Alpha-1-Globulin: 0.3 g/dL (ref 0.0–0.4)
Alpha-2-Globulin: 0.9 g/dL (ref 0.4–1.0)
Beta Globulin: 1 g/dL (ref 0.7–1.3)
Gamma Globulin: 2.2 g/dL — ABNORMAL HIGH (ref 0.4–1.8)
Globulin, Total: 4.3 g/dL — ABNORMAL HIGH (ref 2.2–3.9)
M-Spike, %: 1.8 g/dL — ABNORMAL HIGH
Total Protein ELP: 8 g/dL (ref 6.0–8.5)

## 2023-09-08 LAB — KAPPA/LAMBDA LIGHT CHAINS
Kappa free light chain: 194.4 mg/L — ABNORMAL HIGH (ref 3.3–19.4)
Kappa, lambda light chain ratio: 9.87 — ABNORMAL HIGH (ref 0.26–1.65)
Lambda free light chains: 19.7 mg/L (ref 5.7–26.3)

## 2023-09-14 ENCOUNTER — Inpatient Hospital Stay (HOSPITAL_BASED_OUTPATIENT_CLINIC_OR_DEPARTMENT_OTHER): Payer: No Typology Code available for payment source | Admitting: Oncology

## 2023-09-14 ENCOUNTER — Encounter: Payer: Self-pay | Admitting: Oncology

## 2023-09-14 ENCOUNTER — Ambulatory Visit (INDEPENDENT_AMBULATORY_CARE_PROVIDER_SITE_OTHER): Payer: No Typology Code available for payment source | Admitting: Gastroenterology

## 2023-09-14 VITALS — BP 143/79 | HR 88 | Temp 96.1°F | Resp 18 | Wt 178.6 lb

## 2023-09-14 DIAGNOSIS — D500 Iron deficiency anemia secondary to blood loss (chronic): Secondary | ICD-10-CM | POA: Diagnosis not present

## 2023-09-14 DIAGNOSIS — D472 Monoclonal gammopathy: Secondary | ICD-10-CM

## 2023-09-14 DIAGNOSIS — N1832 Chronic kidney disease, stage 3b: Secondary | ICD-10-CM

## 2023-09-14 DIAGNOSIS — D122 Benign neoplasm of ascending colon: Secondary | ICD-10-CM

## 2023-09-14 DIAGNOSIS — D631 Anemia in chronic kidney disease: Secondary | ICD-10-CM

## 2023-09-14 NOTE — Progress Notes (Signed)
 [facility] [address], [place] [address]   CLINIC:  Medical Oncology/Hematology  PCP:  Kerri Perches, MD [address], [address] [place] [address] [phone]   REASON FOR VISIT:  Follow-up for MGUS and normocytic anemia  CURRENT THERAPY: Intermittent Feraheme (last on 02/15/2020)  INTERVAL HISTORY:   [name] 83 y.o. female returns for routine follow-up of MGUS and anemia.  She was last seen in clinic on 06/16/2023 by Rojelio Brenner, PA.  Patient was seen by gastroenterology on 07/22/2023 for weight loss, anemia and protruding anal lesion.  Most recent colonoscopy (06/13/24) found a protruding anal lesion and grade 4 right hemorrhoid and possible adenomatous tissue.  It was recommended by colorectal surgery hemorrhoidal ligation and hemorrhoidectomy.  Patient has deferred at this time.  Reports overall she is doing well.  Denies any bleeding episodes such as epistaxis, hematemesis, hematochezia or melena.  Reports she is having some insomnia which is new for her.  She is always taking Benadryl but states it is not working.  Some fatigue.  She takes a multivitamin with iron about any issue.  Has any constipation.  She denies any chest pain, dyspnea on exertion, or syncope. No new neurologic changes or vasomotor symptoms - she denies any neuropathy, vision changes, tinnitus, strokelike symptoms, dizziness, or headache. She denies any new bony pain. She has 75% energy and 100% appetite. She endorses that she is maintaining a stable weight.  ASSESSMENT & PLAN:  1.  IgG kappa MGUS: - Bone marrow biopsy on 05/16/2015 shows limited bone marrow sample with no increase in plasma cells in the very limited material present.  Cytogenetics-46, XX.  FISH panel was normal. - Immunofixation shows monoclonal protein with IgG kappa specificity - Bone survey on 02/22/2017 showed ill-defined calcified lesion in the distal right femur.  MRI of the right femur on  03/02/2017 showed no osseous lesion. - PET scan on 02/25/2020 was negative for any myeloma lesions or other malignancies. - Most recent skeletal survey (04/20/2023): No discrete pathologic osseous lesions in the axial or appendicular skeleton - No B symptoms or new bony pain  2.  Normocytic anemia: -This is from chronic kidney disease and relative iron deficiency.  She last received IV Feraheme on 02/15/2020 - Denies any bleeding per rectum or melena   3.  CKD stage IIIb/IV: - Per note by Dr. Wolfgang Phoenix, CKD is secondary to diabetes mellitus - Creatinine is usually between 1.5 and 1.8 - Renal ultrasound on 01/24/2020 shows small right renal cyst with no additional findings. -Continue follow-up with Dr. Wolfgang Phoenix.  4.  ABNORMAL COLONOSCOPY - Colonoscopy from 06/14/2023 with protruding soft large lesion of perianal region - She was referred to colorectal surgery who recommended a hemorrhoidectomy or hemorrhoid ligation.  She has deferred at this time.   Plan: 1. MGUS (monoclonal gammopathy of unknown significance) (Primary) - Most recent labs (09/07/23):  M spike proved and is 1.8. Elevated kappa 194.4, normal lambda 19.8, elevated ratio 9.87.  LDH normal. - No CRAB features at this time (09/07/23) - creatinine 1.91 (baseline CKD), calcium 9.6, Hgb 10.2/MCV 86.8 - Repeat MGUS/myeloma panel in 6 months.  - 24-hour urine/UPEP 06/21/2023 showed Bence-Jones protein positive kappa type.  Immunofixation shows IgG monoclonal protein with kappa light chain specificity.  M spike 3.3. - Next skeletal survey due in 1 year (October 2025) -Will continue to monitor with labs every 3 months and MGUS labs every 6.    2. Iron deficiency anemia  due to chronic blood loss - Most recent labs (09/07/2023): Ferritin 74, iron saturation 21%.  Hgb 10.2/MCV 86.8. - Hemoglobin is at goal considering underlying CKD stage IIIb/IV - If patient has worsening anemia with Hgb < 10.0, will consider starting her on ESA -No  additional iron needed at this time.  3. Stage 3b chronic kidney disease (HCC) -Stable.  She is followed by Dr. Wolfgang Phoenix.  4. Adenomatous polyp of ascending colon -Colonoscopy from December 2024 showed 1 polyp and a anal protrusion and she was referred to a colorectal specialist.  She has declined referral at this time but will follow-up after her cruise in April.  PLAN SUMMARY: >> Labs in 4 months = CBC/D, CMP, LDH, ferritin, iron/TIBC >> OFFICE visit in 3 months (1 week after lab)     REVIEW OF SYSTEMS:  Review of Systems  Constitutional:  Positive for fatigue.  Gastrointestinal:  Positive for constipation.  Psychiatric/Behavioral:  Positive for sleep disturbance.      PHYSICAL EXAM:  ECOG PERFORMANCE STATUS: 1 - Symptomatic but completely ambulatory  There were no vitals filed for this visit.  There were no vitals filed for this visit.  Physical Exam Constitutional:      Appearance: Normal appearance.  Cardiovascular:     Rate and Rhythm: Normal rate and regular rhythm.  Pulmonary:     Effort: Pulmonary effort is normal.     Breath sounds: Normal breath sounds.  Abdominal:     General: Bowel sounds are normal.     Palpations: Abdomen is soft.  Musculoskeletal:        General: No swelling. Normal range of motion.  Neurological:     Mental Status: She is alert and oriented to person, place, and time. Mental status is at baseline.     PAST MEDICAL/SURGICAL HISTORY:  Past Medical History:  Diagnosis Date   Anemia    Chronic back pain    Chronic kidney disease    Depression    Diabetes mellitus without complication (HCC)    GERD (gastroesophageal reflux disease)    Heart murmur    Hyperlipidemia    Hypertension    Hypothyroidism    Pre-diabetes    Past Surgical History:  Procedure Laterality Date   ANTERIOR CERVICAL DECOMP/DISCECTOMY FUSION N/A 09/15/2021   Procedure: Anterior Cervical Decompression Discectomy Fusion Cervical three-four, Cervical four-five;   Surgeon: Bedelia Person, MD;  Location: [facility] OR;  Service: Neurosurgery;  Laterality: N/A;   BONE MARROW ASPIRATION Left 05/16/15   BONE MARROW BIOPSY Left 05/16/15   CATARACT EXTRACTION W/PHACO Right 02/21/2023   Procedure: CATARACT EXTRACTION PHACO AND INTRAOCULAR LENS PLACEMENT (IOC);  Surgeon: [name], MD;  Location: [facility] ORS;  Service: Ophthalmology;  Laterality: Right;  CDE: 7.42   CATARACT EXTRACTION W/PHACO Left 03/07/2023   Procedure: CATARACT EXTRACTION PHACO AND INTRAOCULAR LENS PLACEMENT (IOC);  Surgeon: [name], MD;  Location: [facility] ORS;  Service: Ophthalmology;  Laterality: Left;  CDE: 8.10   COLONOSCOPY  03/30/2012   Procedure: COLONOSCOPY;  Surgeon: Malissa Hippo, MD;  Location: [facility] SU[facility];  Service: Endoscopy;  Laterality: N/A;  730   COLONOSCOPY N/A 06/30/2017   Procedure: COLONOSCOPY;  Surgeon: Malissa Hippo, MD;  Location: [facility];  Service: Endoscopy;  Laterality: N/A;  2:00   COLONOSCOPY WITH PROPOFOL N/A 06/14/2023   Procedure: COLONOSCOPY WITH PROPOFOL;  Surgeon: Franky Macho, MD;  Location: [facility];  Service: Endoscopy;  Laterality: N/A;   LAMINECTOMY  1980's   POLYPECTOMY  06/30/2017   Procedure: POLYPECTOMY;  Surgeon: Malissa Hippo, MD;  Location: [facility];  Service: Endoscopy;;  colon   POLYPECTOMY  06/14/2023   Procedure: POLYPECTOMY;  Surgeon: Franky Macho, MD;  Location: [facility];  Service: Endoscopy;;   SPINE SURGERY  approx 1983   dr Jeral Fruit   TOTAL ABDOMINAL HYSTERECTOMY  approx 1993   fibroids     SOCIAL HISTORY:  Social History   Socioeconomic History   Marital status: Divorced    Spouse name: Not on file   Number of children: 3   Years of education: Not on file   Highest education level: Not on file  Occupational History   Occupation: LPN  Tobacco Use   Smoking status: Never   Smokeless tobacco: Never  Vaping Use   Vaping status: Never Used  Substance and Sexual Activity   Alcohol use: No    Drug use: No   Sexual activity: Not Currently  Other Topics Concern   Not on file  Social History Narrative   Pt has 2 living adult children, 1 desease at age 69 secondary congential heart disease.   7 grandchildren.   Social Drivers of Corporate investment banker Strain: Low Risk  (09/06/2023)   Overall Financial Resource Strain (CARDIA)    Difficulty of Paying Living Expenses: Not hard at all  Food Insecurity: No Food Insecurity (09/06/2023)   Hunger Vital Sign    Worried About Running Out of Food in the Last Year: Never true    Ran Out of Food in the Last Year: Never true  Transportation Needs: No Transportation Needs (09/06/2023)   PRAPARE - Administrator, Civil Service (Medical): No    Lack of Transportation (Non-Medical): No  Physical Activity: Sufficiently Active (09/06/2023)   Exercise Vital Sign    Days of Exercise per Week: 7 days    Minutes of Exercise per Session: 30 min  Stress: No Stress Concern Present (09/06/2023)   [employer] of Occupational Health - Occupational Stress Questionnaire    Feeling of Stress : Not at all  Social Connections: Moderately Integrated (09/06/2023)   Social Connection and Isolation Panel [NHANES]    Frequency of Communication with Friends and Family: More than three times a week    Frequency of Social Gatherings with Friends and Family: More than three times a week    Attends Religious Services: More than 4 times per year    Active Member of [employer] or Organizations: Yes    Attends Engineer, structural: More than 4 times per year    Marital Status: Divorced  Intimate Partner Violence: Not At Risk (09/06/2023)   Humiliation, Afraid, Rape, and Kick questionnaire    Fear of Current or Ex-Partner: No    Emotionally Abused: No    Physically Abused: No    Sexually Abused: No    FAMILY HISTORY:  Family History  Problem Relation Age of Onset   Heart attack Mother 89   Heart disease Mother    Cancer Mother         type unknown   Diabetes Mother    Kidney failure Father    Kidney disease Father    Myasthenia gravis Brother    Diabetes Sister    Breast cancer Sister 57   Heart disease Brother    Diabetes Sister    Sickle cell anemia Sister    Cancer Sister        unknown type  CURRENT MEDICATIONS:  Outpatient Encounter Medications as of 09/14/2023  Medication Sig Note   acetaminophen (TYLENOL) 650 MG CR tablet Take 650 mg by mouth every 8 (eight) hours as needed for pain.     amLODipine (NORVASC) 10 MG tablet Take 1 tablet (10 mg total) by mouth daily.    aspirin 81 MG EC tablet Take 81 mg by mouth daily.    benazepril (LOTENSIN) 5 MG tablet Take 1 tablet (5 mg total) by mouth daily. 12/10/2022: To be prescribed by Nephrology, Dr Gracelyn Nurse only d/w pt on 12/10/2022   buPROPion (WELLBUTRIN XL) 150 MG 24 hr tablet Take 1 tablet (150 mg total) by mouth daily.    calcitRIOL (ROCALTROL) 0.25 MCG capsule Take 0.25 mcg by mouth every Monday, Wednesday, and Friday.    Calcium-Magnesium-Vitamin D (CALCIUM 1200+D3 PO) Take 1 tablet by mouth 2 (two) times daily.    diphenhydrAMINE (BENADRYL) 25 mg capsule Take 25 mg by mouth at bedtime as needed for allergies or sleep.    hydrochlorothiazide (HYDRODIURIL) 25 MG tablet Take 1 tablet (25 mg total) by mouth daily.    levothyroxine (SYNTHROID) 88 MCG tablet TAKE 1 TABLET BY MOUTH ONCE DAILY. 1/2 HOUR BEFORE FIRST MEAL OF THE DAY. TAKE WITH WATER.    Multiple Vitamin (MULTIVITAMIN WITH MINERALS) TABS tablet Take 1 tablet by mouth daily.    omeprazole (PRILOSEC) 20 MG capsule Take 1 capsule (20 mg total) by mouth daily as needed (for acid reflux.). 12/10/2022: Takes on avg twice weekly reported 11/2022   pravastatin (PRAVACHOL) 20 MG tablet Take 1 tablet (20 mg total) by mouth every evening.    No facility-administered encounter medications on file as of 09/14/2023.    ALLERGIES:  No Known Allergies  LABORATORY DATA:  I have reviewed the labs as listed.  CBC     Component Value Date/Time   WBC 4.7 09/07/2023 0948   RBC 3.80 (L) 09/07/2023 0948   HGB 10.2 (L) 09/07/2023 0948   HGB 12.0 07/24/2021 1402   HCT 33.0 (L) 09/07/2023 0948   HCT 36.1 07/24/2021 1402   PLT 229 09/07/2023 0948   PLT 233 07/24/2021 1402   MCV 86.8 09/07/2023 0948   MCV 83 07/24/2021 1402   MCH 26.8 09/07/2023 0948   MCHC 30.9 09/07/2023 0948   RDW 13.9 09/07/2023 0948   RDW 12.4 07/24/2021 1402   LYMPHSABS 2.1 09/07/2023 0948   MONOABS 0.4 09/07/2023 0948   EOSABS 0.3 09/07/2023 0948   BASOSABS 0.0 09/07/2023 0948      Latest Ref Rng & Units 09/07/2023    9:48 AM 08/24/2023    9:48 AM 06/06/2023    2:21 PM  CMP  Glucose 70 - 99 mg/dL 742  97  595   BUN 8 - 23 mg/dL 29  23  25    Creatinine 0.44 - 1.00 mg/dL 6.38  7.56  4.33   Sodium 135 - 145 mmol/L 138  141  137   Potassium 3.5 - 5.1 mmol/L 3.6  4.0  3.7   Chloride 98 - 111 mmol/L 106  106  108   CO2 22 - 32 mmol/L 22  20  21    Calcium 8.9 - 10.3 mg/dL 9.6  9.7  9.4   Total Protein 6.5 - 8.1 g/dL 8.4  8.4    Total Bilirubin 0.0 - 1.2 mg/dL 0.4  0.4    Alkaline Phos 38 - 126 U/L 57  74    AST 15 - 41 U/L 22  22  ALT 0 - 44 U/L 19  17      DIAGNOSTIC IMAGING:  I have independently reviewed the relevant imaging and discussed with the patient.   WRAP UP:  All questions were answered. The patient knows to call the clinic with any problems, questions or concerns.  Medical decision making: Moderate  Time spent on visit: I spent 20 minutes counseling the patient face to face. The total time spent in the appointment was 30 minutes and more than 50% was on counseling.  Mauro Kaufmann, NP  09/14/23 9:15 AM

## 2023-09-15 NOTE — Telephone Encounter (Signed)
 Contacted Dr.Bhutani office but he is no longer with [facility]. Dr.Bhutani is now at [facility] in [place]. Fax clearance to [phone]

## 2023-09-18 ENCOUNTER — Other Ambulatory Visit: Payer: Self-pay | Admitting: Family Medicine

## 2023-10-06 NOTE — Telephone Encounter (Signed)
 Refaxed clearance.

## 2023-10-06 NOTE — Telephone Encounter (Signed)
 Fax from Nephrology received. Placed on provider desk for interpretation. Please advise. Thank you

## 2023-10-12 NOTE — Addendum Note (Signed)
 Addended by: Sylvester Salonga on: 10/12/2023 09:45 AM   Modules accepted: Orders

## 2023-10-12 NOTE — Addendum Note (Signed)
 Addended by: Kylene Zamarron on: 10/12/2023 09:37 AM   Modules accepted: Orders

## 2023-10-12 NOTE — Telephone Encounter (Signed)
 CT ordered and scheduled for 10/27/23. Pt contacted and made aware. Will send reminder letter also.

## 2023-10-18 ENCOUNTER — Other Ambulatory Visit: Payer: Self-pay | Admitting: Family Medicine

## 2023-10-27 ENCOUNTER — Ambulatory Visit (HOSPITAL_COMMUNITY): Admission: RE | Admit: 2023-10-27 | Source: Ambulatory Visit

## 2023-10-27 ENCOUNTER — Other Ambulatory Visit: Payer: Self-pay | Admitting: Family Medicine

## 2023-10-27 DIAGNOSIS — E785 Hyperlipidemia, unspecified: Secondary | ICD-10-CM

## 2023-10-27 DIAGNOSIS — E559 Vitamin D deficiency, unspecified: Secondary | ICD-10-CM | POA: Diagnosis not present

## 2023-10-27 DIAGNOSIS — E1169 Type 2 diabetes mellitus with other specified complication: Secondary | ICD-10-CM | POA: Diagnosis not present

## 2023-10-27 DIAGNOSIS — D631 Anemia in chronic kidney disease: Secondary | ICD-10-CM

## 2023-10-28 LAB — CMP14+EGFR
ALT: 21 IU/L (ref 0–32)
AST: 24 IU/L (ref 0–40)
Albumin: 4.1 g/dL (ref 3.7–4.7)
Alkaline Phosphatase: 70 IU/L (ref 44–121)
BUN/Creatinine Ratio: 15 (ref 12–28)
BUN: 30 mg/dL — ABNORMAL HIGH (ref 8–27)
Bilirubin Total: 0.4 mg/dL (ref 0.0–1.2)
CO2: 20 mmol/L (ref 20–29)
Calcium: 9.5 mg/dL (ref 8.7–10.3)
Chloride: 108 mmol/L — ABNORMAL HIGH (ref 96–106)
Creatinine, Ser: 2.02 mg/dL — ABNORMAL HIGH (ref 0.57–1.00)
Globulin, Total: 3.7 g/dL (ref 1.5–4.5)
Glucose: 94 mg/dL (ref 70–99)
Potassium: 4.2 mmol/L (ref 3.5–5.2)
Sodium: 139 mmol/L (ref 134–144)
Total Protein: 7.8 g/dL (ref 6.0–8.5)
eGFR: 24 mL/min/{1.73_m2} — ABNORMAL LOW (ref >59)

## 2023-10-28 LAB — HEMOGLOBIN A1C
Est. average glucose Bld gHb Est-mCnc: 134 mg/dL
Hgb A1c MFr Bld: 6.3 % — ABNORMAL HIGH (ref 4.8–5.6)

## 2023-10-28 LAB — LIPID PANEL
Chol/HDL Ratio: 2.7 ratio (ref 0.0–4.4)
Cholesterol, Total: 148 mg/dL (ref 100–199)
HDL: 55 mg/dL (ref >39)
LDL Chol Calc (NIH): 79 mg/dL (ref 0–99)
Triglycerides: 74 mg/dL (ref 0–149)
VLDL Cholesterol Cal: 14 mg/dL (ref 5–40)

## 2023-10-28 LAB — VITAMIN D 25 HYDROXY (VIT D DEFICIENCY, FRACTURES): Vit D, 25-Hydroxy: 40.5 ng/mL (ref 30.0–100.0)

## 2023-10-28 LAB — TSH: TSH: 1.31 u[IU]/mL (ref 0.450–4.500)

## 2023-11-03 ENCOUNTER — Ambulatory Visit (HOSPITAL_COMMUNITY)
Admission: RE | Admit: 2023-11-03 | Discharge: 2023-11-03 | Disposition: A | Discharge status: Home / Self Care | Source: Ambulatory Visit | Attending: Family Medicine | Admitting: Family Medicine

## 2023-11-03 ENCOUNTER — Encounter: Payer: Self-pay | Admitting: Family Medicine

## 2023-11-03 ENCOUNTER — Ambulatory Visit (INDEPENDENT_AMBULATORY_CARE_PROVIDER_SITE_OTHER): Admitting: Family Medicine

## 2023-11-03 VITALS — BP 134/78 | HR 96 | Resp 16 | Ht 65.0 in | Wt 182.0 lb

## 2023-11-03 DIAGNOSIS — J302 Other seasonal allergic rhinitis: Secondary | ICD-10-CM

## 2023-11-03 DIAGNOSIS — E039 Hypothyroidism, unspecified: Secondary | ICD-10-CM | POA: Diagnosis not present

## 2023-11-03 DIAGNOSIS — Z981 Arthrodesis status: Secondary | ICD-10-CM | POA: Diagnosis not present

## 2023-11-03 DIAGNOSIS — I10 Essential (primary) hypertension: Secondary | ICD-10-CM | POA: Diagnosis not present

## 2023-11-03 DIAGNOSIS — E1169 Type 2 diabetes mellitus with other specified complication: Secondary | ICD-10-CM | POA: Diagnosis not present

## 2023-11-03 DIAGNOSIS — J209 Acute bronchitis, unspecified: Secondary | ICD-10-CM | POA: Insufficient documentation

## 2023-11-03 DIAGNOSIS — Z0001 Encounter for general adult medical examination with abnormal findings: Secondary | ICD-10-CM | POA: Diagnosis not present

## 2023-11-03 DIAGNOSIS — R0989 Other specified symptoms and signs involving the circulatory and respiratory systems: Secondary | ICD-10-CM | POA: Diagnosis not present

## 2023-11-03 DIAGNOSIS — R9389 Abnormal findings on diagnostic imaging of other specified body structures: Secondary | ICD-10-CM | POA: Diagnosis not present

## 2023-11-03 DIAGNOSIS — R059 Cough, unspecified: Secondary | ICD-10-CM | POA: Diagnosis not present

## 2023-11-03 MED ORDER — AZELASTINE HCL 0.1 % NA SOLN: 2.0000 | Freq: Two times a day (BID) | NASAL | 1 refills | Status: DC

## 2023-11-03 MED ORDER — BENZONATATE 200 MG PO CAPS: 200.0000 mg | ORAL_CAPSULE | Freq: Two times a day (BID) | ORAL | 0 refills | Status: DC | PRN

## 2023-11-03 MED ORDER — AZITHROMYCIN 250 MG PO TABS: ORAL_TABLET | ORAL | 0 refills | Status: AC

## 2023-11-03 NOTE — Progress Notes (Signed)
 Renee Collins     MRN: [id number]      DOB: 1941/01/21  Chief Complaint  Patient presents with   Annual Exam    Cpe    Cough    Complains of c/c for last couple weeks. Denies fever. Has been taking otc cold and allergy medication as well as spoon fulls of honey but no relief     HPI: Patient is in for annual physical exam. Above health concern is also addressed. Recent labs,  are reviewed. Immunization is reviewed , and  updated if needed.   PE: BP 134/78   Pulse 96   Resp 16   Ht 5\' 5"  (1.651 m)   Wt 182 lb 0.6 oz (82.6 kg)   SpO2 97%   BMI 30.29 kg/m   Pleasant  female, alert and oriented x 3, in no cardio-pulmonary distress. Afebrile. HEENT No facial trauma or asymetry. Sinuses non tender.  Extra occullar muscles intact.. External ears normal, . Neck: supple, no adenopathy,JVD or thyromegaly.No bruits.  Chest: Decreased air entry in bases with crackles , no wheezes Non tender to palpation   Cardiovascular system; Heart sounds normal,  S1 and  S2 ,no S3.  No murmur, or thrill. Apical beat not displaced Peripheral pulses normal.  Abdomen: Soft, non tender, no organomegaly or masses.  Musculoskeletal exam: Decreased though adequate ROM of spine, hips , shoulders and knees. No deformity ,swelling or crepitus noted. No muscle wasting or atrophy.   Neurologic: Cranial nerves 2 to 12 intact. Power, tone ,sensation and reflexes normal throughout. No disturbance in gait. No tremor.  Skin: Intact, no ulceration, erythema , scaling or rash noted. Pigmentation normal throughout  Psych; Normal mood and affect. Judgement and concentration normal   Assessment & Plan:  Encounter for Medicare annual examination with abnormal findings Annual exam as documented. Counseling done  re healthy lifestyle involving commitment to 150 minutes exercise per week, heart healthy diet, and attaining healthy weight.The importance of adequate sleep also discussed. Regular  seat belt use and home safety, is also discussed. Changes in health habits are decided on by the patient with goals and time frames  set for achieving them. Immunization and cancer screening needs are specifically addressed at this visit.   Acute bronchitis 1 week history of worsening symtoms, CXR, tesalon perle , Z pack  Seasonal allergies Uncontrolled, astellin prescribed  Essential hypertension Controlled, no change in medication DASH diet and commitment to daily physical activity for a minimum of 30 minutes discussed and encouraged, as a part of hypertension management. The importance of attaining a healthy weight is also discussed.     11/03/2023    8:43 AM 09/14/2023    9:24 AM 09/06/2023    8:14 AM 08/23/2023   10:40 AM 07/22/2023   10:21 AM 06/16/2023    8:38 AM 06/14/2023    9:57 AM  BP/Weight  Systolic BP 134 143 --  125 136 108  Diastolic BP 78 79 --  77 68 59  Wt. (Lbs) 182.04 178.57 176 173.5 173.5 180   BMI 30.29 kg/m2 29.72 kg/m2 29.29 kg/m2 28 kg/m2 28 kg/m2 29.05 kg/m2        Hypothyroid Controlled, no change in medication Updated lab needed at/ before next visit.   Type 2 diabetes mellitus with other specified complication (HCC) Diabetes associated with hypertension, hyperlipidemia, and depression  Ms. Dynes is reminded of the importance of commitment to daily physical activity for 30 minutes or more, as able and  the need to limit carbohydrate intake to 30 to 60 grams per meal to help with blood sugar control.   Diet controlled  Ms. Rittner is reminded of the importance of daily foot exam, annual eye examination, and good blood sugar, blood pressure and cholesterol control.     Latest Ref Rng & Units 10/27/2023   10:30 AM 09/07/2023    9:48 AM 08/24/2023    9:48 AM 06/06/2023    2:21 PM 06/02/2023   11:45 AM  Diabetic Labs  HbA1c 4.8 - 5.6 % 6.3   6.3     Micro/Creat Ratio 0 - 29 mg/g creat     91   Chol 100 - 199 mg/dL 161   096     HDL >04 mg/dL 55   72      Calc LDL 0 - 99 mg/dL 79   79     Triglycerides 0 - 149 mg/dL 74   67     Creatinine 0.57 - 1.00 mg/dL 5.40  9.81  1.91  4.78        11/03/2023    8:43 AM 09/14/2023    9:24 AM 09/06/2023    8:14 AM 08/23/2023   10:40 AM 07/22/2023   10:21 AM 06/16/2023    8:38 AM 06/14/2023    9:57 AM  BP/Weight  Systolic BP 134 143 --  125 136 108  Diastolic BP 78 79 --  77 68 59  Wt. (Lbs) 182.04 178.57 176 173.5 173.5 180   BMI 30.29 kg/m2 29.72 kg/m2 29.29 kg/m2 28 kg/m2 28 kg/m2 29.05 kg/m2       Latest Ref Rng & Units 12/08/2022   12:00 AM 02/23/2022    9:20 AM  Foot/eye exam completion dates  Eye Exam No Retinopathy No Retinopathy       Foot Form Completion   Done     This result is from an external source.

## 2023-11-03 NOTE — Assessment & Plan Note (Signed)

## 2023-11-03 NOTE — Assessment & Plan Note (Signed)
 1 week history of worsening symtoms, CXR, tesalon perle , Z pack

## 2023-11-03 NOTE — Assessment & Plan Note (Signed)
 Uncontrolled, astellin prescribed

## 2023-11-03 NOTE — Patient Instructions (Addendum)
 Follow-up early to mid October, call if you need me before.  Chest x-ray today.  Z-Pak, Tessalon Perles and Astelin are prescribed for uncontrolled allergies and acute bronchitis.  Please take over-the-counter Sudafed 1 tablet once daily for the next 5 days.  Please ensure you remain adequately hydrated by drinking sufficient amount of water .  Please schedule bone density test at checkout.  Fasting lipid CMP and EGFR HbA1c and TSH to be drawn 3 to 5 days before your October visit.  Thanks for choosing [facility], we consider it a privelige to serve you.

## 2023-11-07 NOTE — Assessment & Plan Note (Signed)
 Controlled, no change in medication Updated lab needed at/ before next visit.

## 2023-11-07 NOTE — Assessment & Plan Note (Addendum)
 Diabetes associated with hypertension, hyperlipidemia, and depression  Renee Collins is reminded of the importance of commitment to daily physical activity for 30 minutes or more, as able and the need to limit carbohydrate intake to 30 to 60 grams per meal to help with blood sugar control.   Diet controlled  Renee Collins is reminded of the importance of daily foot exam, annual eye examination, and good blood sugar, blood pressure and cholesterol control.     Latest Ref Rng & Units 10/27/2023   10:30 AM 09/07/2023    9:48 AM 08/24/2023    9:48 AM 06/06/2023    2:21 PM 06/02/2023   11:45 AM  Diabetic Labs  HbA1c 4.8 - 5.6 % 6.3   6.3     Micro/Creat Ratio 0 - 29 mg/g creat     91   Chol 100 - 199 mg/dL 557   322     HDL >02 mg/dL 55   72     Calc LDL 0 - 99 mg/dL 79   79     Triglycerides 0 - 149 mg/dL 74   67     Creatinine 0.57 - 1.00 mg/dL 5.42  7.06  2.37  6.28        11/03/2023    8:43 AM 09/14/2023    9:24 AM 09/06/2023    8:14 AM 08/23/2023   10:40 AM 07/22/2023   10:21 AM 06/16/2023    8:38 AM 06/14/2023    9:57 AM  BP/Weight  Systolic BP 134 143 --  125 136 108  Diastolic BP 78 79 --  77 68 59  Wt. (Lbs) 182.04 178.57 176 173.5 173.5 180   BMI 30.29 kg/m2 29.72 kg/m2 29.29 kg/m2 28 kg/m2 28 kg/m2 29.05 kg/m2       Latest Ref Rng & Units 12/08/2022   12:00 AM 02/23/2022    9:20 AM  Foot/eye exam completion dates  Eye Exam No Retinopathy No Retinopathy       Foot Form Completion   Done     This result is from an external source.

## 2023-11-07 NOTE — Assessment & Plan Note (Signed)
 Controlled, no change in medication DASH diet and commitment to daily physical activity for a minimum of 30 minutes discussed and encouraged, as a part of hypertension management. The importance of attaining a healthy weight is also discussed.     11/03/2023    8:43 AM 09/14/2023    9:24 AM 09/06/2023    8:14 AM 08/23/2023   10:40 AM 07/22/2023   10:21 AM 06/16/2023    8:38 AM 06/14/2023    9:57 AM  BP/Weight  Systolic BP 134 143 --  125 136 108  Diastolic BP 78 79 --  77 68 59  Wt. (Lbs) 182.04 178.57 176 173.5 173.5 180   BMI 30.29 kg/m2 29.72 kg/m2 29.29 kg/m2 28 kg/m2 28 kg/m2 29.05 kg/m2

## 2023-12-02 ENCOUNTER — Other Ambulatory Visit: Payer: Self-pay | Admitting: Family Medicine

## 2023-12-02 DIAGNOSIS — E041 Nontoxic single thyroid nodule: Secondary | ICD-10-CM

## 2023-12-13 ENCOUNTER — Telehealth (INDEPENDENT_AMBULATORY_CARE_PROVIDER_SITE_OTHER): Payer: Self-pay | Admitting: Gastroenterology

## 2023-12-13 NOTE — Telephone Encounter (Signed)
 Patient came to front desk asking if she could be re-referred to [facility]. We had sent a referral back in December, but she didn't follow up with that and requested another. Please advise. [phone]

## 2023-12-14 NOTE — Telephone Encounter (Signed)
 Pt contacted. Pt advised that she would need to call Dr.Gross to schedule follow up. Pt verbalized understanding.

## 2023-12-19 DIAGNOSIS — K643 Fourth degree hemorrhoids: Secondary | ICD-10-CM | POA: Diagnosis not present

## 2023-12-19 DIAGNOSIS — Z860101 Personal history of adenomatous and serrated colon polyps: Secondary | ICD-10-CM | POA: Diagnosis not present

## 2023-12-20 ENCOUNTER — Ambulatory Visit: Payer: Self-pay | Admitting: Surgery

## 2023-12-20 ENCOUNTER — Other Ambulatory Visit: Payer: Self-pay | Admitting: Surgery

## 2023-12-20 NOTE — Progress Notes (Signed)
 Sent message, via epic in basket, requesting orders in epic from Careers adviser.

## 2023-12-26 DIAGNOSIS — D631 Anemia in chronic kidney disease: Secondary | ICD-10-CM | POA: Diagnosis not present

## 2023-12-26 DIAGNOSIS — N189 Chronic kidney disease, unspecified: Secondary | ICD-10-CM | POA: Diagnosis not present

## 2023-12-26 DIAGNOSIS — R809 Proteinuria, unspecified: Secondary | ICD-10-CM | POA: Diagnosis not present

## 2023-12-26 NOTE — Patient Instructions (Signed)
 SURGICAL WAITING ROOM VISITATION  Patients having surgery or a procedure may have no more than 2 support people in the waiting area - these visitors may rotate.    Children under the age of 47 must have an adult with them who is not the patient.  Visitors with respiratory illnesses are discouraged from visiting and should remain at home.  If the patient needs to stay at the hospital during part of their recovery, the visitor guidelines for inpatient rooms apply. Pre-op nurse will coordinate an appropriate time for 1 support person to accompany patient in pre-op.  This support person may not rotate.    Please refer to the [facility] website for the visitor guidelines for Inpatients (after your surgery is over and you are in a regular room).       Your procedure is scheduled on:  01/05/2024    Report to [facility] Main Entrance    Report to admitting at  1200 noon     Call this number if you have problems the morning of surgery [phone]   Do not eat food : or drink liquids After Midnight.           Follow Rectal Prep instructions per MD.                             If you have questions, please contact your surgeon's office.   FOLLOW BOWEL PREP AND ANY ADDITIONAL PRE OP INSTRUCTIONS YOU RECEIVED FROM YOUR SURGEON'S OFFICE!!!     Oral Hygiene is also important to reduce your risk of infection.                                    Remember - BRUSH YOUR TEETH THE MORNING OF SURGERY WITH YOUR REGULAR TOOTHPASTE  DENTURES WILL BE REMOVED PRIOR TO SURGERY PLEASE DO NOT APPLY Poly grip OR ADHESIVES!!!   Do NOT smoke after Midnight   Stop all vitamins and herbal supplements 7 days before surgery.   Take these medicines the morning of surgery with A SIP OF WATER :  amlodipine , wellbutrin , omeprazole  if needed, synthroid ,   DO NOT TAKE ANY ORAL DIABETIC MEDICATIONS DAY OF YOUR SURGERY  Bring CPAP mask and tubing day of surgery.                              You may  not have any metal on your body including hair pins, jewelry, and body piercing             Do not wear make-up, lotions, powders, perfumes/cologne, or deodorant  Do not wear nail polish including gel and S&S, artificial/acrylic nails, or any other type of covering on natural nails including finger and toenails. If you have artificial nails, gel coating, etc. that needs to be removed by a nail salon please have this removed prior to surgery or surgery may need to be canceled/ delayed if the surgeon/ anesthesia feels like they are unable to be safely monitored.   Do not shave  48 hours prior to surgery.               Men may shave face and neck.   Do not bring valuables to the hospital. [facility] IS NOT             RESPONSIBLE  FOR VALUABLES.   Contacts, glasses, dentures or bridgework may not be worn into surgery.   Bring small overnight bag day of surgery.   DO NOT BRING YOUR HOME MEDICATIONS TO THE HOSPITAL. PHARMACY WILL DISPENSE MEDICATIONS LISTED ON YOUR MEDICATION LIST TO YOU DURING YOUR ADMISSION IN THE HOSPITAL!    Patients discharged on the day of surgery will not be allowed to drive home.  Someone NEEDS to stay with you for the first 24 hours after anesthesia.   Special Instructions: Bring a copy of your healthcare power of attorney and living will documents the day of surgery if you haven't scanned them before.              Please read over the following fact sheets you were given: IF YOU HAVE QUESTIONS ABOUT YOUR PRE-OP INSTRUCTIONS PLEASE CALL [phone]   If you received a COVID test during your pre-op visit  it is requested that you wear a mask when out in public, stay away from anyone that may not be feeling well and notify your surgeon if you develop symptoms. If you test positive for Covid or have been in contact with anyone that has tested positive in the last 10 days please notify you surgeon.    [facility] - Preparing for Surgery Before surgery, you can play  an important role.  Because skin is not sterile, your skin needs to be as free of germs as possible.  You can reduce the number of germs on your skin by washing with CHG (chlorahexidine gluconate) soap before surgery.  CHG is an antiseptic cleaner which kills germs and bonds with the skin to continue killing germs even after washing. Please DO NOT use if you have an allergy to CHG or antibacterial soaps.  If your skin becomes reddened/irritated stop using the CHG and inform your nurse when you arrive at Short Stay. Do not shave (including legs and underarms) for at least 48 hours prior to the first CHG shower.  You may shave your face/neck. Please follow these instructions carefully:  1.  Shower with CHG Soap the night before surgery and the  morning of Surgery.  2.  If you choose to wash your hair, wash your hair first as usual with your  normal  shampoo.  3.  After you shampoo, rinse your hair and body thoroughly to remove the  shampoo.                           4.  Use CHG as you would any other liquid soap.  You can apply chg directly  to the skin and wash                       Gently with a scrungie or clean washcloth.  5.  Apply the CHG Soap to your body ONLY FROM THE NECK DOWN.   Do not use on face/ open                           Wound or open sores. Avoid contact with eyes, ears mouth and genitals (private parts).                       Wash face,  Genitals (private parts) with your normal soap.             6.  Wash thoroughly, paying special attention  to the area where your surgery  will be performed.  7.  Thoroughly rinse your body with warm water  from the neck down.  8.  DO NOT shower/wash with your normal soap after using and rinsing off  the CHG Soap.                9.  Pat yourself dry with a clean towel.            10.  Wear clean pajamas.            11.  Place clean sheets on your bed the night of your first shower and do not  sleep with pets. Day of Surgery : Do not apply any  lotions/deodorants the morning of surgery.  Please wear clean clothes to the hospital/surgery center.  FAILURE TO FOLLOW THESE INSTRUCTIONS MAY RESULT IN THE CANCELLATION OF YOUR SURGERY PATIENT SIGNATURE_________________________________  NURSE SIGNATURE__________________________________  ________________________________________________________________________

## 2023-12-26 NOTE — Progress Notes (Signed)
 Anesthesia Review:  PCP: Rollene Pesa- LVO 11/03/23  Cardiologist :  PPM/ ICD: Device Orders: Rep Notified:  Chest x-ray : 11/03/23-2 view  EKG : 06/06/23  Echo : 08/17/21  Stress test: Cardiac Cath :   Activity level:  Sleep Study/ CPAP : Fasting Blood Sugar :      / Checks Blood Sugar -- times a day:     DM- type Hgba1c-  10/27/23-- 6.3   Blood Thinner/ Instructions Cherre Dose: ASA / Instructions/ Last Dose :    81 mg aspirin

## 2023-12-27 ENCOUNTER — Encounter (HOSPITAL_COMMUNITY): Payer: Self-pay | Admitting: Surgery

## 2023-12-27 ENCOUNTER — Other Ambulatory Visit: Payer: Self-pay

## 2023-12-27 ENCOUNTER — Encounter (HOSPITAL_COMMUNITY)
Admission: RE | Admit: 2023-12-27 | Discharge: 2023-12-27 | Disposition: A | Discharge status: Home / Self Care | Source: Ambulatory Visit | Attending: Family Medicine | Admitting: Family Medicine

## 2023-12-27 ENCOUNTER — Ambulatory Visit: Payer: No Typology Code available for payment source | Admitting: Podiatry

## 2023-12-27 ENCOUNTER — Encounter: Payer: Self-pay | Admitting: Podiatry

## 2023-12-27 DIAGNOSIS — M79675 Pain in left toe(s): Secondary | ICD-10-CM | POA: Diagnosis not present

## 2023-12-27 DIAGNOSIS — Z794 Long term (current) use of insulin: Secondary | ICD-10-CM | POA: Diagnosis not present

## 2023-12-27 DIAGNOSIS — B351 Tinea unguium: Secondary | ICD-10-CM | POA: Diagnosis not present

## 2023-12-27 DIAGNOSIS — I080 Rheumatic disorders of both mitral and aortic valves: Secondary | ICD-10-CM | POA: Insufficient documentation

## 2023-12-27 DIAGNOSIS — N1832 Chronic kidney disease, stage 3b: Secondary | ICD-10-CM

## 2023-12-27 DIAGNOSIS — M79674 Pain in right toe(s): Secondary | ICD-10-CM | POA: Diagnosis not present

## 2023-12-27 DIAGNOSIS — E0822 Diabetes mellitus due to underlying condition with diabetic chronic kidney disease: Secondary | ICD-10-CM

## 2023-12-27 NOTE — Progress Notes (Signed)
 Phone call completed to complete med hx and preop instructons on 12/27/23.  PT to come in on 12/28/23 for albs at 1100.

## 2023-12-28 ENCOUNTER — Encounter (HOSPITAL_COMMUNITY)
Admission: RE | Admit: 2023-12-28 | Discharge: 2023-12-28 | Disposition: A | Discharge status: Home / Self Care | Source: Ambulatory Visit | Attending: Surgery | Admitting: Surgery

## 2023-12-28 VITALS — BP 138/69 | HR 80 | Temp 98.5°F | Resp 16 | Ht 66.0 in | Wt 182.1 lb

## 2023-12-28 DIAGNOSIS — Z01812 Encounter for preprocedural laboratory examination: Secondary | ICD-10-CM | POA: Insufficient documentation

## 2023-12-28 DIAGNOSIS — R7303 Prediabetes: Secondary | ICD-10-CM | POA: Insufficient documentation

## 2023-12-28 DIAGNOSIS — I129 Hypertensive chronic kidney disease with stage 1 through stage 4 chronic kidney disease, or unspecified chronic kidney disease: Secondary | ICD-10-CM | POA: Insufficient documentation

## 2023-12-28 DIAGNOSIS — F32A Depression, unspecified: Secondary | ICD-10-CM | POA: Insufficient documentation

## 2023-12-28 DIAGNOSIS — I350 Nonrheumatic aortic (valve) stenosis: Secondary | ICD-10-CM | POA: Insufficient documentation

## 2023-12-28 DIAGNOSIS — I080 Rheumatic disorders of both mitral and aortic valves: Secondary | ICD-10-CM | POA: Diagnosis not present

## 2023-12-28 DIAGNOSIS — I3481 Nonrheumatic mitral (valve) annulus calcification: Secondary | ICD-10-CM | POA: Insufficient documentation

## 2023-12-28 DIAGNOSIS — E039 Hypothyroidism, unspecified: Secondary | ICD-10-CM | POA: Insufficient documentation

## 2023-12-28 DIAGNOSIS — N189 Chronic kidney disease, unspecified: Secondary | ICD-10-CM | POA: Insufficient documentation

## 2023-12-28 DIAGNOSIS — Z981 Arthrodesis status: Secondary | ICD-10-CM | POA: Insufficient documentation

## 2023-12-28 DIAGNOSIS — I517 Cardiomegaly: Secondary | ICD-10-CM | POA: Diagnosis present

## 2023-12-28 DIAGNOSIS — K449 Diaphragmatic hernia without obstruction or gangrene: Secondary | ICD-10-CM | POA: Insufficient documentation

## 2023-12-28 DIAGNOSIS — E785 Hyperlipidemia, unspecified: Secondary | ICD-10-CM | POA: Insufficient documentation

## 2023-12-28 DIAGNOSIS — Z01818 Encounter for other preprocedural examination: Secondary | ICD-10-CM

## 2023-12-28 DIAGNOSIS — K219 Gastro-esophageal reflux disease without esophagitis: Secondary | ICD-10-CM | POA: Insufficient documentation

## 2023-12-28 LAB — CBC
HCT: 34.2 % — ABNORMAL LOW (ref 36.0–46.0)
Hemoglobin: 10.6 g/dL — ABNORMAL LOW (ref 12.0–15.0)
MCH: 26.9 pg (ref 26.0–34.0)
MCHC: 31 g/dL (ref 30.0–36.0)
MCV: 86.8 fL (ref 80.0–100.0)
Platelets: 198 10*3/uL (ref 150–400)
RBC: 3.94 MIL/uL (ref 3.87–5.11)
RDW: 12.8 % (ref 11.5–15.5)
WBC: 4.1 10*3/uL (ref 4.0–10.5)
nRBC: 0 % (ref 0.0–0.2)

## 2023-12-28 LAB — BASIC METABOLIC PANEL WITH GFR
Anion gap: 8 (ref 5–15)
BUN: 35 mg/dL — ABNORMAL HIGH (ref 8–23)
CO2: 22 mmol/L (ref 22–32)
Calcium: 9.4 mg/dL (ref 8.9–10.3)
Chloride: 107 mmol/L (ref 98–111)
Creatinine, Ser: 2.05 mg/dL — ABNORMAL HIGH (ref 0.44–1.00)
GFR, Estimated: 23 mL/min — ABNORMAL LOW (ref >60)
Glucose, Bld: 91 mg/dL (ref 70–99)
Potassium: 4.2 mmol/L (ref 3.5–5.1)
Sodium: 137 mmol/L (ref 135–145)

## 2023-12-28 LAB — GLUCOSE, CAPILLARY: Glucose-Capillary: 89 mg/dL (ref 70–99)

## 2023-12-28 NOTE — Progress Notes (Addendum)
 DM- type 2- on no meds controlled by diet and exerciser per pt.  Does not check glucose at home    PT came in on 12/28/23 and picked up bag with instrucdtions vitals completed and labs completed.    Followed by Dr Rachele in [place] for kidney. LOV 05/2023     CBC done 12/28/23 with hgb of 10.6 routed to DR Gross on 12/28/23.  BMP done 12/28/23 routed to DR Gross on 12/28/23.

## 2023-12-29 ENCOUNTER — Encounter (HOSPITAL_COMMUNITY): Payer: Self-pay

## 2023-12-29 NOTE — Progress Notes (Signed)
  Subjective:  Patient ID: Renee Collins, female    DOB: 1940/09/24,  MRN: [id number]  Renee Collins presents to clinic today for at risk foot care. Pt has h/o NIDDM with chronic kidney disease and painful thick toenails that are difficult to trim. Pain interferes with ambulation. Aggravating factors include wearing enclosed shoe gear. Pain is relieved with periodic professional debridement.  Chief Complaint  Patient presents with   Diabetes    My toenails  Dr. Rollene Pesa - 11/03/2023; A1c - 6.3   New problem(s): None.   PCP is Pesa Rollene BRAVO, MD.  No Known Allergies  Review of Systems: Negative except as noted in the HPI.  Objective: No changes noted in today's physical examination. There were no vitals filed for this visit. Renee Collins is a pleasant 83 y.o. female WD, WN in NAD. AAO x 3.  Vascular Examination: Capillary refill time immediate b/l. Palpable pedal pulses. Pedal hair present b/l. No pain with calf compression b/l. Skin temperature gradient WNL b/l. No cyanosis or clubbing b/l. No ischemia or gangrene noted b/l. No edema noted b/l LE.  Neurological Examination: Sensation grossly intact b/l with 10 gram monofilament. Vibratory sensation intact b/l.   Dermatological Examination: Pedal skin with normal turgor, texture and tone b/l.  No open wounds. No interdigital macerations.   Toenails 1-5 b/l thick, discolored, elongated with subungual debris and pain on dorsal palpation.   No corns, calluses nor porokeratotic lesions noted.  Musculoskeletal Examination: Muscle strength 5/5 to all lower extremity muscle groups bilaterally. No pain, crepitus or joint limitation noted with ROM bilateral LE. No gross bony deformities bilaterally.  Radiographs: None Last A1c:      Latest Ref Rng & Units 10/27/2023   10:30 AM 08/24/2023    9:48 AM 04/14/2023   11:32 AM  Hemoglobin A1C  Hemoglobin-A1c 4.8 - 5.6 % 6.3  6.3  6.6      Assessment/Plan: 1. Pain due to  onychomycosis of toenails of both feet   2. 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 All patient's and/or POA's questions/concerns addressed on today's visit. Toenails 1-5 debrided in length and girth without incident. Continue foot and shoe inspections daily. Monitor blood glucose per PCP/Endocrinologist's recommendations. Continue soft, supportive shoe gear daily. Report any pedal injuries to medical professional. Call office if there are any questions/concerns. -Patient/POA to call should there be question/concern in the interim.   Return in about 3 months (around 03/28/2024).  Renee Collins, DPM      [facility] LOCATION: [address] [place] [address]                   Office [phone]   [facility] LOCATION: [address] [place], [place] [address] Office [phone]

## 2023-12-29 NOTE — Anesthesia Preprocedure Evaluation (Addendum)
 Anesthesia Evaluation  Patient identified by MRN, date of birth, ID band Patient awake    Reviewed: Allergy & Precautions, H&P , NPO status , Patient's Chart, lab work & pertinent test results  Airway Mallampati: II  TM Distance: >3 FB Neck ROM: Full    Dental  (+) Edentulous Upper, Edentulous Lower   Pulmonary neg pulmonary ROS, neg sleep apnea   Pulmonary exam normal breath sounds clear to auscultation       Cardiovascular hypertension, (-) Past MI Normal cardiovascular exam+ Valvular Problems/Murmurs AS  Rhythm:Regular Rate:Normal     Neuro/Psych  PSYCHIATRIC DISORDERS  Depression    negative neurological ROS     GI/Hepatic Neg liver ROS,GERD  ,,  Endo/Other  diabetes, Type 2Hypothyroidism    Renal/GU Renal InsufficiencyRenal disease  negative genitourinary   Musculoskeletal  (+) Arthritis ,    Abdominal   Peds negative pediatric ROS (+)  Hematology negative hematology ROS (+)   Anesthesia Other Findings   Reproductive/Obstetrics negative OB ROS                              Anesthesia Physical Anesthesia Plan  ASA: 3  Anesthesia Plan: General   Post-op Pain Management: Tylenol  PO (pre-op)*   Induction: Intravenous  PONV Risk Score and Plan: 3 and Ondansetron , Dexamethasone  and Treatment may vary due to age or medical condition  Airway Management Planned: Oral ETT  Additional Equipment:   Intra-op Plan:   Post-operative Plan: Extubation in OR  Informed Consent: I have reviewed the patients History and Physical, chart, labs and discussed the procedure including the risks, benefits and alternatives for the proposed anesthesia with the patient or authorized representative who has indicated his/her understanding and acceptance.     Dental advisory given  Plan Discussed with: CRNA  Anesthesia Plan Comments: (See PAT note from 7/2 Renee Collins is an 83 yo female who  presents to PAT prior to HEMORRHOIDECTOMY WITH LIGATION & HEMORRHOIDOPEXY on 01/05/24 with Dr. Sheldon. PMH of HTN, mild aortic stenosis, GERD, large hiatal hernia, prediabetes, hypothyroid, CKD, depression, s/p ACDF (2023).   Pt with hx of mild aortic stenosis by Echo in 2023. Aortic valve mean gradient measures 8.7 mmHg. Aortic valve peak gradient measures 17.4 mmHg. Aortic valve area, by VTI measures 1.54. cm. Normal LVEF.    Patient has hx of CKD and follows with Nephrology. Kidney function stable on PAT labs   Pt seen by PCP on 11/03/23. Diagnosed with acute bronchitis. BP controlled. Last A1c in prediabetes range. Anemia stable. )         Anesthesia Quick Evaluation

## 2023-12-29 NOTE — Progress Notes (Signed)
 DISCUSSION: Renee Collins is an 83 yo female who presents to PAT prior to HEMORRHOIDECTOMY WITH LIGATION & HEMORRHOIDOPEXY on 01/05/24 with Dr. Sheldon. PMH of HTN, mild aortic stenosis, GERD, large hiatal hernia, prediabetes, hypothyroid, CKD, depression, s/p ACDF (2023).  Pt with hx of mild aortic stenosis by Echo in 2023. Aortic valve mean gradient measures 8.7 mmHg. Aortic valve peak gradient measures 17.4 mmHg. Aortic valve area, by VTI measures 1.54. cm. Normal LVEF.   Patient has hx of CKD and follows with Nephrology. Kidney function stable on PAT labs  Pt seen by PCP on 11/03/23. Diagnosed with acute bronchitis. BP controlled. Last A1c in prediabetes range. Anemia stable.  VS: BP 138/69   Pulse 80   Temp 36.9 C (Oral)   Resp 16   Ht 5' 6 (1.676 m)   Wt 82.6 kg   SpO2 100%   BMI 29.39 kg/m   PROVIDERS: Antonetta Rollene BRAVO, MD   LABS: Labs reviewed: Acceptable for surgery. (all labs ordered are listed, but only abnormal results are displayed)  Labs Reviewed  BASIC METABOLIC PANEL WITH GFR - Abnormal; Notable for the following components:      Result Value   BUN 35 (*)    Creatinine, Ser 2.05 (*)    GFR, Estimated 23 (*)    All other components within normal limits  CBC - Abnormal; Notable for the following components:   Hemoglobin 10.6 (*)    HCT 34.2 (*)    All other components within normal limits  GLUCOSE, CAPILLARY     IMAGES: CXR 11/03/23:  FINDINGS: Elevation of the right hemidiaphragm. Unchanged streaky atelectasis in both lung bases. No focal airspace consolidation, pleural effusion, or pneumothorax. Hiatal hernia. No cardiomegaly. No acute fracture or destructive lesion.   IMPRESSION: Partially visualized cervical fusion hardware.  EKG 07/06/22:  NSR   CV: Echo 08/17/2021:  IMPRESSIONS    1. Left ventricular ejection fraction, by estimation, is 65 to 70%. The left ventricle has normal function. Left ventricular endocardial border not optimally  defined to evaluate regional wall motion. There is mild left ventricular hypertrophy. Left ventricular diastolic parameters are consistent with Grade I diastolic dysfunction (impaired relaxation).  2. Right ventricular systolic function is normal. The right ventricular size is normal. There is normal pulmonary artery systolic pressure.  3. The mitral valve is abnormal. No evidence of mitral valve regurgitation. Mild mitral stenosis.Mean gradient 4 mmHg. Severe mitral annular calcification.  4. The aortic valve was not well visualized. Aortic valve regurgitation is not visualized. Mild aortic valve stenosis. Aortic valve mean gradient measures 8.7 mmHg. Aortic valve peak gradient measures 17.4 mmHg. Aortic valve area, by VTI measures 1.54 cm.  5. The inferior vena cava is normal in size with greater than 50% respiratory variability, suggesting right atrial pressure of 3 mmHg. Past Medical History:  Diagnosis Date   Anemia    Arthritis    Chronic back pain    Chronic kidney disease    Depression    Diabetes mellitus without complication (HCC)    GERD (gastroesophageal reflux disease)    Heart murmur    Hyperlipidemia    Hypertension    Hypothyroidism     Past Surgical History:  Procedure Laterality Date   ANTERIOR CERVICAL DECOMP/DISCECTOMY FUSION N/A 09/15/2021   Procedure: Anterior Cervical Decompression Discectomy Fusion Cervical three-four, Cervical four-five;  Surgeon: Debby Dorn MATSU, MD;  Location: [facility] OR;  Service: Neurosurgery;  Laterality: N/A;   BONE MARROW ASPIRATION Left 05/16/15   BONE MARROW  BIOPSY Left 05/16/15   CATARACT EXTRACTION W/PHACO Right 02/21/2023   Procedure: CATARACT EXTRACTION PHACO AND INTRAOCULAR LENS PLACEMENT (IOC);  Surgeon: Harrie Agent, MD;  Location: [facility] ORS;  Service: Ophthalmology;  Laterality: Right;  CDE: 7.42   CATARACT EXTRACTION W/PHACO Left 03/07/2023   Procedure: CATARACT EXTRACTION PHACO AND INTRAOCULAR LENS PLACEMENT (IOC);   Surgeon: Harrie Agent, MD;  Location: [facility] ORS;  Service: Ophthalmology;  Laterality: Left;  CDE: 8.10   COLONOSCOPY  03/30/2012   Procedure: COLONOSCOPY;  Surgeon: Claudis RAYMOND Rivet, MD;  Location: [facility] SU[facility];  Service: Endoscopy;  Laterality: N/A;  730   COLONOSCOPY N/A 06/30/2017   Procedure: COLONOSCOPY;  Surgeon: Rivet Claudis RAYMOND, MD;  Location: [facility];  Service: Endoscopy;  Laterality: N/A;  2:00   COLONOSCOPY WITH PROPOFOL  N/A 06/14/2023   Procedure: COLONOSCOPY WITH PROPOFOL ;  Surgeon: Cinderella Deatrice FALCON, MD;  Location: [facility];  Service: Endoscopy;  Laterality: N/A;   LAMINECTOMY  1980's   POLYPECTOMY  06/30/2017   Procedure: POLYPECTOMY;  Surgeon: Rivet Claudis RAYMOND, MD;  Location: [facility];  Service: Endoscopy;;  colon   POLYPECTOMY  06/14/2023   Procedure: POLYPECTOMY;  Surgeon: Cinderella Deatrice FALCON, MD;  Location: [facility];  Service: Endoscopy;;   SPINE SURGERY  approx 1983   dr leeann   TOTAL ABDOMINAL HYSTERECTOMY  approx 1993   fibroids     MEDICATIONS:  acetaminophen  (TYLENOL ) 650 MG CR tablet   amLODipine  (NORVASC ) 10 MG tablet   aspirin 81 MG EC tablet   azelastine  (ASTELIN ) 0.1 % nasal spray   benazepril  (LOTENSIN ) 5 MG tablet   benzonatate  (TESSALON ) 200 MG capsule   buPROPion  (WELLBUTRIN  XL) 150 MG 24 hr tablet   calcitRIOL (ROCALTROL) 0.25 MCG capsule   Calcium-Magnesium-Vitamin D  (CALCIUM 1200+D3 PO)   diphenhydrAMINE (BENADRYL) 25 mg capsule   hydrochlorothiazide  (HYDRODIURIL ) 25 MG tablet   levothyroxine  (SYNTHROID ) 88 MCG tablet   Misc Natural Products (JOINT HEALTH PO)   Multiple Vitamin (MULTIVITAMIN WITH MINERALS) TABS tablet   omeprazole  (PRILOSEC) 20 MG capsule   pravastatin  (PRAVACHOL ) 20 MG tablet   No current facility-administered medications for this encounter.   Burnard CHRISTELLA Odis DEVONNA [facility] Surgical Short Stay/Anesthesiology [facility] Phone [phone] 12/29/2023 10:26 AM

## 2024-01-02 DIAGNOSIS — D472 Monoclonal gammopathy: Secondary | ICD-10-CM | POA: Diagnosis not present

## 2024-01-02 DIAGNOSIS — I129 Hypertensive chronic kidney disease with stage 1 through stage 4 chronic kidney disease, or unspecified chronic kidney disease: Secondary | ICD-10-CM | POA: Diagnosis not present

## 2024-01-02 DIAGNOSIS — N184 Chronic kidney disease, stage 4 (severe): Secondary | ICD-10-CM | POA: Diagnosis not present

## 2024-01-02 DIAGNOSIS — D638 Anemia in other chronic diseases classified elsewhere: Secondary | ICD-10-CM | POA: Diagnosis not present

## 2024-01-04 ENCOUNTER — Other Ambulatory Visit: Payer: Self-pay | Admitting: Family Medicine

## 2024-01-05 ENCOUNTER — Other Ambulatory Visit: Payer: Self-pay

## 2024-01-05 ENCOUNTER — Ambulatory Visit (HOSPITAL_COMMUNITY): Payer: Self-pay | Admitting: Medical

## 2024-01-05 ENCOUNTER — Ambulatory Visit (HOSPITAL_COMMUNITY)
Admission: RE | Admit: 2024-01-05 | Discharge: 2024-01-05 | Disposition: A | Discharge status: Home / Self Care | Attending: Surgery | Admitting: Surgery

## 2024-01-05 ENCOUNTER — Encounter (HOSPITAL_COMMUNITY): Payer: Self-pay | Admitting: Surgery

## 2024-01-05 ENCOUNTER — Encounter (HOSPITAL_COMMUNITY)
Admission: RE | Disposition: A | Discharge status: Home / Self Care | Payer: Self-pay | Source: Home / Self Care | Attending: Surgery

## 2024-01-05 ENCOUNTER — Ambulatory Visit (HOSPITAL_COMMUNITY)

## 2024-01-05 DIAGNOSIS — K643 Fourth degree hemorrhoids: Secondary | ICD-10-CM | POA: Insufficient documentation

## 2024-01-05 DIAGNOSIS — Z860101 Personal history of adenomatous and serrated colon polyps: Secondary | ICD-10-CM | POA: Insufficient documentation

## 2024-01-05 DIAGNOSIS — N183 Chronic kidney disease, stage 3 unspecified: Secondary | ICD-10-CM | POA: Insufficient documentation

## 2024-01-05 DIAGNOSIS — Z01818 Encounter for other preprocedural examination: Secondary | ICD-10-CM

## 2024-01-05 DIAGNOSIS — I350 Nonrheumatic aortic (valve) stenosis: Secondary | ICD-10-CM | POA: Diagnosis not present

## 2024-01-05 DIAGNOSIS — K219 Gastro-esophageal reflux disease without esophagitis: Secondary | ICD-10-CM | POA: Insufficient documentation

## 2024-01-05 DIAGNOSIS — I129 Hypertensive chronic kidney disease with stage 1 through stage 4 chronic kidney disease, or unspecified chronic kidney disease: Secondary | ICD-10-CM | POA: Insufficient documentation

## 2024-01-05 DIAGNOSIS — E119 Type 2 diabetes mellitus without complications: Secondary | ICD-10-CM | POA: Diagnosis not present

## 2024-01-05 DIAGNOSIS — K5900 Constipation, unspecified: Secondary | ICD-10-CM | POA: Diagnosis not present

## 2024-01-05 DIAGNOSIS — Z79899 Other long term (current) drug therapy: Secondary | ICD-10-CM | POA: Diagnosis not present

## 2024-01-05 DIAGNOSIS — D631 Anemia in chronic kidney disease: Secondary | ICD-10-CM | POA: Diagnosis not present

## 2024-01-05 DIAGNOSIS — Z90710 Acquired absence of both cervix and uterus: Secondary | ICD-10-CM | POA: Insufficient documentation

## 2024-01-05 DIAGNOSIS — E039 Hypothyroidism, unspecified: Secondary | ICD-10-CM

## 2024-01-05 DIAGNOSIS — E1122 Type 2 diabetes mellitus with diabetic chronic kidney disease: Secondary | ICD-10-CM | POA: Diagnosis not present

## 2024-01-05 DIAGNOSIS — I10 Essential (primary) hypertension: Secondary | ICD-10-CM | POA: Diagnosis not present

## 2024-01-05 DIAGNOSIS — K644 Residual hemorrhoidal skin tags: Secondary | ICD-10-CM | POA: Insufficient documentation

## 2024-01-05 HISTORY — DX: Unspecified osteoarthritis, unspecified site: M19.90

## 2024-01-05 HISTORY — PX: RECTAL EXAM UNDER ANESTHESIA: SHX6399

## 2024-01-05 HISTORY — PX: HEMORRHOID SURGERY: SHX153

## 2024-01-05 LAB — GLUCOSE, CAPILLARY
Glucose-Capillary: 95 mg/dL (ref 70–99)
Glucose-Capillary: 152 mg/dL — ABNORMAL HIGH (ref 70–99)

## 2024-01-05 SURGERY — HEMORRHOIDECTOMY
Anesthesia: General | Site: Rectum

## 2024-01-05 MED ORDER — 0.9 % SODIUM CHLORIDE (POUR BTL) OPTIME
TOPICAL | Status: DC | PRN
  Administered 2024-01-05: 1000 mL

## 2024-01-05 MED ORDER — ACETAMINOPHEN 500 MG PO TABS
1000.0000 mg | ORAL_TABLET | Freq: Once | ORAL | Status: AC
  Administered 2024-01-05: 1000 mg via ORAL
  Filled 2024-01-05: qty 2

## 2024-01-05 MED ORDER — PROPOFOL 500 MG/50ML IV EMUL
INTRAVENOUS | Status: AC
  Filled 2024-01-05: qty 50

## 2024-01-05 MED ORDER — LIDOCAINE HCL (CARDIAC) PF 100 MG/5ML IV SOSY
PREFILLED_SYRINGE | INTRAVENOUS | Status: DC | PRN
  Administered 2024-01-05: 80 mg via INTRAVENOUS

## 2024-01-05 MED ORDER — BUPIVACAINE LIPOSOME 1.3 % IJ SUSP
INTRAMUSCULAR | Status: AC
  Filled 2024-01-05: qty 20

## 2024-01-05 MED ORDER — ACETAMINOPHEN 500 MG PO TABS: 1000.0000 mg | ORAL_TABLET | ORAL | Status: DC

## 2024-01-05 MED ORDER — OXYCODONE HCL 5 MG PO TABS: 5.0000 mg | ORAL_TABLET | Freq: Four times a day (QID) | ORAL | 0 refills | Status: DC | PRN

## 2024-01-05 MED ORDER — BUPIVACAINE-EPINEPHRINE (PF) 0.5% -1:200000 IJ SOLN
INTRAMUSCULAR | Status: AC
  Filled 2024-01-05: qty 30

## 2024-01-05 MED ORDER — PROPOFOL 1000 MG/100ML IV EMUL
INTRAVENOUS | Status: AC
  Filled 2024-01-05: qty 100

## 2024-01-05 MED ORDER — CHLORHEXIDINE GLUCONATE CLOTH 2 % EX PADS: 6.0000 | MEDICATED_PAD | Freq: Once | CUTANEOUS | Status: DC

## 2024-01-05 MED ORDER — PROPOFOL 10 MG/ML IV BOLUS
INTRAVENOUS | Status: DC | PRN
  Administered 2024-01-05: 80 ug/kg/min via INTRAVENOUS
  Administered 2024-01-05: 120 mg via INTRAVENOUS

## 2024-01-05 MED ORDER — DEXAMETHASONE SODIUM PHOSPHATE 10 MG/ML IJ SOLN
INTRAMUSCULAR | Status: AC
  Filled 2024-01-05: qty 1

## 2024-01-05 MED ORDER — ONDANSETRON HCL 4 MG/2ML IJ SOLN
INTRAMUSCULAR | Status: DC | PRN
  Administered 2024-01-05: 4 mg via INTRAVENOUS

## 2024-01-05 MED ORDER — GLYCOPYRROLATE 0.2 MG/ML IJ SOLN
INTRAMUSCULAR | Status: DC | PRN
  Administered 2024-01-05: .2 mg via INTRAVENOUS

## 2024-01-05 MED ORDER — EPHEDRINE 5 MG/ML INJ
INTRAVENOUS | Status: AC
  Filled 2024-01-05: qty 5

## 2024-01-05 MED ORDER — LIDOCAINE HCL (PF) 2 % IJ SOLN
INTRAMUSCULAR | Status: AC
  Filled 2024-01-05: qty 5

## 2024-01-05 MED ORDER — DIBUCAINE (PERIANAL) 1 % EX OINT
TOPICAL_OINTMENT | CUTANEOUS | Status: AC
  Filled 2024-01-05: qty 28

## 2024-01-05 MED ORDER — DEXAMETHASONE SODIUM PHOSPHATE 10 MG/ML IJ SOLN
INTRAMUSCULAR | Status: DC | PRN
  Administered 2024-01-05: 8 mg via INTRAVENOUS

## 2024-01-05 MED ORDER — GABAPENTIN 300 MG PO CAPS
300.0000 mg | ORAL_CAPSULE | ORAL | Status: AC
  Administered 2024-01-05: 300 mg via ORAL
  Filled 2024-01-05: qty 1

## 2024-01-05 MED ORDER — GLYCOPYRROLATE 0.2 MG/ML IJ SOLN
INTRAMUSCULAR | Status: AC
  Filled 2024-01-05: qty 1

## 2024-01-05 MED ORDER — FENTANYL CITRATE (PF) 100 MCG/2ML IJ SOLN
INTRAMUSCULAR | Status: AC
  Filled 2024-01-05: qty 2

## 2024-01-05 MED ORDER — BUPIVACAINE-EPINEPHRINE 0.5% -1:200000 IJ SOLN
INTRAMUSCULAR | Status: DC | PRN
  Administered 2024-01-05: 50 mL

## 2024-01-05 MED ORDER — PROPOFOL 10 MG/ML IV BOLUS
INTRAVENOUS | Status: AC
  Filled 2024-01-05: qty 20

## 2024-01-05 MED ORDER — ONDANSETRON HCL 4 MG/2ML IJ SOLN
INTRAMUSCULAR | Status: AC
  Filled 2024-01-05: qty 2

## 2024-01-05 MED ORDER — CHLORHEXIDINE GLUCONATE 0.12 % MT SOLN
15.0000 mL | Freq: Once | OROMUCOSAL | Status: AC
  Administered 2024-01-05: 15 mL via OROMUCOSAL

## 2024-01-05 MED ORDER — SUCCINYLCHOLINE CHLORIDE 200 MG/10ML IV SOSY
PREFILLED_SYRINGE | INTRAVENOUS | Status: DC | PRN
  Administered 2024-01-05: 100 mg via INTRAVENOUS

## 2024-01-05 MED ORDER — DIAZEPAM 5 MG PO TABS: 5.0000 mg | ORAL_TABLET | Freq: Three times a day (TID) | ORAL | 0 refills | Status: DC | PRN

## 2024-01-05 MED ORDER — PHENYLEPHRINE 80 MCG/ML (10ML) SYRINGE FOR IV PUSH (FOR BLOOD PRESSURE SUPPORT)
PREFILLED_SYRINGE | INTRAVENOUS | Status: AC
  Filled 2024-01-05: qty 10

## 2024-01-05 MED ORDER — PHENYLEPHRINE 80 MCG/ML (10ML) SYRINGE FOR IV PUSH (FOR BLOOD PRESSURE SUPPORT)
PREFILLED_SYRINGE | INTRAVENOUS | Status: DC | PRN
  Administered 2024-01-05 (×4): 160 ug via INTRAVENOUS

## 2024-01-05 MED ORDER — BUPIVACAINE LIPOSOME 1.3 % IJ SUSP: 20.0000 mL | Freq: Once | INTRAMUSCULAR | Status: DC

## 2024-01-05 MED ORDER — ORAL CARE MOUTH RINSE: 15.0000 mL | Freq: Once | OROMUCOSAL | Status: AC

## 2024-01-05 MED ORDER — FENTANYL CITRATE (PF) 100 MCG/2ML IJ SOLN
INTRAMUSCULAR | Status: DC | PRN
  Administered 2024-01-05 (×2): 50 ug via INTRAVENOUS

## 2024-01-05 MED ORDER — LACTATED RINGERS IV SOLN: INTRAVENOUS | Status: DC

## 2024-01-05 SURGICAL SUPPLY — 32 items
BAG COUNTER SPONGE SURGICOUNT (BAG) IMPLANT
BENZOIN TINCTURE PRP APPL 2/3 (GAUZE/BANDAGES/DRESSINGS) ×2 IMPLANT
BLADE SURG 15 STRL LF DISP TIS (BLADE) IMPLANT
BRIEF MESH DISP LRG (UNDERPADS AND DIAPERS) ×2 IMPLANT
CNTNR URN SCR LID CUP LEK RST (MISCELLANEOUS) ×2 IMPLANT
COVER SURGICAL LIGHT HANDLE (MISCELLANEOUS) ×2 IMPLANT
DRAPE LAPAROTOMY T 102X78X121 (DRAPES) ×2 IMPLANT
ELECT NDL BLADE 2-5/6 (NEEDLE) IMPLANT
ELECT NEEDLE BLADE 2-5/6 (NEEDLE) IMPLANT
ELECT REM PT RETURN 15FT ADLT (MISCELLANEOUS) ×2 IMPLANT
GAUZE 4X4 16PLY ~~LOC~~+RFID DBL (SPONGE) ×2 IMPLANT
GAUZE PAD ABD 8X10 STRL (GAUZE/BANDAGES/DRESSINGS) IMPLANT
GAUZE SPONGE 4X4 12PLY STRL (GAUZE/BANDAGES/DRESSINGS) IMPLANT
GLOVE ECLIPSE 8.0 STRL XLNG CF (GLOVE) ×2 IMPLANT
GLOVE INDICATOR 8.0 STRL GRN (GLOVE) ×2 IMPLANT
GOWN STRL REUS W/ TWL XL LVL3 (GOWN DISPOSABLE) ×6 IMPLANT
KIT BASIN OR (CUSTOM PROCEDURE TRAY) ×2 IMPLANT
KIT TURNOVER KIT A (KITS) ×2 IMPLANT
LOOP VESSEL MAXI BLUE (MISCELLANEOUS) IMPLANT
NDL HYPO 22X1.5 SAFETY MO (MISCELLANEOUS) ×2 IMPLANT
NEEDLE HYPO 22X1.5 SAFETY MO (MISCELLANEOUS) ×2 IMPLANT
PACK BASIC VI WITH GOWN DISP (CUSTOM PROCEDURE TRAY) ×2 IMPLANT
PENCIL BUTTON HOLSTER BLD 10FT (ELECTRODE) ×2 IMPLANT
SCRUB CHG 4% DYNA-HEX 4OZ (MISCELLANEOUS) ×2 IMPLANT
SPIKE FLUID TRANSFER (MISCELLANEOUS) ×2 IMPLANT
SURGILUBE 2OZ TUBE FLIPTOP (MISCELLANEOUS) ×2 IMPLANT
SUT CHROMIC 2 0 SH (SUTURE) ×2 IMPLANT
SUT VIC AB 2-0 UR6 27 (SUTURE) ×12 IMPLANT
SYR 20ML LL LF (SYRINGE) ×2 IMPLANT
SYR 3ML LL SCALE MARK (SYRINGE) IMPLANT
TOWEL OR 17X26 10 PK STRL BLUE (TOWEL DISPOSABLE) ×2 IMPLANT
YANKAUER SUCT BULB TIP 10FT TU (MISCELLANEOUS) ×2 IMPLANT

## 2024-01-05 NOTE — H&P (Signed)
 01/05/2024    REFERRING PHYSICIAN: Self  Patient Care Team: Antonetta Rollene BRAVO, MD as PCP - General (Family Medicine) Ahmed, Deatrice FALCON, MD (Gastroenterology) Delford Maude BROCKS, MD (Cardiovascular Disease) Sheldon, Elspeth Bitter, MD as Consulting Provider (General Surgery)  PROVIDER: ELSPETH BITTER SHELDON, MD  [facility] MRN: [id number] DOB: [birth year]  SUBJECTIVE   Chief Complaint: Follow-up (anal lesion, discus scheduling sx/)   Renee Collins is a 83 y.o. female  who is seen today as an office consultation  at the request of Dr.. Self  for evaluation of anal prolapsing mass   History of Present Illness:  Patient comes in by herself. She is gone back from vacation. She wished to consider hemorrhoid surgery. She had seen her gastrologist and walked and wondering if she needed another referral. Office appointment offered. She notes no new events. Still struggling with some bleeding and discomfort and feels the hemorrhoids on the outside. No bad bouts of constipation or diarrhea. Ready to consider surgery. Had questions about technique. Looking at our records, it looks like our schedulers tried to reach her without success.  PRIOR VISIT 07/11/2023 83 year old woman. History of colon polyps. Last done in 2019 so 5-year follow-up done last month underwent colonoscopy up in [place] by Dr. Cinderella 05/2023. Tubular adenoma removed from ascending colon. Prolapsing rectal tissue/ mass noted. Concerning. Surgical consultation requested. Patient notes that she felt some tissue on the outside for the past year or so. She always thought was just hemorrhoids. Not have any pain with bowel movements. She wears a pad for moisture. No incontinence to flatus or stools. No major urinary incontinence.  Patient has a history of mild aortic stenosis echocardiogram 2023. Followed by Dr. Delford. She had an abdominal hysterectomy but no other abdominal surgeries. She claims she can walk half hour without difficulty. Does  not smoke. No diabetes. Had some back and neck surgery. Claims she moves her bowels every day or so. On stool softeners to correct some constipation. She is never had any anorectal interventions.  He lives with her son.  Medical History:  Past Medical History:  Diagnosis Date  Anemia  Arthritis  Chronic kidney disease  GERD (gastroesophageal reflux disease)  Hyperlipidemia  Hypertension  Thyroid  disease   Patient Active Problem List  Diagnosis  Prolapsed internal hemorrhoids, grade 4  History of adenomatous polyp of colon   Past Surgical History:  Procedure Laterality Date  Laminectomy  Neck Surgery    No Known Allergies  Current Outpatient Medications on File Prior to Visit  Medication Sig Dispense Refill  acetaminophen  (TYLENOL ) 650 MG ER tablet Take 650 mg by mouth every 8 (eight) hours as needed  amLODIPine  (NORVASC ) 10 MG tablet Take 10 mg by mouth once daily  aspirin 81 MG EC tablet Take 81 mg by mouth once daily  azelastine  (ASTELIN ) 137 mcg nasal spray Place 2 sprays into both nostrils 2 (two) times daily USE IN EACH NOSTRIL AS DIRECTED  benazepriL  (LOTENSIN ) 5 MG tablet Take 5 mg by mouth once daily  buPROPion  (WELLBUTRIN  XL) 150 MG XL tablet Take 1 tablet by mouth once daily  calcitRIOL (ROCALTROL) 0.25 MCG capsule TAKE ONE CAPSULE BY MOUTH MONDAY, WEDNESDAY, AND FRIDAY  calcium carbonate-vitamin D3 (CALTRATE 600+D) 600 mg-5 mcg (200 unit) tablet Take 1 tablet by mouth 2 (two) times daily with meals  diphenhydrAMINE (BENADRYL) 25 mg capsule Take 25 mg by mouth  hydroCHLOROthiazide  (HYDRODIURIL ) 25 MG tablet Take 1 tablet by mouth once daily  levothyroxine  (SYNTHROID ) 88 MCG tablet TAKE  1 TABLET BY MOUTH ONCE DAILY. 1/2 HOUR BEFORE FIRST MEAL OF THE DAY. TAKE WITH WATER .  multivitamin tablet Take 1 tablet by mouth once daily  omeprazole  (PRILOSEC) 20 MG DR capsule Take 20 mg by mouth  pravastatin  (PRAVACHOL ) 20 MG tablet Take 20 mg by mouth every morning   No  current facility-administered medications on file prior to visit.   Family History  Problem Relation Age of Onset  Diabetes Mother  Breast cancer Sister  Diabetes Sister    Social History   Tobacco Use  Smoking Status Never  Smokeless Tobacco Never    Social History   Socioeconomic History  Marital status: Divorced  Tobacco Use  Smoking status: Never  Smokeless tobacco: Never  Vaping Use  Vaping status: Never Used  Substance and Sexual Activity  Alcohol use: Never  Drug use: Never   Social Drivers of Corporate investment banker Strain: Low Risk (09/06/2023)  [facility]  Overall Financial Resource Strain (CARDIA)  Difficulty of Paying Living Expenses: Not hard at all  Food Insecurity: No Food Insecurity (09/06/2023)  Received from [facility]  Hunger Vital Sign  Within the past 12 months, you worried that your food would run out before you got the money to buy more.: Never true  Within the past 12 months, the food you bought just didn't last and you didn't have money to get more.: Never true  Transportation Needs: No Transportation Needs (09/06/2023)  Received from [facility] - [facility]  Lack of Transportation (Medical): No  Lack of Transportation (Non-Medical): No  Physical Activity: Sufficiently Active (09/06/2023)  Received from [facility]  Exercise Vital Sign  On average, how many days per week do you engage in moderate to strenuous exercise (like a brisk walk)?: 7 days  On average, how many minutes do you engage in exercise at this level?: 30 min  Stress: No Stress Concern Present (09/06/2023)  Received from [facility] - Occupational Stress Questionnaire  Feeling of Stress : Not at all  Social Connections: Moderately Integrated (09/06/2023)  Received from [facility]  Social Connection and Isolation Panel  In a typical week, how many times do you talk on the phone with family, friends, or  neighbors?: More than three times a week  How often do you get together with friends or relatives?: More than three times a week  How often do you attend church or religious services?: More than 4 times per year  Do you belong to any clubs or organizations such as church groups, unions, fraternal or athletic groups, or school groups?: Yes  How often do you attend meetings of the clubs or organizations you belong to?: More than 4 times per year  Are you married, widowed, divorced, separated, never married, or living with a partner?: Divorced  Housing Stability: Unknown (07/11/2023)  Housing Stability Vital Sign  Homeless in the Last Year: No   ############################################################  Review of Systems: A complete review of systems (ROS) was obtained from the patient.  We have reviewed this information and discussed as appropriate with the patient.  See HPI as well for other pertinent ROS.  Constitutional: No fevers, chills, sweats. Weight stable Eyes: No vision changes, No discharge HENT: No sore throats, nasal drainage Lymph: No neck swelling, No bruising easily Pulmonary: No cough, productive sputum CV: No orthopnea, PND . No exertional chest/neck/shoulder/arm pain. Patient can walk 30 minutes without difficulty.   GI: No personal nor  family history of GI/colon cancer, inflammatory bowel disease, irritable bowel syndrome, allergy such as Celiac Sprue, dietary/dairy problems, colitis, ulcers nor gastritis. No recent sick contacts/gastroenteritis. No travel outside the country. No changes in diet.  Renal: No UTIs, No hematuria Genital: No drainage, bleeding, masses Musculoskeletal: No severe joint pain. Good ROM major joints Skin: No sores or lesions Heme/Lymph: No easy bleeding. No swollen lymph nodes Neuro: No active seizures. No facial droop Psych: No hallucinations. No agitation  OBJECTIVE   Vitals:  12/19/23 1114 12/19/23 1115  BP: (!) 154/77  Pulse: 104   Temp: 36.7 C (98 F)  SpO2: 98%  Weight: 81.5 kg (179 lb 9.6 oz)  Height: 167.6 cm (5' 6)  PainSc: 0-No pain  PainLoc: Rectum   Body mass index is 28.99 kg/m.  PHYSICAL EXAM:  Constitutional: Not cachectic. Hygeine adequate. Vitals signs as above.  Eyes: No glasses. Vision adequate,Pupils reactive, normal extraocular movements. Sclera nonicteric Neuro: CN II-XII intact. No major focal sensory defects. No major motor deficits. Lymph: No head/neck/groin lymphadenopathy Psych: No severe agitation. No severe anxiety. Judgment & insight Adequate, Oriented x4, HENT: Normocephalic, Mucus membranes moist. No thrush. Hearing: adequate Neck: Supple, No tracheal deviation. No obvious thyromegaly Chest: No pain to chest wall compression. Good respiratory excursion. No audible wheezing CV: Pulses intact. regular. No major extremity edema Ext: No obvious deformity or contracture. Edema: Not present. No cyanosis Skin: No major subcutaneous nodules. Warm and dry Musculoskeletal: Severe joint rigidity not present. No obvious clubbing. No digital petechiae. Mobility: no assist device moving easily without restrictions  Abdomen: Flat Soft. Nondistended. Nontender. Hernia: Not present. Diastasis recti: Not present. No hepatomegaly. No splenomegaly.  Rectal: Deferred given no major change in history from prior exam January 2025  Prior exam Jan 2025:  Rectal:  Perianal skin Mild fecal soiling  Pruritis ani: Mild Pilonidal disease: Not present Condyloma / warts: Not present  Anal fissure: Not present Perirectal abscess/fistula Not present External hemorrhoids giant prolapsed hemorrhoids grade 4 - all three piles Somewhat flat in an arc. Some changes to it but more likely consistent with chronic irritation and not really adenomatous in themselves. Not true procidentia prolapsing but significant prolapsed tissue. External tissue is well  Digital and anoscopic rectal exam Tolerated  Sphincter tone  Normal  Hemorrhoidal piles enlarged right sided internal hemorrhoids as well but the Prostate: N/A Rectovaginal septum: Intact with normal thickness & no rectocele Rectal masses: Not present  Other significant findings: N/A  Patient examined by myself with patient in decubitus position  ###################################    ###################################################################  Labs, Imaging and Diagnostic Testing:  Located in 'Care Everywhere' section of Epic EMR chart  PRIOR [facility] CLINIC NOTES:  Not applicable  SURGERY NOTES:  Not applicable  PATHOLOGY:  Located in 'Care Everywhere' section of Epic EMR chart  Assessment and Plan:  DIAGNOSES:  Diagnoses and all orders for this visit:  Prolapsed internal hemorrhoids, grade 4  History of adenomatous polyp of colon    ASSESSMENT/PLAN  Pleasant elderly woman with significant prolapsing rectal tissue that seems like a chronic grade 4 hemorrhoids of all three piles. There is a possibility of adenomatous tissue, but I think it is secondary irritation.  I again recommended anorectal examination under anesthesia with hemorrhoidal ligation & pexy and hemorrhoidectomy of remaining tissue. I suspect I am going to have to trim down 3 piles. Gives a chance to make sure that the atypical hemorrhoids are not otherwise concerning. Send to pathology to rule out any adenomatous changes  The anatomy & physiology of the anorectal region was discussed. The pathophysiology of hemorrhoids and differential diagnosis was discussed. Natural history risks without surgery was discussed. I stressed the importance of a bowel regimen to have daily soft bowel movements to minimize progression of disease. Interventions such as sclerotherapy & banding were discussed.  The patient's symptoms are not adequately controlled by medicines and other non-operative treatments. I feel the risks & problems of no surgery outweigh the operative  risks; therefore, I recommended surgery to treat the hemorrhoids by ligation, pexy, and possible resection.  Risks such as bleeding, infection, urinary difficulties, injury to other organs, need for repair of tissues / organs, need for further treatment, heart attack, death, and other risks were discussed. I noted a good likelihood this will help address the problem. Goals of post-operative recovery were discussed as well. Possibility that this will not correct all symptoms was explained. Post-operative pain, bleeding, constipation, and other problems after surgery were discussed. We will work to minimize complications. Educational handouts further explaining the pathology, treatment options, and bowel regimen were given as well. Questions were answered. The patient expresses understanding & wishes to proceed with surgery. We will try and see if we can honor her request to just schedule today   Elspeth KYM Schultze, MD, FACS, MASCRS Esophageal, Gastrointestinal & Colorectal Surgery Robotic and Minimally Invasive Surgery  [facility] [address] [place], [place] [address] [phone] Fax [phone] Main  CONTACT INFORMATION: Weekday (9AM-5PM): Call [facility] main office at [phone] Weeknight (5PM-9AM) or Weekend/Holiday: Check EPIC Web Links tab & use AMION (password  [email or web address]) for General Surgery [facility] coverage  Please, DO NOT use SecureChat  (it is not reliable communication to reach operating surgeons & will lead to a delay in care).   Epic staff messaging available for outptient concerns needing 1-2 business day response.       01/05/2024

## 2024-01-05 NOTE — Discharge Instructions (Signed)
 ##############################################  ANORECTAL SURGERY:  POST OPERATIVE INSTRUCTIONS  ######################################################################  EAT Start with a pureed / full liquid diet After 24 hours, gradually transition to a high fiber diet.    CONTROL PAIN Control pain so you can tolerate bowel movements,  walk, sleep, tolerate sneezing/coughing, and go up/down stairs.   HAVE A BOWEL MOVEMENT DAILY Keep your bowels regular to avoid problems.   Taking a fiber supplement 2-4 doses every day to keep bowels soft.   HAVE A BM BY THE SECOND POSTOPERATIVE DAY Use an antidairrheal to slow down diarrhea.   Call if not better after 2 tries  WALK Walk an hour a day.  Control your pain to do that.   CALL IF YOU HAVE PROBLEMS/CONCERNS Call if you are still struggling despite following these instructions. Call if you have concerns not answered by these instructions  ######################################################################    Take your usually prescribed home medications unless otherwise directed. Blood thinners:  You can restart any strong blood thinners after the second postoperative day  for example: COUMADIN (warfarin), XERELTO (rivaroxaban), ELIQUIS (apixaban), PLAVIX (clopidigrel), BRILINTA (ticagrelor), EFFIENT (prasugrel), PRADAXA (dabigatran), etc  Continue aspirin before & after surgery..     Some oozing/bleeding the first 1-2 weeks is common but should taper down & be small volume.    If you are passing many large clots or having uncontrolling bleeding, call your surgeon  DIET: Follow a light bland diet & liquids the first 24 hours after arrival home, such as soup, liquids, starches, etc.  Be sure to drink plenty of fluids.  Quickly advance to a usual solid diet within a few days.  Avoid fast food or heavy meals as your are more likely to get nauseated or have irregular bowels.  A low-fat, high-fiber diet for the rest of your life  is ideal.  Control pain to avoid problems with urinating or defecating:  Pain is best controlled by a usual combination of many methods TOGETHER: Warm baths/soaks or Ice packs Over the counter pain medication Prescription pain medications Topical creams  A.  Warm water baths or ice packs (30-60 minutes up to 8 times a day, especially after bowel meovements) will help.  Using ice for the first few days can help decrease swelling and bruising,  Use heat such as warm towels, sitz baths, warm baths, warm showers, etc to help relax tight/sore spots and speed recovery.   Some people prefer to use ice alone, heat alone, alternating between ice & heat.  Experiment to what works for you.    It is helpful to take an over-the-counter pain medication continuously for the first few weeks.  This helps people need to use less narcotics Choose one of the following that works best for you: Naproxen (Aleve, etc)  Two 220mg  tabs twice a day Ibuprofen (Advil, etc) Three 200mg  tabs four times a day (every meal & bedtime) Acetaminophen (Tylenol, etc) 500-650mg  four times a day (every meal & bedtime)  A  prescription for pain medication (such as oxycodone, hydrocodone, etc) should be given to you upon discharge.  Take your pain medication as prescribed.  If you are having problems/concerns with the prescription medicine (does not control pain, nausea, vomiting, rash, itching, etc), please call us [phone] to see if we need to switch you to a different pain medicine that will work better for you and/or control your side effect better. Most people will need 1-2 refills to get through the first couple of weeks when pain is the most  intense.  If you need a refill on your pain medication, please contact your pharmacy.  They will contact our office to request authorization. Prescriptions will not be filled after 5 pm or on week-ends.  If can take up to 48 hours for it to be filled & ready so avoid waiting until you  are down to thel ast pill.  A numbing topical cream (Dibucaine) may be given to you.  Many people find relief with topical creams.  Some people find it burns too much.  Experiment.  If it helps, use it.  If it burns, stop using it.  You also may receive a prescription for diazepam, a muscle relaxant to help you to be able to urinate at first easily.  It is safe to take a few doses with the other medications as long as you are not planning to drive or do anything intense.  Hopefully this can minimize the chance of needing a Foley catheter into your bladder     Have a bowel movement every day  Until you have a bowel movement after surgery, he will struggle with urinating and controlling her pain.  Take a fiber supplement (such as Metamucil, Citrucel, FiberCon, MiraLax, etc) 2-4 doses a day to help prevent constipation from occurring.     If you have not had a bowel movement the second postoperative day:  -switch to drinking liquids only -take MiraLAX double dose every 2 hours x 3 or until you have a BM -If that does not work x 3 doses, increase to 4 doses every 2 hours (Like a small bowel prep) until you have a bowel movement. -Avoid enemas or suppositories (can harm sutures/repair) -Once you start having bowel movements, adjust your fiber supplement or Miralax dosing back down until you are having 1-2 soft well formed BMs a day.  (Start at 2 doses a day & adjust up or down to avoid diarrhea or recurrent constipation)   Watch out for diarrhea.  If you have many loose bowel movements, simplify your diet to bland foods & liquids for a few days.  Stop any stool softeners and decrease your fiber supplement.  Switching to mild anti-diarrheal medications (Kayopectate, Pepto Bismol) can help.  Can try an imodium/loperamide dose.  If this worsens or does not improve, please call us.  Wound Care   a. You have some fluffed cotton on top of the anus to help catch drainage and bleeding.  THERE IS NO  PACKING INSIDE THE RECTUM -Let the cotton fall off with the first bowel movement or shower.  It is okay to reinforce or replace as needed.  Bleeding is common at first and gradually slows down after a few weeks   b. Place soft cotton balls on the anus/wounds and use an absorbent pad in your underwear as needed to catch any drainage and help keep the area.  Try to use cotton balls or pads (regular gauze or toilet paper will stick and pull, causing pain.  Cotton will come off more easily).  OK to try dry powders such as cornstarch or baby powders   c. Keep the area clean and dry.  Bathe / shower every day.  Keep the area clean by showering / bathing over the incision / wound.   It is okay to soak an open wound to help wash it.  Consider using a squeeze bottle filled with warm water to gently wash the anal area.  Wet wipes or showers / gentle washing after bowel movements  is often less traumatic than regular toilet paper.           D.  Use a Sitz Bath 4-8 times a day for relief  A sitz bath is a warm water bath taken in the sitting position that covers only the hips and buttocks. It may be used for either healing or hygiene purposes. Sitz baths are also used to relieve pain, itching, or muscle spasms.  Gently cleaned the area and the heat will help lower spasm and offer better pain control.    Fill the bathtub half full with warm water. Sit in the water and open the drain a little. Turn on the warm water to keep the tub half full. Keep the water running constantly. Soak in the water for 15 to 20 minutes. After the sitz bath, pat the affected area dry first.   d. You will often notice bleeding, especially with bowel movements.  This should slow down by the end of the first week of surgery.  It can take over a month to more fully stop.  Sitting on an ice pack can help.   e. Expect some drainage.  You often will have some blood or yellow drainage with open wounds.  Sometimes she will get a little leaking  of liquid stool until the incision/wounds have fully close down.  This should slow down by the end of the first week of surgery, but you will have occasional bleeding or drainage up to a few months after surgery until all incisions & wounds have closed.  Wear an absorbent pad or soft cotton gauze in your underwear until the drainage stops.  ACTIVITIES as tolerated:    You may resume regular (light) daily activities beginning the next day--such as daily self-care, walking, climbing stairs--gradually increasing activities as tolerated.  If you can walk 30 minutes without difficulty, it is safe to try more intense activity such as jogging, treadmill, bicycling, low-impact aerobics, swimming, etc. Save the most intensive and strenuous activity for last such as sit-ups, heavy lifting, contact sports, etc  Refrain from any heavy lifting or straining until you are off narcotics for pain control.   DO NOT PUSH THROUGH PAIN.  Let pain be your guide: If it hurts to do something, don't do it.  Pain is your body warning you to avoid that activity for another week until the pain goes down. You may drive when you are no longer taking prescription pain medication, you can comfortably sit for long periods of time, and you can safely maneuver your car and apply brakes. You may have sexual intercourse when it is comfortable.   6.  FOLLOW UP in our office Please call [facility] at [phone] to set up an appointment to see your surgeon in the office for a follow-up appointment approximately 3 weeks after your surgery. If tissue is sent for pathology, it usually takes a week for pathology results to come back.  We will make an effort to call you with results as soon as we get them.  If you have not heard back in a week and wish to know the results before your postop visit, please call our office and we will see if we can give feedback on the results Make sure that you call for this appointment the day you arrive home to  ensure a convenient appointment time.  7. IF YOU HAVE DISABILITY OR FAMILY LEAVE FORMS, BRING THEM TO THE OFFICE FOR PROCESSING.  DO NOT GIVE THEM TO YOUR  DOCTOR.        WHEN TO CALL us [phone]: Poor pain control Reactions / problems with new medications (rash/itching, nausea, etc)  Fever over 101.5 F (38.5 C) Inability to urinate Nausea and/or vomiting Worsening swelling or bruising Continued bleeding from incision. Increased pain, redness, or drainage from the incision  The clinic staff is available to answer your questions during regular business hours (8:30am-5pm).  Please don't hesitate to call and ask to speak to one of our nurses for clinical concerns.   A surgeon from [facility] is always on call at the hospitals   If you have a medical emergency, go to the nearest emergency room or call 911.    [facility] [address], [place], [place]  [address] ? MAIN: [phone] ? TOLL FREE: [phone] ? FAX [phone] [email or web address]  #####################################################

## 2024-01-05 NOTE — Transfer of Care (Signed)
 Immediate Anesthesia Transfer of Care Note  Patient: Renee Collins  Procedure(s) Performed: HEMORRHOIDECTOMY (Rectum) EXAM UNDER ANESTHESIA, RECTUM  Patient Location: PACU  Anesthesia Type:General  Level of Consciousness: sedated, patient cooperative, and responds to stimulation  Airway & Oxygen Therapy: Patient Spontanous Breathing and Patient connected to face mask oxygen  Post-op Assessment: Report given to RN and Post -op Vital signs reviewed and stable  Post vital signs: Reviewed and stable  Last Vitals:  Vitals Value Taken Time  BP 116/68 01/05/24 15:12  Temp 97   Pulse 95 01/05/24 15:13  Resp 17 01/05/24 15:13  SpO2 93 % 01/05/24 15:13  Vitals shown include unfiled device data.  Last Pain:  Vitals:   01/05/24 1212  PainSc: 0-No pain         Complications: No notable events documented.

## 2024-01-05 NOTE — Anesthesia Postprocedure Evaluation (Signed)
 Anesthesia Post Note  Patient: Renee Collins  Procedure(s) Performed: HEMORRHOIDECTOMY (Rectum) EXAM UNDER ANESTHESIA, RECTUM     Patient location during evaluation: PACU Anesthesia Type: General Level of consciousness: awake and alert Pain management: pain level controlled Vital Signs Assessment: post-procedure vital signs reviewed and stable Respiratory status: spontaneous breathing, nonlabored ventilation, respiratory function stable and patient connected to nasal cannula oxygen Cardiovascular status: blood pressure returned to baseline and stable Postop Assessment: no apparent nausea or vomiting Anesthetic complications: no   No notable events documented.  Last Vitals:  Vitals:   01/05/24 1600 01/05/24 1611  BP: 122/65 131/86  Pulse: 85 82  Resp: 15 19  Temp:    SpO2: 96% 94%    Last Pain:  Vitals:   01/05/24 1611  PainSc: 0-No pain                 Renee Collins

## 2024-01-05 NOTE — Anesthesia Procedure Notes (Signed)
 Procedure Name: Intubation Date/Time: 01/05/2024 1:30 PM  Performed by: Nada Corean CROME, CRNAPre-anesthesia Checklist: Suction available, Emergency Drugs available, Patient identified, Patient being monitored and Timeout performed Patient Re-evaluated:Patient Re-evaluated prior to induction Oxygen Delivery Method: Circle system utilized Preoxygenation: Pre-oxygenation with 100% oxygen Induction Type: IV induction Ventilation: Mask ventilation without difficulty Laryngoscope Size: Mac and 3 Grade View: Grade I Tube type: Oral Tube size: 7.0 mm Number of attempts: 1 Airway Equipment and Method: Stylet Placement Confirmation: ETT inserted through vocal cords under direct vision, positive ETCO2 and breath sounds checked- equal and bilateral Secured at: 22 cm Tube secured with: Tape Dental Injury: Teeth and Oropharynx as per pre-operative assessment  Comments: Intubation performed by paramedic student with MD at beside.

## 2024-01-05 NOTE — Interval H&P Note (Signed)
 History and Physical Interval Note:  01/05/2024 12:26 PM  Renee Collins  has presented today for surgery, with the diagnosis of HEMORRHOIDS EXTERNAL AND GRADY 4 PROLAPSED BLEEDING.  The various methods of treatment have been discussed with the patient and family. After consideration of risks, benefits and other options for treatment, the patient has consented to  Procedure(s) with comments: HEMORRHOIDECTOMY (N/A) - HEMORRHOIDECTOMY WITH LIGATION & HEMORRHOIDOPEXY EXAM UNDER ANESTHESIA, RECTUM (N/A) as a surgical intervention.  The patient's history has been reviewed, patient examined, no change in status, stable for surgery.  I have reviewed the patient's chart and labs.  Questions were answered to the patient's satisfaction.    I have re-reviewed the the patient's records, history, medications, and allergies.  I have re-examined the patient.  I again discussed intraoperative plans and goals of post-operative recovery.  The patient agrees to proceed.  Renee Collins  [birth year] [id number]  Patient Care Team: Antonetta Rollene BRAVO, MD as PCP - General (Family Medicine) Delford Maude BROCKS, MD as PCP - Cardiology (Cardiology) Antonetta Rollene BRAVO, MD Sheldon Standing, MD as Consulting Physician (General Surgery) Ahmed, Deatrice FALCON, MD as Consulting Physician (Gastroenterology) Lamon Pleasant CHRISTELLA DEVONNA as Physician Assistant (Oncology) Darroll Anes, DO (Optometry) Gaynel Delon CROME, DPM as Consulting Physician (Podiatry)  Patient Active Problem List   Diagnosis Date Noted   Encounter for Medicare annual examination with abnormal findings 11/03/2023   Normocytic anemia 07/22/2023   Anal lesion 07/22/2023   Adenomatous polyp of ascending colon 06/14/2023   Encounter for immunization 04/18/2023   Unintentional weight loss 04/18/2023   Chronic left hip pain 12/10/2022   Encounter for annual physical exam 07/14/2022   Hypothyroid 11/25/2021   Aortic stenosis, mild 07/24/2021   Abnormal EKG  07/24/2021   Arm pain, lateral, right 04/22/2021   Cervical spine arthritis with nerve pain 04/22/2021   Murmur, cardiac 07/08/2020   Depression, major, single episode, 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   Early satiety 11/27/2019   Sciatica of right side 03/28/2018   Overweight (BMI 25.0-29.9) 11/25/2017   Monoclonal gammopathy 08/13/2013   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   Stage 3 chronic kidney disease (HCC) 04/09/2013   Cataracts, bilateral 04/09/2013   Seasonal allergies 02/16/2011   Type 2 diabetes mellitus with other specified complication (HCC) 01/28/2011   THYROID  NODULE 01/05/2010   Unspecified vitamin D  deficiency 01/05/2010   Acute bronchitis 10/14/2009   KNEE, ARTHRITIS, DEGEN./OSTEO 05/01/2009   Dyslipidemia 11/11/2008   Anemia in chronic kidney disease 11/11/2008   Essential hypertension 06/14/2008    Past Medical History:  Diagnosis Date   Anemia    Arthritis    Chronic back pain    Chronic kidney disease    Depression    Diabetes mellitus without complication (HCC)    GERD (gastroesophageal reflux disease)    Heart murmur    Hyperlipidemia    Hypertension    Hypothyroidism     Past Surgical History:  Procedure Laterality Date   ANTERIOR CERVICAL DECOMP/DISCECTOMY FUSION N/A 09/15/2021   Procedure: Anterior Cervical Decompression Discectomy Fusion Cervical three-four, Cervical four-five;  Surgeon: Debby Dorn MATSU, MD;  Location: [facility] OR;  Service: Neurosurgery;  Laterality: N/A;   BONE MARROW ASPIRATION Left 05/16/15   BONE MARROW BIOPSY Left 05/16/15   CATARACT EXTRACTION W/PHACO Right 02/21/2023   Procedure: CATARACT EXTRACTION PHACO AND INTRAOCULAR LENS PLACEMENT (IOC);  Surgeon: Harrie Agent, MD;  Location: [facility];  Service: Ophthalmology;  Laterality: Right;  CDE: 7.42  CATARACT EXTRACTION W/PHACO Left 03/07/2023   Procedure: CATARACT EXTRACTION PHACO AND INTRAOCULAR LENS PLACEMENT (IOC);  Surgeon: Harrie Agent, MD;  Location: [facility];  Service: Ophthalmology;  Laterality: Left;  CDE: 8.10   COLONOSCOPY  03/30/2012   Procedure: COLONOSCOPY;  Surgeon: Claudis RAYMOND Rivet, MD;  Location: [facility];  Service: Endoscopy;  Laterality: N/A;  730   COLONOSCOPY N/A 06/30/2017   Procedure: COLONOSCOPY;  Surgeon: Rivet Claudis RAYMOND, MD;  Location: [facility];  Service: Endoscopy;  Laterality: N/A;  2:00   COLONOSCOPY WITH PROPOFOL  N/A 06/14/2023   Procedure: COLONOSCOPY WITH PROPOFOL ;  Surgeon: Cinderella Deatrice FALCON, MD;  Location: [facility];  Service: Endoscopy;  Laterality: N/A;   LAMINECTOMY  1980's   POLYPECTOMY  06/30/2017   Procedure: POLYPECTOMY;  Surgeon: Rivet Claudis RAYMOND, MD;  Location: [facility];  Service: Endoscopy;;  colon   POLYPECTOMY  06/14/2023   Procedure: POLYPECTOMY;  Surgeon: Cinderella Deatrice FALCON, MD;  Location: [facility];  Service: Endoscopy;;   SPINE SURGERY  approx 1983   dr leeann   TOTAL ABDOMINAL HYSTERECTOMY  approx 1993   fibroids     Social History   Socioeconomic History   Marital status: Divorced    Spouse name: Not on file   Number of children: 3   Years of education: Not on file   Highest education level: Not on file  Occupational History   Occupation: LPN  Tobacco Use   Smoking status: Never   Smokeless tobacco: Never  Vaping Use   Vaping status: Never Used  Substance and Sexual Activity   Alcohol use: No   Drug use: No   Sexual activity: Not Currently  Other Topics Concern   Not on file  Social History Narrative   Pt has 2 living adult children, 1 desease at age 70 secondary congential heart disease.   7 grandchildren.   Social Drivers of Corporate investment banker Strain: Low Risk  (09/06/2023)   Overall Financial Resource Strain (CARDIA)    Difficulty of Paying Living Expenses: Not hard at all  Food Insecurity: No Food Insecurity (09/06/2023)   Hunger Vital Sign    Worried About Running Out of Food in the Last Year: Never true    Ran Out of  Food in the Last Year: Never true  Transportation Needs: No Transportation Needs (09/06/2023)   PRAPARE - Administrator, Civil Service (Medical): No    Lack of Transportation (Non-Medical): No  Physical Activity: Sufficiently Active (09/06/2023)   Exercise Vital Sign    Days of Exercise per Week: 7 days    Minutes of Exercise per Session: 30 min  Stress: No Stress Concern Present (09/06/2023)   [employer] of Occupational Health - Occupational Stress Questionnaire    Feeling of Stress : Not at all  Social Connections: Moderately Integrated (09/06/2023)   Social Connection and Isolation Panel    Frequency of Communication with Friends and Family: More than three times a week    Frequency of Social Gatherings with Friends and Family: More than three times a week    Attends Religious Services: More than 4 times per year    Active Member of [employer] or Organizations: Yes    Attends Banker Meetings: More than 4 times per year    Marital Status: Divorced  Intimate Partner Violence: Not At Risk (09/06/2023)   Humiliation, Afraid, Rape, and Kick questionnaire    Fear of Current or Ex-Partner: No  Emotionally Abused: No    Physically Abused: No    Sexually Abused: No    Family History  Problem Relation Age of Onset   Heart attack Mother 68   Heart disease Mother    Cancer Mother        type unknown   Diabetes Mother    Kidney failure Father    Kidney disease Father    Myasthenia gravis Brother    Diabetes Sister    Breast cancer Sister 50   Heart disease Brother    Diabetes Sister    Sickle cell anemia Sister    Cancer Sister        unknown type    Medications Prior to Admission  Medication Sig Dispense Refill Last Dose/Taking   acetaminophen  (TYLENOL ) 650 MG CR tablet Take 650 mg by mouth every 8 (eight) hours as needed for pain.    01/04/2024   amLODipine  (NORVASC ) 10 MG tablet TAKE 1 TABLET BY MOUTH DAILY. 100 tablet 2 01/05/2024 at  8:00 AM    aspirin 81 MG EC tablet Take 81 mg by mouth daily. 30 tablet 12 01/04/2024   benazepril  (LOTENSIN ) 5 MG tablet Take 1 tablet (5 mg total) by mouth daily. 30 tablet 2 01/04/2024   buPROPion  (WELLBUTRIN  XL) 150 MG 24 hr tablet Take 1 tablet (150 mg total) by mouth daily. 90 tablet 3 01/05/2024 at  8:00 AM   calcitRIOL (ROCALTROL) 0.25 MCG capsule Take 0.25 mcg by mouth every Monday, Wednesday, and Friday.   01/04/2024   Calcium-Magnesium-Vitamin D  (CALCIUM 1200+D3 PO) Take 1 tablet by mouth 2 (two) times daily.   01/04/2024   diphenhydrAMINE (BENADRYL) 25 mg capsule Take 25 mg by mouth at bedtime as needed for allergies or sleep.   01/04/2024   hydrochlorothiazide  (HYDRODIURIL ) 25 MG tablet TAKE 1 TABLET BY MOUTH DAILY. 100 tablet 2 01/04/2024   levothyroxine  (SYNTHROID ) 88 MCG tablet TAKE 1 TABLET BY MOUTH ONCE DAILY. 1/2 HOUR BEFORE FIRST MEAL OF THE DAY. TAKE WITH WATER . 100 tablet 2 01/05/2024 at  8:00 AM   Misc Natural Products (JOINT HEALTH PO) Take 1 capsule by mouth daily.   Taking   Multiple Vitamin (MULTIVITAMIN WITH MINERALS) TABS tablet Take 1 tablet by mouth daily.   01/04/2024   omeprazole  (PRILOSEC) 20 MG capsule Take 1 capsule (20 mg total) by mouth daily as needed (for acid reflux.). 90 capsule 1 01/05/2024 at  8:00 AM   pravastatin  (PRAVACHOL ) 20 MG tablet TAKE ONE TABLET BY MOUTH IN THE EVENING. 100 tablet 2 01/04/2024   azelastine  (ASTELIN ) 0.1 % nasal spray Place 2 sprays into both nostrils 2 (two) times daily. Use in each nostril as directed (Patient not taking: Reported on 12/27/2023) 30 mL 1 Not Taking   benzonatate  (TESSALON ) 200 MG capsule Take 1 capsule (200 mg total) by mouth 2 (two) times daily as needed for cough. (Patient not taking: Reported on 12/27/2023) 20 capsule 0 Not Taking    Current Facility-Administered Medications  Medication Dose Route Frequency Provider Last Rate Last Admin   bupivacaine  liposome (EXPAREL ) 1.3 % injection 266 mg  20 mL Infiltration Once Sheldon Standing, MD        Chlorhexidine  Gluconate Cloth 2 % PADS 6 each  6 each Topical Once Sheldon Standing, MD       lactated ringers  infusion   Intravenous Continuous Stoltzfus, Cordella SQUIBB, DO 10 mL/hr at 01/05/24 1215 New Bag at 01/05/24 1215     No Known Allergies  Ht 5' 6 (  1.676 m)   Wt 81.2 kg   BMI 28.89 kg/m   Labs: Results for orders placed or performed during the hospital encounter of 01/05/24 (from the past 48 hours)  Glucose, capillary     Status: None   Collection Time: 01/05/24 12:15 PM  Result Value Ref Range   Glucose-Capillary 95 70 - 99 mg/dL    Comment: Glucose reference range applies only to samples taken after fasting for at least 8 hours.   Comment 1 Notify RN     Imaging / Studies: No results found.   Briant KYM Schultze, M.D., F.A.C.S. Gastrointestinal and Minimally Invasive Surgery [facility] [address] [place], [place] [address] [phone] Main / Paging  01/05/2024 12:26 PM    Elspeth JAYSON Schultze

## 2024-01-05 NOTE — Op Note (Signed)
 01/05/2024  3:07 PM  PATIENT:  Renee Collins  83 y.o. female  Patient Care Team: Antonetta Rollene BRAVO, MD as PCP - General (Family Medicine) Delford Maude BROCKS, MD as PCP - Cardiology (Cardiology) Antonetta Rollene BRAVO, MD Sheldon Standing, MD as Consulting Physician (General Surgery) Ahmed, Deatrice FALCON, MD as Consulting Physician (Gastroenterology) Lamon Pleasant CHRISTELLA DEVONNA as Physician Assistant (Oncology) Darroll Anes, DO (Optometry) Gaynel Delon CROME, DPM as Consulting Physician (Podiatry)  PRE-OPERATIVE DIAGNOSIS:  HEMORRHOID - EXTERNAL and HEMORRHOID - INTERNAL GRADE 4 PROLAPSED  POST-OPERATIVE DIAGNOSIS:  HEMORRHOID - EXTERNAL and HEMORRHOID - INTERNAL GRADE 4 PROLAPSED  PROCEDURE: HEMORRHOIDAL LIGATION & HEMORRHOIDOPEXY, HEMORRHOIDECTOMY x 3, and ANORECTAL EXAMINATION UNDER ANESTHESIA  SURGEON:  Standing KYM Sheldon, MD  ASSISTANT:  Odelia Flock, MD, PGY4, [employer]     ANESTHESIA:   General endotracheal intubation anesthesia (GETA) and Anorectal and field block for perioperative & postoperative pain control provided with liposomal bupivacaine  (Experel) 20mL mixed with 30mL of bupivicaine 0.25% with epinephrine   Estimated Blood Loss (EBL):   Total I/O In: 650 [I.V.:650] Out: 50 [Blood:50].   (See anesthesia record)  Delay start of Pharmacological VTE agent (>24hrs) due to concerns of significant anemia, surgical blood loss, or risk of bleeding?:  no  DRAINS: (None)  SPECIMEN:  Hemorrhoid: Right anterior, Right posterior, and Left lateral  DISPOSITION OF SPECIMEN:  Pathology  COUNTS:  Sponge, needle, & instrument counts CORRECT at the conclusion of the case.      PLAN OF CARE: Discharge to home after PACU  PATIENT DISPOSITION:  PACU - hemodynamically stable.  INDICATION: Pleasant patient with struggles with hemorrhoids.  Not able to be managed in the office despite an improved bowel regimen.  I recommended examination under anesthesia and surgical treatment:  The  anatomy & physiology of the anorectal region was discussed.  The pathophysiology of hemorrhoids and differential diagnosis was discussed.  Natural history risks without surgery was discussed.   I stressed the importance of a bowel regimen to have daily soft bowel movements to minimize progression of disease.  Interventions such as sclerotherapy & banding were discussed.  The patient's symptoms are not adequately controlled by medicines and other non-operative treatments.  I feel the risks & problems of no surgery outweigh the operative risks; therefore, I recommended surgery to treat the hemorrhoids by ligation, pexy, and possible resection.  Risks such as bleeding, infection, need for further treatment, heart attack, death, and other risks were discussed.   I noted a good likelihood this will help address the problem.  Goals of post-operative recovery were discussed as well.  Possibility that this will not correct all symptoms was explained.  Post-operative pain, bleeding, constipation, urinary difficulties, and other problems after surgery were discussed.  We will work to minimize complications.   Educational handouts further explaining the pathology, treatment options, and bowel regimen were given as well.  Questions were answered.  The patient expresses understanding & wishes to proceed with surgery.  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  Left lateral greater than right posterior greater than right anterior.  Ligation/pexy/hemorrhoidectomy is done.  No anal fissure, fistula, condyloma.  No true circumferential rectal procidentia prolapse.  DESCRIPTION:  Informed consent was confirmed. Patient underwent general anesthesia without difficulty. Patient was placed into prone/jacknife positioning.  The perianal region was prepped and draped in sterile fashion. Surgical timeout confirmed or plan.  I did digital rectal examination and then transitioned over to anoscopy to  get a sense  of the anatomy.  Findings noted above.   I proceeded to do hemorrhoidal ligation and pexy using a large self-retaining Parks retractor & occasionally alternating with a large International aid/development worker.  I used a 2-0 Vicryl suture on a UR-6 needle in a figure-of-eight fashion 6 cm proximal to the anal verge.  I started at the largest hemorrhoid pile, left lateral.  Because of redundant hemorrhoidal tissue too bulky to merely ligate or pexy, I excised the excess internal hemorrhoid piles longitudinally in a fusiform biconcave fashion, sparing the anal canal to avoid narrowing.  I then ran that stitch longitudinally more distally to close the hemorrhoidectomy wound to the anal verge over a large Parks self-retaining anal retractor to avoid narrowing of the anal canal.  I then tied that stitch down to cause a hemorrhoidopexy.   I also had to do an excision at the  right anterior and right posterior pile locations.  I then did hemorrhoidal ligation and pexy at the other 3 hemorrhoidal columns.  At the completion of this, all 6 anorectal columns were ligated and pexied in the classic hexagonal fashion (right anterior/lateral/posterior, left anterior/lateral/posterior).    I redid anoscopy and examination.  Hemostasis was good.  I radially trimmed and closed closed the external part of the hemorrhoidectomy wounds with radial interrupted horizontal mattress 2-0 chromic suture over a large Sawyer anal retractor, leaving the last 5 mm open to allow natural drainage.  I repeated anoscopy & examination.  At completion of this, all hemorrhoids had been trimmed down and/or reduced into the rectum.  There is no more prolapse.  Internal & external anatomy was more more normal.  Hemostasis was good.  Fluffed gauze was on-laid over the perianal region.  No packing done.  Patient is being extubated go to go to the recovery room.  I had discussed postop care in detail with the patient in the preop holding area.   Instructions for post-operative recovery and prescriptions are written. I discussed operative findings, updated the patient's status, discussed probable steps to recovery, and gave postoperative recommendations to the patient's niece, Wynona Orchard.  Recommendations were made.  Questions were answered.  She expressed understanding & appreciation.  Elspeth KYM Schultze, M.D., F.A.C.S. Gastrointestinal and Minimally Invasive Surgery [facility] [address] [place], [place] [address] [phone] Main / Paging

## 2024-01-06 ENCOUNTER — Encounter (HOSPITAL_COMMUNITY): Payer: Self-pay | Admitting: Surgery

## 2024-01-09 LAB — SURGICAL PATHOLOGY

## 2024-01-12 ENCOUNTER — Inpatient Hospital Stay: Attending: Physician Assistant

## 2024-01-12 DIAGNOSIS — N184 Chronic kidney disease, stage 4 (severe): Secondary | ICD-10-CM | POA: Insufficient documentation

## 2024-01-12 DIAGNOSIS — N1832 Chronic kidney disease, stage 3b: Secondary | ICD-10-CM

## 2024-01-12 DIAGNOSIS — D122 Benign neoplasm of ascending colon: Secondary | ICD-10-CM | POA: Diagnosis not present

## 2024-01-12 DIAGNOSIS — D631 Anemia in chronic kidney disease: Secondary | ICD-10-CM | POA: Diagnosis not present

## 2024-01-12 DIAGNOSIS — Z803 Family history of malignant neoplasm of breast: Secondary | ICD-10-CM | POA: Diagnosis not present

## 2024-01-12 DIAGNOSIS — E1122 Type 2 diabetes mellitus with diabetic chronic kidney disease: Secondary | ICD-10-CM | POA: Insufficient documentation

## 2024-01-12 DIAGNOSIS — D500 Iron deficiency anemia secondary to blood loss (chronic): Secondary | ICD-10-CM | POA: Diagnosis not present

## 2024-01-12 DIAGNOSIS — N281 Cyst of kidney, acquired: Secondary | ICD-10-CM | POA: Diagnosis not present

## 2024-01-12 DIAGNOSIS — D472 Monoclonal gammopathy: Secondary | ICD-10-CM | POA: Diagnosis not present

## 2024-01-12 LAB — COMPREHENSIVE METABOLIC PANEL WITH GFR
ALT: 34 U/L (ref 0–44)
AST: 33 U/L (ref 15–41)
Albumin: 3.6 g/dL (ref 3.5–5.0)
Alkaline Phosphatase: 71 U/L (ref 38–126)
Anion gap: 10 (ref 5–15)
BUN: 36 mg/dL — ABNORMAL HIGH (ref 8–23)
CO2: 22 mmol/L (ref 22–32)
Calcium: 9.3 mg/dL (ref 8.9–10.3)
Chloride: 102 mmol/L (ref 98–111)
Creatinine, Ser: 3.27 mg/dL — ABNORMAL HIGH (ref 0.44–1.00)
GFR, Estimated: 13 mL/min — ABNORMAL LOW (ref >60)
Glucose, Bld: 114 mg/dL — ABNORMAL HIGH (ref 70–99)
Potassium: 3.9 mmol/L (ref 3.5–5.1)
Sodium: 134 mmol/L — ABNORMAL LOW (ref 135–145)
Total Bilirubin: 0.7 mg/dL (ref 0.0–1.2)
Total Protein: 8.9 g/dL — ABNORMAL HIGH (ref 6.5–8.1)

## 2024-01-12 LAB — CBC WITH DIFFERENTIAL/PLATELET
Abs Immature Granulocytes: 0.01 K/uL (ref 0.00–0.07)
Basophils Absolute: 0 K/uL (ref 0.0–0.1)
Basophils Relative: 1 %
Eosinophils Absolute: 0.3 K/uL (ref 0.0–0.5)
Eosinophils Relative: 4 %
HCT: 32.6 % — ABNORMAL LOW (ref 36.0–46.0)
Hemoglobin: 10.2 g/dL — ABNORMAL LOW (ref 12.0–15.0)
Immature Granulocytes: 0 %
Lymphocytes Relative: 30 %
Lymphs Abs: 1.9 K/uL (ref 0.7–4.0)
MCH: 26.7 pg (ref 26.0–34.0)
MCHC: 31.3 g/dL (ref 30.0–36.0)
MCV: 85.3 fL (ref 80.0–100.0)
Monocytes Absolute: 0.7 K/uL (ref 0.1–1.0)
Monocytes Relative: 12 %
Neutro Abs: 3.4 K/uL (ref 1.7–7.7)
Neutrophils Relative %: 53 %
Platelets: 240 K/uL (ref 150–400)
RBC: 3.82 MIL/uL — ABNORMAL LOW (ref 3.87–5.11)
RDW: 13.1 % (ref 11.5–15.5)
WBC: 6.3 K/uL (ref 4.0–10.5)
nRBC: 0 % (ref 0.0–0.2)

## 2024-01-12 LAB — IRON AND TIBC
Iron: 34 ug/dL (ref 28–170)
Saturation Ratios: 13 % (ref 10.4–31.8)
TIBC: 271 ug/dL (ref 250–450)
UIBC: 237 ug/dL

## 2024-01-12 LAB — LACTATE DEHYDROGENASE: LDH: 139 U/L (ref 98–192)

## 2024-01-12 LAB — FERRITIN: Ferritin: 101 ng/mL (ref 11–307)

## 2024-01-19 ENCOUNTER — Inpatient Hospital Stay (HOSPITAL_BASED_OUTPATIENT_CLINIC_OR_DEPARTMENT_OTHER): Admitting: Oncology

## 2024-01-19 DIAGNOSIS — D631 Anemia in chronic kidney disease: Secondary | ICD-10-CM | POA: Diagnosis not present

## 2024-01-19 DIAGNOSIS — D500 Iron deficiency anemia secondary to blood loss (chronic): Secondary | ICD-10-CM

## 2024-01-19 DIAGNOSIS — D472 Monoclonal gammopathy: Secondary | ICD-10-CM

## 2024-01-19 DIAGNOSIS — N1832 Chronic kidney disease, stage 3b: Secondary | ICD-10-CM | POA: Diagnosis not present

## 2024-01-19 NOTE — Progress Notes (Signed)
 [facility] [address] [place] [place], [place] [address]   CLINIC:  Medical Oncology/Hematology  PCP:  Antonetta Rollene BRAVO, MD [address], [address] [place] [address] [phone]   REASON FOR VISIT:  Follow-up for MGUS and normocytic anemia  CURRENT THERAPY: Intermittent Feraheme  (last on 02/15/2020)  INTERVAL HISTORY:   Ms. Keahey 83 y.o. female returns for routine follow-up of MGUS and anemia.    Since her last visit, she had a hemorrhoidectomy with ligation and hemorrhoidopexy on 01/05/2024 with Dr. Sheldon.  Prior to that she was seen by [facility] surgery at [facility] for anal prolapse.  Reports overall she is doing well.  Reports she occasionally will have small amounts of blood when she wipes since surgery.  Rates pain a 5 out of 10 when she has bowel movements.  Overall, she is doing well appetite is 75% energy levels are 50%. She takes a multivitamin with iron about any issue.   ASSESSMENT & PLAN:  1.  IgG kappa MGUS: - Bone marrow biopsy on 05/16/2015 shows limited bone marrow sample with no increase in plasma cells in the very limited material present.  Cytogenetics-46, XX.  FISH panel was normal. - Immunofixation shows monoclonal protein with IgG kappa specificity - Bone survey on 02/22/2017 showed ill-defined calcified lesion in the distal right femur.  MRI of the right femur on 03/02/2017 showed no osseous lesion. - PET scan on 02/25/2020 was negative for any myeloma lesions or other malignancies. - Most recent skeletal survey (04/20/2023): No discrete pathologic osseous lesions in the axial or appendicular skeleton - No B symptoms or new bony pain  2.  Normocytic anemia: -This is from chronic kidney disease and relative iron deficiency.  She last received IV Feraheme  on 02/15/2020 - Denies any bleeding per rectum or melena   3.  CKD stage IIIb/IV: - Per note by Dr. Rachele, CKD is secondary to diabetes mellitus - Creatinine is usually between 1.5 and  1.8 - Renal ultrasound on 01/24/2020 shows small right renal cyst with no additional findings. -Continue follow-up with Dr. Rachele.  4.  ABNORMAL COLONOSCOPY - Colonoscopy from 06/14/2023 with protruding soft large lesion of perianal region - She was referred to colorectal surgery who recommended a hemorrhoidectomy or hemorrhoid ligation.  She has deferred at this time.   Plan: 1. MGUS (monoclonal gammopathy of unknown significance) (Primary) - Most recent labs (09/07/23):  M spike improved and is 1.8. Elevated kappa 194.4, normal lambda 19.8, elevated ratio 9.87.  LDH normal. - No CRAB features at this time (01/12/24) - creatinine 3.27 (baseline CKD), calcium 9.3, Hgb 10.2/MCV 85.3 - Repeat MGUS/myeloma panel in 6 months.  - 24-hour urine/UPEP 06/21/2023 showed Bence-Jones protein positive kappa type.  Immunofixation shows IgG monoclonal protein with kappa light chain specificity.  M spike 3.3. - Next skeletal survey due in 1 year (October 2025).  Orders placed. -Will continue to monitor with labs every 3 months and MGUS labs every 6.    2. Iron deficiency anemia due to chronic blood loss - Most recent labs (01/12/2024): Ferritin 101, iron saturation 13%.  Hgb 10.2/MCV 85.3. - Hemoglobin is at goal considering underlying CKD stage IIIb/IV - If patient has worsening anemia with Hgb < 10.0, will consider starting her on ESA -No additional iron needed at this time.  3. Stage 3b chronic kidney disease (HCC) -Stable.  She is followed by Dr. Rachele. -We discussed increase in creatinine to 3.27 (2.05).  Reports she just saw Dr.  Bhutani a few weeks ago and he was aware. -We discussed hydration and drinking plenty of fluids.  Avoid nephrotoxic medications.  4. Adenomatous polyp of ascending colon -Colonoscopy from December 2024 showed 1 polyp and a anal protrusion and she was referred to a colorectal specialist.  -She had hemorrhoidectomy with ligation on 01/05/2024 with Dr. Sheldon. -Has had a  little bit of bleeding since her surgery earlier in the month.  PLAN SUMMARY: >> Labs in 4 months = CBC/D, CMP, LDH, ferritin, iron/TIBC >> OFFICE visit in 4 months (1 week after lab)     REVIEW OF SYSTEMS:  Review of Systems  Constitutional:  Positive for fatigue.  Gastrointestinal:  Positive for blood in stool (on toilet paper).     PHYSICAL EXAM:  ECOG PERFORMANCE STATUS: 1 - Symptomatic but completely ambulatory  There were no vitals filed for this visit.  There were no vitals filed for this visit.  Physical Exam Constitutional:      Appearance: Normal appearance.  Cardiovascular:     Rate and Rhythm: Normal rate and regular rhythm.  Pulmonary:     Effort: Pulmonary effort is normal.     Breath sounds: Normal breath sounds.  Abdominal:     General: Bowel sounds are normal.     Palpations: Abdomen is soft.  Musculoskeletal:        General: No swelling. Normal range of motion.  Neurological:     Mental Status: She is alert and oriented to person, place, and time. Mental status is at baseline.     PAST MEDICAL/SURGICAL HISTORY:  Past Medical History:  Diagnosis Date   Anemia    Arthritis    Chronic back pain    Chronic kidney disease    Depression    Diabetes mellitus without complication (HCC)    GERD (gastroesophageal reflux disease)    Heart murmur    Hyperlipidemia    Hypertension    Hypothyroidism    Past Surgical History:  Procedure Laterality Date   ANTERIOR CERVICAL DECOMP/DISCECTOMY FUSION N/A 09/15/2021   Procedure: Anterior Cervical Decompression Discectomy Fusion Cervical three-four, Cervical four-five;  Surgeon: Debby Dorn MATSU, MD;  Location[facility] OR;  Service: Neurosurgery;  Laterality: N/A;   BONE MARROW ASPIRATION Left 05/16/15   BONE MARROW BIOPSY Left 05/16/15   CATARACT EXTRACTION W/PHACO Right 02/21/2023   Procedure: CATARACT EXTRACTION PHACO AND INTRAOCULAR LENS PLACEMENT (IOC);  Surgeon: Harrie Agent, MD;  Location: [facility] ORS;   Service: Ophthalmology;  Laterality: Right;  CDE: 7.42   CATARACT EXTRACTION W/PHACO Left 03/07/2023   Procedure: CATARACT EXTRACTION PHACO AND INTRAOCULAR LENS PLACEMENT (IOC);  Surgeon: Harrie Agent, MD;  Location: [facility] ORS;  Service: Ophthalmology;  Laterality: Left;  CDE: 8.10   COLONOSCOPY  03/30/2012   Procedure: COLONOSCOPY;  Surgeon: Claudis RAYMOND Rivet, MD;  Location: [facility] END[facility] SUITE;  Service: Endoscopy;  Laterality: N/A;  730   COLONOSCOPY N/A 06/30/2017   Procedure: COLONOSCOPY;  Surgeon: Rivet Claudis RAYMOND, MD;  Location: [facility];  Service: Endoscopy;  Laterality: N/A;  2:00   COLONOSCOPY WITH PROPOFOL  N/A 06/14/2023   Procedure: COLONOSCOPY WITH PROPOFOL ;  Surgeon: Cinderella Deatrice FALCON, MD;  Location: [facility];  Service: Endoscopy;  Laterality: N/A;   HEMORRHOID SURGERY N/A 01/05/2024   Procedure: HEMORRHOIDECTOMY;  Surgeon: Sheldon Standing, MD;  Location: [facility] ORS;  Service: General;  Laterality: N/A;  HEMORRHOIDECTOMY WITH LIGATION & HEMORRHOIDOPEXY   LAMINECTOMY  1980's   POLYPECTOMY  06/30/2017   Procedure: POLYPECTOMY;  Surgeon: Rivet Claudis  U, MD;  Location: [facility];  Service: Endoscopy;;  colon   POLYPECTOMY  06/14/2023   Procedure: POLYPECTOMY;  Surgeon: Cinderella Deatrice FALCON, MD;  Location: [facility];  Service: Endoscopy;;   RECTAL EXAM UNDER ANESTHESIA N/A 01/05/2024   Procedure: EXAM UNDER ANESTHESIA, RECTUM;  Surgeon: Sheldon Standing, MD;  Location: [facility] ORS;  Service: General;  Laterality: N/A;   SPINE SURGERY  approx 1983   dr leeann   TOTAL ABDOMINAL HYSTERECTOMY  approx 1993   fibroids     SOCIAL HISTORY:  Social History   Socioeconomic History   Marital status: Divorced    Spouse name: Not on file   Number of children: 3   Years of education: Not on file   Highest education level: Not on file  Occupational History   Occupation: LPN  Tobacco Use   Smoking status: Never   Smokeless tobacco: Never  Vaping Use   Vaping status: Never Used  Substance and Sexual  Activity   Alcohol use: No   Drug use: No   Sexual activity: Not Currently  Other Topics Concern   Not on file  Social History Narrative   Pt has 2 living adult children, 1 desease at age 3 secondary congential heart disease.   7 grandchildren.   Social Drivers of Corporate investment banker Strain: Low Risk  (09/06/2023)   Overall Financial Resource Strain (CARDIA)    Difficulty of Paying Living Expenses: Not hard at all  Food Insecurity: No Food Insecurity (09/06/2023)   Hunger Vital Sign    Worried About Running Out of Food in the Last Year: Never true    Ran Out of Food in the Last Year: Never true  Transportation Needs: No Transportation Needs (09/06/2023)   PRAPARE - Administrator, Civil Service (Medical): No    Lack of Transportation (Non-Medical): No  Physical Activity: Sufficiently Active (09/06/2023)   Exercise Vital Sign    Days of Exercise per Week: 7 days    Minutes of Exercise per Session: 30 min  Stress: No Stress Concern Present (09/06/2023)   [employer] of Occupational Health - Occupational Stress Questionnaire    Feeling of Stress : Not at all  Social Connections: Moderately Integrated (09/06/2023)   Social Connection and Isolation Panel    Frequency of Communication with Friends and Family: More than three times a week    Frequency of Social Gatherings with Friends and Family: More than three times a week    Attends Religious Services: More than 4 times per year    Active Member of [employer] or Organizations: Yes    Attends Engineer, structural: More than 4 times per year    Marital Status: Divorced  Intimate Partner Violence: Not At Risk (09/06/2023)   Humiliation, Afraid, Rape, and Kick questionnaire    Fear of Current or Ex-Partner: No    Emotionally Abused: No    Physically Abused: No    Sexually Abused: No    FAMILY HISTORY:  Family History  Problem Relation Age of Onset   Heart attack Mother 6   Heart disease Mother     Cancer Mother        type unknown   Diabetes Mother    Kidney failure Father    Kidney disease Father    Myasthenia gravis Brother    Diabetes Sister    Breast cancer Sister 67   Heart disease Brother    Diabetes Sister    Sickle  cell anemia Sister    Cancer Sister        unknown type    CURRENT MEDICATIONS:  Outpatient Encounter Medications as of 01/19/2024  Medication Sig   acetaminophen  (TYLENOL ) 650 MG CR tablet Take 650 mg by mouth every 8 (eight) hours as needed for pain.    amLODipine  (NORVASC ) 10 MG tablet TAKE 1 TABLET BY MOUTH DAILY.   aspirin 81 MG EC tablet Take 81 mg by mouth daily.   azelastine  (ASTELIN ) 0.1 % nasal spray Place 2 sprays into both nostrils 2 (two) times daily. Use in each nostril as directed (Patient not taking: Reported on 12/27/2023)   benazepril  (LOTENSIN ) 5 MG tablet Take 1 tablet (5 mg total) by mouth daily.   benzonatate  (TESSALON ) 200 MG capsule Take 1 capsule (200 mg total) by mouth 2 (two) times daily as needed for cough. (Patient not taking: Reported on 12/27/2023)   buPROPion  (WELLBUTRIN  XL) 150 MG 24 hr tablet Take 1 tablet (150 mg total) by mouth daily.   calcitRIOL (ROCALTROL) 0.25 MCG capsule Take 0.25 mcg by mouth every Monday, Wednesday, and Friday.   Calcium-Magnesium-Vitamin D  (CALCIUM 1200+D3 PO) Take 1 tablet by mouth 2 (two) times daily.   diazepam  (VALIUM ) 5 MG tablet Take 1 tablet (5 mg total) by mouth every 8 (eight) hours as needed for muscle spasms (difficulty urinating).   diphenhydrAMINE (BENADRYL) 25 mg capsule Take 25 mg by mouth at bedtime as needed for allergies or sleep.   hydrochlorothiazide  (HYDRODIURIL ) 25 MG tablet TAKE 1 TABLET BY MOUTH DAILY.   levothyroxine  (SYNTHROID ) 88 MCG tablet TAKE 1 TABLET BY MOUTH ONCE DAILY. 1/2 HOUR BEFORE FIRST MEAL OF THE DAY. TAKE WITH WATER .   Misc Natural Products (JOINT HEALTH PO) Take 1 capsule by mouth daily.   Multiple Vitamin (MULTIVITAMIN WITH MINERALS) TABS tablet Take 1 tablet by  mouth daily.   omeprazole  (PRILOSEC) 20 MG capsule Take 1 capsule (20 mg total) by mouth daily as needed (for acid reflux.).   oxyCODONE  (OXY IR/ROXICODONE ) 5 MG immediate release tablet Take 1 tablet (5 mg total) by mouth every 6 (six) hours as needed for severe pain (pain score 7-10) or breakthrough pain.   pravastatin  (PRAVACHOL ) 20 MG tablet TAKE ONE TABLET BY MOUTH IN THE EVENING.   No facility-administered encounter medications on file as of 01/19/2024.    ALLERGIES:  No Known Allergies  LABORATORY DATA:  I have reviewed the labs as listed.  CBC    Component Value Date/Time   WBC 6.3 01/12/2024 1039   RBC 3.82 (L) 01/12/2024 1039   HGB 10.2 (L) 01/12/2024 1039   HGB 12.0 07/24/2021 1402   HCT 32.6 (L) 01/12/2024 1039   HCT 36.1 07/24/2021 1402   PLT 240 01/12/2024 1039   PLT 233 07/24/2021 1402   MCV 85.3 01/12/2024 1039   MCV 83 07/24/2021 1402   MCH 26.7 01/12/2024 1039   MCHC 31.3 01/12/2024 1039   RDW 13.1 01/12/2024 1039   RDW 12.4 07/24/2021 1402   LYMPHSABS 1.9 01/12/2024 1039   MONOABS 0.7 01/12/2024 1039   EOSABS 0.3 01/12/2024 1039   BASOSABS 0.0 01/12/2024 1039      Latest Ref Rng & Units 01/12/2024   10:39 AM 12/28/2023   11:08 AM 10/27/2023   10:30 AM  CMP  Glucose 70 - 99 mg/dL 885  91  94   BUN 8 - 23 mg/dL 36  35  30   Creatinine 0.44 - 1.00 mg/dL 6.72  7.94  2.02   Sodium 135 - 145 mmol/L 134  137  139   Potassium 3.5 - 5.1 mmol/L 3.9  4.2  4.2   Chloride 98 - 111 mmol/L 102  107  108   CO2 22 - 32 mmol/L 22  22  20    Calcium 8.9 - 10.3 mg/dL 9.3  9.4  9.5   Total Protein 6.5 - 8.1 g/dL 8.9   7.8   Total Bilirubin 0.0 - 1.2 mg/dL 0.7   0.4   Alkaline Phos 38 - 126 U/L 71   70   AST 15 - 41 U/L 33   24   ALT 0 - 44 U/L 34   21     DIAGNOSTIC IMAGING:  I have independently reviewed the relevant imaging and discussed with the patient.   WRAP UP:  All questions were answered. The patient knows to call the clinic with any problems, questions or  concerns.  Medical decision making: Moderate  Time spent on visit: I spent 25 minutes counseling the patient face to face. The total time spent in the appointment was 30 minutes and more than 50% was on counseling.  Delon FORBES Hope, NP  01/19/24 9:51 AM

## 2024-01-19 NOTE — Patient Instructions (Signed)
 Start ferrous gluconate slow release with vitamin C daily.   See you back in 3 months.   Drink fluids.

## 2024-01-20 DIAGNOSIS — Z008 Encounter for other general examination: Secondary | ICD-10-CM | POA: Diagnosis not present

## 2024-01-20 DIAGNOSIS — E039 Hypothyroidism, unspecified: Secondary | ICD-10-CM | POA: Diagnosis not present

## 2024-01-20 DIAGNOSIS — E785 Hyperlipidemia, unspecified: Secondary | ICD-10-CM | POA: Diagnosis not present

## 2024-01-20 DIAGNOSIS — G4700 Insomnia, unspecified: Secondary | ICD-10-CM | POA: Diagnosis not present

## 2024-01-20 DIAGNOSIS — Z6828 Body mass index (BMI) 28.0-28.9, adult: Secondary | ICD-10-CM | POA: Diagnosis not present

## 2024-01-20 DIAGNOSIS — E1122 Type 2 diabetes mellitus with diabetic chronic kidney disease: Secondary | ICD-10-CM | POA: Diagnosis not present

## 2024-01-20 DIAGNOSIS — E1169 Type 2 diabetes mellitus with other specified complication: Secondary | ICD-10-CM | POA: Diagnosis not present

## 2024-01-20 DIAGNOSIS — F3341 Major depressive disorder, recurrent, in partial remission: Secondary | ICD-10-CM | POA: Diagnosis not present

## 2024-01-20 DIAGNOSIS — N185 Chronic kidney disease, stage 5: Secondary | ICD-10-CM | POA: Diagnosis not present

## 2024-01-20 DIAGNOSIS — I120 Hypertensive chronic kidney disease with stage 5 chronic kidney disease or end stage renal disease: Secondary | ICD-10-CM | POA: Diagnosis not present

## 2024-01-20 DIAGNOSIS — K219 Gastro-esophageal reflux disease without esophagitis: Secondary | ICD-10-CM | POA: Diagnosis not present

## 2024-01-20 DIAGNOSIS — E663 Overweight: Secondary | ICD-10-CM | POA: Diagnosis not present

## 2024-01-22 ENCOUNTER — Other Ambulatory Visit: Payer: Self-pay | Admitting: Family Medicine

## 2024-01-22 DIAGNOSIS — F324 Major depressive disorder, single episode, in partial remission: Secondary | ICD-10-CM

## 2024-03-05 DIAGNOSIS — N185 Chronic kidney disease, stage 5: Secondary | ICD-10-CM | POA: Diagnosis not present

## 2024-03-05 DIAGNOSIS — I129 Hypertensive chronic kidney disease with stage 1 through stage 4 chronic kidney disease, or unspecified chronic kidney disease: Secondary | ICD-10-CM | POA: Diagnosis not present

## 2024-03-05 DIAGNOSIS — E785 Hyperlipidemia, unspecified: Secondary | ICD-10-CM | POA: Diagnosis not present

## 2024-03-05 DIAGNOSIS — Z008 Encounter for other general examination: Secondary | ICD-10-CM | POA: Diagnosis not present

## 2024-03-26 DIAGNOSIS — I129 Hypertensive chronic kidney disease with stage 1 through stage 4 chronic kidney disease, or unspecified chronic kidney disease: Secondary | ICD-10-CM | POA: Diagnosis not present

## 2024-03-26 DIAGNOSIS — Z008 Encounter for other general examination: Secondary | ICD-10-CM | POA: Diagnosis not present

## 2024-03-26 DIAGNOSIS — E785 Hyperlipidemia, unspecified: Secondary | ICD-10-CM | POA: Diagnosis not present

## 2024-03-26 DIAGNOSIS — N185 Chronic kidney disease, stage 5: Secondary | ICD-10-CM | POA: Diagnosis not present

## 2024-04-11 ENCOUNTER — Ambulatory Visit: Admitting: Family Medicine

## 2024-04-19 ENCOUNTER — Inpatient Hospital Stay

## 2024-04-26 ENCOUNTER — Inpatient Hospital Stay: Admitting: Oncology

## 2024-05-01 ENCOUNTER — Encounter: Payer: Self-pay | Admitting: Podiatry

## 2024-05-01 ENCOUNTER — Ambulatory Visit: Admitting: Podiatry

## 2024-05-01 DIAGNOSIS — E0822 Diabetes mellitus due to underlying condition with diabetic chronic kidney disease: Secondary | ICD-10-CM | POA: Diagnosis not present

## 2024-05-01 DIAGNOSIS — M79674 Pain in right toe(s): Secondary | ICD-10-CM | POA: Diagnosis not present

## 2024-05-01 DIAGNOSIS — M79675 Pain in left toe(s): Secondary | ICD-10-CM | POA: Diagnosis not present

## 2024-05-01 DIAGNOSIS — B351 Tinea unguium: Secondary | ICD-10-CM | POA: Diagnosis not present

## 2024-05-01 DIAGNOSIS — Z794 Long term (current) use of insulin: Secondary | ICD-10-CM

## 2024-05-01 DIAGNOSIS — N1832 Chronic kidney disease, stage 3b: Secondary | ICD-10-CM

## 2024-05-02 ENCOUNTER — Inpatient Hospital Stay: Attending: Physician Assistant

## 2024-05-02 DIAGNOSIS — Z803 Family history of malignant neoplasm of breast: Secondary | ICD-10-CM | POA: Diagnosis not present

## 2024-05-02 DIAGNOSIS — D122 Benign neoplasm of ascending colon: Secondary | ICD-10-CM | POA: Diagnosis not present

## 2024-05-02 DIAGNOSIS — D472 Monoclonal gammopathy: Secondary | ICD-10-CM | POA: Diagnosis present

## 2024-05-02 DIAGNOSIS — D631 Anemia in chronic kidney disease: Secondary | ICD-10-CM | POA: Insufficient documentation

## 2024-05-02 DIAGNOSIS — N184 Chronic kidney disease, stage 4 (severe): Secondary | ICD-10-CM | POA: Diagnosis not present

## 2024-05-02 DIAGNOSIS — E1122 Type 2 diabetes mellitus with diabetic chronic kidney disease: Secondary | ICD-10-CM | POA: Diagnosis not present

## 2024-05-02 DIAGNOSIS — N281 Cyst of kidney, acquired: Secondary | ICD-10-CM | POA: Insufficient documentation

## 2024-05-02 DIAGNOSIS — D500 Iron deficiency anemia secondary to blood loss (chronic): Secondary | ICD-10-CM | POA: Diagnosis not present

## 2024-05-02 LAB — IRON AND TIBC
Iron: 88 ug/dL (ref 28–170)
Saturation Ratios: 31 % (ref 10.4–31.8)
TIBC: 283 ug/dL (ref 250–450)
UIBC: 195 ug/dL

## 2024-05-02 LAB — COMPREHENSIVE METABOLIC PANEL WITH GFR
ALT: 16 U/L (ref 0–44)
AST: 24 U/L (ref 15–41)
Albumin: 4.3 g/dL (ref 3.5–5.0)
Alkaline Phosphatase: 79 U/L (ref 38–126)
Anion gap: 12 (ref 5–15)
BUN: 25 mg/dL — ABNORMAL HIGH (ref 8–23)
CO2: 24 mmol/L (ref 22–32)
Calcium: 9.3 mg/dL (ref 8.9–10.3)
Chloride: 105 mmol/L (ref 98–111)
Creatinine, Ser: 1.85 mg/dL — ABNORMAL HIGH (ref 0.44–1.00)
GFR, Estimated: 26 mL/min — ABNORMAL LOW (ref >60)
Glucose, Bld: 91 mg/dL (ref 70–99)
Potassium: 3.8 mmol/L (ref 3.5–5.1)
Sodium: 141 mmol/L (ref 135–145)
Total Bilirubin: 0.6 mg/dL (ref 0.0–1.2)
Total Protein: 8.8 g/dL — ABNORMAL HIGH (ref 6.5–8.1)

## 2024-05-02 LAB — CBC WITH DIFFERENTIAL/PLATELET
Abs Immature Granulocytes: 0.01 K/uL (ref 0.00–0.07)
Basophils Absolute: 0 K/uL (ref 0.0–0.1)
Basophils Relative: 0 %
Eosinophils Absolute: 0.3 K/uL (ref 0.0–0.5)
Eosinophils Relative: 6 %
HCT: 33 % — ABNORMAL LOW (ref 36.0–46.0)
Hemoglobin: 10.5 g/dL — ABNORMAL LOW (ref 12.0–15.0)
Immature Granulocytes: 0 %
Lymphocytes Relative: 41 %
Lymphs Abs: 1.9 K/uL (ref 0.7–4.0)
MCH: 27.7 pg (ref 26.0–34.0)
MCHC: 31.8 g/dL (ref 30.0–36.0)
MCV: 87.1 fL (ref 80.0–100.0)
Monocytes Absolute: 0.4 K/uL (ref 0.1–1.0)
Monocytes Relative: 8 %
Neutro Abs: 2.1 K/uL (ref 1.7–7.7)
Neutrophils Relative %: 45 %
Platelets: 197 K/uL (ref 150–400)
RBC: 3.79 MIL/uL — ABNORMAL LOW (ref 3.87–5.11)
RDW: 13.9 % (ref 11.5–15.5)
WBC: 4.7 K/uL (ref 4.0–10.5)
nRBC: 0 % (ref 0.0–0.2)

## 2024-05-02 LAB — FERRITIN: Ferritin: 153 ng/mL (ref 11–307)

## 2024-05-02 LAB — LACTATE DEHYDROGENASE: LDH: 175 U/L (ref 98–192)

## 2024-05-03 LAB — KAPPA/LAMBDA LIGHT CHAINS
Kappa free light chain: 179.7 mg/L — ABNORMAL HIGH (ref 3.3–19.4)
Kappa, lambda light chain ratio: 9.77 — ABNORMAL HIGH (ref 0.26–1.65)
Lambda free light chains: 18.4 mg/L (ref 5.7–26.3)

## 2024-05-06 NOTE — Progress Notes (Addendum)
  Subjective:  Patient ID: Renee Collins, female    DOB: 12-Sep-1940,  MRN: [id number]  Renee Collins presents to clinic today for at risk foot care. Pt has h/o NIDDM with chronic kidney disease and painful mycotic toenails x 10 which interfere with daily activities. Pain is relieved with periodic professional debridement.  Chief Complaint  Patient presents with   RFC    RFC Diabetic A1c 6.3 PCP Antonetta Rollene BRAVO, MD  11/03/23    New problem(s): None.   PCP is Antonetta Rollene BRAVO, MD.  No Known Allergies  Review of Systems: Negative except as noted in the HPI.  Objective: No changes noted in today's physical examination. There were no vitals filed for this visit. Renee Collins is a pleasant 83 y.o. female WD, WN in NAD. AAO x 3.  Vascular Examination: Capillary refill time immediate b/l. Palpable pedal pulses. Pedal hair present b/l. No pain with calf compression b/l. Skin temperature gradient WNL b/l. No cyanosis or clubbing b/l. No ischemia or gangrene noted b/l. No edema noted b/l LE.  Neurological Examination: Sensation grossly intact b/l with 10 gram monofilament. Vibratory sensation intact b/l.   Dermatological Examination: Pedal skin with normal turgor, texture and tone b/l.  No open wounds. No interdigital macerations.   Toenails 1-5 b/l thick, discolored, elongated with subungual debris and pain on dorsal palpation.   No corns, calluses nor porokeratotic lesions noted.  Musculoskeletal Examination: Muscle strength 5/5 to all lower extremity muscle groups bilaterally. No pain, crepitus or joint limitation noted with ROM bilateral LE. No gross bony deformities bilaterally.  Radiographs: None  Assessment/Plan: 1. Pain due to onychomycosis of toenails of both feet   2. Diabetes mellitus due to underlying condition with stage 3b chronic kidney disease, with long-term current use of insulin [facility])   Consent given for treatment. Patient examined. All patient's and/or POA's  questions/concerns addressed on today's visit. Mycotic toenails 1-5 b/l debrided in length and girth without incident. Continue foot and shoe inspections daily. Monitor blood glucose per PCP/Endocrinologist's recommendations.Continue soft, supportive shoe gear daily. Report any pedal injuries to medical professional. Call office if there are any quesitons/concerns. Patient/POA to call should there be question/concern in the interim.   Return in about 3 months (around 08/01/2024).  Delon LITTIE Merlin, DPM      [place] LOCATION: [address] [place] [address]                   Office [phone]   [facility] LOCATION: [address] [place], [place] [address] Office [phone]

## 2024-05-08 LAB — PROTEIN ELECTROPHORESIS, SERUM
A/G Ratio: 0.9 (ref 0.7–1.7)
Albumin ELP: 3.8 g/dL (ref 2.9–4.4)
Alpha-1-Globulin: 0.2 g/dL (ref 0.0–0.4)
Alpha-2-Globulin: 0.9 g/dL (ref 0.4–1.0)
Beta Globulin: 0.9 g/dL (ref 0.7–1.3)
Gamma Globulin: 2.3 g/dL — ABNORMAL HIGH (ref 0.4–1.8)
Globulin, Total: 4.3 g/dL — ABNORMAL HIGH (ref 2.2–3.9)
M-Spike, %: 2.1 g/dL — ABNORMAL HIGH
Total Protein ELP: 8.1 g/dL (ref 6.0–8.5)

## 2024-05-09 ENCOUNTER — Inpatient Hospital Stay (HOSPITAL_BASED_OUTPATIENT_CLINIC_OR_DEPARTMENT_OTHER): Admitting: Oncology

## 2024-05-09 VITALS — BP 132/80 | HR 81 | Temp 97.7°F | Resp 18 | Wt 175.5 lb

## 2024-05-09 DIAGNOSIS — D631 Anemia in chronic kidney disease: Secondary | ICD-10-CM | POA: Diagnosis not present

## 2024-05-09 DIAGNOSIS — D472 Monoclonal gammopathy: Secondary | ICD-10-CM

## 2024-05-09 DIAGNOSIS — D500 Iron deficiency anemia secondary to blood loss (chronic): Secondary | ICD-10-CM

## 2024-05-09 DIAGNOSIS — N1832 Chronic kidney disease, stage 3b: Secondary | ICD-10-CM

## 2024-05-09 NOTE — Progress Notes (Signed)
 [facility] [address], [place] [address]   CLINIC:  Medical Oncology/Hematology  PCP:  Antonetta Rollene BRAVO, MD [address], [address] [place] [address] [phone]   REASON FOR VISIT:  Follow-up for MGUS and normocytic anemia  CURRENT THERAPY: Intermittent Feraheme  (last on 02/15/2020)  INTERVAL HISTORY:   Renee Collins 83 y.o. female returns for routine follow-up of MGUS and anemia.    Since her last visit, she was seen by [facility] surgery for follow-up for hemorrhoidectomy on 01/30/2024.  She continues MiraLAX to help with constipation.  Denies any recurrence of bleeding, bright red blood per rectum or melena.  Reports overall she is doing well. Overall, she is doing well appetite is 65% energy levels are 0%. She takes a multivitamin with iron without any issue.   Reports 5 out of 10 back arthritic pain.  Has trouble staying asleep sometimes.  Reports new onset right shoulder pain which has occurred intermittently over the past few years and more constant here over the past few days.  She was previously on gabapentin  but is no longer taking this.  Reports she is unsure if the shoulder pain is secondary to the change in the weather.  No other bony pain.  ASSESSMENT & PLAN:  1.  IgG kappa MGUS: - Bone marrow biopsy on 05/16/2015 shows limited bone marrow sample with no increase in plasma cells in the very limited material present.  Cytogenetics-46, XX.  FISH panel was normal. - Immunofixation shows monoclonal protein with IgG kappa specificity - Bone survey on 02/22/2017 showed ill-defined calcified lesion in the distal right femur.  MRI of the right femur on 03/02/2017 showed no osseous lesion. - PET scan on 02/25/2020 was negative for any myeloma lesions or other malignancies. - Most recent skeletal survey (04/20/2023): No discrete pathologic osseous lesions in the axial or appendicular skeleton - No B symptoms or new bony pain  2.  Normocytic  anemia: -This is from chronic kidney disease and relative iron deficiency.  She last received IV Feraheme  on 02/15/2020 - Denies any bleeding per rectum or melena   3.  CKD stage IIIb/IV: - Per note by Dr. Rachele, CKD is secondary to diabetes mellitus - Creatinine is usually between 1.5 and 1.8 - Renal ultrasound on 01/24/2020 shows small right renal cyst with no additional findings. -Continue follow-up with Dr. Rachele.  4.  ABNORMAL COLONOSCOPY - Colonoscopy from 06/14/2023 with protruding soft large lesion of perianal region - She was referred to colorectal surgery who recommended a hemorrhoidectomy or hemorrhoid ligation.  She has deferred at this time.   Plan: 1. MGUS (monoclonal gammopathy of unknown significance) (Primary) - Most recent labs 05/02/2024: M spike stable at 2.1 Elevated kappa  179.7, normal lambda 18.4, elevated ratio 9.77.  LDH normal. - No CRAB features at this time (05/02/2024) - creatinine 3.27 (baseline CKD), calcium 9.3, Hgb 10.2/MCV 85.3 - Repeat MGUS/myeloma panel in 6 months.  - 24-hour urine/UPEP 06/21/2023 showed Bence-Jones protein positive kappa type.  Immunofixation shows IgG monoclonal protein with kappa light chain specificity.  M spike 3.3. - Patient does not need any repeat bone scan at this time unless labs significantly changed. -Will continue to monitor with labs every 3 months and MGUS labs every 6.    2. Iron deficiency anemia due to chronic blood loss - Most recent labs 05/02/2024 showed ferritin of 153, iron saturation 31% with normal TIBC.  Hemoglobin stable at 10.5. - Hemoglobin is at goal  considering underlying CKD stage IIIb/IV is between 10 and 11. - If patient has worsening anemia with Hgb < 10.0, will consider starting her on ESA -No additional iron needed at this time.  3. Stage 3b chronic kidney disease (HCC) -Stable.  She is followed by Dr. Rachele. - Creatinine significantly improved from previous and is now 1.85 (3.27).   -We  discussed hydration and drinking plenty of fluids.  Avoid nephrotoxic medications.  4. Adenomatous polyp of ascending colon -Colonoscopy from December 2024 showed 1 polyp and a anal protrusion and she was referred to a colorectal specialist.  -She had hemorrhoidectomy with ligation on 01/05/2024 with Dr. Sheldon. -Has had a little bit of bleeding since her surgery earlier in the month.  PLAN SUMMARY: >> Labs in 6 months = CBC/D, CMP, LDH, ferritin, iron/TIBC >> OFFICE visit in 6 months (1 week after lab)     REVIEW OF SYSTEMS:  Review of Systems  Constitutional:  Positive for fatigue.  Musculoskeletal:  Positive for arthralgias.  Psychiatric/Behavioral:  Positive for sleep disturbance.      PHYSICAL EXAM:  ECOG PERFORMANCE STATUS: 1 - Symptomatic but completely ambulatory  Vitals:   05/09/24 1100  BP: 132/80  Pulse: 81  Resp: 18  Temp: 97.7 F (36.5 C)  SpO2: 100%    Filed Weights   05/09/24 1100  Weight: 175 lb 7.8 oz (79.6 kg)    Physical Exam Constitutional:      Appearance: Normal appearance.  Cardiovascular:     Rate and Rhythm: Normal rate and regular rhythm.  Pulmonary:     Effort: Pulmonary effort is normal.     Breath sounds: Normal breath sounds.  Abdominal:     General: Bowel sounds are normal.     Palpations: Abdomen is soft.  Musculoskeletal:        General: No swelling. Normal range of motion.  Neurological:     Mental Status: She is alert and oriented to person, place, and time. Mental status is at baseline.     PAST MEDICAL/SURGICAL HISTORY:  Past Medical History:  Diagnosis Date   Anemia    Arthritis    Chronic back pain    Chronic kidney disease    Depression    Diabetes mellitus without complication (HCC)    GERD (gastroesophageal reflux disease)    Heart murmur    Hyperlipidemia    Hypertension    Hypothyroidism    Past Surgical History:  Procedure Laterality Date   ANTERIOR CERVICAL DECOMP/DISCECTOMY FUSION N/A 09/15/2021    Procedure: Anterior Cervical Decompression Discectomy Fusion Cervical three-four, Cervical four-five;  Surgeon: Debby Dorn MATSU, MD;  Location: [facility] OR;  Service: Neurosurgery;  Laterality: N/A;   BONE MARROW ASPIRATION Left 05/16/15   BONE MARROW BIOPSY Left 05/16/15   CATARACT EXTRACTION W/PHACO Right 02/21/2023   Procedure: CATARACT EXTRACTION PHACO AND INTRAOCULAR LENS PLACEMENT (IOC);  Surgeon: Harrie Agent, MD;  Location: [facility];  Service: Ophthalmology;  Laterality: Right;  CDE: 7.42   CATARACT EXTRACTION W/PHACO Left 03/07/2023   Procedure: CATARACT EXTRACTION PHACO AND INTRAOCULAR LENS PLACEMENT (IOC);  Surgeon: Harrie Agent, MD;  Location: [facility];  Service: Ophthalmology;  Laterality: Left;  CDE: 8.10   COLONOSCOPY  03/30/2012   Procedure: COLONOSCOPY;  Surgeon: Claudis RAYMOND Rivet, MD;  Location: [facility];  Service: Endoscopy;  Laterality: N/A;  730   COLONOSCOPY N/A 06/30/2017   Procedure: COLONOSCOPY;  Surgeon: Rivet Claudis RAYMOND, MD;  Location: [facility];  Service: Endoscopy;  Laterality: N/A;  2:00   COLONOSCOPY WITH PROPOFOL  N/A 06/14/2023   Procedure: COLONOSCOPY WITH PROPOFOL ;  Surgeon: Cinderella Deatrice FALCON, MD;  Location: [facility];  Service: Endoscopy;  Laterality: N/A;   HEMORRHOID SURGERY N/A 01/05/2024   Procedure: HEMORRHOIDECTOMY;  Surgeon: Sheldon Standing, MD;  Location: [facility];  Service: General;  Laterality: N/A;  HEMORRHOIDECTOMY WITH LIGATION & HEMORRHOIDOPEXY   LAMINECTOMY  1980's   POLYPECTOMY  06/30/2017   Procedure: POLYPECTOMY;  Surgeon: Golda Claudis PENNER, MD;  Location: [facility];  Service: Endoscopy;;  colon   POLYPECTOMY  06/14/2023   Procedure: POLYPECTOMY;  Surgeon: Cinderella Deatrice FALCON, MD;  Location: [facility];  Service: Endoscopy;;   RECTAL EXAM UNDER ANESTHESIA N/A 01/05/2024   Procedure: EXAM UNDER ANESTHESIA, RECTUM;  Surgeon: Sheldon Standing, MD;  Location: [facility];  Service: General;  Laterality: N/A;   SPINE SURGERY  approx 1983   dr leeann    TOTAL ABDOMINAL HYSTERECTOMY  approx 1993   fibroids     SOCIAL HISTORY:  Social History   Socioeconomic History   Marital status: Divorced    Spouse name: Not on file   Number of children: 3   Years of education: Not on file   Highest education level: Not on file  Occupational History   Occupation: LPN  Tobacco Use   Smoking status: Never   Smokeless tobacco: Never  Vaping Use   Vaping status: Never Used  Substance and Sexual Activity   Alcohol use: No   Drug use: No   Sexual activity: Not Currently  Other Topics Concern   Not on file  Social History Narrative   Pt has 2 living adult children, 1 desease at age 44 secondary congential heart disease.   7 grandchildren.   Social Drivers of Corporate Investment Banker Strain: Low Risk  (09/06/2023)   Overall Financial Resource Strain (CARDIA)    Difficulty of Paying Living Expenses: Not hard at all  Food Insecurity: No Food Insecurity (09/06/2023)   Hunger Vital Sign    Worried About Running Out of Food in the Last Year: Never true    Ran Out of Food in the Last Year: Never true  Transportation Needs: No Transportation Needs (09/06/2023)   PRAPARE - Administrator, Civil Service (Medical): No    Lack of Transportation (Non-Medical): No  Physical Activity: Sufficiently Active (09/06/2023)   Exercise Vital Sign    Days of Exercise per Week: 7 days    Minutes of Exercise per Session: 30 min  Stress: No Stress Concern Present (09/06/2023)   [employer] of Occupational Health - Occupational Stress Questionnaire    Feeling of Stress : Not at all  Social Connections: Moderately Integrated (09/06/2023)   Social Connection and Isolation Panel    Frequency of Communication with Friends and Family: More than three times a week    Frequency of Social Gatherings with Friends and Family: More than three times a week    Attends Religious Services: More than 4 times per year    Active Member of [employer] or Organizations:  Yes    Attends Banker Meetings: More than 4 times per year    Marital Status: Divorced  Intimate Partner Violence: Not At Risk (09/06/2023)   Humiliation, Afraid, Rape, and Kick questionnaire    Fear of Current or Ex-Partner: No    Emotionally Abused: No    Physically Abused: No    Sexually Abused: No    FAMILY HISTORY:  Family  History  Problem Relation Age of Onset   Heart attack Mother 26   Heart disease Mother    Cancer Mother        type unknown   Diabetes Mother    Kidney failure Father    Kidney disease Father    Myasthenia gravis Brother    Diabetes Sister    Breast cancer Sister 76   Heart disease Brother    Diabetes Sister    Sickle cell anemia Sister    Cancer Sister        unknown type    CURRENT MEDICATIONS:  Outpatient Encounter Medications as of 05/09/2024  Medication Sig   acetaminophen  (TYLENOL ) 650 MG CR tablet Take 650 mg by mouth every 8 (eight) hours as needed for pain.    amLODipine  (NORVASC ) 10 MG tablet TAKE 1 TABLET BY MOUTH DAILY.   aspirin 81 MG EC tablet Take 81 mg by mouth daily.   benazepril  (LOTENSIN ) 5 MG tablet Take 1 tablet (5 mg total) by mouth daily.   buPROPion  (WELLBUTRIN  XL) 150 MG 24 hr tablet TAKE ONE TABLET BY MOUTH ONCE DAILY.   calcitRIOL (ROCALTROL) 0.25 MCG capsule Take 0.25 mcg by mouth every Monday, Wednesday, and Friday.   Calcium-Magnesium-Vitamin D  (CALCIUM 1200+D3 PO) Take 1 tablet by mouth 2 (two) times daily.   diphenhydrAMINE (BENADRYL) 25 mg capsule Take 25 mg by mouth at bedtime as needed for allergies or sleep.   hydrochlorothiazide  (HYDRODIURIL ) 25 MG tablet TAKE 1 TABLET BY MOUTH DAILY.   levothyroxine  (SYNTHROID ) 88 MCG tablet TAKE 1 TABLET BY MOUTH ONCE DAILY. 1/2 HOUR BEFORE FIRST MEAL OF THE DAY. TAKE WITH WATER .   Misc Natural Products (JOINT HEALTH PO) Take 1 capsule by mouth daily.   Multiple Vitamin (MULTIVITAMIN WITH MINERALS) TABS tablet Take 1 tablet by mouth daily.   omeprazole  (PRILOSEC)  20 MG capsule Take 1 capsule (20 mg total) by mouth daily as needed (for acid reflux.).   oxyCODONE  (OXY IR/ROXICODONE ) 5 MG immediate release tablet Take 1 tablet (5 mg total) by mouth every 6 (six) hours as needed for severe pain (pain score 7-10) or breakthrough pain.   pravastatin  (PRAVACHOL ) 20 MG tablet TAKE ONE TABLET BY MOUTH IN THE EVENING.   No facility-administered encounter medications on file as of 05/09/2024.    ALLERGIES:  No Known Allergies  LABORATORY DATA:  I have reviewed the labs as listed.  CBC    Component Value Date/Time   WBC 4.7 05/02/2024 1130   RBC 3.79 (L) 05/02/2024 1130   HGB 10.5 (L) 05/02/2024 1130   HGB 12.0 07/24/2021 1402   HCT 33.0 (L) 05/02/2024 1130   HCT 36.1 07/24/2021 1402   PLT 197 05/02/2024 1130   PLT 233 07/24/2021 1402   MCV 87.1 05/02/2024 1130   MCV 83 07/24/2021 1402   MCH 27.7 05/02/2024 1130   MCHC 31.8 05/02/2024 1130   RDW 13.9 05/02/2024 1130   RDW 12.4 07/24/2021 1402   LYMPHSABS 1.9 05/02/2024 1130   MONOABS 0.4 05/02/2024 1130   EOSABS 0.3 05/02/2024 1130   BASOSABS 0.0 05/02/2024 1130      Latest Ref Rng & Units 05/02/2024   11:30 AM 01/12/2024   10:39 AM 12/28/2023   11:08 AM  CMP  Glucose 70 - 99 mg/dL 91  885  91   BUN 8 - 23 mg/dL 25  36  35   Creatinine 0.44 - 1.00 mg/dL 8.14  6.72  7.94   Sodium 135 - 145  mmol/L 141  134  137   Potassium 3.5 - 5.1 mmol/L 3.8  3.9  4.2   Chloride 98 - 111 mmol/L 105  102  107   CO2 22 - 32 mmol/L 24  22  22    Calcium 8.9 - 10.3 mg/dL 9.3  9.3  9.4   Total Protein 6.5 - 8.1 g/dL 8.8  8.9    Total Bilirubin 0.0 - 1.2 mg/dL 0.6  0.7    Alkaline Phos 38 - 126 U/L 79  71    AST 15 - 41 U/L 24  33    ALT 0 - 44 U/L 16  34      DIAGNOSTIC IMAGING:  I have independently reviewed the relevant imaging and discussed with the patient.   WRAP UP:  All questions were answered. The patient knows to call the clinic with any problems, questions or concerns.  Medical decision  making: Moderate  Time spent on visit: I spent 25 minutes counseling the patient face to face. The total time spent in the appointment was 30 minutes and more than 50% was on counseling.  Delon FORBES Hope, NP  05/09/24 2:16 PM

## 2024-06-25 ENCOUNTER — Encounter: Payer: Self-pay | Admitting: *Deleted

## 2024-06-27 ENCOUNTER — Telehealth: Payer: Self-pay | Admitting: Family Medicine

## 2024-06-27 NOTE — Telephone Encounter (Signed)
 Patient came in office with concerns about shiingles spot. Was wanting a cream or something sent in for her concern.please advise

## 2024-06-29 ENCOUNTER — Ambulatory Visit
Admission: EM | Admit: 2024-06-29 | Discharge: 2024-06-29 | Disposition: A | Discharge status: Home / Self Care | Attending: Nurse Practitioner | Admitting: Nurse Practitioner

## 2024-06-29 DIAGNOSIS — B029 Zoster without complications: Secondary | ICD-10-CM

## 2024-06-29 MED ORDER — VALACYCLOVIR HCL 1 G PO TABS: 1000.0000 mg | ORAL_TABLET | Freq: Three times a day (TID) | ORAL | 0 refills | Status: DC

## 2024-06-29 MED ORDER — GABAPENTIN 100 MG PO CAPS: 100.0000 mg | ORAL_CAPSULE | Freq: Three times a day (TID) | ORAL | 0 refills | Status: DC

## 2024-06-29 NOTE — ED Provider Notes (Signed)
 " [facility]    CSN: [id number] Arrival date & time: 06/29/24  0848      History   Chief Complaint No chief complaint on file.   HPI Renee Collins is a 84 y.o. female.   The history is provided by the patient.   Patient presents for complaints of rash under the left breast.  Patient states symptoms have been present for the past 5 days.  She states that the rash is painful and states that it feels like it is burning.  Patient states that the pain radiates under the left breast.  She denies exposure to new soaps, medications, lotions, foods, or detergents.  Further denies fever, chills, oozing, or drainage from the rash.  Patient reports that she has received her shingles vaccine.  States she has been using over-the-counter topicals with minimal relief of her symptoms.   Past Medical History:  Diagnosis Date   Anemia    Arthritis    Chronic back pain    Chronic kidney disease    Depression    Diabetes mellitus without complication (HCC)    GERD (gastroesophageal reflux disease)    Heart murmur    Hyperlipidemia    Hypertension    Hypothyroidism     Patient Active Problem List   Diagnosis Date Noted   Encounter for Medicare annual examination with abnormal findings 11/03/2023   Normocytic anemia 07/22/2023   Anal lesion 07/22/2023   Adenomatous polyp of ascending colon 06/14/2023   Encounter for immunization 04/18/2023   Unintentional weight loss 04/18/2023   Chronic left hip pain 12/10/2022   Encounter for annual physical exam 07/14/2022   Hypothyroid 11/25/2021   Aortic stenosis, mild 07/24/2021   Abnormal EKG 07/24/2021   Arm pain, lateral, right 04/22/2021   Cervical spine arthritis with nerve pain 04/22/2021   Murmur, cardiac 07/08/2020   Depression, major, single episode, in partial remission 03/28/2020   Early satiety 11/27/2019   Sciatica of right side 03/28/2018   Overweight (BMI 25.0-29.9) 11/25/2017   Monoclonal gammopathy 08/13/2013    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   Stage 3 chronic kidney disease (HCC) 04/09/2013   Cataracts, bilateral 04/09/2013   Seasonal allergies 02/16/2011   Type 2 diabetes mellitus with other specified complication (HCC) 01/28/2011   THYROID  NODULE 01/05/2010   Unspecified vitamin D  deficiency 01/05/2010   Acute bronchitis 10/14/2009   KNEE, ARTHRITIS, DEGEN./OSTEO 05/01/2009   Dyslipidemia 11/11/2008   Anemia in chronic kidney disease 11/11/2008   Essential hypertension 06/14/2008    Past Surgical History:  Procedure Laterality Date   ANTERIOR CERVICAL DECOMP/DISCECTOMY FUSION N/A 09/15/2021   Procedure: Anterior Cervical Decompression Discectomy Fusion Cervical three-four, Cervical four-five;  Surgeon: Debby Dorn MATSU, MD;  Location: [facility] OR;  Service: Neurosurgery;  Laterality: N/A;   BONE MARROW ASPIRATION Left 05/16/15   BONE MARROW BIOPSY Left 05/16/15   CATARACT EXTRACTION W/PHACO Right 02/21/2023   Procedure: CATARACT EXTRACTION PHACO AND INTRAOCULAR LENS PLACEMENT (IOC);  Surgeon: Harrie Agent, MD;  Location: [facility];  Service: Ophthalmology;  Laterality: Right;  CDE: 7.42   CATARACT EXTRACTION W/PHACO Left 03/07/2023   Procedure: CATARACT EXTRACTION PHACO AND INTRAOCULAR LENS PLACEMENT (IOC);  Surgeon: Harrie Agent, MD;  Location: [facility];  Service: Ophthalmology;  Laterality: Left;  CDE: 8.10   COLONOSCOPY  03/30/2012   Procedure: COLONOSCOPY;  Surgeon: Claudis RAYMOND Rivet, MD;  Location: [facility];  Service: Endoscopy;  Laterality: N/A;  730   COLONOSCOPY N/A 06/30/2017  Procedure: COLONOSCOPY;  Surgeon: Golda Claudis PENNER, MD;  Location: [facility];  Service: Endoscopy;  Laterality: N/A;  2:00   COLONOSCOPY WITH PROPOFOL  N/A 06/14/2023   Procedure: COLONOSCOPY WITH PROPOFOL ;  Surgeon: Cinderella Deatrice FALCON, MD;  Location: [facility];  Service: Endoscopy;  Laterality: N/A;   HEMORRHOID SURGERY N/A 01/05/2024   Procedure: HEMORRHOIDECTOMY;  Surgeon:  Sheldon Standing, MD;  Location: [facility];  Service: General;  Laterality: N/A;  HEMORRHOIDECTOMY WITH LIGATION & HEMORRHOIDOPEXY   LAMINECTOMY  1980's   POLYPECTOMY  06/30/2017   Procedure: POLYPECTOMY;  Surgeon: Golda Claudis PENNER, MD;  Location: [facility];  Service: Endoscopy;;  colon   POLYPECTOMY  06/14/2023   Procedure: POLYPECTOMY;  Surgeon: Cinderella Deatrice FALCON, MD;  Location: [facility];  Service: Endoscopy;;   RECTAL EXAM UNDER ANESTHESIA N/A 01/05/2024   Procedure: EXAM UNDER ANESTHESIA, RECTUM;  Surgeon: Sheldon Standing, MD;  Location: [facility];  Service: General;  Laterality: N/A;   SPINE SURGERY  approx 1983   dr leeann   TOTAL ABDOMINAL HYSTERECTOMY  approx 1993   fibroids     OB History   No obstetric history on file.      Home Medications    Prior to Admission medications  Medication Sig Start Date End Date Taking? Authorizing Provider  gabapentin  (NEURONTIN ) 100 MG capsule Take 1 capsule (100 mg total) by mouth 3 (three) times daily. 06/29/24  Yes Leath-Warren, Etta PARAS, NP  valACYclovir (VALTREX) 1000 MG tablet Take 1 tablet (1,000 mg total) by mouth 3 (three) times daily. 06/29/24  Yes Leath-Warren, Etta PARAS, NP  acetaminophen  (TYLENOL ) 650 MG CR tablet Take 650 mg by mouth every 8 (eight) hours as needed for pain.     [provider]  amLODipine  (NORVASC ) 10 MG tablet TAKE 1 TABLET BY MOUTH DAILY. 10/18/23   Antonetta Rollene BRAVO, MD  aspirin 81 MG EC tablet Take 81 mg by mouth daily. 07/01/17   Rehman, Claudis PENNER, MD  benazepril  (LOTENSIN ) 5 MG tablet Take 1 tablet (5 mg total) by mouth daily. 10/20/22   Antonetta Rollene BRAVO, MD  buPROPion  (WELLBUTRIN  XL) 150 MG 24 hr tablet TAKE ONE TABLET BY MOUTH ONCE DAILY. 01/23/24   Antonetta Rollene BRAVO, MD  calcitRIOL (ROCALTROL) 0.25 MCG capsule Take 0.25 mcg by mouth every Monday, Wednesday, and Friday. 02/09/21   [provider]  Calcium-Magnesium-Vitamin D  (CALCIUM 1200+D3 PO) Take 1 tablet by mouth 2 (two) times daily.     [provider]  diphenhydrAMINE (BENADRYL) 25 mg capsule Take 25 mg by mouth at bedtime as needed for allergies or sleep.    [provider]  hydrochlorothiazide  (HYDRODIURIL ) 25 MG tablet TAKE 1 TABLET BY MOUTH DAILY. 01/04/24   Antonetta Rollene BRAVO, MD  levothyroxine  (SYNTHROID ) 88 MCG tablet TAKE 1 TABLET BY MOUTH ONCE DAILY. 1/2 HOUR BEFORE FIRST MEAL OF THE DAY. TAKE WITH WATER . 12/02/23   Antonetta Rollene BRAVO, MD  Misc Natural Products (JOINT HEALTH PO) Take 1 capsule by mouth daily.    [provider]  Multiple Vitamin (MULTIVITAMIN WITH MINERALS) TABS tablet Take 1 tablet by mouth daily.    [provider]  omeprazole  (PRILOSEC) 20 MG capsule Take 1 capsule (20 mg total) by mouth daily as needed (for acid reflux.). 06/29/22   Golda Lynwood PARAS, MD  oxyCODONE  (OXY IR/ROXICODONE ) 5 MG immediate release tablet Take 1 tablet (5 mg total) by mouth every 6 (six) hours as needed for severe pain (pain score 7-10) or  breakthrough pain. 01/05/24   Sheldon Standing, MD  pravastatin  (PRAVACHOL ) 20 MG tablet TAKE ONE TABLET BY MOUTH IN THE EVENING. 09/19/23   Antonetta Rollene BRAVO, MD    Family History Family History  Problem Relation Age of Onset   Heart attack Mother 95   Heart disease Mother    Cancer Mother        type unknown   Diabetes Mother    Kidney failure Father    Kidney disease Father    Myasthenia gravis Brother    Diabetes Sister    Breast cancer Sister 52   Heart disease Brother    Diabetes Sister    Sickle cell anemia Sister    Cancer Sister        unknown type    Social History Social History[1]   Allergies   Patient has no known allergies.   Review of Systems Review of Systems Per HPI  Physical Exam Triage Vital Signs ED Triage Vitals  Encounter Vitals Group     BP 06/29/24 0858 (!) 154/82     Girls Systolic BP Percentile --      Girls Diastolic BP Percentile --      Boys Systolic BP Percentile --      Boys Diastolic BP Percentile --       Pulse Rate 06/29/24 0858 84     Resp 06/29/24 0858 20     Temp 06/29/24 0858 97.8 F (36.6 C)     Temp Source 06/29/24 0858 Oral     SpO2 06/29/24 0858 97 %     Weight --      Height --      Head Circumference --      Peak Flow --      Pain Score 06/29/24 0902 5     Pain Loc --      Pain Education --      Exclude from Growth Chart --    No data found.  Updated Vital Signs BP (!) 154/82 (BP Location: Right Arm)   Pulse 84   Temp 97.8 F (36.6 C) (Oral)   Resp 20   SpO2 97%   Visual Acuity Right Eye Distance:   Left Eye Distance:   Bilateral Distance:    Right Eye Near:   Left Eye Near:    Bilateral Near:     Physical Exam Vitals and nursing note reviewed.  Constitutional:      General: She is not in acute distress.    Appearance: Normal appearance.  HENT:     Head: Normocephalic.  Eyes:     Extraocular Movements: Extraocular movements intact.     Pupils: Pupils are equal, round, and reactive to light.  Cardiovascular:     Rate and Rhythm: Normal rate and regular rhythm.     Pulses: Normal pulses.     Heart sounds: Normal heart sounds.  Pulmonary:     Effort: Pulmonary effort is normal. No respiratory distress.     Breath sounds: Normal breath sounds. No stridor. No wheezing, rhonchi or rales.  Musculoskeletal:     Cervical back: Normal range of motion.  Skin:    General: Skin is warm and dry.     Findings: Erythema and rash present. Rash is vesicular.  Neurological:     General: No focal deficit present.     Mental Status: She is alert and oriented to person, place, and time.  Psychiatric:        Mood and Affect: Mood normal.  Behavior: Behavior normal.      UC Treatments / Results  Labs (all labs ordered are listed, but only abnormal results are displayed) Labs Reviewed - No data to display  EKG   Radiology No results found.  Procedures Procedures (including critical care time)  Medications Ordered in UC Medications - No  data to display  Initial Impression / Assessment and Plan / UC Course  I have reviewed the triage vital signs and the nursing notes.  Pertinent labs & imaging results that were available during my care of the patient were reviewed by me and considered in my medical decision making (see chart for details).  Patient presents for complaints of rash that has been painful for the past 5 days.  On exam, the rash is erythematous and vesicular, and localized under the left breast.  Symptoms are consistent with shingles.  Treat with valacyclovir 1000 mg 3 times daily and gabapentin  100 mg.  Patient was advised that treatment is usually recommended within the first 72 hours.  Supportive care recommendations were provided and discussed with the patient to include over-the-counter analgesics, cool compresses to the area, and to keep the area covered if it begins to drain.  Discussed indications with the patient regarding follow-up.  Patient was in agreement with this plan of care and verbalizes understanding.  All questions were answered.  Patient stable for discharge.  Final Clinical Impressions(s) / UC Diagnoses   Final diagnoses:  Herpes zoster without complication     Discharge Instructions      Take medication as prescribed. May take over-the-counter Tylenol  as needed for pain, fever, general discomfort. Increase fluids and allow for plenty of rest. May cover the areas on your chest with cool cloths to help with itching, pain, or discomfort. Do not scratch or irritate the areas while symptoms persist. If the areas begin to ooze or blister, keep them covered. Follow-up with your primary care physician if symptoms fail to improve with this treatment. Follow-up as needed.      ED Prescriptions     Medication Sig Dispense Auth. Provider   valACYclovir (VALTREX) 1000 MG tablet Take 1 tablet (1,000 mg total) by mouth 3 (three) times daily. 21 tablet Leath-Warren, Etta PARAS, NP   gabapentin   (NEURONTIN ) 100 MG capsule Take 1 capsule (100 mg total) by mouth 3 (three) times daily. 30 capsule Leath-Warren, Etta PARAS, NP      PDMP not reviewed this encounter.    [1]  Social History Tobacco Use   Smoking status: Never   Smokeless tobacco: Never  Vaping Use   Vaping status: Never Used  Substance Use Topics   Alcohol use: No   Drug use: No     Gilmer Etta PARAS, NP 06/29/24 0919  "

## 2024-06-29 NOTE — ED Triage Notes (Signed)
 Pt reports rash under left breast that is burning x 5 days. Pain and discomfort has gotten worse over the week, pt has tried several OTC meds but has found no relief.

## 2024-06-29 NOTE — Discharge Instructions (Signed)
 Take medication as prescribed. May take over-the-counter Tylenol  as needed for pain, fever, general discomfort. Increase fluids and allow for plenty of rest. May cover the areas on your chest with cool cloths to help with itching, pain, or discomfort. Do not scratch or irritate the areas while symptoms persist. If the areas begin to ooze or blister, keep them covered. Follow-up with your primary care physician if symptoms fail to improve with this treatment. Follow-up as needed.

## 2024-07-22 ENCOUNTER — Other Ambulatory Visit: Payer: Self-pay | Admitting: Family Medicine

## 2024-07-31 ENCOUNTER — Encounter: Payer: Self-pay | Admitting: Family Medicine

## 2024-07-31 ENCOUNTER — Ambulatory Visit: Admitting: Family Medicine

## 2024-07-31 ENCOUNTER — Other Ambulatory Visit (HOSPITAL_COMMUNITY): Payer: Self-pay | Admitting: Family Medicine

## 2024-07-31 ENCOUNTER — Telehealth: Payer: Self-pay | Admitting: Family Medicine

## 2024-07-31 VITALS — BP 155/79 | HR 92 | Resp 16 | Ht 66.0 in | Wt 180.0 lb

## 2024-07-31 DIAGNOSIS — E039 Hypothyroidism, unspecified: Secondary | ICD-10-CM | POA: Diagnosis not present

## 2024-07-31 DIAGNOSIS — I10 Essential (primary) hypertension: Secondary | ICD-10-CM | POA: Diagnosis not present

## 2024-07-31 DIAGNOSIS — I050 Rheumatic mitral stenosis: Secondary | ICD-10-CM | POA: Insufficient documentation

## 2024-07-31 DIAGNOSIS — E559 Vitamin D deficiency, unspecified: Secondary | ICD-10-CM | POA: Diagnosis not present

## 2024-07-31 DIAGNOSIS — I342 Nonrheumatic mitral (valve) stenosis: Secondary | ICD-10-CM

## 2024-07-31 DIAGNOSIS — I5189 Other ill-defined heart diseases: Secondary | ICD-10-CM | POA: Insufficient documentation

## 2024-07-31 DIAGNOSIS — Z9189 Other specified personal risk factors, not elsewhere classified: Secondary | ICD-10-CM | POA: Diagnosis not present

## 2024-07-31 DIAGNOSIS — E663 Overweight: Secondary | ICD-10-CM | POA: Diagnosis not present

## 2024-07-31 DIAGNOSIS — I350 Nonrheumatic aortic (valve) stenosis: Secondary | ICD-10-CM | POA: Diagnosis not present

## 2024-07-31 DIAGNOSIS — E785 Hyperlipidemia, unspecified: Secondary | ICD-10-CM | POA: Diagnosis not present

## 2024-07-31 DIAGNOSIS — Z780 Asymptomatic menopausal state: Secondary | ICD-10-CM | POA: Insufficient documentation

## 2024-07-31 DIAGNOSIS — E1169 Type 2 diabetes mellitus with other specified complication: Secondary | ICD-10-CM | POA: Diagnosis not present

## 2024-07-31 DIAGNOSIS — F322 Major depressive disorder, single episode, severe without psychotic features: Secondary | ICD-10-CM

## 2024-07-31 DIAGNOSIS — F324 Major depressive disorder, single episode, in partial remission: Secondary | ICD-10-CM | POA: Diagnosis not present

## 2024-07-31 DIAGNOSIS — Z1231 Encounter for screening mammogram for malignant neoplasm of breast: Secondary | ICD-10-CM

## 2024-07-31 MED ORDER — BUPROPION HCL ER (XL) 150 MG PO TB24: 150.0000 mg | ORAL_TABLET | Freq: Every day | ORAL | 2 refills | Status: AC

## 2024-07-31 MED ORDER — CARVEDILOL 6.25 MG PO TABS: 6.2500 mg | ORAL_TABLET | Freq: Two times a day (BID) | ORAL | 3 refills | Status: AC

## 2024-07-31 MED ORDER — OMEPRAZOLE 20 MG PO CPDR: 20.0000 mg | DELAYED_RELEASE_CAPSULE | Freq: Every day | ORAL | 1 refills | Status: AC | PRN

## 2024-07-31 NOTE — Progress Notes (Signed)
 "  Renee Collins     MRN: [id number]      DOB: [birth year]  Chief Complaint  Patient presents with   Hypertension    HPI Renee Collins is here for follow up and re-evaluation of chronic medical conditions, medication management and review of any available recent lab and radiology data.  Preventive health is updated, specifically  Cancer screening and Immunization.   Treated for shingles under left breast Jan 2026, rash now dried and pain resolved. The PT denies any adverse reactions to current medications since the last visit.  There are no new concerns.  There are no specific complaints   ROS Denies recent fever or chills. Denies sinus pressure, nasal congestion, ear pain or sore throat. Denies chest congestion, productive cough or wheezing. Denies chest pains, palpitations and leg swelling Denies abdominal pain, nausea, vomiting,diarrhea or constipation.   Denies dysuria, frequency, hesitancy or incontinence. Denies joint pain, swelling and limitation in mobility. Denies headaches, seizures, numbness, or tingling. Denies depression, anxiety or insomnia. Denies skin break down or rash.   PE  BP (!) 155/79   Pulse 92   Resp 16   Ht 5' 6 (1.676 m)   Wt 180 lb (81.6 kg)   SpO2 100%   BMI 29.05 kg/m   Patient alert and oriented and in no cardiopulmonary distress.  HEENT: No facial asymmetry, EOMI,     Neck supple .  Chest: Clear to auscultation bilaterally.  CVS: S1, S2 systolic murmurs, no S3.Regular rate.  ABD: Soft non tender.   Ext: No edema  MS: Adequate ROM spine, shoulders, hips and knees.  Skin: Intact, no ulcerations or rash noted.  Psych: Good eye contact, flat  affect. Memory intact not anxious mildly  depressed appearing.  CNS: CN 2-12 intact, power,  normal throughout.no focal deficits noted.   Assessment & Plan  Type 2 diabetes mellitus with other specified complication (HCC) Diabetes associated with hypertension and hyperlipidemia  Renee Collins is  reminded of the importance of commitment to daily physical activity for 30 minutes or more, as able and the need to limit carbohydrate intake to 30 to 60 grams per meal to help with blood sugar control.   The need to take medication as prescribed, test blood sugar as directed, and to call between visits if there is a concern that blood sugar is uncontrolled is also discussed.   Renee Collins is reminded of the importance of daily foot exam, annual eye examination, and good blood sugar, blood pressure and cholesterol control.     Latest Ref Rng & Units 05/02/2024   11:30 AM 01/12/2024   10:39 AM 12/28/2023   11:08 AM 10/27/2023   10:30 AM 09/07/2023    9:48 AM  Diabetic Labs  HbA1c 4.8 - 5.6 %    6.3    Chol 100 - 199 mg/dL    851    HDL >60 mg/dL    55    Calc LDL 0 - 99 mg/dL    79    Triglycerides 0 - 149 mg/dL    74    Creatinine 9.55 - 1.00 mg/dL 8.14  6.72  7.94  7.97  1.91       07/31/2024   10:00 AM 06/29/2024    8:58 AM 05/09/2024   11:00 AM 01/19/2024   11:25 AM 01/05/2024    6:10 PM 01/05/2024    4:11 PM 01/05/2024    4:00 PM  BP/Weight  Systolic BP 155 154 132 131 156  131 122  Diastolic BP 79 82 80 70 83 86 65  Wt. (Lbs) 180  175.49 176.15     BMI 29.05 kg/m2  28.32 kg/m2 28.43 kg/m2         Latest Ref Rng & Units 07/31/2024   10:00 AM 12/08/2022   12:00 AM  Foot/eye exam completion dates  Eye Exam No Retinopathy  No Retinopathy      Foot Form Completion  Done      This result is from an external source.    Updated lab needed    Diastolic dysfunction Cardiology eval and needs repr echo for valvular disease  Mitral stenosis Refer cardiology fdor eval  Aortic stenosis, mild Refer cardiology for re evaluation  At increased risk for cardiovascular disease Refder for cardiology eval  Essential hypertension Uncontrolled add coreg  6.125 mg twice daily and re eval in 6 weeks DASH diet and commitment to daily physical activity for a minimum of 30 minutes discussed and  encouraged, as a part of hypertension management. The importance of attaining a healthy weight is also discussed.     07/31/2024   10:00 AM 06/29/2024    8:58 AM 05/09/2024   11:00 AM 01/19/2024   11:25 AM 01/05/2024    6:10 PM 01/05/2024    4:11 PM 01/05/2024    4:00 PM  BP/Weight  Systolic BP 155 154 132 131 156 131 122  Diastolic BP 79 82 80 70 83 86 65  Wt. (Lbs) 180  175.49 176.15     BMI 29.05 kg/m2  28.32 kg/m2 28.43 kg/m2          Dyslipidemia Hyperlipidemia:Low fat diet discussed and encouraged.   Lipid Panel  Lab Results  Component Value Date   CHOL 148 10/27/2023   HDL 55 10/27/2023   LDLCALC 79 10/27/2023   TRIG 74 10/27/2023   CHOLHDL 2.7 10/27/2023     Updated lab needed .   Depression, major, single episode, severe (HCC) Controlled, no change in medication   Hypothyroid Updated lab needed   Overweight (BMI 25.0-29.9)  Patient re-educated about  the importance of commitment to a  minimum of 150 minutes of exercise per week as able.  The importance of healthy food choices with portion control discussed, as well as eating regularly and within a 12 hour window most days. The need to choose clean , green food 50 to 75% of the time is discussed, as well as to make water  the primary drink and set a goal of 64 ounces water  daily.       07/31/2024   10:00 AM 05/09/2024   11:00 AM 01/19/2024   11:25 AM  Weight /BMI  Weight 180 lb 175 lb 7.8 oz 176 lb 2.4 oz  Height 5' 6 (1.676 m)    BMI 29.05 kg/m2 28.32 kg/m2 28.43 kg/m2    stable  Unspecified vitamin D  deficiency Updated lab needed .  "

## 2024-07-31 NOTE — Telephone Encounter (Signed)
 Pls call pt and let her know I have referred her to Cardiology Her last echo over 3 years ago showed aortic and mitral valves are a bit tigth ( stenosis) as well heart a bit still, needs Cardiology to check her heart , I have entered the referral for [place] [facility] [place] , let me know if any concerns/ questions, she should expect call for appt form Cardiology Thank you!

## 2024-07-31 NOTE — Assessment & Plan Note (Signed)
 Controlled, no change in medication

## 2024-07-31 NOTE — Assessment & Plan Note (Addendum)
 Diabetes associated with hypertension and hyperlipidemia  [name] is reminded of the importance of commitment to daily physical activity for 30 minutes or more, as able and the need to limit carbohydrate intake to 30 to 60 grams per meal to help with blood sugar control.   The need to take medication as prescribed, test blood sugar as directed, and to call between visits if there is a concern that blood sugar is uncontrolled is also discussed.   [name] is reminded of the importance of daily foot exam, annual eye examination, and good blood sugar, blood pressure and cholesterol control.     Latest Ref Rng & Units 05/02/2024   11:30 AM 01/12/2024   10:39 AM 12/28/2023   11:08 AM 10/27/2023   10:30 AM 09/07/2023    9:48 AM  Diabetic Labs  HbA1c 4.8 - 5.6 %    6.3    Chol 100 - 199 mg/dL    851    HDL >60 mg/dL    55    Calc LDL 0 - 99 mg/dL    79    Triglycerides 0 - 149 mg/dL    74    Creatinine 9.55 - 1.00 mg/dL 8.14  6.72  7.94  7.97  1.91       07/31/2024   10:00 AM 06/29/2024    8:58 AM 05/09/2024   11:00 AM 01/19/2024   11:25 AM 01/05/2024    6:10 PM 01/05/2024    4:11 PM 01/05/2024    4:00 PM  BP/Weight  Systolic BP 155 154 132 131 156 131 122  Diastolic BP 79 82 80 70 83 86 65  Wt. (Lbs) 180  175.49 176.15     BMI 29.05 kg/m2  28.32 kg/m2 28.43 kg/m2         Latest Ref Rng & Units 07/31/2024   10:00 AM 12/08/2022   12:00 AM  Foot/eye exam completion dates  Eye Exam No Retinopathy  No Retinopathy      Foot Form Completion  Done      This result is from an external source.    Updated lab needed

## 2024-07-31 NOTE — Assessment & Plan Note (Signed)
 Updated lab needed.

## 2024-07-31 NOTE — Assessment & Plan Note (Signed)
" °  Patient re-educated about  the importance of commitment to a  minimum of 150 minutes of exercise per week as able.  The importance of healthy food choices with portion control discussed, as well as eating regularly and within a 12 hour window most days. The need to choose clean , green food 50 to 75% of the time is discussed, as well as to make water  the primary drink and set a goal of 64 ounces water  daily.       07/31/2024   10:00 AM 05/09/2024   11:00 AM 01/19/2024   11:25 AM  Weight /BMI  Weight 180 lb 175 lb 7.8 oz 176 lb 2.4 oz  Height 5' 6 (1.676 m)    BMI 29.05 kg/m2 28.32 kg/m2 28.43 kg/m2    stable "

## 2024-07-31 NOTE — Assessment & Plan Note (Signed)
 Cardiology eval and needs repr echo for valvular disease

## 2024-07-31 NOTE — Assessment & Plan Note (Signed)
 Refder for cardiology eval

## 2024-07-31 NOTE — Assessment & Plan Note (Signed)
 Refer cardiology for re evaluation

## 2024-07-31 NOTE — Patient Instructions (Addendum)
 Annual in 5 months  F/U with MD in5 weeks re evaluate blood pressure  New additional medication fior blood pressure is carvedilol   6.25 mg take one at 8 am and one at 8 pm  Labs today lipid, cmp and EGFr, HBA1C, TSH and vit D and urine ACR  Please schedule appointments for bone density and mammogram at checkout , both are past due  Foot exam today Addendum: patient is referred to Cardiology to re evaluate  valvular disease, diastolic dysfunction and cardiovascular risk    It is important that you exercise regularly at least 30 minutes 5 times a week. If you develop chest pain, have severe difficulty breathing, or feel very tired, stop exercising immediately and seek medical attention   Think about what you will eat, plan ahead. Choose  clean, green, fresh or frozen over canned, processed or packaged foods which are more sugary, salty and fatty. 70 to 75% of food eaten should be vegetables and fruit. Three meals at set times with snacks allowed between meals, but they must be fruit or vegetables. Aim to eat over a 12 hour period , example 7 am to 7 pm, and STOP after  your last meal of the day. Drink water ,generally about 64 ounces per day, no other drink is as healthy. Fruit juice is best enjoyed in a healthy way, by EATING the fruit.   Thanks for choosing [facility], we consider it a privelige to serve you.

## 2024-07-31 NOTE — Assessment & Plan Note (Signed)
 Hyperlipidemia:Low fat diet discussed and encouraged.   Lipid Panel  Lab Results  Component Value Date   CHOL 148 10/27/2023   HDL 55 10/27/2023   LDLCALC 79 10/27/2023   TRIG 74 10/27/2023   CHOLHDL 2.7 10/27/2023     Updated lab needed .

## 2024-08-01 ENCOUNTER — Other Ambulatory Visit: Payer: Self-pay

## 2024-08-01 ENCOUNTER — Ambulatory Visit: Payer: Self-pay | Admitting: Family Medicine

## 2024-08-01 DIAGNOSIS — E1169 Type 2 diabetes mellitus with other specified complication: Secondary | ICD-10-CM

## 2024-08-01 DIAGNOSIS — I10 Essential (primary) hypertension: Secondary | ICD-10-CM

## 2024-08-01 DIAGNOSIS — E039 Hypothyroidism, unspecified: Secondary | ICD-10-CM

## 2024-08-01 MED ORDER — LEVOTHYROXINE SODIUM 88 MCG PO TABS: ORAL_TABLET | ORAL | 1 refills | Status: AC

## 2024-08-01 NOTE — Progress Notes (Unsigned)
 "  Cardiology Office Note    Date:  08/02/2024  ID:  Renee, Collins 1940/06/29, MRN [id number] Cardiologist: Maude Emmer, MD { : History of Present Illness:    Renee Collins is a 84 y.o. female with past medical history of HTN, HLD, Type 2 DM, hypothyroidism and valvular heart disease (echo in 07/2021 showed mild MS and mild AS) who presents to the office today for overdue annual follow-up.  She was last examined by Dr. Wendel (DOD) in 07/2021 and she needed preoperative cardiac clearance for cervical spine surgery. She was walking 2 miles several days a week prior to COVID and reported still being able to do ADL's without any anginal symptoms. Was cleared to proceed with her surgery at that time. She did have a follow-up echocardiogram later that month which showed a preserved EF of 65 to 70% with grade 1 diastolic dysfunction, normal RV function, mild MS and mild AS.  In talking with the patient today, she reports overall doing well since her last office visit. She remains active in doing chores around her home and walks outside when the weather permits or at [facility] for exercise. She denies any recent chest pain or dyspnea on exertion. No recent palpitations, orthopnea, PND or pitting edema. Says that her blood pressure was elevated previously and she was evaluated by her PCP earlier this week and started on Coreg  6.25 mg twice daily. Says that she was previously having issues with her kidneys but this had improved as well based off recent labs (creatinine was previously 3.27 in 12/2023 and at 1.84 by most recent check on 07/31/2024).  Studies Reviewed:   EKG: EKG is ordered today and demonstrates:   EKG Interpretation Date/Time:  Thursday August 02 2024 11:17:18 EST Ventricular Rate:  72 PR Interval:  174 QRS Duration:  94 QT Interval:  390 QTC Calculation: 427 R Axis:   43  Text Interpretation: Normal sinus rhythm Minimal voltage criteria for LVH, may be normal variant ( [employer]  product ) When compared with ECG of 06-Jun-2023 14:20, No significant change was found Confirmed by Johnson Grate ([id number]) on 08/02/2024 11:27:01 AM       Echocardiogram: 07/2021 IMPRESSIONS     1. Left ventricular ejection fraction, by estimation, is 65 to 70%. The  left ventricle has normal function. Left ventricular endocardial border  not optimally defined to evaluate regional wall motion. There is mild left  ventricular hypertrophy. Left  ventricular diastolic parameters are consistent with Grade I diastolic  dysfunction (impaired relaxation).   2. Right ventricular systolic function is normal. The right ventricular  size is normal. There is normal pulmonary artery systolic pressure.   3. The mitral valve is abnormal. No evidence of mitral valve  regurgitation. Mild mitral stenosis.Mean gradient 4 mmHg. Severe mitral  annular calcification.   4. The aortic valve was not well visualized. Aortic valve regurgitation  is not visualized. Mild aortic valve stenosis. Aortic valve mean gradient  measures 8.7 mmHg. Aortic valve peak gradient measures 17.4 mmHg. Aortic  valve area, by VTI measures 1.54  cm.   5. The inferior vena cava is normal in size with greater than 50%  respiratory variability, suggesting right atrial pressure of 3 mmHg.    Physical Exam:   VS:  BP 128/82   Pulse 72   Ht 5' 6 (1.676 m)   Wt 179 lb 9.6 oz (81.5 kg)   SpO2 99%   BMI 28.99 kg/m    Wt Readings from  Last 3 Encounters:  08/02/24 179 lb 9.6 oz (81.5 kg)  07/31/24 180 lb (81.6 kg)  05/09/24 175 lb 7.8 oz (79.6 kg)     GEN: Pleasant female appearing in no acute distress NECK: No JVD; No carotid bruits CARDIAC: RRR, 2/6 SEM along RUSB.  RESPIRATORY:  Clear to auscultation without rales, wheezing or rhonchi  ABDOMEN: Appears non-distended. No obvious abdominal masses. EXTREMITIES: No clubbing or cyanosis. No pitting edema.  Distal pedal pulses are 2+ bilaterally.   Assessment and Plan:    1. Aortic stenosis/Mitral stenosis - Most recent echocardiogram in 07/2021 showed a preserved EF with mild LVH, grade 1 diastolic dysfunction, normal RV function, mild MS and mild AS. Will arrange for a follow-up echocardiogram for reassessment. If her stenosis remains within a mild range, anticipate repeat imaging in 3 years for reassessment.  2. Essential hypertension - Her blood pressure was initially recorded at 138/80, rechecked and improved to 128/82. Continue current medical therapy for now with Amlodipine  10 mg daily, Benazepril  5 mg daily, HCTZ 25 mg daily and Coreg  6.25 mg twice daily (started on Coreg  earlier this week).  3. Dyslipidemia - Recent FLP earlier this month showed total cholesterol 160, triglycerides 54, HDL 75 and LDL 74. AST 24 and ALT 19. Continue Pravastatin  20 mg daily.  Signed, Laymon CHRISTELLA Qua, PA-C   "

## 2024-08-01 NOTE — Telephone Encounter (Signed)
 N/a, sent mssg since recently active on mychart

## 2024-08-01 NOTE — Telephone Encounter (Signed)
 Please review lab result with pt, she has a dose change in the synthroid , ensure she understands I have sent new rx to pharmacy  Needs non fasting TSH, hBA1C and bmp 3 to 5 days before her f/u in the Summer, thanks

## 2024-08-02 ENCOUNTER — Ambulatory Visit: Admitting: Student

## 2024-08-02 ENCOUNTER — Encounter: Payer: Self-pay | Admitting: Student

## 2024-08-02 VITALS — BP 128/82 | HR 72 | Ht 66.0 in | Wt 179.6 lb

## 2024-08-02 DIAGNOSIS — I350 Nonrheumatic aortic (valve) stenosis: Secondary | ICD-10-CM

## 2024-08-02 DIAGNOSIS — I050 Rheumatic mitral stenosis: Secondary | ICD-10-CM

## 2024-08-02 DIAGNOSIS — I342 Nonrheumatic mitral (valve) stenosis: Secondary | ICD-10-CM

## 2024-08-02 DIAGNOSIS — E785 Hyperlipidemia, unspecified: Secondary | ICD-10-CM | POA: Diagnosis not present

## 2024-08-02 DIAGNOSIS — I10 Essential (primary) hypertension: Secondary | ICD-10-CM | POA: Diagnosis not present

## 2024-08-02 LAB — HEMOGLOBIN A1C
Est. average glucose Bld gHb Est-mCnc: 134 mg/dL
Hgb A1c MFr Bld: 6.3 % — ABNORMAL HIGH (ref 4.8–5.6)

## 2024-08-02 LAB — MICROALBUMIN / CREATININE URINE RATIO
Creatinine, Urine: 147.8 mg/dL
Microalb/Creat Ratio: 284 mg/g{creat} — AB (ref 0–29)
Microalbumin, Urine: 419.5 ug/mL

## 2024-08-02 LAB — CMP14+EGFR
ALT: 19 [IU]/L (ref 0–32)
AST: 24 [IU]/L (ref 0–40)
Albumin: 4.2 g/dL (ref 3.7–4.7)
Alkaline Phosphatase: 86 [IU]/L (ref 48–129)
BUN/Creatinine Ratio: 14 (ref 12–28)
BUN: 25 mg/dL (ref 8–27)
Bilirubin Total: 0.6 mg/dL (ref 0.0–1.2)
CO2: 20 mmol/L (ref 20–29)
Calcium: 10.2 mg/dL (ref 8.7–10.3)
Chloride: 106 mmol/L (ref 96–106)
Creatinine, Ser: 1.84 mg/dL — ABNORMAL HIGH (ref 0.57–1.00)
Globulin, Total: 4.2 g/dL (ref 1.5–4.5)
Glucose: 97 mg/dL (ref 70–99)
Potassium: 3.9 mmol/L (ref 3.5–5.2)
Sodium: 140 mmol/L (ref 134–144)
Total Protein: 8.4 g/dL (ref 6.0–8.5)
eGFR: 27 mL/min/{1.73_m2} — ABNORMAL LOW

## 2024-08-02 LAB — LIPID PANEL
Chol/HDL Ratio: 2.1 ratio (ref 0.0–4.4)
Cholesterol, Total: 160 mg/dL (ref 100–199)
HDL: 75 mg/dL
LDL Chol Calc (NIH): 74 mg/dL (ref 0–99)
Triglycerides: 54 mg/dL (ref 0–149)
VLDL Cholesterol Cal: 11 mg/dL (ref 5–40)

## 2024-08-02 LAB — VITAMIN D 25 HYDROXY (VIT D DEFICIENCY, FRACTURES): Vit D, 25-Hydroxy: 34.2 ng/mL (ref 30.0–100.0)

## 2024-08-02 LAB — TSH: TSH: 5.31 u[IU]/mL — ABNORMAL HIGH (ref 0.450–4.500)

## 2024-08-02 NOTE — Patient Instructions (Signed)
 Medication Instructions:   Continue current medication regimen.   *If you need a refill on your cardiac medications before your next appointment, please call your pharmacy*   Testing/Procedures: Your physician has requested that you have an echocardiogram. Echocardiography is a painless test that uses sound waves to create images of your heart. It provides your doctor with information about the size and shape of your heart and how well your hearts chambers and valves are working. This procedure takes approximately one hour. There are no restrictions for this procedure. Please do NOT wear cologne, perfume, aftershave, or lotions (deodorant is allowed). Please arrive 15 minutes prior to your appointment time.  Please note: We ask at that you not bring children with you during ultrasound (echo/ vascular) testing. Due to room size and safety concerns, children are not allowed in the ultrasound rooms during exams. Our front office staff cannot provide observation of children in our lobby area while testing is being conducted. An adult accompanying a patient to their appointment will only be allowed in the ultrasound room at the discretion of the ultrasound technician under special circumstances. We apologize for any inconvenience.   Follow-Up: At [facility], you and your health needs are our priority.  As part of our continuing mission to provide you with exceptional heart care, our providers are all part of one team.  This team includes your primary Cardiologist (physician) and Advanced Practice Providers or APPs (Physician Assistants and Nurse Practitioners) who all work together to provide you with the care you need, when you need it.  Your next appointment:   1 year(s)  Provider:   You may see Maude Emmer, MD or one of the following Advanced Practice Providers on your designated Care Team:   Laymon Qua, PA-C  [place], [place] Olivia [place], [place]     We recommend  signing up for the patient portal called MyChart.  Sign up information is provided on this After Visit Summary.  MyChart is used to connect with patients for Virtual Visits (Telemedicine).  Patients are able to view lab/test results, encounter notes, upcoming appointments, etc.  Non-urgent messages can be sent to your provider as well.   To learn more about what you can do with MyChart, go to [email or web address].   Other Instructions Thank you for choosing [facility]!

## 2024-08-14 ENCOUNTER — Ambulatory Visit: Admitting: Podiatry

## 2024-08-22 ENCOUNTER — Ambulatory Visit (HOSPITAL_COMMUNITY)

## 2024-08-22 ENCOUNTER — Other Ambulatory Visit (HOSPITAL_COMMUNITY)

## 2024-09-04 ENCOUNTER — Ambulatory Visit: Payer: Self-pay

## 2024-09-07 ENCOUNTER — Other Ambulatory Visit (HOSPITAL_COMMUNITY)

## 2024-09-10 ENCOUNTER — Ambulatory Visit

## 2024-09-12 ENCOUNTER — Ambulatory Visit

## 2024-11-06 ENCOUNTER — Inpatient Hospital Stay

## 2024-11-13 ENCOUNTER — Inpatient Hospital Stay: Admitting: Oncology

## 2025-01-09 ENCOUNTER — Encounter: Payer: Self-pay | Admitting: Family Medicine
